# Patient Record
Sex: Female | Born: 1950 | Race: Black or African American | Hispanic: No | Marital: Single | Smoking: Former smoker
Health system: Southern US, Community
[De-identification: ages and names within clinical notes are randomized; demographics above are authoritative.]

## PROBLEM LIST (undated history)

## (undated) DIAGNOSIS — F419 Anxiety disorder, unspecified: Secondary | ICD-10-CM

## (undated) DIAGNOSIS — E669 Obesity, unspecified: Secondary | ICD-10-CM

## (undated) DIAGNOSIS — H269 Unspecified cataract: Secondary | ICD-10-CM

## (undated) DIAGNOSIS — IMO0002 Reserved for concepts with insufficient information to code with codable children: Secondary | ICD-10-CM

## (undated) DIAGNOSIS — Z8659 Personal history of other mental and behavioral disorders: Secondary | ICD-10-CM

## (undated) DIAGNOSIS — N179 Acute kidney failure, unspecified: Secondary | ICD-10-CM

## (undated) DIAGNOSIS — K219 Gastro-esophageal reflux disease without esophagitis: Secondary | ICD-10-CM

## (undated) DIAGNOSIS — E7800 Pure hypercholesterolemia, unspecified: Secondary | ICD-10-CM

## (undated) DIAGNOSIS — R112 Nausea with vomiting, unspecified: Secondary | ICD-10-CM

## (undated) DIAGNOSIS — E1110 Type 2 diabetes mellitus with ketoacidosis without coma: Secondary | ICD-10-CM

## (undated) DIAGNOSIS — M8580 Other specified disorders of bone density and structure, unspecified site: Secondary | ICD-10-CM

## (undated) DIAGNOSIS — R519 Headache, unspecified: Secondary | ICD-10-CM

## (undated) DIAGNOSIS — M1990 Unspecified osteoarthritis, unspecified site: Secondary | ICD-10-CM

## (undated) DIAGNOSIS — I10 Essential (primary) hypertension: Secondary | ICD-10-CM

## (undated) DIAGNOSIS — N6092 Unspecified benign mammary dysplasia of left breast: Secondary | ICD-10-CM

## (undated) DIAGNOSIS — R51 Headache: Secondary | ICD-10-CM

## (undated) DIAGNOSIS — Z9889 Other specified postprocedural states: Secondary | ICD-10-CM

## (undated) HISTORY — PX: CATARACT EXTRACTION, BILATERAL: SHX1313

## (undated) HISTORY — PX: OTHER SURGICAL HISTORY: SHX169

## (undated) HISTORY — PX: ABDOMINAL HYSTERECTOMY: SHX81

## (undated) HISTORY — PX: KNEE JOINT MANIPULATION: SHX386

## (undated) HISTORY — PX: JOINT REPLACEMENT: SHX530

---

## 2013-05-07 DIAGNOSIS — G629 Polyneuropathy, unspecified: Secondary | ICD-10-CM

## 2013-05-07 HISTORY — DX: Polyneuropathy, unspecified: G62.9

## 2013-05-21 ENCOUNTER — Encounter (HOSPITAL_COMMUNITY): Payer: Self-pay | Admitting: Emergency Medicine

## 2013-05-21 ENCOUNTER — Inpatient Hospital Stay (HOSPITAL_COMMUNITY)
Admission: EM | Admit: 2013-05-21 | Discharge: 2013-05-24 | DRG: 638 | Disposition: A | Discharge status: Home Health | Payer: Medicare Other | Attending: Internal Medicine | Admitting: Internal Medicine

## 2013-05-21 DIAGNOSIS — E1310 Other specified diabetes mellitus with ketoacidosis without coma: Secondary | ICD-10-CM

## 2013-05-21 DIAGNOSIS — M129 Arthropathy, unspecified: Secondary | ICD-10-CM | POA: Diagnosis present

## 2013-05-21 DIAGNOSIS — E669 Obesity, unspecified: Secondary | ICD-10-CM | POA: Diagnosis present

## 2013-05-21 DIAGNOSIS — N179 Acute kidney failure, unspecified: Secondary | ICD-10-CM

## 2013-05-21 DIAGNOSIS — E785 Hyperlipidemia, unspecified: Secondary | ICD-10-CM | POA: Diagnosis present

## 2013-05-21 DIAGNOSIS — E876 Hypokalemia: Secondary | ICD-10-CM

## 2013-05-21 DIAGNOSIS — Z87891 Personal history of nicotine dependence: Secondary | ICD-10-CM

## 2013-05-21 DIAGNOSIS — E1110 Type 2 diabetes mellitus with ketoacidosis without coma: Secondary | ICD-10-CM

## 2013-05-21 DIAGNOSIS — M62838 Other muscle spasm: Secondary | ICD-10-CM | POA: Diagnosis present

## 2013-05-21 DIAGNOSIS — K219 Gastro-esophageal reflux disease without esophagitis: Secondary | ICD-10-CM | POA: Diagnosis present

## 2013-05-21 DIAGNOSIS — Z6831 Body mass index (BMI) 31.0-31.9, adult: Secondary | ICD-10-CM

## 2013-05-21 DIAGNOSIS — I10 Essential (primary) hypertension: Secondary | ICD-10-CM

## 2013-05-21 DIAGNOSIS — E871 Hypo-osmolality and hyponatremia: Secondary | ICD-10-CM

## 2013-05-21 DIAGNOSIS — A599 Trichomoniasis, unspecified: Secondary | ICD-10-CM

## 2013-05-21 HISTORY — DX: Gastro-esophageal reflux disease without esophagitis: K21.9

## 2013-05-21 HISTORY — DX: Type 2 diabetes mellitus with ketoacidosis without coma: E11.10

## 2013-05-21 HISTORY — DX: Reserved for concepts with insufficient information to code with codable children: IMO0002

## 2013-05-21 HISTORY — DX: Acute kidney failure, unspecified: N17.9

## 2013-05-21 HISTORY — DX: Essential (primary) hypertension: I10

## 2013-05-21 HISTORY — DX: Unspecified osteoarthritis, unspecified site: M19.90

## 2013-05-21 HISTORY — DX: Pure hypercholesterolemia, unspecified: E78.00

## 2013-05-21 LAB — COMPREHENSIVE METABOLIC PANEL
ALT: 154 U/L — ABNORMAL HIGH (ref 0–35)
AST: 43 U/L — ABNORMAL HIGH (ref 0–37)
Albumin: 4.7 g/dL (ref 3.5–5.2)
Alkaline Phosphatase: 151 U/L — ABNORMAL HIGH (ref 39–117)
BUN: 32 mg/dL — AB (ref 6–23)
CHLORIDE: 75 meq/L — AB (ref 96–112)
CO2: 20 mEq/L (ref 19–32)
Calcium: 9.4 mg/dL (ref 8.4–10.5)
Creatinine, Ser: 1.19 mg/dL — ABNORMAL HIGH (ref 0.50–1.10)
GFR calc Af Amer: 56 mL/min — ABNORMAL LOW (ref >90)
GFR calc non Af Amer: 48 mL/min — ABNORMAL LOW (ref >90)
Glucose, Bld: 863 mg/dL (ref 70–99)
Potassium: 3.8 mEq/L (ref 3.7–5.3)
Sodium: 123 mEq/L — ABNORMAL LOW (ref 137–147)
Total Bilirubin: 0.6 mg/dL (ref 0.3–1.2)
Total Protein: 7.8 g/dL (ref 6.0–8.3)

## 2013-05-21 LAB — BASIC METABOLIC PANEL
BUN: 22 mg/dL (ref 6–23)
BUN: 21 mg/dL (ref 6–23)
BUN: 19 mg/dL (ref 6–23)
CALCIUM: 9 mg/dL (ref 8.4–10.5)
CALCIUM: 9.1 mg/dL (ref 8.4–10.5)
CHLORIDE: 89 meq/L — AB (ref 96–112)
CO2: 23 meq/L (ref 19–32)
CO2: 24 meq/L (ref 19–32)
CO2: 25 mEq/L (ref 19–32)
CREATININE: 0.77 mg/dL (ref 0.50–1.10)
Calcium: 9.1 mg/dL (ref 8.4–10.5)
Chloride: 90 mEq/L — ABNORMAL LOW (ref 96–112)
Chloride: 93 mEq/L — ABNORMAL LOW (ref 96–112)
Creatinine, Ser: 0.86 mg/dL (ref 0.50–1.10)
Creatinine, Ser: 0.74 mg/dL (ref 0.50–1.10)
GFR calc Af Amer: 83 mL/min — ABNORMAL LOW (ref >90)
GFR calc Af Amer: >90 mL/min (ref >90)
GFR calc Af Amer: >90 mL/min (ref >90)
GFR calc non Af Amer: 71 mL/min — ABNORMAL LOW (ref >90)
GFR, EST NON AFRICAN AMERICAN: 89 mL/min — AB (ref >90)
GFR, EST NON AFRICAN AMERICAN: 90 mL/min — AB (ref >90)
GLUCOSE: 142 mg/dL — AB (ref 70–99)
Glucose, Bld: 335 mg/dL — ABNORMAL HIGH (ref 70–99)
Glucose, Bld: 264 mg/dL — ABNORMAL HIGH (ref 70–99)
Potassium: 3.3 mEq/L — ABNORMAL LOW (ref 3.7–5.3)
Potassium: 3.2 mEq/L — ABNORMAL LOW (ref 3.7–5.3)
Potassium: 3.1 mEq/L — ABNORMAL LOW (ref 3.7–5.3)
Sodium: 131 mEq/L — ABNORMAL LOW (ref 137–147)
Sodium: 132 mEq/L — ABNORMAL LOW (ref 137–147)
Sodium: 133 mEq/L — ABNORMAL LOW (ref 137–147)

## 2013-05-21 LAB — URINALYSIS, ROUTINE W REFLEX MICROSCOPIC
Bilirubin Urine: NEGATIVE
Glucose, UA: >1000 mg/dL — AB
Ketones, ur: 40 mg/dL — AB
LEUKOCYTES UA: NEGATIVE
Nitrite: NEGATIVE
PROTEIN: NEGATIVE mg/dL
Specific Gravity, Urine: 1.029 (ref 1.005–1.030)
Urobilinogen, UA: 0.2 mg/dL (ref 0.0–1.0)
pH: 5.5 (ref 5.0–8.0)

## 2013-05-21 LAB — CBC
HEMATOCRIT: 40.2 % (ref 36.0–46.0)
HEMOGLOBIN: 13.8 g/dL (ref 12.0–15.0)
MCH: 29.4 pg (ref 26.0–34.0)
MCHC: 34.3 g/dL (ref 30.0–36.0)
MCV: 85.5 fL (ref 78.0–100.0)
Platelets: 148 10*3/uL — ABNORMAL LOW (ref 150–400)
RBC: 4.7 MIL/uL (ref 3.87–5.11)
RDW: 12.6 % (ref 11.5–15.5)
WBC: 7.1 10*3/uL (ref 4.0–10.5)

## 2013-05-21 LAB — URINE MICROSCOPIC-ADD ON

## 2013-05-21 LAB — GLUCOSE, CAPILLARY
GLUCOSE-CAPILLARY: 518 mg/dL — AB (ref 70–99)
GLUCOSE-CAPILLARY: 287 mg/dL — AB (ref 70–99)
GLUCOSE-CAPILLARY: 250 mg/dL — AB (ref 70–99)
Glucose-Capillary: 141 mg/dL — ABNORMAL HIGH (ref 70–99)
Glucose-Capillary: 143 mg/dL — ABNORMAL HIGH (ref 70–99)
Glucose-Capillary: 157 mg/dL — ABNORMAL HIGH (ref 70–99)
Glucose-Capillary: 179 mg/dL — ABNORMAL HIGH (ref 70–99)
Glucose-Capillary: 325 mg/dL — ABNORMAL HIGH (ref 70–99)
Glucose-Capillary: 435 mg/dL — ABNORMAL HIGH (ref 70–99)
Glucose-Capillary: >600 mg/dL (ref 70–99)

## 2013-05-21 LAB — POCT I-STAT, CHEM 8
BUN: 30 mg/dL — ABNORMAL HIGH (ref 6–23)
CALCIUM ION: 1.11 mmol/L — AB (ref 1.13–1.30)
Chloride: 84 mEq/L — ABNORMAL LOW (ref 96–112)
Creatinine, Ser: 1.4 mg/dL — ABNORMAL HIGH (ref 0.50–1.10)
HCT: 50 % — ABNORMAL HIGH (ref 36.0–46.0)
Hemoglobin: 17 g/dL — ABNORMAL HIGH (ref 12.0–15.0)
Potassium: 3.7 mEq/L (ref 3.7–5.3)
Sodium: 124 mEq/L — ABNORMAL LOW (ref 137–147)
TCO2: 24 mmol/L (ref 0–100)

## 2013-05-21 LAB — BLOOD GAS, ARTERIAL
Acid-base deficit: 1.8 mmol/L (ref 0.0–2.0)
Bicarbonate: 20.9 mEq/L (ref 20.0–24.0)
Drawn by: 331471
O2 SAT: 98.3 %
PH ART: 7.445 (ref 7.350–7.450)
Patient temperature: 98.6
TCO2: 18.1 mmol/L (ref 0–100)
pCO2 arterial: 30.9 mmHg — ABNORMAL LOW (ref 35.0–45.0)
pO2, Arterial: 111 mmHg — ABNORMAL HIGH (ref 80.0–100.0)

## 2013-05-21 LAB — CG4 I-STAT (LACTIC ACID): Lactic Acid, Venous: 3.04 mmol/L — ABNORMAL HIGH (ref 0.5–2.2)

## 2013-05-21 MED ORDER — SODIUM CHLORIDE 0.9 % IV SOLN
INTRAVENOUS | Status: DC
  Administered 2013-05-21: 15:00:00 via INTRAVENOUS

## 2013-05-21 MED ORDER — SODIUM CHLORIDE 0.9 % IV BOLUS (SEPSIS)
1000.0000 mL | Freq: Once | INTRAVENOUS | Status: AC
  Administered 2013-05-21: 1000 mL via INTRAVENOUS

## 2013-05-21 MED ORDER — DEXTROSE 50 % IV SOLN: 25.0000 mL | INTRAVENOUS | Status: DC | PRN

## 2013-05-21 MED ORDER — INSULIN REGULAR HUMAN 100 UNIT/ML IJ SOLN
INTRAMUSCULAR | Status: DC
  Administered 2013-05-21: 14:00:00 via INTRAVENOUS
  Filled 2013-05-21: qty 1

## 2013-05-21 MED ORDER — DEXTROSE-NACL 5-0.45 % IV SOLN
INTRAVENOUS | Status: DC
  Administered 2013-05-21 – 2013-05-22 (×2): via INTRAVENOUS

## 2013-05-21 MED ORDER — ONDANSETRON HCL 4 MG/2ML IJ SOLN
4.0000 mg | Freq: Four times a day (QID) | INTRAMUSCULAR | Status: DC | PRN
Start: 2013-05-21 — End: 2013-05-24

## 2013-05-21 MED ORDER — PANTOPRAZOLE SODIUM 40 MG PO TBEC
40.0000 mg | DELAYED_RELEASE_TABLET | Freq: Every day | ORAL | Status: DC
Start: 2013-05-22 — End: 2013-05-24
  Administered 2013-05-22 – 2013-05-24 (×3): 40 mg via ORAL
  Filled 2013-05-21 (×3): qty 1

## 2013-05-21 MED ORDER — INSULIN GLARGINE 100 UNIT/ML ~~LOC~~ SOLN
30.0000 [IU] | Freq: Every day | SUBCUTANEOUS | Status: DC
  Administered 2013-05-21 – 2013-05-22 (×2): 30 [IU] via SUBCUTANEOUS
  Filled 2013-05-21 (×3): qty 0.3

## 2013-05-21 MED ORDER — ALUM & MAG HYDROXIDE-SIMETH 200-200-20 MG/5ML PO SUSP
15.0000 mL | ORAL | Status: DC | PRN
  Administered 2013-05-23: 15 mL via ORAL
  Filled 2013-05-21: qty 30

## 2013-05-21 MED ORDER — ONDANSETRON HCL 4 MG PO TABS: 4.0000 mg | ORAL_TABLET | Freq: Four times a day (QID) | ORAL | Status: DC | PRN

## 2013-05-21 MED ORDER — POTASSIUM CHLORIDE CRYS ER 20 MEQ PO TBCR
40.0000 meq | EXTENDED_RELEASE_TABLET | Freq: Once | ORAL | Status: AC
  Administered 2013-05-21: 40 meq via ORAL
  Filled 2013-05-21: qty 2

## 2013-05-21 MED ORDER — INSULIN REGULAR BOLUS VIA INFUSION
0.0000 [IU] | Freq: Three times a day (TID) | INTRAVENOUS | Status: DC
  Filled 2013-05-21: qty 10

## 2013-05-21 MED ORDER — METRONIDAZOLE 500 MG PO TABS
2000.0000 mg | ORAL_TABLET | Freq: Once | ORAL | Status: AC
  Administered 2013-05-22: 2000 mg via ORAL
  Filled 2013-05-21: qty 4

## 2013-05-21 MED ORDER — ENOXAPARIN SODIUM 40 MG/0.4ML ~~LOC~~ SOLN
40.0000 mg | SUBCUTANEOUS | Status: DC
  Administered 2013-05-21 – 2013-05-23 (×3): 40 mg via SUBCUTANEOUS
  Filled 2013-05-21 (×4): qty 0.4

## 2013-05-21 MED ORDER — ACETAMINOPHEN 325 MG PO TABS: 650.0000 mg | ORAL_TABLET | Freq: Four times a day (QID) | ORAL | Status: DC | PRN

## 2013-05-21 MED ORDER — ACETAMINOPHEN 650 MG RE SUPP: 650.0000 mg | Freq: Four times a day (QID) | RECTAL | Status: DC | PRN

## 2013-05-21 MED ORDER — SIMVASTATIN 20 MG PO TABS
20.0000 mg | ORAL_TABLET | Freq: Every day | ORAL | Status: DC
  Administered 2013-05-22 – 2013-05-24 (×3): 20 mg via ORAL
  Filled 2013-05-21 (×3): qty 1

## 2013-05-21 NOTE — ED Notes (Signed)
MD at bedside.  Rama MD

## 2013-05-21 NOTE — Progress Notes (Signed)
Pt's last cbg is 141. On call MD notified for insulin and diet orders. Will cont to monitor.

## 2013-05-21 NOTE — ED Provider Notes (Signed)
CSN: [id number]     [id number] date & time 05/21/13  1145 History   First MD Initiated Contact with Patient 05/21/13 1210     Chief Complaint  Patient presents with  . Hand Cramps    (Consider location/radiation/quality/duration/timing/severity/associated sxs/prior Treatment) HPI Comments: Patient is a 63 year old female with past medical history of hypertension, arthritis and hyperlipidemia who presents to the emergency department complaining of severe left hand cramping that woke her up from sleep around 2:00 this morning. He should states her hand has been cramping at random on and off throughout the day, when the cramping is present she has severe nonradiating pain. Nothing in specific causes her hand to cramp. Denies any associated symptoms, denies fever, chills, chest pain, shortness of breath, numbness or tingling. States she has been very thirsty lately, especially since she had a tooth pulled from the left lower side this past Friday. She has been on clindamycin which she stopped because she states it was making her thirsty. Denies ever having symptoms like this in the past. No hx of diabetes. Denies any new medications.  The history is provided by the patient.    Past Medical History  Diagnosis Date  . Hypertension   . Arthritis   . High cholesterol    Past Surgical History  Procedure Laterality Date  . Abdominal hysterectomy     History reviewed. No pertinent family history. History  Substance Use Topics  . Smoking status: Former Smoker    Quit date: 05/07/2013  . Smokeless tobacco: Never Used  . Alcohol Use: Yes     Comment: occ   OB History   Grav Para Term Preterm Abortions TAB SAB Ect Mult Living                 Review of Systems  Musculoskeletal:       Positive for left hand cramping and pain.  All other systems reviewed and are negative.    Allergies  Review of patient's allergies indicates not on file.  Home Medications  No current outpatient  prescriptions on file. BP 123/74  Pulse 89  Temp(Src) 97.6 F (36.4 C) (Oral)  Resp 16  SpO2 96% Physical Exam  Nursing note and vitals reviewed. Constitutional: She is oriented to person, place, and time. She appears well-developed and well-nourished. No distress.  HENT:  Head: Normocephalic and atraumatic.  Mouth/Throat: Mucous membranes are dry.    Eyes: Conjunctivae are normal.  Neck: Normal range of motion. Neck supple.  Cardiovascular: Normal rate, regular rhythm and normal heart sounds.   Pulmonary/Chest: Effort normal and breath sounds normal.  Musculoskeletal: Normal range of motion. She exhibits no edema.  Left hand with uncontrollable contracture of fingers, intermittent throughout exam. Full ROM, non-tender, no swelling or deformity.  Neurological: She is alert and oriented to person, place, and time. She has normal strength. No sensory deficit.  Skin: Skin is warm and dry. She is not diaphoretic.  Psychiatric: She has a normal mood and affect. Her behavior is normal.    ED Course  Procedures (including critical care time) CRITICAL CARE Performed by: Johnnette GourdAlbert, Arita Severtson   Total critical care time: 30 minutes  Critical care time was exclusive of separately billable procedures and treating other patients.  Critical care was necessary to treat or prevent imminent or life-threatening deterioration.  Critical care was time spent personally by me on the following activities: development of treatment plan with patient and/or surrogate as well as nursing, discussions with consultants, evaluation of patient's response  to treatment, examination of patient, obtaining history from patient or surrogate, ordering and performing treatments and interventions, ordering and review of laboratory studies, ordering and review of radiographic studies, pulse oximetry and re-evaluation of patient's condition.  Labs Review Labs Reviewed  CBC - Abnormal; Notable for the following:    Platelets  148 (*)    All other components within normal limits  GLUCOSE, CAPILLARY - Abnormal; Notable for the following:    Glucose-Capillary >600 (*)    All other components within normal limits  COMPREHENSIVE METABOLIC PANEL - Abnormal; Notable for the following:    Sodium 123 (*)    Chloride 75 (*)    Glucose, Bld 863 (*)    BUN 32 (*)    Creatinine, Ser 1.19 (*)    AST 43 (*)    ALT 154 (*)    Alkaline Phosphatase 151 (*)    GFR calc non Af Amer 48 (*)    GFR calc Af Amer 56 (*)    All other components within normal limits  URINALYSIS, ROUTINE W REFLEX MICROSCOPIC - Abnormal; Notable for the following:    APPearance CLOUDY (*)    Glucose, UA >1000 (*)    Hgb urine dipstick TRACE (*)    Ketones, ur 40 (*)    All other components within normal limits  POCT I-STAT, CHEM 8 - Abnormal; Notable for the following:    Sodium 124 (*)    Chloride 84 (*)    BUN 30 (*)    Creatinine, Ser 1.40 (*)    Glucose, Bld >700 (*)    Calcium, Ion 1.11 (*)    Hemoglobin 17.0 (*)    HCT 50.0 (*)    All other components within normal limits  CG4 I-STAT (LACTIC ACID) - Abnormal; Notable for the following:    Lactic Acid, Venous 3.04 (*)    All other components within normal limits  URINE MICROSCOPIC-ADD ON  BLOOD GAS, ARTERIAL   Imaging Review No results found.  EKG Interpretation   None       MDM   1. DKA (diabetic ketoacidoses)     Pt presenting with left hand contracture, intermittent. No associated symptoms. States she has has increased thirst. No hx of diabetes. Possible electrolyte abnormality vs DM. Labs pending- cbc, bmp, cbg. 12:35 PM CBG >600. Will check CMP, give IV fluids. 1:31 PM Glucose on chem 8 >700. Lactate 3.04. Pt will be placed on glucostabilizer. Plan to admit pt. Case discussed with attending Dr. Micheline Maze who agrees with plan of care. 2:38 PM Pt in DKA. Anion gap 28. Pt will be admitted, admission accepted by Dr. Darnelle Catalan, [facility].  Trevor Mace, PA-C 05/21/13 1439

## 2013-05-21 NOTE — ED Notes (Signed)
Paged Dr. Darnelle Catalanama about reason for FLagyl. Pt also doesn't want any more blood drawn.

## 2013-05-21 NOTE — ED Notes (Signed)
Pt is resting comfortably with family at bedside.  

## 2013-05-21 NOTE — ED Notes (Signed)
Pt c/o intermittent L hand cramping starting at 2am this morning.  Denies pain.

## 2013-05-21 NOTE — ED Notes (Signed)
Patient refused lab draw at this time. RN to page hospitalist.

## 2013-05-21 NOTE — H&P (Addendum)
[facility] History and Physical  Connie Branch OIT:[id number] DOB: 1951-01-01 DOA: 05/21/2013  Referring physician: Toy Cookey, MD PCP: Neldon Labella, MD   Chief Complaint: Hand cramps   History of Present Illness: Connie Branch is an 63 y.o. female with a PMH of HTN, HLD, but no known history of DM who presented to the ER with a chief complaint of polydipsia and hand cramps.  The patient tells me that she has had a 1 month history of worsening polydipsia and polyuria.  The patient thought that the polyuria was related to taking a "water pill" (although she has been on this for "a couple of years").  She also was recently treated for a dental infection with Clindamycin but had her tooth pulled on Friday.Stopped taking the Clindamycin because of nausea and diarrhea.  Had a cortisone shot in both knees on Monday.  Review of Systems: Constitutional: No fever, no chills;  Appetite diminished; No weight loss, no weight gain, + fatigue.  HEENT: No blurry vision, no diplopia, no pharyngitis, no dysphagia CV: No chest pain, no palpitations, no PND.  Resp: No SOB, no cough, no pleuritic pain. GI: + nausea, no vomiting, no current diarrhea, no melena, no hematochezia, no constipation.  GU: No dysuria, no hematuria, + frequency, no urgency. MSK: no myalgias, no arthralgias.  Neuro:  No headache, no focal neurological deficits, no history of seizures.  Psych: No depression, no anxiety.  Endo: No heat intolerance, no cold intolerance, + polyuria, + polydipsia  Skin: No rashes, no skin lesions.  Heme: No easy bruising.  Travel history: None in past 6 months.  Past Medical History Past Medical History  Diagnosis Date  . Hypertension   . Arthritis   . High cholesterol   . GERD (gastroesophageal reflux disease)   . DDD (degenerative disc disease)      Past Surgical History Past Surgical History  Procedure Laterality Date  . Abdominal hysterectomy       Social History: History    Social History  . Marital Status: Single    Spouse Name: N/A    Number of Children: 0  . Years of Education: N/A   Occupational History  . Disabled.    Social History Main Topics  . Smoking status: Former Smoker    Quit date: 05/07/2013  . Smokeless tobacco: Never Used  . Alcohol Use: Yes     Comment: Occassional.  . Drug Use: No  . Sexual Activity: Not on file   Other Topics Concern  . Not on file   Social History Narrative   Single.  Lives with family.    Family History:  Family History  Problem Relation Age of Onset  . Cancer Brother     Allergies: Zithromax; Aspirin; and Penicillins  Meds: Prior to Admission medications   Medication Sig Start Date End Date Taking? Authorizing Provider  Calcium Carbonate-Simethicone (MAALOX MAX PO) Take 5 mLs by mouth daily as needed (acid).   Yes Historical Provider, MD  clindamycin (CLEOCIN) 300 MG capsule Take 300 mg by mouth every 6 (six) hours. 04/24/13  Yes Historical Provider, MD  esomeprazole (NEXIUM) 20 MG capsule Take 20 mg by mouth daily at 12 noon.   Yes Historical Provider, MD  losartan-hydrochlorothiazide (HYZAAR) 100-25 MG per tablet Take 1 tablet by mouth daily.   Yes Historical Provider, MD  simvastatin (ZOCOR) 20 MG tablet Take 20 mg by mouth daily.   Yes Historical Provider, MD    Physical Exam: Filed Vitals:   05/21/13  1202  BP: 123/74  Pulse: 89  Temp: 97.6 F (36.4 C)  TempSrc: Oral  Resp: 16  SpO2: 96%     Physical Exam: Blood pressure 123/74, pulse 89, temperature 97.6 F (36.4 C), temperature source Oral, resp. rate 16, SpO2 96.00%. Gen: No acute distress. Head: Normocephalic, atraumatic. Eyes: PERRL, EOMI, sclerae nonicteric. Mouth: Oropharynx clear with poor dentition. Neck: Supple, no thyromegaly, no lymphadenopathy, no jugular venous distention. Chest: Lungs CTAB. CV: Heart sounds are regular.  No M/R/G. Abdomen: Soft, nontender, nondistended with normal active bowel  sounds. Extremities: Extremities without C/E/C. Skin: Warm and dry. Neuro: Alert and oriented times 3; cranial nerves II through XII grossly intact. Psych: Mood and affect anxious.  Labs on Admission:  Basic Metabolic Panel:  Recent Labs Lab 05/21/13 1251 05/21/13 1307  NA 123* 124*  K 3.8 3.7  CL 75* 84*  CO2 20  --   GLUCOSE 863* >700*  BUN 32* 30*  CREATININE 1.19* 1.40*  CALCIUM 9.4  --    Liver Function Tests:  Recent Labs Lab 05/21/13 1251  AST 43*  ALT 154*  ALKPHOS 151*  BILITOT 0.6  PROT 7.8  ALBUMIN 4.7   CBC:  Recent Labs Lab 05/21/13 1251 05/21/13 1307  WBC 7.1  --   HGB 13.8 17.0*  HCT 40.2 50.0*  MCV 85.5  --   PLT 148*  --   CBG:  Recent Labs Lab 05/21/13 1227 05/21/13 1451  GLUCAP >600* 518*    Radiological Exams on Admission: No results found.    Assessment/Plan Principal Problem:   DKA (diabetic ketoacidoses) DKA or HHS -Start/continue aggressive fluid replacement to correct both hypovolemia and hyperosmolality with NS at 150 cc/hr. Add dextrose to the saline solution when the serum glucose reaches < 250. -Start an insulin drip per glucommander protocol to correct hyperglycemia. -Monitor CBGs hourly until stable. -Monitor BMET Q 4 hours. -Transition to basal/bolus insulin when the ketoacidosis has resolved and the patient is able to eat. Continue IV insulin infusion for one to two hours after initiating the SQ insulin, to avoid recurrent hyperglycemia. -Replacement potassium given complaints of muscle spasm, as potassium likely to fall as DKA resolves. Active Problems:   Muscle spasm Likely from electrolyte shifts.  Supplement potassium.   Hyponatremia Likely reflective of hyperglycemia.  Should correct with correction of glucose.   ARF (acute renal failure) secondary to dehydration from osmotic diuresis Hydrate and monitor.  Hold ARB / HCTZ.   HTN (hypertension) Hold home medications given ARF.   Hyperlipidemia Continue  Zocor.  Check FLP in a.m.   GERD (gastroesophageal reflux disease) Continue PPI.   Trichomonas Check GC and chlamydia probes.  Check HIV.  Give Flagyl 2 grams PO x 1.   Code Status: Full. Family Communication: Braxton FeathersMarie Pierce (friend). Disposition Plan: Home when stable.  Time spent: 70 minutes.  RAMA,CHRISTINA [facility] Pager [phone]  If 7PM-7AM, please contact night-coverage [email or web address] Password [email or web address] 05/21/2013, 3:18 PM

## 2013-05-21 NOTE — ED Notes (Signed)
Pt reports she has been thirsty and reports urinary frequency lately. No hx of DM.  Pt also reports L hand intermittent cramping.

## 2013-05-21 NOTE — ED Provider Notes (Signed)
Medical screening examination/treatment/procedure(s) were performed by non-physician practitioner and as supervising physician I was immediately available for consultation/collaboration.  EKG Interpretation   None         Megan E Docherty, MD 05/21/13 1616 

## 2013-05-22 DIAGNOSIS — A599 Trichomoniasis, unspecified: Secondary | ICD-10-CM

## 2013-05-22 DIAGNOSIS — E669 Obesity, unspecified: Secondary | ICD-10-CM | POA: Diagnosis present

## 2013-05-22 DIAGNOSIS — E876 Hypokalemia: Secondary | ICD-10-CM | POA: Diagnosis present

## 2013-05-22 LAB — BASIC METABOLIC PANEL
BUN: 18 mg/dL (ref 6–23)
BUN: 16 mg/dL (ref 6–23)
CO2: 26 mEq/L (ref 19–32)
CO2: 24 mEq/L (ref 19–32)
CREATININE: 0.77 mg/dL (ref 0.50–1.10)
Calcium: 8.7 mg/dL (ref 8.4–10.5)
Calcium: 8.4 mg/dL (ref 8.4–10.5)
Chloride: 93 mEq/L — ABNORMAL LOW (ref 96–112)
Chloride: 94 mEq/L — ABNORMAL LOW (ref 96–112)
Creatinine, Ser: 0.75 mg/dL (ref 0.50–1.10)
GFR calc Af Amer: >90 mL/min (ref >90)
GFR calc non Af Amer: 89 mL/min — ABNORMAL LOW (ref >90)
GFR calc non Af Amer: 90 mL/min — ABNORMAL LOW (ref >90)
Glucose, Bld: 126 mg/dL — ABNORMAL HIGH (ref 70–99)
Glucose, Bld: 200 mg/dL — ABNORMAL HIGH (ref 70–99)
POTASSIUM: 3.3 meq/L — AB (ref 3.7–5.3)
Potassium: 3.8 mEq/L (ref 3.7–5.3)
Sodium: 134 mEq/L — ABNORMAL LOW (ref 137–147)
Sodium: 133 mEq/L — ABNORMAL LOW (ref 137–147)

## 2013-05-22 LAB — LIPID PANEL
CHOLESTEROL: 132 mg/dL (ref 0–200)
HDL: 52 mg/dL (ref >39)
LDL Cholesterol: 53 mg/dL (ref 0–99)
TRIGLYCERIDES: 137 mg/dL (ref <150)
Total CHOL/HDL Ratio: 2.5 RATIO
VLDL: 27 mg/dL (ref 0–40)

## 2013-05-22 LAB — GLUCOSE, CAPILLARY
GLUCOSE-CAPILLARY: 125 mg/dL — AB (ref 70–99)
GLUCOSE-CAPILLARY: 158 mg/dL — AB (ref 70–99)
GLUCOSE-CAPILLARY: 314 mg/dL — AB (ref 70–99)
GLUCOSE-CAPILLARY: 353 mg/dL — AB (ref 70–99)
Glucose-Capillary: 129 mg/dL — ABNORMAL HIGH (ref 70–99)
Glucose-Capillary: 133 mg/dL — ABNORMAL HIGH (ref 70–99)
Glucose-Capillary: 174 mg/dL — ABNORMAL HIGH (ref 70–99)
Glucose-Capillary: 192 mg/dL — ABNORMAL HIGH (ref 70–99)
Glucose-Capillary: 166 mg/dL — ABNORMAL HIGH (ref 70–99)
Glucose-Capillary: 315 mg/dL — ABNORMAL HIGH (ref 70–99)

## 2013-05-22 LAB — CBC
HCT: 36.7 % (ref 36.0–46.0)
HEMOGLOBIN: 13.3 g/dL (ref 12.0–15.0)
MCH: 30.5 pg (ref 26.0–34.0)
MCHC: 36.2 g/dL — AB (ref 30.0–36.0)
MCV: 84.2 fL (ref 78.0–100.0)
Platelets: 149 10*3/uL — ABNORMAL LOW (ref 150–400)
RBC: 4.36 MIL/uL (ref 3.87–5.11)
RDW: 12.5 % (ref 11.5–15.5)
WBC: 7.5 10*3/uL (ref 4.0–10.5)

## 2013-05-22 LAB — HIV ANTIBODY (ROUTINE TESTING W REFLEX): HIV: NONREACTIVE

## 2013-05-22 LAB — HEMOGLOBIN A1C
HEMOGLOBIN A1C: 14.5 % — AB (ref <5.7)
MEAN PLASMA GLUCOSE: 369 mg/dL — AB (ref <117)

## 2013-05-22 MED ORDER — INSULIN ASPART 100 UNIT/ML ~~LOC~~ SOLN
0.0000 [IU] | Freq: Three times a day (TID) | SUBCUTANEOUS | Status: DC
  Administered 2013-05-22 – 2013-05-23 (×3): 15 [IU] via SUBCUTANEOUS
  Administered 2013-05-23: 20 [IU] via SUBCUTANEOUS
  Administered 2013-05-24: 7 [IU] via SUBCUTANEOUS
  Administered 2013-05-24: 15 [IU] via SUBCUTANEOUS
  Administered 2013-05-24: 7 [IU] via SUBCUTANEOUS

## 2013-05-22 MED ORDER — POTASSIUM CHLORIDE 10 MEQ/100ML IV SOLN
10.0000 meq | INTRAVENOUS | Status: AC
  Administered 2013-05-22 (×2): 10 meq via INTRAVENOUS
  Filled 2013-05-22 (×3): qty 100

## 2013-05-22 MED ORDER — POTASSIUM CHLORIDE CRYS ER 20 MEQ PO TBCR
20.0000 meq | EXTENDED_RELEASE_TABLET | Freq: Once | ORAL | Status: AC
  Administered 2013-05-22: 20 meq via ORAL
  Filled 2013-05-22: qty 1

## 2013-05-22 MED ORDER — INSULIN ASPART 100 UNIT/ML ~~LOC~~ SOLN
0.0000 [IU] | Freq: Three times a day (TID) | SUBCUTANEOUS | Status: DC
  Administered 2013-05-22: 3 [IU] via SUBCUTANEOUS
  Administered 2013-05-22: 15 [IU] via SUBCUTANEOUS

## 2013-05-22 MED ORDER — POTASSIUM CHLORIDE 10 MEQ/100ML IV SOLN
10.0000 meq | INTRAVENOUS | Status: AC
  Administered 2013-05-22: 10 meq via INTRAVENOUS
  Filled 2013-05-22 (×2): qty 100

## 2013-05-22 MED ORDER — INSULIN ASPART 100 UNIT/ML ~~LOC~~ SOLN
0.0000 [IU] | Freq: Every day | SUBCUTANEOUS | Status: DC
  Administered 2013-05-22: 4 [IU] via SUBCUTANEOUS

## 2013-05-22 MED ORDER — LIVING WELL WITH DIABETES BOOK
Freq: Once | Status: AC
  Administered 2013-05-22: 09:00:00
  Filled 2013-05-22: qty 1

## 2013-05-22 MED ORDER — INSULIN ASPART 100 UNIT/ML ~~LOC~~ SOLN
6.0000 [IU] | Freq: Three times a day (TID) | SUBCUTANEOUS | Status: DC
  Administered 2013-05-22 – 2013-05-23 (×3): 6 [IU] via SUBCUTANEOUS

## 2013-05-22 MED ORDER — SODIUM CHLORIDE 0.9 % IV SOLN
INTRAVENOUS | Status: DC
  Administered 2013-05-22: 06:00:00 via INTRAVENOUS

## 2013-05-22 NOTE — Progress Notes (Signed)
Pt complained of pain at IV site after potassium infusion begun. Rate decreased to tolerate infusion. Will cont to monitor.

## 2013-05-22 NOTE — Progress Notes (Signed)
[facility] PROGRESS NOTE   Connie Branch FBP:[id number] DOB: [birth year] DOA: 05/21/2013 PCP: Tawanna Solo, MD  Brief narrative: Connie Branch is an 63 y.o. female with a PMH of HTN, HLD, but no known history of DM who presented to the ER with a chief complaint of polydipsia and hand cramps. Upon initial evaluation in the ED, the patient was found to be in DKA.   Assessment/Plan: Principal Problem:  DKA (diabetic ketoacidoses)/obesity Patient was admitted and aggressively hydrated overnight with Q4 hour electrolyte checks. She was placed on IV insulin. Once her blood glucoses were less than 250, dextrose was added to her IV solution. She was transitioned to basal/bolus insulin which she will met parameters for discontinuation of the insulin drip. Her potassium was aggressively replaced. Sugar is now 133-353, on Lantus 30 units and moderate scale assess size. We'll change to resistant scale and meal coverage. Diabetes coordinator and dietitian consulted. Followup hemoglobin A1c. Nursing staff to teach how to monitor sugars and calculate and administer correct insulin dosage. Active Problems:  Muscle spasm / hypokalemia  Likely from electrolyte shifts and hypokalemia. Improved with potassium supplementation.  Hyponatremia  Likely reflective of hyperglycemia. Should correct with correction of glucose.  ARF (acute renal failure) secondary to dehydration from osmotic diuresis  Resolved with IV fluids. Hold ARB / HCTZ.  HTN (hypertension)  Hold home medications given ARF. Blood pressure not currently elevated. Hyperlipidemia  Continue Zocor. Cholesterol 132, triglycerides 137, HDL 52, and LDL 53, good control.  GERD (gastroesophageal reflux disease)  Continue PPI.  Infection due to Trichomonas  Check GC and chlamydia probes. HIV negative. Give Flagyl 2 grams PO x 1.   Code Status: Full.  Family Communication: No family at bedside.  Disposition Plan: Home when  stable.   IV access:  Peripheral IV  Medical Consultants:  None.  Other Consultants:  Diabetes coordinator  Dietitian  Anti-infectives:  Flagyl x11/16/15  HPI/Subjective: Connie Branch denies nausea, vomiting or abdominal pain. Polyuria and polydipsia have improved. Hand cramps have improved.  Objective: Filed Vitals:   05/21/13 1612 05/21/13 1730 05/21/13 2247 05/22/13 0446  BP: 148/76 127/77 116/66 92/64  Pulse: 92 79 79 83  Temp:   97.8 F (36.6 C) 98.3 F (36.8 C)  TempSrc:   Oral Oral  Resp: _0 Height:  5' 4" (1.626 m)    Weight:  82.918 kg (182 lb 12.8 oz)    SpO2: 100% 100% 100% 99%    Intake/Output Summary (Last 24 hours) at 05/22/13 1316 Last data filed at 05/22/13 1302  Gross per 24 hour  Intake 2074.06 ml  Output      0 ml  Net 2074.06 ml    Exam: Gen:  NAD Cardiovascular:  RRR, No M/R/G Respiratory:  Lungs CTAB Gastrointestinal:  Abdomen soft, NT/ND, + BS Extremities:  No C/E/C  Data Reviewed: Basic Metabolic Panel:  Recent Labs Lab 05/21/13 1746 05/21/13 1912 05/21/13 2257 05/22/13 0219 05/22/13 0543  NA 131* 132* 133* 134* 133*  K 3.3* 3.2* 3.1* 3.3* 3.8  CL 89* 90* 93* 93* 94*  CO2 _1 GLUCOSE 335* 264* 142* 126* 200*  BUN _2 CREATININE 0.86 0.77 0.74 0.77 0.75  CALCIUM 9.1 9.0 9.1 8.7 8.4   GFR Estimated Creatinine Clearance: 76.9 ml/min (by C-G formula based on Cr of 0.75). Liver Function Tests:  Recent Labs Lab 05/21/13 1251  AST 43*  ALT 154*  ALKPHOS 151*  BILITOT  0.6  PROT 7.8  ALBUMIN 4.7    CBC:  Recent Labs Lab 05/21/13 1251 05/21/13 1307 05/22/13 0219  WBC 7.1  --  7.5  HGB 13.8 17.0* 13.3  HCT 40.2 50.0* 36.7  MCV 85.5  --  84.2  PLT 148*  --  149*   CBG:  Recent Labs Lab 05/22/13 0351 05/22/13 0453 05/22/13 0654 05/22/13 0747 05/22/13 1146  GLUCAP 133* 174* 192* 166* 353*   Lipid Profile  Recent Labs  05/22/13 0219  CHOL 132  HDL 52   LDLCALC 53  TRIG 137  CHOLHDL 2.5     Procedures and Diagnostic Studies: No results found.  Scheduled Meds: . enoxaparin (LOVENOX) injection  40 mg Subcutaneous Q24H  . insulin aspart  0-15 Units Subcutaneous TID WC  . insulin glargine  30 Units Subcutaneous QHS  . metroNIDAZOLE  2,000 mg Oral Once  . pantoprazole  40 mg Oral Daily  . simvastatin  20 mg Oral Daily   Continuous Infusions:   Time spent: 35 minutes with > 50% of time discussing current diagnostic test results, clinical impression and plan of care.    LOS: 1 day   Nadav Swindell  [facility] Pager [phone].   *Please note that the hospitalists switch teams on Wednesdays. Please call the flow manager at [phone] if you are having difficulty reaching the hospitalist taking care of this patient as she can update you and provide the most up-to-date pager number of provider caring for the patient. If 8PM-8AM, please contact night-coverage at [email or web address], password [facility]  05/22/2013, 1:16 PM

## 2013-05-22 NOTE — Progress Notes (Signed)
Inpatient Diabetes Program Recommendations  AACE/ADA: New Consensus Statement on Inpatient Glycemic Control (2013)  Target Ranges:  Prepandial:   less than 140 mg/dL      Peak postprandial:   less than 180 mg/dL (1-2 hours)      Critically ill patients:  140 - 180 mg/dL   Results for Connie Branch, Connie Branch (MRN [id number]) as of 05/22/2013 15:30  Ref. Range 05/22/2013 03:51 05/22/2013 04:53 05/22/2013 06:54 05/22/2013 07:47 05/22/2013 11:46  Glucose-Capillary Latest Range: 70-99 mg/dL 133 (H) 174 (H) 192 (H) 166 (H) 353 (H)   Inpatient Diabetes Program Recommendations Insulin - Basal: Please consider increasing Lantus to 35 units QHS.  Note:  Spoke with patient about new diabetes diagnosis. Reviewed Living Well with Diabetes booklet in detail with patient.  Explained what an A1C is, basic pathophysiology of DM Type 2, basic home care, importance of checking CBGs and maintaining good CBG control to prevent long-term and short-term complications. Reviewed signs and symptoms of hyperglycemia and hypoglycemia along with treatment for both.  Asked patient to check blood sugars at least 4 times a day (before meals and at bedtime).  Educated patient on insulin pen use at home. Reviewed all steps of insulin pen including attachment of needle, 2-unit air shot, dialing up dose, giving injection, removing needle, disposal of sharps, storage of unused insulin, disposal of insulin etc. Patient able to provide successful return demonstration.  Patient verbalized understanding of information and is very receptive to gaining knowledge to understand how to take care of herself and get diabetes controlled.  Patient reports that she has no further questions related to diabetes at this time.  MD to give patient RXs for glucometer, insulin pens, and insulin pen needles. RNs to provide ongoing basic DM education at bedside with this patient and engage patient to actively check blood glucose and administer insulin injections. Have  ordered educational booklet, insulin starter kit, and DM videos.  Thanks, Barnie Alderman, RN, MSN, CCRN Diabetes Coordinator Inpatient Diabetes Program [phone] (Team Pager) [phone] ([place] office) [phone] [facility] office)

## 2013-05-22 NOTE — Progress Notes (Signed)
Pt successfully checked her own CBG on hospital's glucometer and self injected her dinnertime dose of insulin with this writer's supervision. Pt encouraged to continue to practice these techniques w/ nursing supervision.  Pt has watched videos 501-504 on Patient Education channel today. Requests to wait until tomorrow to watch remainder as she is feeling saturated w/ new information regarding her diabetes today.

## 2013-05-22 NOTE — Progress Notes (Signed)
Pt had 5 runs of KCL ordered overnight. Per night RN, pt could not tolerate KCL runs any higher than 4935ml/hr d/t burning. Pt's K+=3.8 on 0530 BMET while 2nd run still finishing and received a 3rd run after blood drawn. Dr Rama aware and has given orders to give pt another 20meq orally and not to give remaining 2 KCL runs.

## 2013-05-22 NOTE — Progress Notes (Signed)
Came to visit patient at bedside to discuss and offer [facility] Care Management services. Patient newly diagnosed with DM. States she could use the additional support and education once she gets home. Explained that she will receive post hospital discharge call and will be evaluated for monthly visits. Consents were signed. Confirmed contact information. Left [facility] Care [facility] packet at bedside. Appreciative of visit. Made inpatient RNCM aware that [facility] will follow.  Raiford NobleAtika Kenidi Elenbaas, MSN, RN, BSN- [facility] Care Management Hospital [phone]- [phone]

## 2013-05-22 NOTE — Progress Notes (Signed)
Inpatient Diabetes Program Recommendations  AACE/ADA: New Consensus Statement on Inpatient Glycemic Control (2013)  Target Ranges:  Prepandial:   less than 140 mg/dL      Peak postprandial:   less than 180 mg/dL (1-2 hours)      Critically ill patients:  140 - 180 mg/dL    Inpatient Diabetes Program Recommendations HgbA1C: Please order an add on A1C to blood in the lab to evaluate glycemic control over the past 2-3 months.  Thanks, Orlando PennerMarie Granger Chui, RN, MSN, CCRN Diabetes Coordinator Inpatient Diabetes Program [phone] (Team Pager) [phone] ([place] office) [phone] [facility] office)

## 2013-05-22 NOTE — Progress Notes (Signed)
  RD consulted for nutrition education regarding diabetes.   No results found for this basename: HGBA1C    RD provided "Carbohydrate Counting for People with Diabetes" handout from the Academy of Nutrition and Dietetics. Discussed different food groups and their effects on blood sugar, emphasizing carbohydrate-containing foods. Provided list of carbohydrates and recommended serving sizes of common foods.  Discussed importance of controlled and consistent carbohydrate intake throughout the day. Provided examples of ways to balance meals/snacks and encouraged intake of high-fiber, whole grain complex carbohydrates. Teach back method used.  Expect poor to fair compliance. Pt was watching television for large amount of education. Diet recall indicated large amounts of low fiber foods (white rice and breads) and sweetened beverages. Pt did ask questions throughout education, so some information may have been retained. Recommend pt to avoid soda/sweetened drinks, encouraged CHO portion control, eat breakfast and incorporate more whole grain options  Body mass index is 31.36 kg/(m^2). Pt meets criteria for Obesity I based on current BMI.  Current diet order is Carb Modified, patient is consuming approximately 75% of meals at this time. Labs and medications reviewed. No further nutrition interventions warranted at this time. RD contact information provided. If additional nutrition issues arise, please re-consult RD.  Lloyd HugerSarah F Abbigale Mcelhaney MS RD LDN Clinical Dietitian [phone]

## 2013-05-23 DIAGNOSIS — E1310 Other specified diabetes mellitus with ketoacidosis without coma: Secondary | ICD-10-CM | POA: Diagnosis present

## 2013-05-23 LAB — BASIC METABOLIC PANEL
BUN: 18 mg/dL (ref 6–23)
CALCIUM: 9 mg/dL (ref 8.4–10.5)
CO2: 23 meq/L (ref 19–32)
CREATININE: 0.84 mg/dL (ref 0.50–1.10)
Chloride: 93 mEq/L — ABNORMAL LOW (ref 96–112)
GFR, EST AFRICAN AMERICAN: 85 mL/min — AB (ref >90)
GFR, EST NON AFRICAN AMERICAN: 74 mL/min — AB (ref >90)
Glucose, Bld: 361 mg/dL — ABNORMAL HIGH (ref 70–99)
Potassium: 3.9 mEq/L (ref 3.7–5.3)
SODIUM: 131 meq/L — AB (ref 137–147)

## 2013-05-23 LAB — GLUCOSE, CAPILLARY
GLUCOSE-CAPILLARY: 345 mg/dL — AB (ref 70–99)
Glucose-Capillary: 307 mg/dL — ABNORMAL HIGH (ref 70–99)
Glucose-Capillary: 378 mg/dL — ABNORMAL HIGH (ref 70–99)

## 2013-05-23 MED ORDER — INSULIN GLARGINE 100 UNIT/ML ~~LOC~~ SOLN
40.0000 [IU] | Freq: Every day | SUBCUTANEOUS | Status: DC
  Administered 2013-05-23: 40 [IU] via SUBCUTANEOUS
  Filled 2013-05-23 (×3): qty 0.4

## 2013-05-23 MED ORDER — INSULIN ASPART 100 UNIT/ML ~~LOC~~ SOLN
7.0000 [IU] | Freq: Three times a day (TID) | SUBCUTANEOUS | Status: DC
  Administered 2013-05-23 – 2013-05-24 (×4): 7 [IU] via SUBCUTANEOUS

## 2013-05-23 NOTE — Progress Notes (Signed)
[facility] PROGRESS NOTE   Connie Branch KGS:[id number] DOB: [birth year] DOA: 05/21/2013 PCP: Tawanna Solo, MD  Brief narrative: Connie Branch is an 63 y.o. female with a PMH of HTN, HLD, but no known history of DM who presented to the ER with a chief complaint of polydipsia and hand cramps. Upon initial evaluation in the ED, the patient was found to be in DKA.   Assessment/Plan: Principal Problem:  DKA (diabetic ketoacidoses)/obesity Patient was admitted and aggressively hydrated overnight with Q4 hour electrolyte checks. She was placed on IV insulin. Once her blood glucoses were less than 250, dextrose was added to her IV solution. She was transitioned to basal/bolus insulin which she will met parameters for discontinuation of the insulin drip. Her potassium was aggressively replaced. Sugar is now 307-378, on Lantus 30 units and resistant scale SSI with 6 units of meal coverage. Increase Lantus to 40 units and increase meal coverage to 7 units. Diabetes coordinator and dietitian providing education. Hemoglobin A1c 14.5%.  Active Problems:  Muscle spasm / hypokalemia  Likely from electrolyte shifts and hypokalemia. Improved with potassium supplementation.  Hyponatremia  Likely reflective of hyperglycemia. Should correct with correction of glucose.  ARF (acute renal failure) secondary to dehydration from osmotic diuresis  Resolved with IV fluids. Hold ARB / HCTZ.  HTN (hypertension)  Hold home medications given ARF. Blood pressure not currently elevated. Hyperlipidemia  Continue Zocor. Cholesterol 132, triglycerides 137, HDL 52, and LDL 53, good control.  GERD (gastroesophageal reflux disease)  Continue PPI.  Infection due to Trichomonas  GC and chlamydia probes pending. HIV negative. Given Flagyl 2 grams PO x 1.   Code Status: Full.  Family Communication: No family at bedside.  Disposition Plan: Home when stable.   IV access:  Peripheral IV  Medical  Consultants:  None.  Other Consultants:  Diabetes coordinator  Dietitian  Anti-infectives:  Flagyl x11/16/15  HPI/Subjective: Connie Branch denies nausea, vomiting or abdominal pain. Polyuria and polydipsia have improved. Nervous about giving herself insulin injections after she bled from the site.  Hand cramps gone.  Objective: Filed Vitals:   05/22/13 1457 05/22/13 2145 05/23/13 0535 05/23/13 1320  BP: 101/82 117/73 121/68 133/76  Pulse: 96 94 82 93  Temp: 97.4 F (36.3 C) 97.5 F (36.4 C) 97.7 F (36.5 C) 98 F (36.7 C)  TempSrc: Oral Oral Oral Oral  Resp: _0 Height:      Weight:      SpO2: 100% 100% 100% 99%    Intake/Output Summary (Last 24 hours) at 05/23/13 1327 Last data filed at 05/23/13 0900  Gross per 24 hour  Intake    480 ml  Output      0 ml  Net    480 ml    Exam: Gen:  NAD Cardiovascular:  RRR, No M/R/G Respiratory:  Lungs CTAB Gastrointestinal:  Abdomen soft, NT/ND, + BS Extremities:  No C/E/C  Data Reviewed: Basic Metabolic Panel:  Recent Labs Lab 05/21/13 1912 05/21/13 2257 05/22/13 0219 05/22/13 0543 05/23/13 0514  NA 132* 133* 134* 133* 131*  K 3.2* 3.1* 3.3* 3.8 3.9  CL 90* 93* 93* 94* 93*  CO2 _1 GLUCOSE 264* 142* 126* 200* 361*  BUN _2 CREATININE 0.77 0.74 0.77 0.75 0.84  CALCIUM 9.0 9.1 8.7 8.4 9.0   GFR Estimated Creatinine Clearance: 73.3 ml/min (by C-G formula based on Cr of 0.84). Liver Function Tests:  Recent Labs Lab 05/21/13  1251  AST 43*  ALT 154*  ALKPHOS 151*  BILITOT 0.6  PROT 7.8  ALBUMIN 4.7    CBC:  Recent Labs Lab 05/21/13 1251 05/21/13 1307 05/22/13 0219  WBC 7.1  --  7.5  HGB 13.8 17.0* 13.3  HCT 40.2 50.0* 36.7  MCV 85.5  --  84.2  PLT 148*  --  149*   CBG:  Recent Labs Lab 05/22/13 1146 05/22/13 1737 05/22/13 2146 05/23/13 0740 05/23/13 1150  GLUCAP 353* 314* 315* 307* 378*   Lipid Profile  Recent Labs  05/22/13 0219  CHOL  132  HDL 52  LDLCALC 53  TRIG 137  CHOLHDL 2.5     Procedures and Diagnostic Studies: No results found.  Scheduled Meds: . enoxaparin (LOVENOX) injection  40 mg Subcutaneous Q24H  . insulin aspart  0-20 Units Subcutaneous TID WC  . insulin aspart  0-5 Units Subcutaneous QHS  . insulin aspart  7 Units Subcutaneous TID WC  . insulin glargine  40 Units Subcutaneous QHS  . pantoprazole  40 mg Oral Daily  . simvastatin  20 mg Oral Daily   Continuous Infusions:   Time spent: 35 minutes with > 50% of time discussing current diagnostic test results, clinical impression and plan of care.    LOS: 2 days   RAMA,CHRISTINA  [facility] Pager [phone].   *Please note that the hospitalists switch teams on Wednesdays. Please call the flow manager at [phone] if you are having difficulty reaching the hospitalist taking care of this patient as she can update you and provide the most up-to-date pager number of provider caring for the patient. If 8PM-8AM, please contact night-coverage at [email or web address], password [facility]  05/23/2013, 1:27 PM

## 2013-05-23 NOTE — Care Management Utilization Note (Signed)
UR complete    Avika Carbine,MSN,RN [phone] 

## 2013-05-23 NOTE — Progress Notes (Addendum)
Patient expressed a concern that her ankles had bilateral +1 edema and wondered if her home medication Losartan-Hydrochlorothiazide 25 mg tablet should be restarted. The PCP was notified and the PCP stated that the patient was in ARF so the medication was on hold for now.  RN to notify the patient that the medication is on hold for now.  Patient stated that she will talk with her MD tomorrow about her medications.

## 2013-05-23 NOTE — Progress Notes (Signed)
05/23/2013 1630 NCM spoke to pt and had questions about her medication copay and HH. Pt states her nephew lives at home but will not be able to provide assistance with her care. She is concerned about administering injections and diet. NCM explained outpt diabetes management center offers classes and nutritionist to assist with diabetes management. HH RN to assist with diabetes teaching. Pt states she has a $45.00 copay for 70/30 insulin. Unable to verify benefits on weekend. Pt dc on weekend her pharamcy can provided copay cost for Rx. HH list provided to pt. Pt states she has Medicare A&B, cousin, Connie Branch # [phone] to bring her insurance card Connie DonningAlesia Teressa Mcglocklin RN CCM Case Mgmt phone [phone]

## 2013-05-24 LAB — BASIC METABOLIC PANEL
BUN: 18 mg/dL (ref 6–23)
CHLORIDE: 96 meq/L (ref 96–112)
CO2: 25 mEq/L (ref 19–32)
CREATININE: 0.77 mg/dL (ref 0.50–1.10)
Calcium: 8.8 mg/dL (ref 8.4–10.5)
GFR calc Af Amer: >90 mL/min (ref >90)
GFR calc non Af Amer: 89 mL/min — ABNORMAL LOW (ref >90)
GLUCOSE: 231 mg/dL — AB (ref 70–99)
Potassium: 3.5 mEq/L — ABNORMAL LOW (ref 3.7–5.3)
Sodium: 136 mEq/L — ABNORMAL LOW (ref 137–147)

## 2013-05-24 LAB — GLUCOSE, CAPILLARY
Glucose-Capillary: 150 mg/dL — ABNORMAL HIGH (ref 70–99)
Glucose-Capillary: 213 mg/dL — ABNORMAL HIGH (ref 70–99)
Glucose-Capillary: 330 mg/dL — ABNORMAL HIGH (ref 70–99)
Glucose-Capillary: 209 mg/dL — ABNORMAL HIGH (ref 70–99)

## 2013-05-24 MED ORDER — INSULIN ISOPHANE & REGULAR (HUMAN 70-30)100 UNIT/ML KWIKPEN: 30.0000 [IU] | PEN_INJECTOR | Freq: Two times a day (BID) | SUBCUTANEOUS | Status: DC

## 2013-05-24 MED ORDER — LOSARTAN POTASSIUM-HCTZ 100-25 MG PO TABS: 1.0000 | ORAL_TABLET | Freq: Every day | ORAL | Status: DC

## 2013-05-24 MED ORDER — BLOOD GLUCOSE METER KIT: PACK | Status: AC

## 2013-05-24 MED ORDER — LOSARTAN POTASSIUM 50 MG PO TABS
100.0000 mg | ORAL_TABLET | Freq: Every day | ORAL | Status: DC
  Administered 2013-05-24: 100 mg via ORAL
  Filled 2013-05-24: qty 2

## 2013-05-24 MED ORDER — HYDROCHLOROTHIAZIDE 25 MG PO TABS
25.0000 mg | ORAL_TABLET | Freq: Every day | ORAL | Status: DC
  Administered 2013-05-24: 25 mg via ORAL
  Filled 2013-05-24: qty 1

## 2013-05-24 NOTE — Progress Notes (Signed)
Pt discharged home with paper prescriptions for glucose meter kit including strips. Pt verbalizes that pharmacy has confirmed that her discharge medications are ready for pick up. Pt wheeled to vehicle by RN. Friend present and pt stable at time of discharge

## 2013-05-24 NOTE — Progress Notes (Signed)
   CARE MANAGEMENT NOTE 05/24/2013  Patient:  Connie Branch,Connie Branch   Account Number:  [id number]  Date Initiated:  05/22/2013  Documentation initiated by:  McGIBBONEY,COOKIE  Subjective/Objective Assessment:   PT ADMITTED WITH DKA BLD GLUCOSE >600, 823     Action/Plan:   FROM HOME   Anticipated DC Date:  05/23/2013   Anticipated DC Plan:  HOME/SELF CARE      DC Planning Services  CM consult  Medication Assistance      [facility]   Choice offered to / List presented to:  C-1 Patient        HH arranged  HH-1 RN      [facility]  [facility]   Status of service:  Completed, signed off Medicare Important Message given?  NA - LOS <3 / Initial given by admissions (If response is "NO", the following Medicare IM given date fields will be blank) Date Medicare IM given:   Date Additional Medicare IM given:    Discharge Disposition:  HOME W HOME HEALTH SERVICES  Per UR Regulation:  Reviewed for med. necessity/level of care/duration of stay  If discussed at Long Length of Stay Meetings, dates discussed:    Comments:  05/24/2013 1445 Pt requested [facility] for [facility]. Notified [facility] of HH for scheduled dc today. Provided pt with contact info for [facility] to follow up for classes. Isidoro DonningAlesia Tallie Dodds RN CCM Case Mgmt phone [phone]  05/23/2013 1630 NCM spoke to pt and had questions about her medication copay and HH. Pt states her nephew lives at home but will not be able to provide assistance with her care. She is concerned about administering injections and diet. NCM explained outpt diabetes management center offers classes and nutritionist to assist with diabetes management. HH RN to assist with diabetes teaching. Pt states she has a $45.00 copay for 70/30 insulin. Unable to verify benefits on weekend. Pt dc on weekend her pharamcy can provided copay cost for Rx. HH list provided to pt. Pt states she has Medicare A&B, cousin, Braxton FeathersMarie Pierce # [phone] to bring  her insurance card Isidoro DonningAlesia Amery Minasyan RN CCM Case Mgmt phone [phone]  05/22/13 MMCGIBBONEY, RN, BSN CHART REVIEWED.

## 2013-05-24 NOTE — Progress Notes (Signed)
Called [facility] pharmacy regarding patient's  Insulin and they stated they need to talk Dr. Darnelle Catalanama first. Paged Dr. Darnelle Catalanama and gave her the [facility] number to call and talk to the pharmacist. Patient informed and reported off to night shift nurse to F/U with discharge.

## 2013-05-24 NOTE — Progress Notes (Signed)
Discharge instructions given and explained to patient at 2pm, after case manager saw her, patient called and stated none of the drug stores have the 70/30 insulin pain stocked that it has to be ordered, notified Dr. Darnelle Catalanama and she ordered the vial for 70/30 until the patient gets the insulin pen, on notifying the patient that the vial has been called in, she stated she just talk to her insurance, that her insurance does not cover it; informed Dr. Darnelle Catalanama again and she stated she will call the pharmacy. [facility] called patient and got  some information from her and stated they will call her back, patient waiting to get a call back from [facility] before she leaves.

## 2013-05-24 NOTE — Discharge Summary (Signed)
Physician Discharge Summary  Connie Branch PCH:[id number] DOB: 07-08-1950 DOA: 05/21/2013  PCP: Tawanna Solo, MD  Admit date: 05/21/2013 Discharge date: 05/24/2013  Recommendations for Outpatient Follow-up:  1. Close outpatient F/U to ensure improvement in glycemic control, titration of insulin, etc. 2. F/U GC/chlamydia probe (pending at time of discharge).  Treated with Flagyl 2 g PO x 1 for Trichomonas.  Discharge Diagnoses:  Principal Problem:    DKA (diabetic ketoacidoses) in a patient with newly diagnosed type II DM Active Problems:    Muscle spasm    Hyponatremia    ARF (acute renal failure) secondary to dehydration from osmotic diuresis    HTN (hypertension)    Hyperlipidemia    GERD (gastroesophageal reflux disease)    Infection due to trichomonas    Hypokalemia    Obesity (BMI 30-39.9)    DM (diabetes mellitus), secondary, uncontrolled, with ketoacidosis   Discharge Condition: Improved.  Diet recommendation: Carbohydrate modified.  History of present illness:  Connie Branch is an 63 y.o. female with a PMH of HTN, HLD, but no known history of DM who presented to the ER with a chief complaint of polydipsia and hand cramps. Upon initial evaluation in the ED, the patient was found to be in DKA.  Hospital Course by problem:  Principal Problem:  DKA (diabetic ketoacidoses) in a patient with newly diagnosed type II DM /obesity  Patient was admitted and aggressively hydrated overnight with Q4 hour electrolyte checks. She was placed on IV insulin. Once her blood glucoses were less than 250, dextrose was added to her IV solution. She was transitioned to basal/bolus insulin which she will met parameters for discontinuation of the insulin drip. Her potassium was aggressively replaced. Her sugars improved on Lantus 40 units, resistant scale SSI with 7 units of meal coverage. Will D/C home on 30 units of 70/30 BID . Diabetes coordinator and dietitian provided  education. Hemoglobin A1c 14.5%. Needs close outpatient F/U. Active Problems:  Muscle spasm / hypokalemia  Likely from electrolyte shifts and hypokalemia. Resolved with potassium supplementation.  Hyponatremia  Likely reflective of hyperglycemia. Corrected with correction of glucose.  ARF (acute renal failure) secondary to dehydration from osmotic diuresis  Resolved with IV fluids. Resume ARB / HCTZ.  HTN (hypertension)  BP medication resumed.  Hyperlipidemia  Continue Zocor. Cholesterol 132, triglycerides 137, HDL 52, and LDL 53, good control.  GERD (gastroesophageal reflux disease)  Continue PPI.  Infection due to Trichomonas  GC and chlamydia probes pending. HIV negative. Given Flagyl 2 grams PO x 1.   Procedures:  None  Consultations:  DM coordinator  Dietician  Discharge Exam: Filed Vitals:   05/24/13 0959  BP: 127/66  Pulse: 98  Temp:   Resp:    Filed Vitals:   05/23/13 1320 05/23/13 2203 05/24/13 0528 05/24/13 0959  BP: 133/76 120/78 122/77 127/66  Pulse: 93 97 91 98  Temp: 98 F (36.7 C) 98.3 F (36.8 C) 97.8 F (36.6 C)   TempSrc: Oral Oral Oral   Resp: _0 Height:      Weight:      SpO2: 99% 95% 100%     Gen:  NAD Cardiovascular:  RRR, No M/R/G Respiratory: Lungs CTAB Gastrointestinal: Abdomen soft, NT/ND with normal active bowel sounds. Extremities: No C/E/C   Discharge Instructions  Discharge Orders   Future Orders Complete By Expires   Ambulatory referral to Nutrition and Diabetic Education  As directed    Comments:     New onset DM  Call MD for:  extreme fatigue  As directed    Call MD for:  persistant nausea and vomiting  As directed    Call MD for:  As directed    Scheduling Instructions:     More than 3 blood sugar readings greater than 350, excessive thirst or urination.   Diet Carb Modified  As directed    Discharge instructions  As directed    Comments:     You are going to be treated with a combination insulin that  contains both long and short acting insulins.  You will give yourself 30 units of this 70/30 insulin before breakfast and supper.  Check your blood glucoses before you give yourself an insulin injection and make a note of the date/time and blood sugar reading and bring this information with you to your doctor's appointment.   Home Health  As directed    Scheduling Instructions:     Newly diagnosed DM, please make sure that the patient understands how to check her CBGs and correctly administers insulin.   Questions:     To provide the following care/treatments:  RN   Increase activity slowly  As directed        Medication List    STOP taking these medications       clindamycin 300 MG capsule  Commonly known as:  CLEOCIN      TAKE these medications       Blood Glucose Meter kit  Use as instructed     esomeprazole 20 MG capsule  Commonly known as:  NEXIUM  Take 20 mg by mouth daily at 12 noon.     Insulin Isophane & Regular Human (70-30) 100 UNIT/ML PEN  Commonly known as:  HUMULIN 70/30 KWIKPEN  Inject 30 Units into the skin 2 (two) times daily.     losartan-hydrochlorothiazide 100-25 MG per tablet  Commonly known as:  HYZAAR  Take 1 tablet by mouth daily.     MAALOX MAX PO  Take 5 mLs by mouth daily as needed (acid).     simvastatin 20 MG tablet  Commonly known as:  ZOCOR  Take 20 mg by mouth daily.           Follow-up Information   Follow up with Tawanna Solo, MD. Schedule an appointment as soon as possible for a visit in 4 days. [facility] follow up.)    Specialty:  Family Medicine   Contact information:   [place] [address] [phone]        The results of significant diagnostics from this hospitalization (including imaging, microbiology, ancillary and laboratory) are listed below for reference.    Significant Diagnostic Studies: No results found.  Labs:  Basic Metabolic Panel:  Recent Labs Lab 05/21/13 2257  05/22/13 0219 05/22/13 0543 05/23/13 0514 05/24/13 0558  NA 133* 134* 133* 131* 136*  K 3.1* 3.3* 3.8 3.9 3.5*  CL 93* 93* 94* 93* 96  CO2 _0 GLUCOSE 142* 126* 200* 361* 231*  BUN _1 CREATININE 0.74 0.77 0.75 0.84 0.77  CALCIUM 9.1 8.7 8.4 9.0 8.8   GFR Estimated Creatinine Clearance: 76.9 ml/min (by C-G formula based on Cr of 0.77). Liver Function Tests:  Recent Labs Lab 05/21/13 1251  AST 43*  ALT 154*  ALKPHOS 151*  BILITOT 0.6  PROT 7.8  ALBUMIN 4.7   CBC:  Recent Labs Lab 05/21/13 1251 05/21/13 1307 05/22/13 0219  WBC 7.1  --  7.5  HGB 13.8 17.0* 13.3  HCT 40.2 50.0* 36.7  MCV 85.5  --  84.2  PLT 148*  --  149*   CBG:  Recent Labs Lab 05/23/13 0740 05/23/13 1150 05/23/13 1548 05/23/13 2207 05/24/13 0741  GLUCAP 307* 378* 345* 150* 213*   Hgb A1c  Recent Labs  05/22/13 0219  HGBA1C 14.5*   Lipid Profile  Recent Labs  05/22/13 0219  CHOL 132  HDL 52  LDLCALC 53  TRIG 137  CHOLHDL 2.5    Time coordinating discharge: 40 minutes.  Signed:  RAMA,CHRISTINA  Pager [phone] [facility] 05/24/2013, 11:19 AM

## 2013-05-24 NOTE — Discharge Instructions (Signed)
Blood Glucose Monitoring, Adult Monitoring your blood glucose (also know as blood sugar) helps you to manage your diabetes. It also helps you and your health care provider monitor your diabetes and determine how well your treatment plan is working. WHY SHOULD YOU MONITOR YOUR BLOOD GLUCOSE?  It can help you understand how food, exercise, and medicine affect your blood glucose.  It allows you to know what your blood glucose is at any given moment. You can quickly tell if you are having low blood glucose (hypoglycemia) or high blood glucose (hyperglycemia).  It can help you and your health care provider know how to adjust your medicines.  It can help you understand how to manage an illness or adjust medicine for exercise. WHEN SHOULD YOU TEST? Your health care provider will help you decide how often you should check your blood glucose. This may depend on the type of diabetes you have, your diabetes control, or the types of medicines you are taking. Be sure to write down all of your blood glucose readings so that this information can be reviewed with your health care provider. See below for examples of testing times that your health care provider may suggest. Type 1 Diabetes  Test 4 times a day if you are in good control, using an insulin pump, or perform multiple daily injections.  If your diabetes is not well-controlled or if you are sick, you may need to monitor more often.  It is a good idea to also monitor:  Before and after exercise.  Between meals and 2 hours after a meal.  Occasionally between 2:00 to 3:00 am. Type 2 Diabetes  It can vary with each person, but generally, if you are on insulin, test 4 times a day.  If you take medicines by mouth (orally), test 2 times a day.  If you are on a controlled diet, test once a day.  If your diabetes is not well controlled or if you are sick, you may need to monitor more often. HOW TO MONITOR YOUR BLOOD GLUCOSE Supplies  Needed  Blood glucose meter.  Test strips for your meter. Each meter has its own strips. You must use the strips that go with your own meter.  A pricking needle (lancet).  A device that holds the lancet (lancing device).  A journal or log book to write down your results. Procedure  Wash your hands with soap and water. Alcohol is not preferred.  Prick the side of your finger (not the tip) with the lancet.  Gently milk the finger until a small drop of blood appears.  Follow the instructions that come with your meter for inserting the test strip, applying blood to the strip, and using your blood glucose meter. Other Areas to Get Blood for Testing Some meters allow you to use other areas of your body (other than your finger) to test your blood. These areas are called alternative sites. The most common alternative sites are:  The forearm.  The thigh.  The back area of the lower leg.  The palm of the hand. The blood flow in these areas is slower. Therefore, the blood glucose values you get may be delayed, and the numbers are different from what you would get from your fingers. Do not use alternative sites if you think you are having hypoglycemia. Your reading will not be accurate. Always use a finger if you are having hypoglycemia. Also, if you cannot feel your lows (hypoglycemia unawareness), always use your fingers for your blood  glucose checks. ADDITIONAL TIPS FOR GLUCOSE MONITORING  Do not reuse lancets.  Always carry your supplies with you.  All blood glucose meters have a 24-hour "hotline" number to call if you have questions or need help.  Adjust (calibrate) your blood glucose meter with a control solution after finishing a few boxes of strips. BLOOD GLUCOSE RECORD KEEPING It is a good idea to keep a daily record or log of your blood glucose readings. Most glucose meters, if not all, keep your glucose records stored in the meter. Some meters come with the ability to download  your records to your home computer. Keeping a record of your blood glucose readings is especially helpful if you are wanting to look for patterns. Make notes to go along with the blood glucose readings because you might forget what happened at that exact time. Keeping good records helps you and your health care provider to work together to achieve good diabetes management.  Document Released: 04/26/2003 Document Revised: 12/24/2012 Document Reviewed: 09/15/2012 [facility] Patient Information 2014 [place], [place].  Diabetic Ketoacidosis Diabetic ketoacidosis (DKA) is a life-threatening complication of type 1 diabetes. It must be quickly recognized and treated. Treatment requires hospitalization. CAUSES  When there is no insulin in the body, glucose (sugar) cannot be used and the body breaks down fat for energy. When fat breaks down, acids (ketones) build up in the blood. Very high levels of glucose and high levels of acids lead to severe loss of body fluids (dehydration) and other dangerous chemical changes. This stresses your vital organs and can cause coma or death. SYMPTOMS   Tiredness (fatigue).  Weight loss.  Excessive thirst.  Ketones in the urine.  Lightheadedness.  Fruity or sweet smell on your breath.  Excessive urination.  Visual changes.  Confusion or irritability.  Feeling sick to your stomach (nauseous) or vomiting.  Rapid breathing.  Stomachache or belly (abdominal) pain. DIAGNOSIS  Your caregiver will diagnose DKA based on your history, physical exam, and blood tests. Your caregiver will check if there is another illness present which caused you to go into DKA. Most of this will be done quickly in an emergency room. TREATMENT   Fluid replacement to correct dehydration.  Insulin.  Correction of electrolytes, such as potassium and sodium.  Medicines (antibiotics) that kill germs for infections. PREVENTION  Always take your insulin. Do not skip your insulin  injections.  If you are ill, treat yourself quickly. Your body often needs more insulin to fight the illness.  Check your blood glucose regularly.  Check urine ketones if your blood glucose is greater than 240 milligrams per deciliter (mg/dl).  Do not used expired or outdated insulin.  If your blood glucose is high, drink plenty of fluids. This helps flush out ketones. HOME CARE INSTRUCTIONS   If you are ill, follow the advice of your caregiver.  To prevent loss of body fluids (dehydration), drink enough water and fluids to keep your urine clear or pale yellow.  If you cannot eat, alternate between drinking fluids with sugar (soda, juices, flavored gelatin) and salty fluids (broth, bouillon).  If you can eat, follow your usual diet and drink sugar-free liquids (water, diet drinks).  Always take your usual dose of insulin. If you cannot eat, or your glucose is getting too low, call your caregiver for further instructions.  Continue to monitor your blood or urine ketones every 3 to 4 hours around the clock. Set your alarm clock or have someone wake you up. If you are  too sick, have someone test it for you.  Rest and avoid exercise. SEEK MEDICAL CARE IF:   You have ketones in your urine or your blood glucose is higher than a level your caregiver suggests. You may need extra insulin. Call your caregiver if you need advice on adjusting your insulin.  You cannot drink at least a tablespoon of fluid every 15 to 20 minutes.  You have been throwing up for more than 2 hours.  You have symptoms of DKA:  Fruity smelling breath.  Breathing faster or slower.  Becoming very sleepy. SEEK IMMEDIATE MEDICAL CARE IF:   You have signs of dehydration:  Decreased urination.  Increased thirst.  Dry skin and mouth.  Lightheadedness.  Your blood glucose is very high (as advised by your caregiver) twice in a row.  You or your child has an oral temperature above 102 F (38.9 C), not  controlled by medicine.  You pass out.  You have chest pain and/or trouble breathing.  You have a sudden, severe headache.  You have sudden weakness in one arm and/or one leg.  You have sudden difficulty speaking and/or swallowing.  You develop vomiting and/or diarrhea that is getting worse after 3 to 4 hours.  You have abdominal pain. MAKE SURE YOU:   Understand these instructions.  Will watch your condition.  Will get help right away if you are not doing well or get worse. Document Released: 04/20/2000 Document Revised: 07/16/2011 Document Reviewed: 10/27/2008 [facility] Patient Information 2014 [place], [place].

## 2013-05-24 NOTE — Plan of Care (Signed)
Problem: Discharge Progression Outcomes Goal: CBGs controlled on DM discharge meds Outcome: Adequate for Discharge Started on home Insuline. Patient educated and demonstrated well.

## 2013-05-26 LAB — GC/CHLAMYDIA PROBE AMP
CT Probe RNA: NEGATIVE
GC Probe RNA: NEGATIVE

## 2013-05-28 ENCOUNTER — Encounter: Payer: Medicare Other | Attending: Family Medicine | Admitting: *Deleted

## 2013-05-28 ENCOUNTER — Encounter: Payer: Self-pay | Admitting: *Deleted

## 2013-05-28 VITALS — Ht 64.0 in | Wt 184.8 lb

## 2013-05-28 DIAGNOSIS — E1010 Type 1 diabetes mellitus with ketoacidosis without coma: Secondary | ICD-10-CM | POA: Insufficient documentation

## 2013-05-28 DIAGNOSIS — E1110 Type 2 diabetes mellitus with ketoacidosis without coma: Secondary | ICD-10-CM

## 2013-05-28 DIAGNOSIS — Z713 Dietary counseling and surveillance: Secondary | ICD-10-CM | POA: Insufficient documentation

## 2013-05-28 DIAGNOSIS — E1310 Other specified diabetes mellitus with ketoacidosis without coma: Secondary | ICD-10-CM

## 2013-05-28 NOTE — Patient Instructions (Signed)
Plan:  Aim for 2 Carb Choices per meal (30 grams) +/- 1 either way  Aim for 0-1 Carbs per snack if hungry  Consider reading food labels for Total Carbohydrate of foods Continue with your activity level daily as tolerated Consider checking your BG before bed and if it is around 100 or below, be sure to have a snack with 1 carb and a protein Continue checking BG at alternate times per day as directed by MD

## 2013-05-28 NOTE — Progress Notes (Signed)
Appt start time: 1630 end time:  1800.  Assessment:  Patient was seen on  05/28/13 for individual diabetes education. Patient newly diagnosed with diabetes last week and is here after her hospitalization. She is new on Lantus and Novolog, she is taking it consistently so far. She does not work so she is at home during the day, she shops and cooks her own meals. SMBG before each meal and reports range of 109 - 250 mg/dl  [facility] for an hour and participates in an arthritis water class 5 days a week.  Current HbA1c: 14.5 % (newly diagnosed)  Preferred Learning Style:   No preference indicated   Learning Readiness:   Contemplating  Change in progress  MEDICATIONS: see list, diabetes medication includes Lantus and Novolog  DIETARY INTAKE:  24-hr recall:  B ( AM): scrambled egg, [place]key sausage, OR English muffin with [place]urkey sausage water to drink,  Snk ( AM): no  L ( PM): salmon salad on an [place] Muffin, diet soda or flavored water Snk ( PM): no D ( PM): lean meat, vegetables, small serving of starch occasionally, diet soda Snk ( PM): yogurt if anything Beverages: water, diet soda  Usual physical activity: water exercises for over an hour 5 days a week  Estimated energy needs: 1200 calories 135 g carbohydrates 90 g protein 33 g fat    Intervention:  Nutrition counseling provided.  Discussed diabetes disease process and treatment options.  Discussed physiology of diabetes and role of obesity on insulin resistance.  Encouraged moderate weight reduction to improve glucose levels.  Discussed role of medications and diet in glucose control  Provided education on macronutrients on glucose levels.  Provided education on carb counting, importance of regularly scheduled meals/snacks, and meal planning  Discussed effects of physical activity on glucose levels and long-term glucose control.  Recommended 150 minutes of physical activity/week.  Reviewed  patient medications.  Discussed role of medication on blood glucose and possible side effects  Discussed blood glucose monitoring and interpretation.  Discussed recommended target ranges and individual ranges.    Described short-term complications: hyper- and hypo-glycemia.  Discussed causes,symptoms, and treatment options.  Discussed prevention, detection, and treatment of long-term complications.  Discussed the role of prolonged elevated glucose levels on body systems.  Discussed role of stress on blood glucose levels and discussed strategies to manage psychosocial issues.  Discussed recommendations for long-term diabetes self-care.  Established checklist for medical, dental, and emotional self-care.  Plan:  Aim for 2 Carb Choices per meal (30 grams) +/- 1 either way  Aim for 0-1 Carbs per snack if hungry  Consider reading food labels for Total Carbohydrate of foods Continue with your activity level daily as tolerated Consider checking your BG before bed and if it is around 100 or below, be sure to have a snack with 1 carb and a protein Continue checking BG at alternate times per day as directed by MD   Teaching Method Utilized: Visual, Auditory and Hands on  Handouts given during visit include: Living Well with Diabetes Carb Counting and Food Label handouts Meal Plan Card  Barriers to learning/adherence to lifestyle change: new diagnosis of chronic disease  Diabetes self-care support plan:   [facility]  Demonstrated degree of understanding via:  Teach Back   Monitoring/Evaluation:  Dietary intake, exercise, reading food labels, SMBG, and body weight in 1 month(s).

## 2013-06-22 ENCOUNTER — Encounter: Payer: Medicare Other | Attending: Family Medicine | Admitting: *Deleted

## 2013-06-22 ENCOUNTER — Encounter: Payer: Self-pay | Admitting: *Deleted

## 2013-06-22 VITALS — Ht 64.0 in | Wt 185.9 lb

## 2013-06-22 DIAGNOSIS — Z713 Dietary counseling and surveillance: Secondary | ICD-10-CM | POA: Insufficient documentation

## 2013-06-22 DIAGNOSIS — E1310 Other specified diabetes mellitus with ketoacidosis without coma: Secondary | ICD-10-CM

## 2013-06-22 DIAGNOSIS — E1010 Type 1 diabetes mellitus with ketoacidosis without coma: Secondary | ICD-10-CM | POA: Insufficient documentation

## 2013-06-22 NOTE — Patient Instructions (Signed)
Plan:  Continue with 2 Carb Choices per meal (30 grams) +/- 1 either way  Continue with 0-1 Carbs per snack if hungry  Continue with reading food labels for Total Carbohydrate of foods Continue with your activity level daily as tolerated Consider checking your BG before bed and if it is around 100 or below, be sure to have a snack with 1 carb and a protein Continue checking BG at alternate times per day as directed by MD  When you have symptoms for low BG, check you sugar, treat and record in Log Book, let MD know so she can adjust insulin dose

## 2013-06-22 NOTE — Progress Notes (Signed)
Appt start time: 1 end time:  1800.  Assessment:  Patient was seen on  05/28/13 for individual diabetes education follow up visit. She is here with her cousin who appears supportive and was with her at our last visit also. She brought her Log Book which she keeps in a calendar and all BGs are within target ranges except one that was 215 mg/dl. Due to the extreme difference from her normal numbers, I suggested it may have been an error.  [facility] Weight is stable but she states her clothes are fitting more loosely now. She states her Novolog insulin has been DC'd and she is currently only taking 36 units of Lantus per day  Current HbA1c: 14.5 % (newly diagnosed)  Preferred Learning Style:   No preference indicated   Learning Readiness:   Contemplating  Change in progress  MEDICATIONS: see list, diabetes medication includes Lantus and this dose has decreased from 40 to 36 units per day  DIETARY INTAKE:  24-hr recall:  B ( AM): scrambled egg, [place]key sausage, OR English muffin with [place]key sausage water to drink,  Snk ( AM): no  L ( PM): salmon salad on an [place] Muffin, diet soda or flavored water Snk ( PM): no D ( PM): lean meat, vegetables, small serving of starch occasionally, diet soda Snk ( PM): yogurt if anything Beverages: water, diet soda  Usual physical activity: water exercises for over an hour 5 days a week  Estimated energy needs: 1200 calories 135 g carbohydrates 90 g protein 33 g fat    Intervention:  Nutrition counseling provided.  Commended her on her many behavior changes and improved management of her diabetes  Encouraged her to continue with her Carb Counting and her exercise habits  Described short-term complications: hyper- and hypo-glycemia.  Discussed causes,symptoms, and treatment options and to call MD if her BG are in the 80's for 3 days so her insulin dose can be decreased again.  Plan:  Aim for 2 Carb Choices  per meal (30 grams) +/- 1 either way  Aim for 0-1 Carbs per snack if hungry  Consider reading food labels for Total Carbohydrate of foods Continue with your activity level daily as tolerated Consider checking your BG before bed and if it is around 100 or below, be sure to have a snack with 1 carb and a protein Continue checking BG at alternate times per day as directed by MD   Teaching Method Utilized: Visual, Auditory and Hands on  Handouts given during visit include: No new handouts at this visit  Barriers to learning/adherence to lifestyle change: new diagnosis of chronic disease  Diabetes self-care support plan:   [facility]  Demonstrated degree of understanding via:  Teach Back   Monitoring/Evaluation:  Dietary intake, exercise, reading food labels, SMBG, and body weight in 6 weeks.

## 2013-08-04 ENCOUNTER — Encounter: Payer: Medicare Other | Attending: Family Medicine | Admitting: *Deleted

## 2013-08-04 DIAGNOSIS — E1310 Other specified diabetes mellitus with ketoacidosis without coma: Secondary | ICD-10-CM

## 2013-08-04 DIAGNOSIS — E1010 Type 1 diabetes mellitus with ketoacidosis without coma: Secondary | ICD-10-CM | POA: Insufficient documentation

## 2013-08-04 DIAGNOSIS — Z713 Dietary counseling and surveillance: Secondary | ICD-10-CM | POA: Insufficient documentation

## 2013-08-04 NOTE — Progress Notes (Signed)
Appt start time: 0930 end time:  1000.  Assessment:  Patient was seen on  08/04/13 for individual diabetes education follow up visit. She is here with her cousin who appears supportive and has been with her at each visit. She brought her Log Book which she keeps in a calendar and all BGs are within target ranges. She brought her lab report with a Fructosamine test result of 238 which relates to an A1c of 5.5%! Fabulous! She states her Lantus dose is now down to 10 units per day and she is very happy with another 4 pound weight loss from 6 weeks ago.  Current HbA1c: 14.5 % (newly diagnosed) Had Fructosamine Test with result of 238 which relates to an A1c of 5.5%%  Preferred Learning Style:   No preference indicated   Learning Readiness:   Contemplating  Change in progress  MEDICATIONS: see list, diabetes medication includes Lantus and this dose has decreased from 40 to 36 to 10 units per day. She is now taking Metformin also  DIETARY INTAKE:  24-hr recall:  B ( AM): scrambled egg, [place]key sausage, OR English muffin with [place] sausage water to drink,  Snk ( AM): no  L ( PM): salmon salad on an [place] Muffin, diet soda or flavored water Snk ( PM): no D ( PM): lean meat, vegetables, small serving of starch occasionally, diet soda Snk ( PM): yogurt if anything Beverages: water, diet soda  Usual physical activity: water exercises for over an hour 5 days a week  Estimated energy needs: 1200 calories 135 g carbohydrates 90 g protein 33 g fat    Intervention:  Nutrition counseling provided.  Commended her on her many behavior changes and improved management of her diabetes  Encouraged her to continue with her Carb Counting and her exercise habits  Answered her questions regarding Sodium intake per meal recommendation and also how Metformin works to control BG  Plan:  Aim for 2 Carb Choices per meal (30 grams) +/- 1 either way  Aim for 0-1 Carbs per snack if hungry  Consider  reading food labels for Total Carbohydrate of foods Continue with your activity level daily as tolerated Continue checking your BG before bed and if it is around 100 or below, be sure to have a snack with 1 carb and a protein while on insulin Continue checking BG at alternate times per day as directed by MD   Teaching Method Utilized: Visual, Auditory and Hands on  Handouts given during visit include: Food Label with Sodium recommendation of 500 mg/ meal for total of 2000 mg/day  Barriers to learning/adherence to lifestyle change:none anymore Diabetes self-care support plan:   [facility] support group  Demonstrated degree of understanding via:  Teach Back   Monitoring/Evaluation:  Dietary intake, exercise, reading food labels, SMBG, and body weight in 6 weeks.

## 2013-09-22 ENCOUNTER — Ambulatory Visit: Payer: Medicare Other | Admitting: *Deleted

## 2015-09-13 DIAGNOSIS — M170 Bilateral primary osteoarthritis of knee: Secondary | ICD-10-CM | POA: Diagnosis not present

## 2015-09-16 DIAGNOSIS — H524 Presbyopia: Secondary | ICD-10-CM | POA: Diagnosis not present

## 2015-09-16 DIAGNOSIS — E119 Type 2 diabetes mellitus without complications: Secondary | ICD-10-CM | POA: Diagnosis not present

## 2015-09-16 DIAGNOSIS — H5203 Hypermetropia, bilateral: Secondary | ICD-10-CM | POA: Diagnosis not present

## 2015-09-16 DIAGNOSIS — H52221 Regular astigmatism, right eye: Secondary | ICD-10-CM | POA: Diagnosis not present

## 2015-09-16 DIAGNOSIS — H2513 Age-related nuclear cataract, bilateral: Secondary | ICD-10-CM | POA: Diagnosis not present

## 2015-09-16 DIAGNOSIS — H25013 Cortical age-related cataract, bilateral: Secondary | ICD-10-CM | POA: Diagnosis not present

## 2015-10-17 DIAGNOSIS — R69 Illness, unspecified: Secondary | ICD-10-CM | POA: Diagnosis not present

## 2015-11-07 DIAGNOSIS — I10 Essential (primary) hypertension: Secondary | ICD-10-CM | POA: Diagnosis not present

## 2015-11-07 DIAGNOSIS — Z7984 Long term (current) use of oral hypoglycemic drugs: Secondary | ICD-10-CM | POA: Diagnosis not present

## 2015-11-07 DIAGNOSIS — E119 Type 2 diabetes mellitus without complications: Secondary | ICD-10-CM | POA: Diagnosis not present

## 2015-11-07 DIAGNOSIS — E7800 Pure hypercholesterolemia, unspecified: Secondary | ICD-10-CM | POA: Diagnosis not present

## 2015-11-24 DIAGNOSIS — R69 Illness, unspecified: Secondary | ICD-10-CM | POA: Diagnosis not present

## 2015-12-20 DIAGNOSIS — M1712 Unilateral primary osteoarthritis, left knee: Secondary | ICD-10-CM | POA: Diagnosis not present

## 2015-12-20 DIAGNOSIS — M1711 Unilateral primary osteoarthritis, right knee: Secondary | ICD-10-CM | POA: Diagnosis not present

## 2016-01-05 DIAGNOSIS — R69 Illness, unspecified: Secondary | ICD-10-CM | POA: Diagnosis not present

## 2016-01-25 DIAGNOSIS — M47819 Spondylosis without myelopathy or radiculopathy, site unspecified: Secondary | ICD-10-CM | POA: Diagnosis not present

## 2016-01-25 DIAGNOSIS — E785 Hyperlipidemia, unspecified: Secondary | ICD-10-CM | POA: Diagnosis not present

## 2016-01-25 DIAGNOSIS — I10 Essential (primary) hypertension: Secondary | ICD-10-CM | POA: Diagnosis not present

## 2016-01-25 DIAGNOSIS — K219 Gastro-esophageal reflux disease without esophagitis: Secondary | ICD-10-CM | POA: Diagnosis not present

## 2016-01-25 DIAGNOSIS — Z7984 Long term (current) use of oral hypoglycemic drugs: Secondary | ICD-10-CM | POA: Diagnosis not present

## 2016-01-25 DIAGNOSIS — M170 Bilateral primary osteoarthritis of knee: Secondary | ICD-10-CM | POA: Diagnosis not present

## 2016-01-25 DIAGNOSIS — Z87891 Personal history of nicotine dependence: Secondary | ICD-10-CM | POA: Diagnosis not present

## 2016-01-25 DIAGNOSIS — Z6826 Body mass index (BMI) 26.0-26.9, adult: Secondary | ICD-10-CM | POA: Diagnosis not present

## 2016-01-25 DIAGNOSIS — E119 Type 2 diabetes mellitus without complications: Secondary | ICD-10-CM | POA: Diagnosis not present

## 2016-01-25 DIAGNOSIS — Z8249 Family history of ischemic heart disease and other diseases of the circulatory system: Secondary | ICD-10-CM | POA: Diagnosis not present

## 2016-02-02 DIAGNOSIS — J329 Chronic sinusitis, unspecified: Secondary | ICD-10-CM | POA: Diagnosis not present

## 2016-02-12 DIAGNOSIS — R69 Illness, unspecified: Secondary | ICD-10-CM | POA: Diagnosis not present

## 2016-03-14 DIAGNOSIS — R69 Illness, unspecified: Secondary | ICD-10-CM | POA: Diagnosis not present

## 2016-03-23 DIAGNOSIS — H2513 Age-related nuclear cataract, bilateral: Secondary | ICD-10-CM | POA: Diagnosis not present

## 2016-03-23 DIAGNOSIS — H25013 Cortical age-related cataract, bilateral: Secondary | ICD-10-CM | POA: Diagnosis not present

## 2016-03-27 DIAGNOSIS — M1712 Unilateral primary osteoarthritis, left knee: Secondary | ICD-10-CM | POA: Diagnosis not present

## 2016-03-27 DIAGNOSIS — M1711 Unilateral primary osteoarthritis, right knee: Secondary | ICD-10-CM | POA: Diagnosis not present

## 2016-04-02 DIAGNOSIS — R69 Illness, unspecified: Secondary | ICD-10-CM | POA: Diagnosis not present

## 2016-05-09 DIAGNOSIS — Z1159 Encounter for screening for other viral diseases: Secondary | ICD-10-CM | POA: Diagnosis not present

## 2016-05-09 DIAGNOSIS — E119 Type 2 diabetes mellitus without complications: Secondary | ICD-10-CM | POA: Diagnosis not present

## 2016-05-09 DIAGNOSIS — K219 Gastro-esophageal reflux disease without esophagitis: Secondary | ICD-10-CM | POA: Diagnosis not present

## 2016-05-09 DIAGNOSIS — J3089 Other allergic rhinitis: Secondary | ICD-10-CM | POA: Diagnosis not present

## 2016-05-09 DIAGNOSIS — M179 Osteoarthritis of knee, unspecified: Secondary | ICD-10-CM | POA: Diagnosis not present

## 2016-05-09 DIAGNOSIS — E663 Overweight: Secondary | ICD-10-CM | POA: Diagnosis not present

## 2016-05-09 DIAGNOSIS — Z1211 Encounter for screening for malignant neoplasm of colon: Secondary | ICD-10-CM | POA: Diagnosis not present

## 2016-05-09 DIAGNOSIS — I10 Essential (primary) hypertension: Secondary | ICD-10-CM | POA: Diagnosis not present

## 2016-05-09 DIAGNOSIS — Z0000 Encounter for general adult medical examination without abnormal findings: Secondary | ICD-10-CM | POA: Diagnosis not present

## 2016-05-09 DIAGNOSIS — Z6828 Body mass index (BMI) 28.0-28.9, adult: Secondary | ICD-10-CM | POA: Diagnosis not present

## 2016-05-17 DIAGNOSIS — R69 Illness, unspecified: Secondary | ICD-10-CM | POA: Diagnosis not present

## 2016-06-19 DIAGNOSIS — M4316 Spondylolisthesis, lumbar region: Secondary | ICD-10-CM | POA: Diagnosis not present

## 2016-06-19 DIAGNOSIS — M1712 Unilateral primary osteoarthritis, left knee: Secondary | ICD-10-CM | POA: Diagnosis not present

## 2016-06-19 DIAGNOSIS — M5441 Lumbago with sciatica, right side: Secondary | ICD-10-CM | POA: Diagnosis not present

## 2016-06-19 DIAGNOSIS — M5442 Lumbago with sciatica, left side: Secondary | ICD-10-CM | POA: Diagnosis not present

## 2016-06-19 DIAGNOSIS — M545 Low back pain: Secondary | ICD-10-CM | POA: Diagnosis not present

## 2016-06-19 DIAGNOSIS — M1711 Unilateral primary osteoarthritis, right knee: Secondary | ICD-10-CM | POA: Diagnosis not present

## 2016-07-02 DIAGNOSIS — H6980 Other specified disorders of Eustachian tube, unspecified ear: Secondary | ICD-10-CM | POA: Diagnosis not present

## 2016-07-02 DIAGNOSIS — R0981 Nasal congestion: Secondary | ICD-10-CM | POA: Diagnosis not present

## 2016-07-02 DIAGNOSIS — R0982 Postnasal drip: Secondary | ICD-10-CM | POA: Diagnosis not present

## 2016-07-06 DIAGNOSIS — R69 Illness, unspecified: Secondary | ICD-10-CM | POA: Diagnosis not present

## 2016-08-24 DIAGNOSIS — R69 Illness, unspecified: Secondary | ICD-10-CM | POA: Diagnosis not present

## 2016-08-30 DIAGNOSIS — R69 Illness, unspecified: Secondary | ICD-10-CM | POA: Diagnosis not present

## 2016-09-11 DIAGNOSIS — M1712 Unilateral primary osteoarthritis, left knee: Secondary | ICD-10-CM | POA: Diagnosis not present

## 2016-09-11 DIAGNOSIS — M1711 Unilateral primary osteoarthritis, right knee: Secondary | ICD-10-CM | POA: Diagnosis not present

## 2016-10-02 DIAGNOSIS — R69 Illness, unspecified: Secondary | ICD-10-CM | POA: Diagnosis not present

## 2016-10-04 DIAGNOSIS — Z780 Asymptomatic menopausal state: Secondary | ICD-10-CM | POA: Diagnosis not present

## 2016-10-04 DIAGNOSIS — M8588 Other specified disorders of bone density and structure, other site: Secondary | ICD-10-CM | POA: Diagnosis not present

## 2016-10-06 DIAGNOSIS — K219 Gastro-esophageal reflux disease without esophagitis: Secondary | ICD-10-CM | POA: Diagnosis not present

## 2016-10-06 DIAGNOSIS — R0789 Other chest pain: Secondary | ICD-10-CM | POA: Diagnosis not present

## 2016-10-06 DIAGNOSIS — R05 Cough: Secondary | ICD-10-CM | POA: Diagnosis not present

## 2016-11-01 DIAGNOSIS — R69 Illness, unspecified: Secondary | ICD-10-CM | POA: Diagnosis not present

## 2016-11-09 DIAGNOSIS — R69 Illness, unspecified: Secondary | ICD-10-CM | POA: Diagnosis not present

## 2016-11-15 DIAGNOSIS — I10 Essential (primary) hypertension: Secondary | ICD-10-CM | POA: Diagnosis not present

## 2016-11-15 DIAGNOSIS — Z79899 Other long term (current) drug therapy: Secondary | ICD-10-CM | POA: Diagnosis not present

## 2016-11-15 DIAGNOSIS — K219 Gastro-esophageal reflux disease without esophagitis: Secondary | ICD-10-CM | POA: Diagnosis not present

## 2016-11-15 DIAGNOSIS — M85852 Other specified disorders of bone density and structure, left thigh: Secondary | ICD-10-CM | POA: Diagnosis not present

## 2016-11-15 DIAGNOSIS — E663 Overweight: Secondary | ICD-10-CM | POA: Diagnosis not present

## 2016-11-15 DIAGNOSIS — J3089 Other allergic rhinitis: Secondary | ICD-10-CM | POA: Diagnosis not present

## 2016-11-15 DIAGNOSIS — E119 Type 2 diabetes mellitus without complications: Secondary | ICD-10-CM | POA: Diagnosis not present

## 2016-11-15 DIAGNOSIS — Z7984 Long term (current) use of oral hypoglycemic drugs: Secondary | ICD-10-CM | POA: Diagnosis not present

## 2016-11-15 DIAGNOSIS — E7800 Pure hypercholesterolemia, unspecified: Secondary | ICD-10-CM | POA: Diagnosis not present

## 2016-11-15 DIAGNOSIS — Z6827 Body mass index (BMI) 27.0-27.9, adult: Secondary | ICD-10-CM | POA: Diagnosis not present

## 2016-11-28 DIAGNOSIS — R69 Illness, unspecified: Secondary | ICD-10-CM | POA: Diagnosis not present

## 2016-12-13 DIAGNOSIS — M1712 Unilateral primary osteoarthritis, left knee: Secondary | ICD-10-CM | POA: Diagnosis not present

## 2016-12-13 DIAGNOSIS — M1711 Unilateral primary osteoarthritis, right knee: Secondary | ICD-10-CM | POA: Diagnosis not present

## 2017-01-04 DIAGNOSIS — R69 Illness, unspecified: Secondary | ICD-10-CM | POA: Diagnosis not present

## 2017-01-05 DIAGNOSIS — R69 Illness, unspecified: Secondary | ICD-10-CM | POA: Diagnosis not present

## 2017-01-30 DIAGNOSIS — R69 Illness, unspecified: Secondary | ICD-10-CM | POA: Diagnosis not present

## 2017-02-25 DIAGNOSIS — E7800 Pure hypercholesterolemia, unspecified: Secondary | ICD-10-CM | POA: Diagnosis not present

## 2017-02-25 DIAGNOSIS — E559 Vitamin D deficiency, unspecified: Secondary | ICD-10-CM | POA: Diagnosis not present

## 2017-02-25 DIAGNOSIS — I10 Essential (primary) hypertension: Secondary | ICD-10-CM | POA: Diagnosis not present

## 2017-02-25 DIAGNOSIS — E119 Type 2 diabetes mellitus without complications: Secondary | ICD-10-CM | POA: Diagnosis not present

## 2017-02-25 DIAGNOSIS — R05 Cough: Secondary | ICD-10-CM | POA: Diagnosis not present

## 2017-02-25 DIAGNOSIS — K219 Gastro-esophageal reflux disease without esophagitis: Secondary | ICD-10-CM | POA: Diagnosis not present

## 2017-02-25 DIAGNOSIS — Z7984 Long term (current) use of oral hypoglycemic drugs: Secondary | ICD-10-CM | POA: Diagnosis not present

## 2017-03-15 DIAGNOSIS — H5203 Hypermetropia, bilateral: Secondary | ICD-10-CM | POA: Diagnosis not present

## 2017-03-15 DIAGNOSIS — H25813 Combined forms of age-related cataract, bilateral: Secondary | ICD-10-CM | POA: Diagnosis not present

## 2017-03-15 DIAGNOSIS — H524 Presbyopia: Secondary | ICD-10-CM | POA: Diagnosis not present

## 2017-03-15 DIAGNOSIS — E119 Type 2 diabetes mellitus without complications: Secondary | ICD-10-CM | POA: Diagnosis not present

## 2017-03-19 DIAGNOSIS — M1711 Unilateral primary osteoarthritis, right knee: Secondary | ICD-10-CM | POA: Diagnosis not present

## 2017-03-19 DIAGNOSIS — M25552 Pain in left hip: Secondary | ICD-10-CM | POA: Diagnosis not present

## 2017-03-19 DIAGNOSIS — M1712 Unilateral primary osteoarthritis, left knee: Secondary | ICD-10-CM | POA: Diagnosis not present

## 2017-03-29 DIAGNOSIS — R69 Illness, unspecified: Secondary | ICD-10-CM | POA: Diagnosis not present

## 2017-04-03 DIAGNOSIS — R030 Elevated blood-pressure reading, without diagnosis of hypertension: Secondary | ICD-10-CM | POA: Diagnosis not present

## 2017-04-03 DIAGNOSIS — I10 Essential (primary) hypertension: Secondary | ICD-10-CM | POA: Diagnosis not present

## 2017-05-02 DIAGNOSIS — R69 Illness, unspecified: Secondary | ICD-10-CM | POA: Diagnosis not present

## 2017-05-10 DIAGNOSIS — Z0000 Encounter for general adult medical examination without abnormal findings: Secondary | ICD-10-CM | POA: Diagnosis not present

## 2017-05-10 DIAGNOSIS — I10 Essential (primary) hypertension: Secondary | ICD-10-CM | POA: Diagnosis not present

## 2017-05-10 DIAGNOSIS — E7800 Pure hypercholesterolemia, unspecified: Secondary | ICD-10-CM | POA: Diagnosis not present

## 2017-05-10 DIAGNOSIS — M8580 Other specified disorders of bone density and structure, unspecified site: Secondary | ICD-10-CM | POA: Diagnosis not present

## 2017-05-10 DIAGNOSIS — M179 Osteoarthritis of knee, unspecified: Secondary | ICD-10-CM | POA: Diagnosis not present

## 2017-05-10 DIAGNOSIS — E559 Vitamin D deficiency, unspecified: Secondary | ICD-10-CM | POA: Diagnosis not present

## 2017-05-10 DIAGNOSIS — E663 Overweight: Secondary | ICD-10-CM | POA: Diagnosis not present

## 2017-05-10 DIAGNOSIS — E119 Type 2 diabetes mellitus without complications: Secondary | ICD-10-CM | POA: Diagnosis not present

## 2017-05-31 DIAGNOSIS — R69 Illness, unspecified: Secondary | ICD-10-CM | POA: Diagnosis not present

## 2017-06-06 DIAGNOSIS — M1712 Unilateral primary osteoarthritis, left knee: Secondary | ICD-10-CM | POA: Diagnosis not present

## 2017-06-06 DIAGNOSIS — M1711 Unilateral primary osteoarthritis, right knee: Secondary | ICD-10-CM | POA: Diagnosis not present

## 2017-06-13 ENCOUNTER — Other Ambulatory Visit: Payer: Self-pay | Admitting: Orthopedic Surgery

## 2017-06-14 ENCOUNTER — Encounter (HOSPITAL_COMMUNITY): Payer: Self-pay

## 2017-06-14 ENCOUNTER — Other Ambulatory Visit (HOSPITAL_COMMUNITY): Payer: Self-pay | Admitting: *Deleted

## 2017-06-14 NOTE — Patient Instructions (Addendum)
Connie Branch  06/14/2017   Your procedure is scheduled on: Friday 06-21-17  Report to [facility] Main  Entrance Follow signs to Short Stay on first floor at 530 AM  Call this number if you have problems the morning of surgery [phone]   Remember: Do not eat food or drink liquids :After Midnight.     Take these medicines the morning of surgery with A SIP OF WATER: SIMVASTATIN (ZOCOR), OMEPRAZOLE (PRILOSEC) DO NOT TAKE ANY DIABETIC MEDICATIONS DAY OF YOUR SURGERY                How to Manage Your Diabetes Before and After Surgery  Why is it important to control my blood sugar before and after surgery? . Improving blood sugar levels before and after surgery helps healing and can limit problems. . A way of improving blood sugar control is eating a healthy diet by: o  Eating less sugar and carbohydrates o  Increasing activity/exercise o  Talking with your doctor about reaching your blood sugar goals . High blood sugars (greater than 180 mg/dL) can raise your risk of infections and slow your recovery, so you will need to focus on controlling your diabetes during the weeks before surgery. . Make sure that the doctor who takes care of your diabetes knows about your planned surgery including the date and location.  How do I manage my blood sugar before surgery? . Check your blood sugar at least 4 times a day, starting 2 days before surgery, to make sure that the level is not too high or low. o Check your blood sugar the morning of your surgery when you wake up and every 2 hours until you get to the Short Stay unit. . If your blood sugar is less than 70 mg/dL, you will need to treat for low blood sugar: o Do not take insulin. o Treat a low blood sugar (less than 70 mg/dL) with  cup of clear juice (cranberry or apple), 4 glucose tablets, OR glucose gel. o Recheck blood sugar in 15 minutes after treatment (to make sure it is greater than 70 mg/dL). If your blood  sugar is not greater than 70 mg/dL on recheck, call [phone] for further instructions. . Report your blood sugar to the short stay nurse when you get to Short Stay.  . If you are admitted to the hospital after surgery: o Your blood sugar will be checked by the staff and you will probably be given insulin after surgery (instead of oral diabetes medicines) to make sure you have good blood sugar levels. o The goal for blood sugar control after surgery is 80-180 mg/dL.   WHAT DO I DO ABOUT MY DIABETES MEDICATION?  Marland Kitchen Do not take oral diabetes medicines (pills) the morning of surgery.  . THE DAY  BEFORE SURGERY 06-20-17 TAKE YOUR METFORMIN AS USUAL     THE MORNING OF SURGERY 06-21-17. DO NOT TAKE YOUR METFORMIN  .   [place] - Preparing for Surgery Before surgery, you can play an important role.  Because skin is not sterile, your skin needs to be as free of germs as possible.  You can reduce the number of germs on your skin by washing with CHG (chlorahexidine gluconate) soap before surgery.  CHG is an antiseptic cleaner which kills germs and bonds with the skin to continue killing germs even after washing. Please DO NOT use if you  have an allergy to CHG or antibacterial soaps.  If your skin becomes reddened/irritated stop using the CHG and inform your nurse when you arrive at Short Stay. Do not shave (including legs and underarms) for at least 48 hours prior to the first CHG shower.  You may shave your face/neck. Please follow these instructions carefully:  1.  Shower with CHG Soap the night before surgery and the  morning of Surgery.  2.  If you choose to wash your hair, wash your hair first as usual with your  normal  shampoo.  3.  After you shampoo, rinse your hair and body thoroughly to remove the  shampoo.                           4.  Use CHG as you would any other liquid soap.  You can apply chg directly  to the skin and wash                       Gently with a scrungie or clean  washcloth.  5.  Apply the CHG Soap to your body ONLY FROM THE NECK DOWN.   Do not use on face/ open                           Wound or open sores. Avoid contact with eyes, ears mouth and genitals (private parts).                       Wash face,  Genitals (private parts) with your normal soap.             6.  Wash thoroughly, paying special attention to the area where your surgery  will be performed.  7.  Thoroughly rinse your body with warm water from the neck down.  8.  DO NOT shower/wash with your normal soap after using and rinsing off  the CHG Soap.                9.  Pat yourself dry with a clean towel.            10.  Wear clean pajamas.            11.  Place clean sheets on your bed the night of your first shower and do not  sleep with pets. Day of Surgery : Do not apply any lotions/deodorants the morning of surgery.  Please wear clean clothes to the hospital/surgery center.  FAILURE TO FOLLOW THESE INSTRUCTIONS MAY RESULT IN THE CANCELLATION OF YOUR SURGERY PATIENT SIGNATURE_________________________________  NURSE SIGNATURE__________________________________  ________________________________________________________________________    Connie Branch  An incentive spirometer is a tool that can help keep your lungs clear and active. This tool measures how well you are filling your lungs with each breath. Taking long deep breaths may help reverse or decrease the chance of developing breathing (pulmonary) problems (especially infection) following:  A long period of time when you are unable to move or be active. BEFORE THE PROCEDURE   If the spirometer includes an indicator to show your best effort, your nurse or respiratory therapist will set it to a desired goal.  If possible, sit up straight or lean slightly forward. Try not to slouch.  Hold the incentive spirometer in an upright position. INSTRUCTIONS FOR USE  1. Sit on the edge of your bed if possible, or sit up  as far  as you can in bed or on a chair. 2. Hold the incentive spirometer in an upright position. 3. Breathe out normally. 4. Place the mouthpiece in your mouth and seal your lips tightly around it. 5. Breathe in slowly and as deeply as possible, raising the piston or the ball toward the top of the column. 6. Hold your breath for 3-5 seconds or for as long as possible. Allow the piston or ball to fall to the bottom of the column. 7. Remove the mouthpiece from your mouth and breathe out normally. 8. Rest for a few seconds and repeat Steps 1 through 7 at least 10 times every 1-2 hours when you are awake. Take your time and take a few normal breaths between deep breaths. 9. The spirometer may include an indicator to show your best effort. Use the indicator as a goal to work toward during each repetition. 10. After each set of 10 deep breaths, practice coughing to be sure your lungs are clear. If you have an incision (the cut made at the time of surgery), support your incision when coughing by placing a pillow or rolled up towels firmly against it. Once you are able to get out of bed, walk around indoors and cough well. You may stop using the incentive spirometer when instructed by your caregiver.  RISKS AND COMPLICATIONS  Take your time so you do not get dizzy or light-headed.  If you are in pain, you may need to take or ask for pain medication before doing incentive spirometry. It is harder to take a deep breath if you are having pain. AFTER USE  Rest and breathe slowly and easily.  It can be helpful to keep track of a log of your progress. Your caregiver can provide you with a simple table to help with this. If you are using the spirometer at home, follow these instructions: SEEK MEDICAL CARE IF:   You are having difficultly using the spirometer.  You have trouble using the spirometer as often as instructed.  Your pain medication is not giving enough relief while using the spirometer.  You  develop fever of 100.5 F (38.1 C) or higher. SEEK IMMEDIATE MEDICAL CARE IF:   You cough up bloody sputum that had not been present before.  You develop fever of 102 F (38.9 C) or greater.  You develop worsening pain at or near the incision site. MAKE SURE YOU:   Understand these instructions.  Will watch your condition.  Will get help right away if you are not doing well or get worse. Document Released: 09/03/2006 Document Revised: 07/16/2011 Document Reviewed: 11/04/2006 ExitCare Patient Information 2014 ExitCare, [place].   ________________________________________________________________________  WHAT IS A BLOOD TRANSFUSION? Blood Transfusion Information  A transfusion is the replacement of blood or some of its parts. Blood is made up of multiple cells which provide different functions.  Red blood cells carry oxygen and are used for blood loss replacement.  White blood cells fight against infection.  Platelets control bleeding.  Plasma helps clot blood.  Other blood products are available for specialized needs, such as hemophilia or other clotting disorders. BEFORE THE TRANSFUSION  Who gives blood for transfusions?   Healthy volunteers who are fully evaluated to make sure their blood is safe. This is blood bank blood. Transfusion therapy is the safest it has ever been in the practice of medicine. Before blood is taken from a donor, a complete history is taken to make sure that person has no  history of diseases nor engages in risky social behavior (examples are intravenous drug use or sexual activity with multiple partners). The donor's travel history is screened to minimize risk of transmitting infections, such as malaria. The donated blood is tested for signs of infectious diseases, such as HIV and hepatitis. The blood is then tested to be sure it is compatible with you in order to minimize the chance of a transfusion reaction. If you or a relative donates blood, this is  often done in anticipation of surgery and is not appropriate for emergency situations. It takes many days to process the donated blood. RISKS AND COMPLICATIONS Although transfusion therapy is very safe and saves many lives, the main dangers of transfusion include:   Getting an infectious disease.  Developing a transfusion reaction. This is an allergic reaction to something in the blood you were given. Every precaution is taken to prevent this. The decision to have a blood transfusion has been considered carefully by your caregiver before blood is given. Blood is not given unless the benefits outweigh the risks. AFTER THE TRANSFUSION  Right after receiving a blood transfusion, you will usually feel much better and more energetic. This is especially true if your red blood cells have gotten low (anemic). The transfusion raises the level of the red blood cells which carry oxygen, and this usually causes an energy increase.  The nurse administering the transfusion will monitor you carefully for complications. HOME CARE INSTRUCTIONS  No special instructions are needed after a transfusion. You may find your energy is better. Speak with your caregiver about any limitations on activity for underlying diseases you may have. SEEK MEDICAL CARE IF:   Your condition is not improving after your transfusion.  You develop redness or irritation at the intravenous (IV) site. SEEK IMMEDIATE MEDICAL CARE IF:  Any of the following symptoms occur over the next 12 hours:  Shaking chills.  You have a temperature by mouth above 102 F (38.9 C), not controlled by medicine.  Chest, back, or muscle pain.  People around you feel you are not acting correctly or are confused.  Shortness of breath or difficulty breathing.  Dizziness and fainting.  You get a rash or develop hives.  You have a decrease in urine output.  Your urine turns a dark color or changes to pink, red, or brown. Any of the following  symptoms occur over the next 10 days:  You have a temperature by mouth above 102 F (38.9 C), not controlled by medicine.  Shortness of breath.  Weakness after normal activity.  The white part of the eye turns yellow (jaundice).  You have a decrease in the amount of urine or are urinating less often.  Your urine turns a dark color or changes to pink, red, or brown. Document Released: 04/20/2000 Document Revised: 07/16/2011 Document Reviewed: 12/08/2007 [facility] Patient Information 2014 [place], [place].  _______________________________________________________________________

## 2017-06-17 ENCOUNTER — Encounter (HOSPITAL_COMMUNITY): Payer: Self-pay

## 2017-06-17 ENCOUNTER — Encounter (HOSPITAL_COMMUNITY)
Admission: RE | Admit: 2017-06-17 | Discharge: 2017-06-17 | Disposition: A | Discharge status: Home / Self Care | Payer: Medicare HMO | Source: Ambulatory Visit | Attending: Orthopedic Surgery | Admitting: Orthopedic Surgery

## 2017-06-17 ENCOUNTER — Other Ambulatory Visit: Payer: Self-pay

## 2017-06-17 DIAGNOSIS — Z01810 Encounter for preprocedural cardiovascular examination: Secondary | ICD-10-CM | POA: Insufficient documentation

## 2017-06-17 DIAGNOSIS — Z79899 Other long term (current) drug therapy: Secondary | ICD-10-CM | POA: Insufficient documentation

## 2017-06-17 DIAGNOSIS — Z01812 Encounter for preprocedural laboratory examination: Secondary | ICD-10-CM | POA: Insufficient documentation

## 2017-06-17 DIAGNOSIS — R51 Headache: Secondary | ICD-10-CM | POA: Diagnosis not present

## 2017-06-17 DIAGNOSIS — I10 Essential (primary) hypertension: Secondary | ICD-10-CM | POA: Diagnosis not present

## 2017-06-17 DIAGNOSIS — M1990 Unspecified osteoarthritis, unspecified site: Secondary | ICD-10-CM | POA: Insufficient documentation

## 2017-06-17 DIAGNOSIS — Z7984 Long term (current) use of oral hypoglycemic drugs: Secondary | ICD-10-CM | POA: Diagnosis not present

## 2017-06-17 DIAGNOSIS — K219 Gastro-esophageal reflux disease without esophagitis: Secondary | ICD-10-CM | POA: Insufficient documentation

## 2017-06-17 DIAGNOSIS — R001 Bradycardia, unspecified: Secondary | ICD-10-CM | POA: Insufficient documentation

## 2017-06-17 DIAGNOSIS — E119 Type 2 diabetes mellitus without complications: Secondary | ICD-10-CM | POA: Diagnosis present

## 2017-06-17 HISTORY — DX: Headache, unspecified: R51.9

## 2017-06-17 HISTORY — DX: Unspecified cataract: H26.9

## 2017-06-17 HISTORY — DX: Headache: R51

## 2017-06-17 LAB — BASIC METABOLIC PANEL
ANION GAP: 9 (ref 5–15)
BUN: 21 mg/dL — ABNORMAL HIGH (ref 6–20)
CHLORIDE: 103 mmol/L (ref 101–111)
CO2: 26 mmol/L (ref 22–32)
CREATININE: 0.79 mg/dL (ref 0.44–1.00)
Calcium: 9.9 mg/dL (ref 8.9–10.3)
GFR calc non Af Amer: >60 mL/min (ref >60)
Glucose, Bld: 96 mg/dL (ref 65–99)
POTASSIUM: 4.2 mmol/L (ref 3.5–5.1)
SODIUM: 138 mmol/L (ref 135–145)

## 2017-06-17 LAB — SURGICAL PCR SCREEN
MRSA, PCR: NEGATIVE
Staphylococcus aureus: POSITIVE — AB

## 2017-06-17 LAB — HEMOGLOBIN A1C
Hgb A1c MFr Bld: 5.4 % (ref 4.8–5.6)
MEAN PLASMA GLUCOSE: 108.28 mg/dL

## 2017-06-17 LAB — CBC
HEMATOCRIT: 36.8 % (ref 36.0–46.0)
Hemoglobin: 12 g/dL (ref 12.0–15.0)
MCH: 29.6 pg (ref 26.0–34.0)
MCHC: 32.6 g/dL (ref 30.0–36.0)
MCV: 90.6 fL (ref 78.0–100.0)
Platelets: 144 10*3/uL — ABNORMAL LOW (ref 150–400)
RBC: 4.06 MIL/uL (ref 3.87–5.11)
RDW: 14.4 % (ref 11.5–15.5)
WBC: 6.1 10*3/uL (ref 4.0–10.5)

## 2017-06-17 LAB — GLUCOSE, CAPILLARY: GLUCOSE-CAPILLARY: 108 mg/dL — AB (ref 65–99)

## 2017-06-20 MED ORDER — TRANEXAMIC ACID 1000 MG/10ML IV SOLN
2000.0000 mg | INTRAVENOUS | Status: DC
  Filled 2017-06-20: qty 20

## 2017-06-20 NOTE — Anesthesia Preprocedure Evaluation (Signed)
Anesthesia Evaluation  Patient identified by MRN, date of birth, ID band Patient awake    Reviewed: Allergy & Precautions, NPO status , Patient's Chart, lab work & pertinent test results  Airway Mallampati: II  TM Distance: >3 FB Neck ROM: Full    Dental no notable dental hx.    Pulmonary neg pulmonary ROS, former smoker,    Pulmonary exam normal breath sounds clear to auscultation       Cardiovascular hypertension, Pt. on medications Normal cardiovascular exam Rhythm:Regular Rate:Normal     Neuro/Psych  Headaches, negative psych ROS   GI/Hepatic Neg liver ROS, GERD  Medicated,  Endo/Other  diabetes, Oral Hypoglycemic AgentsMorbid obesity  Renal/GU negative Renal ROS  negative genitourinary   Musculoskeletal  (+) Arthritis , Osteoarthritis,    Abdominal   Peds negative pediatric ROS (+)  Hematology negative hematology ROS (+)   Anesthesia Other Findings   Reproductive/Obstetrics negative OB ROS                             Anesthesia Physical Anesthesia Plan  ASA: III  Anesthesia Plan: Spinal and Regional   Post-op Pain Management:  Regional for Post-op pain   Induction:   PONV Risk Score and Plan: 2 and Treatment may vary due to age or medical condition  Airway Management Planned: Nasal Cannula, Natural Airway and Mask  Additional Equipment:   Intra-op Plan:   Post-operative Plan:   Informed Consent: I have reviewed the patients History and Physical, chart, labs and discussed the procedure including the risks, benefits and alternatives for the proposed anesthesia with the patient or authorized representative who has indicated his/her understanding and acceptance.     Plan Discussed with: Anesthesiologist and CRNA  Anesthesia Plan Comments: (  )       Anesthesia Quick Evaluation

## 2017-06-20 NOTE — H&P (Addendum)
TOTAL KNEE ADMISSION H&P  Patient is being admitted for right total knee arthroplasty.  Subjective:  Chief Complaint:right knee pain.  HPI: Connie Branch, 67 y.o. female, has a history of pain and functional disability in the right knee due to arthritis and has failed non-surgical conservative treatments for greater than 12 weeks to includeNSAID's and/or analgesics, corticosteriod injections, viscosupplementation injections, use of assistive devices, weight reduction as appropriate and activity modification.  Onset of symptoms was gradual, starting 4 years ago with gradually worsening course since that time. The patient noted no past surgery on the right knee(s).  Patient currently rates pain in the right knee(s) at 7 out of 10 with activity. Patient has night pain, worsening of pain with activity and weight bearing, pain that interferes with activities of daily living, pain with passive range of motion, crepitus and joint swelling.  Patient has evidence of subchondral sclerosis, periarticular osteophytes and joint space narrowing by imaging studies. This patient has had failure of all reasonable conservative care. There is no active infection.  Patient Active Problem List   Diagnosis Date Noted  . DM (diabetes mellitus), secondary, uncontrolled, with ketoacidosis ([place]) 05/23/2013  . Hypokalemia 05/22/2013  . Obesity (BMI 30-39.9) 05/22/2013  . DKA (diabetic ketoacidoses) ([place]) 05/21/2013  . Muscle spasm 05/21/2013  . Hyponatremia 05/21/2013  . ARF (acute renal failure) secondary to dehydration from osmotic diuresis 05/21/2013  . HTN (hypertension) 05/21/2013  . Hyperlipidemia 05/21/2013  . GERD (gastroesophageal reflux disease) 05/21/2013  . Infection due to trichomonas 05/21/2013   Past Medical History:  Diagnosis Date  . ARF (acute renal failure) secondary to dehydration from osmotic diuresis 05/21/2013   KIDNEY FUNCTION NORMAL NOW  . Arthritis   . Cataracts, bilateral   . DDD  (degenerative disc disease)   . DKA (diabetic ketoacidoses) ([place]) 05/21/2013   TYPE 2  . GERD (gastroesophageal reflux disease)   . Headache    SINUS  . High cholesterol   . Hypertension     Past Surgical History:  Procedure Laterality Date  . ABDOMINAL HYSTERECTOMY     COMPLETE  . OVARIAN CYST AND OVARY REMOVED  YRS AGO    Current Facility-Administered Medications  Medication Dose Route Frequency Provider Last Rate Last Dose  . [START ON 06/21/2017] tranexamic acid (CYKLOKAPRON) 2,000 mg in sodium chloride 0.9 % 50 mL Topical Application  7,425 mg Topical To OR Wofford, Cindie Laroche, RPH       Current Outpatient Medications  Medication Sig Dispense Refill Last Dose  . acetaminophen (TYLENOL) 500 MG tablet Take 1,000 mg by mouth every 6 (six) hours as needed (for pain.).     Marland Kitchen Ascorbic Acid (VITAMIN C) 1000 MG tablet Take 1,000 mg by mouth daily.     . Calcium Carbonate-Simethicone (MAALOX MAX PO) Take 5-10 mLs by mouth 3 (three) times daily as needed (for acid reflux/heartburn.).    Taking  . fluticasone (FLONASE) 50 MCG/ACT nasal spray Place 1-2 sprays into both nostrils daily as needed for allergies.     Marland Kitchen losartan-hydrochlorothiazide (HYZAAR) 100-25 MG per tablet Take 1 tablet by mouth daily.   Taking  . metFORMIN (GLUCOPHAGE) 500 MG tablet Take 500-1,000 mg by mouth daily at 6 PM. If CBG is greater than 200=2 tablets, less than 200=1 tablet (take between lunch and dinner)  0   . omeprazole (PRILOSEC) 40 MG capsule Take 40 mg by mouth daily.  1   . simvastatin (ZOCOR) 40 MG tablet Take 40 mg by mouth daily.  3   .  Vitamin D, Ergocalciferol, (DRISDOL) 50000 units CAPS capsule Take 50,000 Units by mouth every Friday.  3   . Blood Glucose Monitoring Suppl (BLOOD GLUCOSE METER) kit Use as instructed 1 each 0 Taking   Allergies  Allergen Reactions  . Erythromycin Anaphylaxis  . Zithromax [Azithromycin] Anaphylaxis  . Aspirin Nausea And Vomiting  . Penicillins Other (See Comments)     Childhood allergy Has patient had a PCN reaction causing immediate rash, facial/tongue/throat swelling, SOB or lightheadedness with hypotension: Unknown Has patient had a PCN reaction causing severe rash involving mucus membranes or skin necrosis: Unknown Has patient had a PCN reaction that required hospitalization:Unknown Has patient had a PCN reaction occurring within the last 10 years:No If all of the above answers are "NO", then may proceed with Cephalosporin use.     Social History   Tobacco Use  . Smoking status: Former Smoker    Packs/day: 0.25    Years: 50.00    Pack years: 12.50    Types: Cigarettes    Last attempt to quit: 05/07/2013    Years since quitting: 4.1  . Smokeless tobacco: Never Used  Substance Use Topics  . Alcohol use: No    Frequency: Never    Family History  Problem Relation Age of Onset  . Cancer Brother      ROS .ros ROS: I have reviewed the patient's review of systems thoroughly and there are no positive responses as relates to the HPI.Objective:  Physical Exam  Vital signs in last 24 hours:    Vitals:   06/21/17 0602  BP: (!) 166/84  Pulse: 81  Resp: 16  Temp: 98.4 F (36.9 C)  SpO2: 98%    Well-developed well-nourished patient in no acute distress. Alert and oriented x3 HEENT:within normal limits Cardiac: Regular rate and rhythm Pulmonary: Lungs clear to auscultation Abdomen: Soft and nontender.  Normal active bowel sounds  Musculoskeletal: r knee limited rom painful rom trace effusion rom 0-120 Labs: Recent Results (from the past 2160 hour(s))  Surgical pcr screen     Status: Abnormal   Collection Time: 06/17/17  9:35 AM  Result Value Ref Range   MRSA, PCR NEGATIVE NEGATIVE   Staphylococcus aureus POSITIVE (A) NEGATIVE    Comment: (NOTE) The Xpert SA Assay (FDA approved for NASAL specimens in patients 18 years of age and older), is one component of a comprehensive surveillance program. It is not intended to diagnose  infection nor to guide or monitor treatment. Performed at [facility], [place] [address] [place], [place] [address]   Glucose, capillary     Status: Abnormal   Collection Time: 06/17/17  9:47 AM  Result Value Ref Range   Glucose-Capillary 108 (H) 65 - 99 mg/dL  Hemoglobin A1c     Status: None   Collection Time: 06/17/17 10:00 AM  Result Value Ref Range   Hgb A1c MFr Bld 5.4 4.8 - 5.6 %    Comment: (NOTE) Pre diabetes:          5.7%-6.4% Diabetes:              >6.4% Glycemic control for   <7.0% adults with diabetes    Mean Plasma Glucose 108.28 mg/dL    Comment: Performed at [facility] [address], [place], [place] [address]  CBC     Status: Abnormal   Collection Time: 06/17/17 10:00 AM  Result Value Ref Range   WBC 6.1 4.0 - 10.5 K/uL   RBC 4.06 3.87 -  5.11 MIL/uL   Hemoglobin 12.0 12.0 - 15.0 g/dL   HCT 36.8 36.0 - 46.0 %   MCV 90.6 78.0 - 100.0 fL   MCH 29.6 26.0 - 34.0 pg   MCHC 32.6 30.0 - 36.0 g/dL   RDW 14.4 11.5 - 15.5 %   Platelets 144 (L) 150 - 400 K/uL    Comment: Performed at [facility], [place] [address] [place],  [address]  Basic metabolic panel     Status: Abnormal   Collection Time: 06/17/17 10:00 AM  Result Value Ref Range   Sodium 138 135 - 145 mmol/L   Potassium 4.2 3.5 - 5.1 mmol/L   Chloride 103 101 - 111 mmol/L   CO2 26 22 - 32 mmol/L   Glucose, Bld 96 65 - 99 mg/dL   BUN 21 (H) 6 - 20 mg/dL   Creatinine, Ser 0.79 0.44 - 1.00 mg/dL   Calcium 9.9 8.9 - 10.3 mg/dL   GFR calc non Af Amer >60 >60 mL/min   GFR calc Af Amer >60 >60 mL/min    Comment: (NOTE) The eGFR has been calculated using the CKD EPI equation. This calculation has not been validated in all clinical situations. eGFR's persistently <60 mL/min signify possible Chronic Kidney Disease.    Anion gap 9 5 - 15    Comment: Performed at [facility], [place] [address] [place],  [address]    Estimated body  mass index is 28.01 kg/m as calculated from the following:   Height as of 06/17/17: '5\' 4"'$  (1.626 m).   Weight as of 06/17/17: 74 kg (163 lb 3.2 oz).   Imaging Review Plain radiographs demonstrate severe degenerative joint disease of the right knee(s). The overall alignment ismild varus. The bone quality appears to be fair for age and reported activity level.  Assessment/Plan:  End stage arthritis, right knee   The patient history, physical examination, clinical judgment of the provider and imaging studies are consistent with end stage degenerative joint disease of the right knee(s) and total knee arthroplasty is deemed medically necessary. The treatment options including medical management, injection therapy arthroscopy and arthroplasty were discussed at length. The risks and benefits of total knee arthroplasty were presented and reviewed. The risks due to aseptic loosening, infection, stiffness, patella tracking problems, thromboembolic complications and other imponderables were discussed. The patient acknowledged the explanation, agreed to proceed with the plan and consent was signed. Patient is being admitted for inpatient treatment for surgery, pain control, PT, OT, prophylactic antibiotics, VTE prophylaxis, progressive ambulation and ADL's and discharge planning. The patient is planning to be discharged home with home health services

## 2017-06-21 ENCOUNTER — Encounter (HOSPITAL_COMMUNITY)
Admission: RE | Disposition: A | Discharge status: Home / Self Care | Payer: Self-pay | Source: Ambulatory Visit | Attending: Orthopedic Surgery

## 2017-06-21 ENCOUNTER — Inpatient Hospital Stay (HOSPITAL_COMMUNITY)
Admission: RE | Admit: 2017-06-21 | Discharge: 2017-06-25 | DRG: 470 | Disposition: A | Discharge status: Home / Self Care | Payer: Medicare HMO | Source: Ambulatory Visit | Attending: Orthopedic Surgery | Admitting: Orthopedic Surgery

## 2017-06-21 ENCOUNTER — Other Ambulatory Visit: Payer: Self-pay

## 2017-06-21 ENCOUNTER — Encounter (HOSPITAL_COMMUNITY): Payer: Self-pay

## 2017-06-21 ENCOUNTER — Inpatient Hospital Stay (HOSPITAL_COMMUNITY): Payer: Medicare HMO | Admitting: Anesthesiology

## 2017-06-21 DIAGNOSIS — I10 Essential (primary) hypertension: Secondary | ICD-10-CM | POA: Diagnosis present

## 2017-06-21 DIAGNOSIS — Z881 Allergy status to other antibiotic agents status: Secondary | ICD-10-CM | POA: Diagnosis not present

## 2017-06-21 DIAGNOSIS — E1110 Type 2 diabetes mellitus with ketoacidosis without coma: Secondary | ICD-10-CM | POA: Diagnosis not present

## 2017-06-21 DIAGNOSIS — M1712 Unilateral primary osteoarthritis, left knee: Secondary | ICD-10-CM | POA: Diagnosis present

## 2017-06-21 DIAGNOSIS — M1711 Unilateral primary osteoarthritis, right knee: Secondary | ICD-10-CM | POA: Diagnosis not present

## 2017-06-21 DIAGNOSIS — Z7984 Long term (current) use of oral hypoglycemic drugs: Secondary | ICD-10-CM | POA: Diagnosis not present

## 2017-06-21 DIAGNOSIS — E119 Type 2 diabetes mellitus without complications: Secondary | ICD-10-CM | POA: Diagnosis not present

## 2017-06-21 DIAGNOSIS — G8918 Other acute postprocedural pain: Secondary | ICD-10-CM | POA: Diagnosis not present

## 2017-06-21 DIAGNOSIS — D62 Acute posthemorrhagic anemia: Secondary | ICD-10-CM | POA: Diagnosis not present

## 2017-06-21 DIAGNOSIS — Z886 Allergy status to analgesic agent status: Secondary | ICD-10-CM

## 2017-06-21 DIAGNOSIS — Z79899 Other long term (current) drug therapy: Secondary | ICD-10-CM

## 2017-06-21 DIAGNOSIS — E7800 Pure hypercholesterolemia, unspecified: Secondary | ICD-10-CM | POA: Diagnosis present

## 2017-06-21 DIAGNOSIS — Z880 Allergy status to penicillin: Secondary | ICD-10-CM | POA: Diagnosis not present

## 2017-06-21 DIAGNOSIS — Z90710 Acquired absence of both cervix and uterus: Secondary | ICD-10-CM

## 2017-06-21 DIAGNOSIS — Z87891 Personal history of nicotine dependence: Secondary | ICD-10-CM

## 2017-06-21 DIAGNOSIS — R269 Unspecified abnormalities of gait and mobility: Secondary | ICD-10-CM | POA: Diagnosis not present

## 2017-06-21 HISTORY — PX: TOTAL KNEE ARTHROPLASTY: SHX125

## 2017-06-21 LAB — URINALYSIS, ROUTINE W REFLEX MICROSCOPIC
BILIRUBIN URINE: NEGATIVE
Glucose, UA: NEGATIVE mg/dL
KETONES UR: 5 mg/dL — AB
LEUKOCYTES UA: NEGATIVE
NITRITE: NEGATIVE
Protein, ur: NEGATIVE mg/dL
SPECIFIC GRAVITY, URINE: 1.026 (ref 1.005–1.030)
pH: 6 (ref 5.0–8.0)

## 2017-06-21 LAB — PROTIME-INR
INR: 0.87
Prothrombin Time: 11.7 seconds (ref 11.4–15.2)

## 2017-06-21 LAB — CBC WITH DIFFERENTIAL/PLATELET
Basophils Absolute: 0 10*3/uL (ref 0.0–0.1)
Basophils Relative: 0 %
Eosinophils Absolute: 0.1 10*3/uL (ref 0.0–0.7)
Eosinophils Relative: 1 %
HEMATOCRIT: 35.6 % — AB (ref 36.0–46.0)
HEMOGLOBIN: 11.9 g/dL — AB (ref 12.0–15.0)
LYMPHS ABS: 2.3 10*3/uL (ref 0.7–4.0)
LYMPHS PCT: 35 %
MCH: 30.3 pg (ref 26.0–34.0)
MCHC: 33.4 g/dL (ref 30.0–36.0)
MCV: 90.6 fL (ref 78.0–100.0)
MONOS PCT: 4 %
Monocytes Absolute: 0.3 10*3/uL (ref 0.1–1.0)
NEUTROS ABS: 3.9 10*3/uL (ref 1.7–7.7)
NEUTROS PCT: 60 %
Platelets: 141 10*3/uL — ABNORMAL LOW (ref 150–400)
RBC: 3.93 MIL/uL (ref 3.87–5.11)
RDW: 14.4 % (ref 11.5–15.5)
WBC: 6.6 10*3/uL (ref 4.0–10.5)

## 2017-06-21 LAB — GLUCOSE, CAPILLARY
GLUCOSE-CAPILLARY: 142 mg/dL — AB (ref 65–99)
GLUCOSE-CAPILLARY: 101 mg/dL — AB (ref 65–99)
Glucose-Capillary: 143 mg/dL — ABNORMAL HIGH (ref 65–99)
Glucose-Capillary: 174 mg/dL — ABNORMAL HIGH (ref 65–99)

## 2017-06-21 LAB — APTT: aPTT: 29 seconds (ref 24–36)

## 2017-06-21 LAB — TYPE AND SCREEN
ABO/RH(D): O POS
Antibody Screen: NEGATIVE

## 2017-06-21 LAB — ABO/RH: ABO/RH(D): O POS

## 2017-06-21 SURGERY — ARTHROPLASTY, KNEE, TOTAL
Anesthesia: Regional | Site: Knee | Laterality: Right

## 2017-06-21 MED ORDER — ONDANSETRON HCL 4 MG/2ML IJ SOLN: 4.0000 mg | Freq: Once | INTRAMUSCULAR | Status: DC | PRN

## 2017-06-21 MED ORDER — PANTOPRAZOLE SODIUM 40 MG PO TBEC
80.0000 mg | DELAYED_RELEASE_TABLET | Freq: Every day | ORAL | Status: DC
  Administered 2017-06-22 – 2017-06-25 (×4): 80 mg via ORAL
  Filled 2017-06-21 (×4): qty 2

## 2017-06-21 MED ORDER — PROPOFOL 500 MG/50ML IV EMUL
INTRAVENOUS | Status: DC | PRN
  Administered 2017-06-21: 75 ug/kg/min via INTRAVENOUS

## 2017-06-21 MED ORDER — APIXABAN 2.5 MG PO TABS
2.5000 mg | ORAL_TABLET | Freq: Two times a day (BID) | ORAL | Status: DC
  Administered 2017-06-22 – 2017-06-25 (×7): 2.5 mg via ORAL
  Filled 2017-06-21 (×7): qty 1

## 2017-06-21 MED ORDER — OXYCODONE-ACETAMINOPHEN 5-325 MG PO TABS: 1.0000 | ORAL_TABLET | Freq: Four times a day (QID) | ORAL | 0 refills | Status: DC | PRN

## 2017-06-21 MED ORDER — TRANEXAMIC ACID 1000 MG/10ML IV SOLN
1000.0000 mg | Freq: Once | INTRAVENOUS | Status: AC
  Administered 2017-06-21: 1000 mg via INTRAVENOUS
  Filled 2017-06-21: qty 1100

## 2017-06-21 MED ORDER — MIDAZOLAM HCL 5 MG/5ML IJ SOLN
INTRAMUSCULAR | Status: DC | PRN
  Administered 2017-06-21: 1 mg via INTRAVENOUS
  Administered 2017-06-21: 2 mg via INTRAVENOUS

## 2017-06-21 MED ORDER — GABAPENTIN 300 MG PO CAPS
300.0000 mg | ORAL_CAPSULE | Freq: Two times a day (BID) | ORAL | Status: DC
  Administered 2017-06-21 – 2017-06-25 (×9): 300 mg via ORAL
  Filled 2017-06-21 (×9): qty 1

## 2017-06-21 MED ORDER — FENTANYL CITRATE (PF) 100 MCG/2ML IJ SOLN: 25.0000 ug | INTRAMUSCULAR | Status: DC | PRN

## 2017-06-21 MED ORDER — METHOCARBAMOL 1000 MG/10ML IJ SOLN
500.0000 mg | Freq: Four times a day (QID) | INTRAVENOUS | Status: DC | PRN
  Administered 2017-06-21: 500 mg via INTRAVENOUS
  Filled 2017-06-21: qty 550

## 2017-06-21 MED ORDER — DEXAMETHASONE SODIUM PHOSPHATE 10 MG/ML IJ SOLN
10.0000 mg | Freq: Two times a day (BID) | INTRAMUSCULAR | Status: AC
  Administered 2017-06-21 – 2017-06-22 (×2): 10 mg via INTRAVENOUS
  Filled 2017-06-21 (×2): qty 1

## 2017-06-21 MED ORDER — BUPIVACAINE IN DEXTROSE 0.75-8.25 % IT SOLN
INTRATHECAL | Status: DC | PRN
  Administered 2017-06-21: 11 mg via INTRATHECAL

## 2017-06-21 MED ORDER — DEXAMETHASONE SODIUM PHOSPHATE 10 MG/ML IJ SOLN
INTRAMUSCULAR | Status: AC
  Filled 2017-06-21: qty 1

## 2017-06-21 MED ORDER — SODIUM CHLORIDE 0.9 % IJ SOLN
INTRAMUSCULAR | Status: AC
  Filled 2017-06-21: qty 50

## 2017-06-21 MED ORDER — SODIUM CHLORIDE 0.9 % IV SOLN
INTRAVENOUS | Status: DC
  Administered 2017-06-21 (×2): via INTRAVENOUS

## 2017-06-21 MED ORDER — OXYCODONE HCL 5 MG PO TABS: 5.0000 mg | ORAL_TABLET | Freq: Once | ORAL | Status: DC | PRN

## 2017-06-21 MED ORDER — DIPHENHYDRAMINE HCL 12.5 MG/5ML PO ELIX: 12.5000 mg | ORAL_SOLUTION | ORAL | Status: DC | PRN

## 2017-06-21 MED ORDER — ACETAMINOPHEN 325 MG PO TABS: 325.0000 mg | ORAL_TABLET | ORAL | Status: DC | PRN

## 2017-06-21 MED ORDER — PROPOFOL 10 MG/ML IV BOLUS
INTRAVENOUS | Status: DC | PRN
  Administered 2017-06-21: 20 mg via INTRAVENOUS
  Administered 2017-06-21: 30 mg via INTRAVENOUS
  Administered 2017-06-21: 40 mg via INTRAVENOUS

## 2017-06-21 MED ORDER — FENTANYL CITRATE (PF) 100 MCG/2ML IJ SOLN
INTRAMUSCULAR | Status: AC
  Filled 2017-06-21: qty 2

## 2017-06-21 MED ORDER — SODIUM CHLORIDE 0.9 % IR SOLN
Status: DC | PRN
  Administered 2017-06-21: 1000 mL

## 2017-06-21 MED ORDER — MAGNESIUM CITRATE PO SOLN: 1.0000 | Freq: Once | ORAL | Status: DC | PRN

## 2017-06-21 MED ORDER — ONDANSETRON HCL 4 MG/2ML IJ SOLN
4.0000 mg | Freq: Four times a day (QID) | INTRAMUSCULAR | Status: DC | PRN
Start: 2017-06-21 — End: 2017-06-25
  Administered 2017-06-21 – 2017-06-24 (×2): 4 mg via INTRAVENOUS
  Filled 2017-06-21 (×2): qty 2

## 2017-06-21 MED ORDER — BUPIVACAINE-EPINEPHRINE 0.5% -1:200000 IJ SOLN
INTRAMUSCULAR | Status: DC | PRN
  Administered 2017-06-21: 30 mL

## 2017-06-21 MED ORDER — MIDAZOLAM HCL 2 MG/2ML IJ SOLN
INTRAMUSCULAR | Status: AC
  Filled 2017-06-21: qty 2

## 2017-06-21 MED ORDER — ONDANSETRON HCL 4 MG/2ML IJ SOLN
INTRAMUSCULAR | Status: DC | PRN
  Administered 2017-06-21: 4 mg via INTRAVENOUS

## 2017-06-21 MED ORDER — OXYCODONE HCL 5 MG/5ML PO SOLN
5.0000 mg | Freq: Once | ORAL | Status: DC | PRN
  Filled 2017-06-21: qty 5

## 2017-06-21 MED ORDER — STERILE WATER FOR IRRIGATION IR SOLN
Status: DC | PRN
  Administered 2017-06-21: 2000 mL

## 2017-06-21 MED ORDER — ACETAMINOPHEN 325 MG PO TABS
650.0000 mg | ORAL_TABLET | ORAL | Status: DC | PRN
  Administered 2017-06-22 – 2017-06-23 (×4): 650 mg via ORAL
  Filled 2017-06-21 (×4): qty 2

## 2017-06-21 MED ORDER — DEXAMETHASONE SODIUM PHOSPHATE 10 MG/ML IJ SOLN
INTRAMUSCULAR | Status: DC | PRN
  Administered 2017-06-21: 10 mg via INTRAVENOUS

## 2017-06-21 MED ORDER — FENTANYL CITRATE (PF) 100 MCG/2ML IJ SOLN: 50.0000 ug | INTRAMUSCULAR | Status: DC | PRN

## 2017-06-21 MED ORDER — FENTANYL CITRATE (PF) 100 MCG/2ML IJ SOLN
INTRAMUSCULAR | Status: DC | PRN
  Administered 2017-06-21: 100 ug via INTRAVENOUS

## 2017-06-21 MED ORDER — MIDAZOLAM HCL 2 MG/2ML IJ SOLN: 1.0000 mg | INTRAMUSCULAR | Status: DC | PRN

## 2017-06-21 MED ORDER — OXYCODONE HCL 5 MG PO TABS
5.0000 mg | ORAL_TABLET | ORAL | Status: DC | PRN
  Administered 2017-06-21 – 2017-06-22 (×4): 10 mg via ORAL
  Administered 2017-06-22: 5 mg via ORAL
  Administered 2017-06-22 – 2017-06-24 (×6): 10 mg via ORAL
  Administered 2017-06-24: 5 mg via ORAL
  Administered 2017-06-24 – 2017-06-25 (×6): 10 mg via ORAL
  Filled 2017-06-21 (×2): qty 2
  Filled 2017-06-21: qty 1
  Filled 2017-06-21 (×9): qty 2
  Filled 2017-06-21: qty 1
  Filled 2017-06-21 (×5): qty 2

## 2017-06-21 MED ORDER — MEPERIDINE HCL 50 MG/ML IJ SOLN: 6.2500 mg | INTRAMUSCULAR | Status: DC | PRN

## 2017-06-21 MED ORDER — SODIUM CHLORIDE 0.9 % IJ SOLN
INTRAMUSCULAR | Status: DC | PRN
  Administered 2017-06-21: 20 mL

## 2017-06-21 MED ORDER — TIZANIDINE HCL 2 MG PO TABS: 2.0000 mg | ORAL_TABLET | Freq: Three times a day (TID) | ORAL | 0 refills | Status: DC | PRN

## 2017-06-21 MED ORDER — LOSARTAN POTASSIUM 50 MG PO TABS
100.0000 mg | ORAL_TABLET | Freq: Every day | ORAL | Status: DC
  Administered 2017-06-21 – 2017-06-25 (×5): 100 mg via ORAL
  Filled 2017-06-21 (×5): qty 2

## 2017-06-21 MED ORDER — APIXABAN 2.5 MG PO TABS: 2.5000 mg | ORAL_TABLET | Freq: Two times a day (BID) | ORAL | 0 refills | Status: DC

## 2017-06-21 MED ORDER — DOCUSATE SODIUM 100 MG PO CAPS: 100.0000 mg | ORAL_CAPSULE | Freq: Two times a day (BID) | ORAL | 0 refills | Status: DC

## 2017-06-21 MED ORDER — BUPIVACAINE LIPOSOME 1.3 % IJ SUSP
20.0000 mL | Freq: Once | INTRAMUSCULAR | Status: AC
  Administered 2017-06-21: 20 mL
  Filled 2017-06-21: qty 20

## 2017-06-21 MED ORDER — ONDANSETRON HCL 4 MG PO TABS
4.0000 mg | ORAL_TABLET | Freq: Four times a day (QID) | ORAL | Status: DC | PRN
  Administered 2017-06-23: 4 mg via ORAL
  Filled 2017-06-21: qty 1

## 2017-06-21 MED ORDER — KETOROLAC TROMETHAMINE 30 MG/ML IJ SOLN: 30.0000 mg | Freq: Once | INTRAMUSCULAR | Status: DC | PRN

## 2017-06-21 MED ORDER — CLINDAMYCIN PHOSPHATE 900 MG/50ML IV SOLN
900.0000 mg | INTRAVENOUS | Status: AC
  Administered 2017-06-21: 900 mg via INTRAVENOUS
  Filled 2017-06-21: qty 50

## 2017-06-21 MED ORDER — TRANEXAMIC ACID 1000 MG/10ML IV SOLN
1000.0000 mg | INTRAVENOUS | Status: AC
  Administered 2017-06-21: 1000 mg via INTRAVENOUS
  Filled 2017-06-21: qty 1100

## 2017-06-21 MED ORDER — DOCUSATE SODIUM 100 MG PO CAPS
100.0000 mg | ORAL_CAPSULE | Freq: Two times a day (BID) | ORAL | Status: DC
  Administered 2017-06-21 – 2017-06-25 (×8): 100 mg via ORAL
  Filled 2017-06-21 (×8): qty 1

## 2017-06-21 MED ORDER — BUPIVACAINE HCL (PF) 0.75 % IJ SOLN
INTRAMUSCULAR | Status: DC | PRN
  Administered 2017-06-21: 25 mL via PERINEURAL

## 2017-06-21 MED ORDER — LACTATED RINGERS IV SOLN
INTRAVENOUS | Status: DC
  Administered 2017-06-21 (×3): via INTRAVENOUS

## 2017-06-21 MED ORDER — BUPIVACAINE-EPINEPHRINE (PF) 0.5% -1:200000 IJ SOLN
INTRAMUSCULAR | Status: AC
  Filled 2017-06-21: qty 30

## 2017-06-21 MED ORDER — CHLORHEXIDINE GLUCONATE 4 % EX LIQD: 60.0000 mL | Freq: Once | CUTANEOUS | Status: DC

## 2017-06-21 MED ORDER — METFORMIN HCL 500 MG PO TABS
500.0000 mg | ORAL_TABLET | Freq: Two times a day (BID) | ORAL | Status: DC
  Administered 2017-06-21 – 2017-06-25 (×8): 500 mg via ORAL
  Filled 2017-06-21 (×8): qty 1

## 2017-06-21 MED ORDER — ONDANSETRON HCL 4 MG/2ML IJ SOLN
INTRAMUSCULAR | Status: AC
  Filled 2017-06-21: qty 2

## 2017-06-21 MED ORDER — HYDROMORPHONE HCL 1 MG/ML IJ SOLN
0.5000 mg | INTRAMUSCULAR | Status: DC | PRN
  Administered 2017-06-21: 0.5 mg via INTRAVENOUS
  Filled 2017-06-21: qty 0.5

## 2017-06-21 MED ORDER — BISACODYL 5 MG PO TBEC: 5.0000 mg | DELAYED_RELEASE_TABLET | Freq: Every day | ORAL | Status: DC | PRN

## 2017-06-21 MED ORDER — CLINDAMYCIN PHOSPHATE 600 MG/50ML IV SOLN
600.0000 mg | Freq: Four times a day (QID) | INTRAVENOUS | Status: AC
  Administered 2017-06-21 (×2): 600 mg via INTRAVENOUS
  Filled 2017-06-21 (×2): qty 50

## 2017-06-21 MED ORDER — ALUM & MAG HYDROXIDE-SIMETH 200-200-20 MG/5ML PO SUSP: 30.0000 mL | ORAL | Status: DC | PRN

## 2017-06-21 MED ORDER — TRANEXAMIC ACID 1000 MG/10ML IV SOLN
INTRAVENOUS | Status: AC | PRN
  Administered 2017-06-21: 2000 mg via TOPICAL

## 2017-06-21 MED ORDER — METHOCARBAMOL 500 MG PO TABS
500.0000 mg | ORAL_TABLET | Freq: Four times a day (QID) | ORAL | Status: DC | PRN
  Administered 2017-06-21 – 2017-06-25 (×8): 500 mg via ORAL
  Filled 2017-06-21 (×8): qty 1

## 2017-06-21 MED ORDER — ACETAMINOPHEN 160 MG/5ML PO SOLN: 325.0000 mg | ORAL | Status: DC | PRN

## 2017-06-21 MED ORDER — POLYETHYLENE GLYCOL 3350 17 G PO PACK
17.0000 g | PACK | Freq: Every day | ORAL | Status: DC | PRN
  Administered 2017-06-23 – 2017-06-25 (×2): 17 g via ORAL
  Filled 2017-06-21 (×2): qty 1

## 2017-06-21 MED ORDER — LACTATED RINGERS IV SOLN
INTRAVENOUS | Status: DC
  Administered 2017-06-21: 07:00:00 via INTRAVENOUS

## 2017-06-21 MED ORDER — HYDROCHLOROTHIAZIDE 25 MG PO TABS
25.0000 mg | ORAL_TABLET | Freq: Every day | ORAL | Status: DC
  Administered 2017-06-21 – 2017-06-25 (×5): 25 mg via ORAL
  Filled 2017-06-21 (×5): qty 1

## 2017-06-21 MED ORDER — SIMVASTATIN 20 MG PO TABS
40.0000 mg | ORAL_TABLET | Freq: Every day | ORAL | Status: DC
  Administered 2017-06-22 – 2017-06-24 (×3): 40 mg via ORAL
  Filled 2017-06-21 (×3): qty 2

## 2017-06-21 MED ORDER — ACETAMINOPHEN 650 MG RE SUPP: 650.0000 mg | RECTAL | Status: DC | PRN

## 2017-06-21 MED ORDER — PROPOFOL 10 MG/ML IV BOLUS
INTRAVENOUS | Status: AC
  Filled 2017-06-21: qty 60

## 2017-06-21 MED ORDER — LOSARTAN POTASSIUM-HCTZ 100-25 MG PO TABS: 1.0000 | ORAL_TABLET | Freq: Every day | ORAL | Status: DC

## 2017-06-21 MED ORDER — INSULIN ASPART 100 UNIT/ML ~~LOC~~ SOLN
0.0000 [IU] | Freq: Three times a day (TID) | SUBCUTANEOUS | Status: DC
  Administered 2017-06-22 (×2): 3 [IU] via SUBCUTANEOUS
  Administered 2017-06-23 – 2017-06-24 (×2): 2 [IU] via SUBCUTANEOUS
  Administered 2017-06-24: 3 [IU] via SUBCUTANEOUS

## 2017-06-21 SURGICAL SUPPLY — 55 items
BAG ZIPLOCK 12X15 (MISCELLANEOUS) ×2 IMPLANT
BANDAGE ACE 4X5 VEL STRL LF (GAUZE/BANDAGES/DRESSINGS) ×2 IMPLANT
BANDAGE ACE 6X5 VEL STRL LF (GAUZE/BANDAGES/DRESSINGS) ×2 IMPLANT
BENZOIN TINCTURE PRP APPL 2/3 (GAUZE/BANDAGES/DRESSINGS) ×2 IMPLANT
BLADE SAG 18X100X1.27 (BLADE) ×2 IMPLANT
BLADE SAW SGTL 13.0X1.19X90.0M (BLADE) ×2 IMPLANT
BOOTIES KNEE HIGH SLOAN (MISCELLANEOUS) ×2 IMPLANT
BOWL SMART MIX CTS (DISPOSABLE) ×2 IMPLANT
CAPT KNEE TOTAL 3 ATTUNE ×2 IMPLANT
CEMENT HV SMART SET (Cement) ×2 IMPLANT
CUFF TOURN SGL QUICK 34 (TOURNIQUET CUFF) ×1
CUFF TRNQT CYL 34X4X40X1 (TOURNIQUET CUFF) ×1 IMPLANT
DRAPE U-SHAPE 47X51 STRL (DRAPES) ×2 IMPLANT
DRSG ADAPTIC 3X8 NADH LF (GAUZE/BANDAGES/DRESSINGS) ×2 IMPLANT
DRSG AQUACEL AG ADV 3.5X10 (GAUZE/BANDAGES/DRESSINGS) ×2 IMPLANT
DRSG MEPILEX BORDER 4X8 (GAUZE/BANDAGES/DRESSINGS) ×2 IMPLANT
DRSG PAD ABDOMINAL 8X10 ST (GAUZE/BANDAGES/DRESSINGS) ×2 IMPLANT
DURAPREP 26ML APPLICATOR (WOUND CARE) ×4 IMPLANT
ELECT REM PT RETURN 15FT ADLT (MISCELLANEOUS) ×2 IMPLANT
GAUZE SPONGE 4X4 12PLY STRL (GAUZE/BANDAGES/DRESSINGS) ×2 IMPLANT
GLOVE BIOGEL PI IND STRL 7.0 (GLOVE) ×4 IMPLANT
GLOVE BIOGEL PI IND STRL 8 (GLOVE) ×2 IMPLANT
GLOVE BIOGEL PI INDICATOR 7.0 (GLOVE) ×4
GLOVE BIOGEL PI INDICATOR 8 (GLOVE) ×2
GLOVE ECLIPSE 7.5 STRL STRAW (GLOVE) ×4 IMPLANT
GOWN STRL REUS W/ TWL LRG LVL3 (GOWN DISPOSABLE) ×2 IMPLANT
GOWN STRL REUS W/TWL LRG LVL3 (GOWN DISPOSABLE) ×2
GOWN STRL REUS W/TWL XL LVL3 (GOWN DISPOSABLE) ×4 IMPLANT
HANDPIECE INTERPULSE COAX TIP (DISPOSABLE) ×1
HOOD PEEL AWAY FLYTE STAYCOOL (MISCELLANEOUS) ×6 IMPLANT
IMMOBILIZER KNEE 20 (SOFTGOODS) ×2
IMMOBILIZER KNEE 20 THIGH 36 (SOFTGOODS) ×1 IMPLANT
MANIFOLD NEPTUNE II (INSTRUMENTS) ×2 IMPLANT
NEEDLE HYPO 21X1.5 SAFETY (NEEDLE) ×4 IMPLANT
NS IRRIG 1000ML POUR BTL (IV SOLUTION) ×2 IMPLANT
PACK ICE MAXI GEL EZY WRAP (MISCELLANEOUS) ×2 IMPLANT
PACK TOTAL KNEE CUSTOM (KITS) ×2 IMPLANT
PADDING CAST COTTON 6X4 STRL (CAST SUPPLIES) ×2 IMPLANT
POSITIONER SURGICAL ARM (MISCELLANEOUS) ×2 IMPLANT
SET HNDPC FAN SPRY TIP SCT (DISPOSABLE) ×1 IMPLANT
STAPLER VISISTAT 35W (STAPLE) IMPLANT
STRIP CLOSURE SKIN 1/2X4 (GAUZE/BANDAGES/DRESSINGS) ×2 IMPLANT
SUT MNCRL AB 3-0 PS2 18 (SUTURE) ×2 IMPLANT
SUT VIC AB 0 CT1 36 (SUTURE) ×2 IMPLANT
SUT VIC AB 1 CT1 36 (SUTURE) ×4 IMPLANT
SUT VIC AB 2-0 CT1 27 (SUTURE) ×1
SUT VIC AB 2-0 CT1 TAPERPNT 27 (SUTURE) ×1 IMPLANT
SWABSTK COMLB BENZOIN TINCTURE (MISCELLANEOUS) ×2 IMPLANT
SYRINGE 10CC LL (SYRINGE) ×4 IMPLANT
TOWEL OR NON WOVEN STRL DISP B (DISPOSABLE) ×2 IMPLANT
TRAY FOLEY BAG SILVER LF 16FR (CATHETERS) ×2 IMPLANT
TRAY FOLEY CATH 14FR (SET/KITS/TRAYS/PACK) ×2 IMPLANT
WATER STERILE IRR 1000ML POUR (IV SOLUTION) ×2 IMPLANT
WRAP KNEE MAXI GEL POST OP (GAUZE/BANDAGES/DRESSINGS) ×2 IMPLANT
YANKAUER SUCT BULB TIP 10FT TU (MISCELLANEOUS) ×2 IMPLANT

## 2017-06-21 NOTE — [place] FL2 (Signed)
[place] MEDICAID FL2 LEVEL OF CARE SCREENING TOOL     IDENTIFICATION  Patient Name: Connie Branch Birthdate: 01/03/1951 Sex: female Admission Date (Current Location): 06/21/2017  [facility] and [place] Number:  Producer, television/film/video and Address:  [facility],  [address] [place] [address]      Provider Number: [id number]  Attending Physician Name and Address:  Jodi Geralds, MD  Relative Name and Phone Number:       Current Level of Care: Hospital Recommended Level of Care: Skilled Nursing Facility Prior Approval Number:    Date Approved/Denied:   PASRR Number: [id number]  Discharge Plan: SNF    Current Diagnoses: Patient Active Problem List   Diagnosis Date Noted  . Primary osteoarthritis of right knee 06/21/2017  . Primary osteoarthritis of left knee 06/21/2017  . DM (diabetes mellitus), secondary, uncontrolled, with ketoacidosis (HCC) 05/23/2013  . Hypokalemia 05/22/2013  . Obesity (BMI 30-39.9) 05/22/2013  . DKA (diabetic ketoacidoses) (HCC) 05/21/2013  . Muscle spasm 05/21/2013  . Hyponatremia 05/21/2013  . ARF (acute renal failure) secondary to dehydration from osmotic diuresis 05/21/2013  . HTN (hypertension) 05/21/2013  . Hyperlipidemia 05/21/2013  . GERD (gastroesophageal reflux disease) 05/21/2013  . Infection due to trichomonas 05/21/2013    Orientation RESPIRATION BLADDER Height & Weight     Self, Time, Situation, Place  Normal Continent Weight: 163 lb 3.2 oz (74 kg) Height:  5\' 4"  (162.6 cm)  BEHAVIORAL SYMPTOMS/MOOD NEUROLOGICAL BOWEL NUTRITION STATUS      Continent Diet(D/C Summary)  AMBULATORY STATUS COMMUNICATION OF NEEDS Skin   Extensive Assist Verbally (Surgical Incision/Left Knee)                       Personal Care Assistance Level of Assistance  Bathing, Feeding, Dressing Bathing Assistance: Limited assistance Feeding assistance: Independent Dressing Assistance: Limited assistance     Functional  Limitations Info  Sight, Hearing, Speech Sight Info: Adequate Hearing Info: Adequate Speech Info: Adequate    SPECIAL CARE FACTORS FREQUENCY  PT (By licensed PT), OT (By licensed OT)     PT Frequency: 5x/week OT Frequency: 5x/week            Contractures Contractures Info: Not present    Additional Factors Info  Code Status, Allergies, Insulin Sliding Scale Code Status Info: Fullcode Allergies Info: Allergies: Erythromycin, Zithromax Azithromycin, Aspirin, Penicillins   Insulin Sliding Scale Info: 3x's a day w/ meals        Current Medications (06/21/2017):  This is the current hospital active medication list Current Facility-Administered Medications  Medication Dose Route Frequency Provider Last Rate Last Dose  . 0.9 %  sodium chloride infusion   Intravenous Continuous Marshia Ly, PA-C 100 mL/hr at 06/21/17 1212    . acetaminophen (TYLENOL) tablet 650 mg  650 mg Oral Q4H PRN Marshia Ly, PA-C       Or  . acetaminophen (TYLENOL) suppository 650 mg  650 mg Rectal Q4H PRN Marshia Ly, PA-C      . alum & mag hydroxide-simeth (MAALOX/MYLANTA) 200-200-20 MG/5ML suspension 30 mL  30 mL Oral Q4H PRN Marshia Ly, PA-C      . [START ON 06/22/2017] apixaban (ELIQUIS) tablet 2.5 mg  2.5 mg Oral Q12H Marshia Ly, PA-C      . bisacodyl (DULCOLAX) EC tablet 5 mg  5 mg Oral Daily PRN Marshia Ly, PA-C      . clindamycin (CLEOCIN) IVPB 600 mg  600 mg Intravenous Q6H Marshia Ly, PA-C 100 mL/hr  at 06/21/17 1258 600 mg at 06/21/17 1258  . dexamethasone (DECADRON) injection 10 mg  10 mg Intravenous Q12H Marshia LyBethune, James, PA-C      . diphenhydrAMINE (BENADRYL) 12.5 MG/5ML elixir 12.5-25 mg  12.5-25 mg Oral Q4H PRN Marshia LyBethune, James, PA-C      . docusate sodium (COLACE) capsule 100 mg  100 mg Oral BID Marshia LyBethune, James, PA-C      . gabapentin (NEURONTIN) capsule 300 mg  300 mg Oral BID Bethune, James, PA-C      . losartan (COZAAR) tablet 100 mg  100 mg Oral Daily Jodi GeraldsGraves, John, MD    100 mg at 06/21/17 1209   And  . hydrochlorothiazide (HYDRODIURIL) tablet 25 mg  25 mg Oral Daily Jodi GeraldsGraves, John, MD   25 mg at 06/21/17 1209  . HYDROmorphone (DILAUDID) injection 0.5 mg  0.5 mg Intravenous Q2H PRN Marshia LyBethune, James, PA-C      . insulin aspart (novoLOG) injection 0-15 Units  0-15 Units Subcutaneous TID WC Marshia LyBethune, James, PA-C      . magnesium citrate solution 1 Bottle  1 Bottle Oral Once PRN Marshia LyBethune, James, PA-C      . metFORMIN (GLUCOPHAGE) tablet 500 mg  500 mg Oral BID WC Marshia LyBethune, James, PA-C      . methocarbamol (ROBAXIN) tablet 500 mg  500 mg Oral Q6H PRN Marshia LyBethune, James, PA-C       Or  . methocarbamol (ROBAXIN) 500 mg in dextrose 5 % 50 mL IVPB  500 mg Intravenous Q6H PRN Marshia LyBethune, James, PA-C   Stopped at 06/21/17 1020  . ondansetron (ZOFRAN) tablet 4 mg  4 mg Oral Q6H PRN Marshia LyBethune, James, PA-C       Or  . ondansetron [facility](ZOFRAN) injection 4 mg  4 mg Intravenous Q6H PRN Marshia LyBethune, James, PA-C      . oxyCODONE (Oxy IR/ROXICODONE) immediate release tablet 5-10 mg  5-10 mg Oral Q4H PRN Marshia LyBethune, James, PA-C      . [START ON 06/22/2017] pantoprazole (PROTONIX) EC tablet 80 mg  80 mg Oral Daily Marshia LyBethune, James, PA-C      . polyethylene glycol (MIRALAX / GLYCOLAX) packet 17 g  17 g Oral Daily PRN Marshia LyBethune, James, PA-C      . [START ON 06/22/2017] simvastatin (ZOCOR) tablet 40 mg  40 mg Oral q1800 Marshia LyBethune, James, PA-C         Discharge Medications: Please see discharge summary for a list of discharge medications.  Relevant Imaging Results:  Relevant Lab Results:   Additional Information ssn:[phone]  Clearance CootsNicole A Xzavier Swinger, LCSW

## 2017-06-21 NOTE — Discharge Instructions (Signed)
INSTRUCTIONS AFTER JOINT REPLACEMENT  ° °o Remove items at home which could result in a fall. This includes throw rugs or furniture in walking pathways °o ICE to the affected joint every three hours while awake for 30 minutes at a time, for at least the first 3-5 days, and then as needed for pain and swelling.  Continue to use ice for pain and swelling. You may notice swelling that will progress down to the foot and ankle.  This is normal after surgery.  Elevate your leg when you are not up walking on it.   °o Continue to use the breathing machine you got in the hospital (incentive spirometer) which will help keep your temperature down.  It is common for your temperature to cycle up and down following surgery, especially at night when you are not up moving around and exerting yourself.  The breathing machine keeps your lungs expanded and your temperature down. ° ° °DIET:  As you were doing prior to hospitalization, we recommend a well-balanced diet. ° °DRESSING / WOUND CARE / SHOWERING ° °Keep the surgical dressing until follow up.  The dressing is water proof, so you can shower without any extra covering.  IF THE DRESSING FALLS OFF or the wound gets wet inside, change the dressing with sterile gauze.  Please use good hand washing techniques before changing the dressing.  Do not use any lotions or creams on the incision until instructed by your surgeon.   ° °ACTIVITY ° °o Increase activity slowly as tolerated, but follow the weight bearing instructions below.   °o No driving for 6 weeks or until further direction given by your physician.  You cannot drive while taking narcotics.  °o No lifting or carrying greater than 10 lbs. until further directed by your surgeon. °o Avoid periods of inactivity such as sitting longer than an hour when not asleep. This helps prevent blood clots.  °o You may return to work once you are authorized by your doctor.  ° ° ° °WEIGHT BEARING  ° °Weight bearing as tolerated with assist  device (walker, cane, etc) as directed, use it as long as suggested by your surgeon or therapist, typically at least 4-6 weeks. ° ° °EXERCISES ° °Results after joint replacement surgery are often greatly improved when you follow the exercise, range of motion and muscle strengthening exercises prescribed by your doctor. Safety measures are also important to protect the joint from further injury. Any time any of these exercises cause you to have increased pain or swelling, decrease what you are doing until you are comfortable again and then slowly increase them. If you have problems or questions, call your caregiver or physical therapist for advice.  ° °Rehabilitation is important following a joint replacement. After just a few days of immobilization, the muscles of the leg can become weakened and shrink (atrophy).  These exercises are designed to build up the tone and strength of the thigh and leg muscles and to improve motion. Often times heat used for twenty to thirty minutes before working out will loosen up your tissues and help with improving the range of motion but do not use heat for the first two weeks following surgery (sometimes heat can increase post-operative swelling).  ° °These exercises can be done on a training (exercise) mat, on the floor, on a table or on a bed. Use whatever works the best and is most comfortable for you.    Use music or television while you are exercising so that   the exercises are a pleasant break in your day. This will make your life better with the exercises acting as a break in your routine that you can look forward to.   Perform all exercises about fifteen times, three times per day or as directed.  You should exercise both the operative leg and the other leg as well. ° °Exercises include: °  °• Quad Sets - Tighten up the muscle on the front of the thigh (Quad) and hold for 5-10 seconds.   °• Straight Leg Raises - With your knee straight (if you were given a brace, keep it on),  lift the leg to 60 degrees, hold for 3 seconds, and slowly lower the leg.  Perform this exercise against resistance later as your leg gets stronger.  °• Leg Slides: Lying on your back, slowly slide your foot toward your buttocks, bending your knee up off the floor (only go as far as is comfortable). Then slowly slide your foot back down until your leg is flat on the floor again.  °• Angel Wings: Lying on your back spread your legs to the side as far apart as you can without causing discomfort.  °• Hamstring Strength:  Lying on your back, push your heel against the floor with your leg straight by tightening up the muscles of your buttocks.  Repeat, but this time bend your knee to a comfortable angle, and push your heel against the floor.  You may put a pillow under the heel to make it more comfortable if necessary.  ° °A rehabilitation program following joint replacement surgery can speed recovery and prevent re-injury in the future due to weakened muscles. Contact your doctor or a physical therapist for more information on knee rehabilitation.  ° ° °CONSTIPATION ° °Constipation is defined medically as fewer than three stools per week and severe constipation as less than one stool per week.  Even if you have a regular bowel pattern at home, your normal regimen is likely to be disrupted due to multiple reasons following surgery.  Combination of anesthesia, postoperative narcotics, change in appetite and fluid intake all can affect your bowels.  ° °YOU MUST use at least one of the following options; they are listed in order of increasing strength to get the job done.  They are all available over the counter, and you may need to use some, POSSIBLY even all of these options:   ° °Drink plenty of fluids (prune juice may be helpful) and high fiber foods °Colace 100 mg by mouth twice a day  °Senokot for constipation as directed and as needed Dulcolax (bisacodyl), take with full glass of water  °Miralax (polyethylene glycol)  once or twice a day as needed. ° °If you have tried all these things and are unable to have a bowel movement in the first 3-4 days after surgery call either your surgeon or your primary doctor.   ° °If you experience loose stools or diarrhea, hold the medications until you stool forms back up.  If your symptoms do not get better within 1 week or if they get worse, check with your doctor.  If you experience "the worst abdominal pain ever" or develop nausea or vomiting, please contact the office immediately for further recommendations for treatment. ° ° °ITCHING:  If you experience itching with your medications, try taking only a single pain pill, or even half a pain pill at a time.  You can also use Benadryl over the counter for itching or also to   help with sleep.   TED HOSE STOCKINGS:  Use stockings on both legs until for at least 2 weeks or as directed by physician office. They may be removed at night for sleeping.  MEDICATIONS:  See your medication summary on the After Visit Summary that nursing will review with you.  You may have some home medications which will be placed on hold until you complete the course of blood thinner medication.  It is important for you to complete the blood thinner medication as prescribed.  Information on my medicine - ELIQUIS (apixaban)  Why was Eliquis prescribed for you? Eliquis was prescribed for you to reduce the risk of blood clots forming after orthopedic surgery.    What do You need to know about Eliquis? Take your Eliquis TWICE DAILY - one tablet in the morning and one tablet in the evening with or without food.  It would be best to take the dose about the same time each day.  If you have difficulty swallowing the tablet whole please discuss with your pharmacist how to take the medication safely.  Take Eliquis exactly as prescribed by your doctor and DO NOT stop taking Eliquis without talking to the doctor who prescribed the medication.  Stopping  without other medication to take the place of Eliquis may increase your risk of developing a clot.  After discharge, you should have regular check-up appointments with your healthcare provider that is prescribing your Eliquis.  What do you do if you miss a dose? If a dose of ELIQUIS is not taken at the scheduled time, take it as soon as possible on the same day and twice-daily administration should be resumed.  The dose should not be doubled to make up for a missed dose.  Do not take more than one tablet of ELIQUIS at the same time.  Important Safety Information A possible side effect of Eliquis is bleeding. You should call your healthcare provider right away if you experience any of the following: ? Bleeding from an injury or your nose that does not stop. ? Unusual colored urine (red or dark brown) or unusual colored stools (red or black). ? Unusual bruising for unknown reasons. ? A serious fall or if you hit your head (even if there is no bleeding).  Some medicines may interact with Eliquis and might increase your risk of bleeding or clotting while on Eliquis. To help avoid this, consult your healthcare provider or pharmacist prior to using any new prescription or non-prescription medications, including herbals, vitamins, non-steroidal anti-inflammatory drugs (NSAIDs) and supplements.  This website has more information on Eliquis (apixaban): http://www.eliquis.com/eliquis/home   PRECAUTIONS:  If you experience chest pain or shortness of breath - call 911 immediately for transfer to the hospital emergency department.   If you develop a fever greater that 101 F, purulent drainage from wound, increased redness or drainage from wound, foul odor from the wound/dressing, or calf pain - CONTACT YOUR SURGEON.                                                   FOLLOW-UP APPOINTMENTS:  If you do not already have a post-op appointment, please call the office for an appointment to be seen by your  surgeon.  Guidelines for how soon to be seen are listed in your After Visit Summary, but are typically between 1-4  weeks after surgery.  OTHER INSTRUCTIONS:   Knee Replacement:  Do not place pillow under knee, focus on keeping the knee straight while resting. CPM instructions: 0-90 degrees, 2 hours in the morning, 2 hours in the afternoon, and 2 hours in the evening. Place foam block, curve side up under heel at all times except when in CPM or when walking.  DO NOT modify, tear, cut, or change the foam block in any way.  MAKE SURE YOU:   Understand these instructions.   Get help right away if you are not doing well or get worse.    Thank you for letting us be a part of your medical care team.  It is a privilege we respect greatly.  We hope these instructions will help you stay on track for a fast and full recovery!

## 2017-06-21 NOTE — Transfer of Care (Signed)
Immediate Anesthesia Transfer of Care Note  Patient: Connie Branch  Procedure(s) Performed: RIGHT TOTAL KNEE ARTHROPLASTY (Right Knee)  Patient Location: PACU  Anesthesia Type:Spinal  Level of Consciousness: awake, alert , oriented and patient cooperative  Airway & Oxygen Therapy: Patient Spontanous Breathing and Patient connected to face mask oxygen  Post-op Assessment: Report given to RN and Post -op Vital signs reviewed and stable  Post vital signs: stable  Last Vitals:  Vitals:   06/21/17 0602 06/21/17 0910  BP: (!) 166/84   Pulse: 81 98  Resp: 16 13  Temp: 36.9 C   SpO2: 98% 99%    Last Pain:  Vitals:   06/21/17 0602  TempSrc: Oral         Complications: No apparent anesthesia complications

## 2017-06-21 NOTE — Anesthesia Procedure Notes (Signed)
Procedure Name: MAC Date/Time: 06/21/2017 7:22 AM Performed by: Lissa Morales, CRNA Pre-anesthesia Checklist: Patient identified, Emergency Drugs available, Suction available, Patient being monitored and Timeout performed Patient Re-evaluated:Patient Re-evaluated prior to induction Oxygen Delivery Method: Simple face mask Placement Confirmation: positive ETCO2

## 2017-06-21 NOTE — Evaluation (Signed)
Physical Therapy Evaluation Patient Details Name: Connie Branch MRN: [id number] DOB: May 05, 1951 Today's Date: 06/21/2017   History of Present Illness  Pt s/p R TKR  and with hx of DDD  Clinical Impression  Pt s/p R TKR and presents with decreased R LE strength/ROM and post op pain limiting functional mobility.  Pt would benefit from follow up rehab at SNF level to maximize IND and safety prior to return home ALONE.    Follow Up Recommendations SNF    Equipment Recommendations  None recommended by PT    Recommendations for Other Services       Precautions / Restrictions Precautions Precautions: Knee;Fall Required Braces or Orthoses: Knee Immobilizer - Right Knee Immobilizer - Right: Discontinue once straight leg raise with < 10 degree lag Restrictions Weight Bearing Restrictions: No Other Position/Activity Restrictions: WBAT      Mobility  Bed Mobility Overal bed mobility: Needs Assistance Bed Mobility: Supine to Sit     Supine to sit: Min assist     General bed mobility comments: increased time with cues for sequence and use of L LE ot self assist  Transfers Overall transfer level: Needs assistance Equipment used: Rolling walker (2 wheeled) Transfers: Sit to/from Stand Sit to Stand: Min assist;Mod assist         General transfer comment: cues for LE managment and use of UEs to self assist  Ambulation/Gait Ambulation/Gait assistance: Min assist Ambulation Distance (Feet): 28 Feet Assistive device: Rolling walker (2 wheeled) Gait Pattern/deviations: Step-to pattern;Decreased step length - right;Decreased step length - left;Shuffle;Trunk flexed Gait velocity: decr Gait velocity interpretation: Below normal speed for age/gender General Gait Details: cues for sequence, posture and position from [employer]  Stairs            Wheelchair Mobility    Modified Rankin (Stroke Patients Only)       Balance                                             Pertinent Vitals/Pain Pain Assessment: 0-10 Pain Score: 6  Pain Location: R knee Pain Descriptors / Indicators: Aching;Sore;Numbness Pain Intervention(s): Limited activity within patient's tolerance;Monitored during session;Premedicated before session;Ice applied    Home Living Family/patient expects to be discharged to:: Skilled nursing facility Living Arrangements: Alone                    Prior Function Level of Independence: Independent               Hand Dominance   Dominant Hand: Right    Extremity/Trunk Assessment   Upper Extremity Assessment Upper Extremity Assessment: Overall WFL for tasks assessed    Lower Extremity Assessment Lower Extremity Assessment: RLE deficits/detail    Cervical / Trunk Assessment Cervical / Trunk Assessment: Normal  Communication   Communication: No difficulties  Cognition Arousal/Alertness: Awake/alert Behavior During Therapy: WFL for tasks assessed/performed Overall Cognitive Status: Within Functional Limits for tasks assessed                                        General Comments      Exercises Total Joint Exercises Ankle Circles/Pumps: AROM;Both;15 reps;Supine   Assessment/Plan    PT Assessment Patient needs continued PT services  PT Problem List Decreased strength;Decreased range of motion;Decreased  activity tolerance;Decreased mobility;Decreased knowledge of use of DME;Pain       PT Treatment Interventions DME instruction;Gait training;Therapeutic activities;Functional mobility training;Therapeutic exercise;Patient/family education    PT Goals (Current goals can be found in the Care Plan section)  Acute Rehab PT Goals Patient Stated Goal: Rehab and home PT Goal Formulation: With patient Time For Goal Achievement: 06/28/17 Potential to Achieve Goals: Good    Frequency 7X/week   Barriers to discharge        Co-evaluation               AM-PAC PT "6 Clicks" Daily  Activity  Outcome Measure Difficulty turning over in bed (including adjusting bedclothes, sheets and blankets)?: Unable Difficulty moving from lying on back to sitting on the side of the bed? : Unable Difficulty sitting down on and standing up from a chair with arms (e.g., wheelchair, bedside commode, etc,.)?: Unable Help needed moving to and from a bed to chair (including a wheelchair)?: A Little Help needed walking in hospital room?: A Little Help needed climbing 3-5 steps with a railing? : A Lot 6 Click Score: 11    End of Session Equipment Utilized During Treatment: Gait belt;Right knee immobilizer Activity Tolerance: Patient tolerated treatment well Patient left: in chair;with call bell/phone within reach;with family/visitor present Nurse Communication: Mobility status PT Visit Diagnosis: Difficulty in walking, not elsewhere classified (R26.2)    Time: 4098-1191 PT Time Calculation (min) (ACUTE ONLY): 23 min   Charges:   PT Evaluation $PT Eval Low Complexity: 1 Low PT Treatments $Gait Training: 8-22 mins   PT G Codes:        Pg [phone]   Yaniv Lage 06/21/2017, 2:56 PM

## 2017-06-21 NOTE — Op Note (Signed)
NAMEGIANINA, OLINDE NO.:  [id number]  MEDICAL RECORD NO.:  [id number]  LOCATION:                                 FACILITY:  PHYSICIAN:  Harvie Junior, M.D.        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  PROCEDURE:  Right total knee replacement with an Attune system size 4 narrow femur, size 3 tibia, 7 mm bridging bearing, and a 32 mm all polyethylene patella.  SURGEON:  Harvie Junior, M.D.  Threasa HeadsOrma Flaming.  ANESTHESIA:  General.  BRIEF HISTORY:  Ms. Melamed is a 67 year old female with a long history and significant complaints of right knee pain.  She had been treated conservatively for prolonged period of time and after failure of all conservative care, she was taken to the operating room for right total knee replacement.  The patient was having night pain and light activity pain.  The patient had x-ray showing bone-on-bone change.  DESCRIPTION OF PROCEDURE:  The patient was taken to the operating room. After adequate anesthesia was obtained with spinal anesthetic, the patient was placed supine on the operating table.  The right leg was prepped and draped in usual sterile fashion.  Following this, the leg was exsanguinated.  Blood pressure tourniquet was inflated to 300 mmHg. Following this, a midline incision was made and subcutaneous tissue dissected down to the level of the extensor mechanism and a medial parapatellar arthrotomy was undertaken.  Once this was completed, attention was turned towards the medial and lateral meniscus, which were removed.  Retropatellar fat pad was removed and synovium on the anterior aspect of the femur was removed and the medial and  lateral meniscus and the anterior and posterior cruciates.  Once this was done, attention was turned to the femur where an intramedullary pilot hole was drilled, and a 4-degree valgus inclination cut was made with 9 mm of distal bone resected.  Once that was resected, attention was turned towards sizing the femur, it sized to a 4.  Anterior and posterior cuts were made, chamfers and box.  Attention was then turned to the tibia.  It is cut perpendicular to its long axis.  It sized to a 3 and it is drilled and keeled and trials were put in place.  Excellent range of motion and stability were achieved.  A 6 seems a little loose.  We are going to trial a 7 with the cement.  At this point, we went to the patella, cut it down freehand because it was so narrow to a level of 13 mm and once this was done, a 32 paddle was chosen and lugs were drilled here. Attention was then turned towards putting the knee through a range of motion.  Excellent range of motion and stability were achieved.  Perfect gap balance.  At this point, Exparel was placed in the back of the knee. The trial components were removed.  The knee was copiously and  thoroughly lavaged, pulsatile lavage, irrigation and suctioned dry. Then, the final components were cemented into place size 3 tibia, size 4 narrow femur, a 7 mm bridging bearing trial was placed, and a 32 all polyethylene patella was placed and held with a clamp.  All excess bone and cement were removed.  Cement was allowed to completely harden.  Once hardened, the knee was again examined, tourniquet was let down.  Range of motion and stability were checked and satisfied with all of this at this point, the 7 was opened and placed and knee put through a range of motion.  Excellent stability and range of motion were achieved.  At this point, the knee was closed with 1 Vicryl running, the skin was closed with 0 and 2-0 Vicryl, and 3-0 Monocryl subcuticular.  Benzoin and  Steri- Strips were applied.  Sterile compressive dressing was applied.  The patient was taken to recovery room and noted be in satisfactory condition.  Estimated blood loss for procedure is minimal.     Harvie JuniorJohn L. Theordore Cisnero, M.D.   ______________________________ Harvie JuniorJohn L. Marketia Stallsmith, M.D.    Ranae PlumberJLG/MEDQ  D:  06/21/2017  T:  06/21/2017  Job:  [id number]  cc:   Harvie JuniorJohn L. Datra Clary, M.D. Fax: [phone]

## 2017-06-21 NOTE — Anesthesia Procedure Notes (Signed)
Spinal  Patient location during procedure: OR Start time: 06/21/2017 7:27 AM End time: 06/21/2017 7:32 AM Staffing Anesthesiologist: Bethena Midgetddono, Fate Galanti, MD Preanesthetic Checklist Completed: patient identified, site marked, surgical consent, pre-op evaluation, timeout performed, IV checked, risks and benefits discussed and monitors and equipment checked Spinal Block Patient position: sitting Prep: DuraPrep Patient monitoring: heart rate, cardiac monitor, continuous pulse ox and blood pressure Approach: midline Location: L3-4 Injection technique: single-shot Needle Needle type: Sprotte  Needle gauge: 24 G Needle length: 9 cm Assessment Sensory level: T4

## 2017-06-21 NOTE — Brief Op Note (Signed)
06/21/2017  8:50 AM  PATIENT:  Connie Branch  67 y.o. female  PRE-OPERATIVE DIAGNOSIS:  RIGHT KNEE DEGENERATIVE JOINT DISEASE  POST-OPERATIVE DIAGNOSIS:  RIGHT KNEE DEGENERATIVE JOINT DISEASE  PROCEDURE:  Procedure(s): RIGHT TOTAL KNEE ARTHROPLASTY (Right)  SURGEON:  Surgeon(s) and Role:    Jodi Geralds* Yehudit Fulginiti, MD - Primary  PHYSICIAN ASSISTANT:   ASSISTANTS: bethune   ANESTHESIA:   spinal  EBL:  50 mL   BLOOD ADMINISTERED:none  DRAINS: none   LOCAL MEDICATIONS USED:  MARCAINE    and OTHER experel  SPECIMEN:  No Specimen  DISPOSITION OF SPECIMEN:  N/A  COUNTS:  YES  TOURNIQUET:   Total Tourniquet Time Documented: Thigh (Right) - 44 minutes Total: Thigh (Right) - 44 minutes   DICTATION: .Other Dictation: Dictation Number [id number]  PLAN OF CARE: Admit to inpatient   PATIENT DISPOSITION:  PACU - hemodynamically stable.   Delay start of Pharmacological VTE agent (>24hrs) due to surgical blood loss or risk of bleeding: no

## 2017-06-21 NOTE — Anesthesia Procedure Notes (Signed)
Anesthesia Regional Block: Narrative:       

## 2017-06-21 NOTE — Plan of Care (Signed)
  Progressing Education: Knowledge of General Education information will improve 06/21/2017 1158 - Progressing by Cordie Griceodriguez, Rukia Mcgillivray A, RN Health Behavior/Discharge Planning: Ability to manage health-related needs will improve 06/21/2017 1158 - Progressing by Cordie Griceodriguez, Nawaal Alling A, RN Clinical Measurements: Ability to maintain clinical measurements within normal limits will improve 06/21/2017 1158 - Progressing by Cordie Griceodriguez, Azari Janssens A, RN Will remain free from infection 06/21/2017 1158 - Progressing by Cordie Griceodriguez, Zayed Griffie A, RN Diagnostic test results will improve 06/21/2017 1158 - Progressing by Cordie Griceodriguez, Lora Chavers A, RN Respiratory complications will improve 06/21/2017 1158 - Progressing by Cordie Griceodriguez, Melisssa Donner A, RN Cardiovascular complication will be avoided 06/21/2017 1158 - Progressing by Cordie Griceodriguez, Corwyn Vora A, RN Activity: Risk for activity intolerance will decrease 06/21/2017 1158 - Progressing by Cordie Griceodriguez, Nicki Furlan A, RN Nutrition: Adequate nutrition will be maintained 06/21/2017 1158 - Progressing by Cordie Griceodriguez, Nyazia Canevari A, RN Coping: Level of anxiety will decrease 06/21/2017 1158 - Progressing by Cordie Griceodriguez, Ersa Delaney A, RN Elimination: Will not experience complications related to bowel motility 06/21/2017 1158 - Progressing by Cordie Griceodriguez, Earlisha Sharples A, RN Will not experience complications related to urinary retention 06/21/2017 1158 - Progressing by Cordie Griceodriguez, Laquesha Holcomb A, RN Pain Managment: General experience of comfort will improve 06/21/2017 1158 - Progressing by Cordie Griceodriguez, Mekaila Tarnow A, RN Safety: Ability to remain free from injury will improve 06/21/2017 1158 - Progressing by Cordie Griceodriguez, Courney Garrod A, RN Skin Integrity: Risk for impaired skin integrity will decrease 06/21/2017 1158 - Progressing by Cordie Griceodriguez, Rutger Salton A, RN Education: Knowledge of the prescribed therapeutic regimen will improve 06/21/2017 1158 - Progressing by Cordie Griceodriguez, Lequan Dobratz A, RN Activity: Ability to avoid complications of mobility impairment will  improve 06/21/2017 1158 - Progressing by Cordie Griceodriguez, Gianelle Mccaul A, RN Range of joint motion will improve 06/21/2017 1158 - Progressing by Cordie Griceodriguez, Camilla Skeen A, RN Clinical Measurements: Postoperative complications will be avoided or minimized 06/21/2017 1158 - Progressing by Cordie Griceodriguez, Ashna Dorough A, RN Pain Management: Pain level will decrease with appropriate interventions 06/21/2017 1158 - Progressing by Cordie Griceodriguez, Eily Louvier A, RN Skin Integrity: Signs of wound healing will improve 06/21/2017 1158 - Progressing by Cordie Griceodriguez, Shivaan Tierno A, RN

## 2017-06-21 NOTE — Anesthesia Postprocedure Evaluation (Signed)
Anesthesia Post Note  Patient: Connie Branch  Procedure(s) Performed: RIGHT TOTAL KNEE ARTHROPLASTY (Right Knee)     Patient location during evaluation: PACU Anesthesia Type: Regional Level of consciousness: oriented and awake and alert Pain management: pain level controlled Vital Signs Assessment: post-procedure vital signs reviewed and stable Respiratory status: spontaneous breathing, respiratory function stable and patient connected to nasal cannula oxygen Cardiovascular status: blood pressure returned to baseline and stable Postop Assessment: no headache, no backache and no apparent nausea or vomiting Anesthetic complications: no    Last Vitals:  Vitals:   06/21/17 1000 06/21/17 1023  BP: (!) 142/82 (!) 142/75  Pulse: 79 82  Resp: 14 16  Temp: 36.6 C 36.7 C  SpO2: 100% 100%    Last Pain:  Vitals:   06/21/17 1023  TempSrc:   PainSc: 3                  Riyad Keena

## 2017-06-21 NOTE — Anesthesia Procedure Notes (Signed)
Anesthesia Regional Block: Adductor canal block   Pre-Anesthetic Checklist: ,, timeout performed, Correct Patient, Correct Site, Correct Laterality, Correct Procedure, Correct Position, site marked, Risks and benefits discussed,  Surgical consent,  Pre-op evaluation,  At surgeon's request and post-op pain management  Laterality: Right  Prep: chloraprep       Needles:  Injection technique: Single-shot  Needle Type: Echogenic Stimulator Needle     Needle Length: 5cm  Needle Gauge: 22     Additional Needles:   Procedures:, nerve stimulator,,, ultrasound used (permanent image in chart),,,,  Narrative:  Start time: 06/21/2017 7:15 AM End time: 06/21/2017 7:20 AM Injection made incrementally with aspirations every 5 mL.  Performed by: Personally  Anesthesiologist: Bethena Midgetddono, Randell Detter, MD  Additional Notes: Functioning IV was confirmed and monitors were applied.  A 50mm 22ga Arrow echogenic stimulator needle was used. Sterile prep and drape,hand hygiene and sterile gloves were used. Ultrasound guidance: relevant anatomy identified, needle position confirmed, local anesthetic spread visualized around nerve(s)., vascular puncture avoided.  Image printed for medical record. Negative aspiration and negative test dose prior to incremental administration of local anesthetic. The patient tolerated the procedure well.

## 2017-06-21 NOTE — Clinical Social Work Note (Signed)
Clinical Social Work Assessment  Patient Details  Name: Connie Branch MRN: [id number] Date of Birth: Apr 05, 1951  Date of referral:  06/21/17               Reason for consult:  Facility Placement                Permission sought to share information with:  Facility Art therapist granted to share information::  Yes, Verbal Permission Granted  Name::        Agency::  SNF  Relationship::     Contact Information:     Housing/Transportation Living arrangements for the past 2 months:  [place] of Information:  Patient Patient Interpreter Needed:  None Criminal Activity/Legal Involvement Pertinent to Current Situation/Hospitalization:  No - Comment as needed Significant Relationships:  Adult Children Lives with:  Self Do you feel safe going back to the place where you live?  Yes Need for family participation in patient care:  No (Coment)  Care giving concerns:   Patient admitted for scheduled right total knee arthroplasty.  Patient reports having severe pain in left knee and using a cane for assistance to ambulate.  -able to complete her own ADL's. "I am very independent." Patient will follow up with SNF at discharge.   Social Worker assessment / plan:  CSW met with patient at bedside, explained role and reason for visit- to assist with discharge planning. Patient states she is interested in [facility] SNF. She reports she has out of Network benefits that will cover her rehab stay at an out network facility, in this care [place]. She reports talking with Connie Branch at [facility] prior to admission to arrange her stay. She is unsure if Connie Branch will be able to extend an offer.  CSW followed up with Connie Branch, left message waiting on a return call. CSW explain the SNF process and following up with other bed offers if [facility] is unavailable.  CSW completed FL2 sent clinical information via HUB.    Employment status:  Retired Designer, industrial/product PT Recommendations:  [place] / Referral to community resources:  [place]  Patient/Family's Response to care:  No family at bedside. Patient has friend and nephew that plan to visit.  Agreeable to care is hopeful to continue rehab at [facility] of choice.   Patient/Family's Understanding of and Emotional Response to Diagnosis, Current Treatment, and Prognosis:  "I want to go home and be able to walk up my stairs to my bedroom." Patient is knowledgeable of her surgery and post follow up care. Patient was proactive in finding a SNF prior to surgery.   Emotional Assessment Appearance:  Appears stated age Attitude/Demeanor/Rapport:    Affect (typically observed):  Accepting, Pleasant Orientation:  Oriented to Self, Oriented to Place, Oriented to  Time, Oriented to Situation Alcohol / Substance use:  Not Applicable Psych involvement (Current and /or in the community):  No (Comment)  Discharge Needs  Concerns to be addressed:  Discharge Planning Concerns Readmission within the last 30 days:  No Current discharge risk:  Dependent with Mobility, Lack of support system Barriers to Discharge:  Continued Medical Work up, [place], LCSW 06/21/2017, 1:04 PM

## 2017-06-22 LAB — GLUCOSE, CAPILLARY
GLUCOSE-CAPILLARY: 152 mg/dL — AB (ref 65–99)
Glucose-Capillary: 182 mg/dL — ABNORMAL HIGH (ref 65–99)
Glucose-Capillary: 118 mg/dL — ABNORMAL HIGH (ref 65–99)
Glucose-Capillary: 134 mg/dL — ABNORMAL HIGH (ref 65–99)

## 2017-06-22 LAB — CBC
HEMATOCRIT: 28.8 % — AB (ref 36.0–46.0)
Hemoglobin: 9.5 g/dL — ABNORMAL LOW (ref 12.0–15.0)
MCH: 29.6 pg (ref 26.0–34.0)
MCHC: 33 g/dL (ref 30.0–36.0)
MCV: 89.7 fL (ref 78.0–100.0)
Platelets: 122 10*3/uL — ABNORMAL LOW (ref 150–400)
RBC: 3.21 MIL/uL — ABNORMAL LOW (ref 3.87–5.11)
RDW: 14.3 % (ref 11.5–15.5)
WBC: 14.6 10*3/uL — ABNORMAL HIGH (ref 4.0–10.5)

## 2017-06-22 LAB — BASIC METABOLIC PANEL
Anion gap: 11 (ref 5–15)
BUN: 20 mg/dL (ref 6–20)
CALCIUM: 8.6 mg/dL — AB (ref 8.9–10.3)
CO2: 25 mmol/L (ref 22–32)
Chloride: 103 mmol/L (ref 101–111)
Creatinine, Ser: 0.77 mg/dL (ref 0.44–1.00)
GFR calc Af Amer: >60 mL/min (ref >60)
GFR calc non Af Amer: >60 mL/min (ref >60)
GLUCOSE: 170 mg/dL — AB (ref 65–99)
Potassium: 3.5 mmol/L (ref 3.5–5.1)
Sodium: 139 mmol/L (ref 135–145)

## 2017-06-22 NOTE — Progress Notes (Addendum)
Physical Therapy Treatment Patient Details Name: Connie DialMichelle Swoboda MRN: [id number] DOB: Nov 23, 1950 Today's Date: 06/22/2017    History of Present Illness Pt s/p R TKR  and with hx of DDD    PT Comments    Pt performed gait training with min assist for safety and safe use of device, increased WOB observed during gait training.  Pt remains to require min assistance for functional mobility.  Limited due to pain and weakness.  SNF for short term rehab remains appropriate.  Therapeutic exercises deferred as patient's company arrived and she wished to speak with her visitors. Will f/u in pm.     Follow Up Recommendations  SNF     Equipment Recommendations  None recommended by PT    Recommendations for Other Services       Precautions / Restrictions Precautions Precautions: Knee;Fall Required Braces or Orthoses: Knee Immobilizer - Right Knee Immobilizer - Right: Discontinue once straight leg raise with < 10 degree lag Restrictions Weight Bearing Restrictions: Yes Other Position/Activity Restrictions: WBAT RLE    Mobility  Bed Mobility               General bed mobility comments: Pt received in recliner on arrival.    Transfers Overall transfer level: Needs assistance Equipment used: Rolling walker (2 wheeled) Transfers: Sit to/from Stand Sit to Stand: Min assist         General transfer comment: Cues for LE management and to push from seated surface.  Pt attempts to pull on RW for support.    Ambulation/Gait Ambulation/Gait assistance: Min assist Ambulation Distance (Feet): 80 Feet Assistive device: Rolling walker (2 wheeled) Gait Pattern/deviations: Step-to pattern;Decreased step length - right;Decreased step length - left;Shuffle;Trunk flexed;Step-through pattern Gait velocity: decr Gait velocity interpretation: Below normal speed for age/gender General Gait Details: Cues for progression to step through pattern with emphasis on gait symmetry.  Cues for forward  gaze and upper trunk control.     Stairs            Wheelchair Mobility    Modified Rankin (Stroke Patients Only)       Balance Overall balance assessment: Needs assistance   Sitting balance-Leahy Scale: Good       Standing balance-Leahy Scale: Poor                              Cognition Arousal/Alertness: Awake/alert Behavior During Therapy: WFL for tasks assessed/performed Overall Cognitive Status: Within Functional Limits for tasks assessed                                        Exercises Total Joint Exercises Goniometric ROM: 67 degrees flexion measured in R knee.      General Comments        Pertinent Vitals/Pain Pain Assessment: 0-10 Pain Score: 8  Pain Location: R knee Pain Descriptors / Indicators: Aching;Sore Pain Intervention(s): Monitored during session;Repositioned;Ice applied    Home Living                      Prior Function            PT Goals (current goals can now be found in the care plan section) Acute Rehab PT Goals Patient Stated Goal: Rehab and home Potential to Achieve Goals: Good Progress towards PT goals: Progressing toward goals    Frequency  7X/week      PT Plan Current plan remains appropriate    Co-evaluation              AM-PAC PT "6 Clicks" Daily Activity  Outcome Measure  Difficulty turning over in bed (including adjusting bedclothes, sheets and blankets)?: Unable Difficulty moving from lying on back to sitting on the side of the bed? : Unable Difficulty sitting down on and standing up from a chair with arms (e.g., wheelchair, bedside commode, etc,.)?: Unable Help needed moving to and from a bed to chair (including a wheelchair)?: A Little Help needed walking in hospital room?: A Little Help needed climbing 3-5 steps with a railing? : A Lot 6 Click Score: 11    End of Session Equipment Utilized During Treatment: Gait belt Activity Tolerance: Patient  tolerated treatment well Patient left: in chair;with call bell/phone within reach;with family/visitor present Nurse Communication: Mobility status PT Visit Diagnosis: Difficulty in walking, not elsewhere classified (R26.2)     Time: 4098-1191 PT Time Calculation (min) (ACUTE ONLY): 19 min  Charges:  $Gait Training: 8-22 mins                    G Codes:       Joycelyn Rua, PTA pager [phone]    Florestine Avers 06/22/2017, 1:42 PM

## 2017-06-22 NOTE — Progress Notes (Signed)
  PATIENT ID: Connie Branch  MRN: [id number]  DOB/AGE:  [birth year] / 67 y.o.  1 Day Post-Op Procedure(s) (LRB): RIGHT TOTAL KNEE ARTHROPLASTY (Right)  Subjective: Pain is mild.  No c/o chest pain or SOB.   Tol PO solid with nausea today, not yet up with PT  Foley pulled this am.  Objective: Vital signs in last 24 hours: Temp:  [97.8 F (36.6 C)-98.7 F (37.1 C)] 98.7 F (37.1 C) (02/16 0601) Pulse Rate:  [64-94] 85 (02/16 0601) Resp:  [14-16] 16 (02/16 0601) BP: (125-168)/(71-85) 132/78 (02/16 0601) SpO2:  [98 %-100 %] 100 % (02/16 0601)  Intake/Output from previous day: 02/15 0701 - 02/16 0700 In: 5123.3 [P.O.:1140; I.V.:3933.3; IV Piggyback:50] Out: 2875 [Urine:2825; Blood:50] Intake/Output this shift: Total I/O In: 240 [P.O.:240] Out: 500 [Urine:500]  Recent Labs    06/21/17 0650 06/22/17 0510  HGB 11.9* 9.5*   Recent Labs    06/21/17 0650 06/22/17 0510  WBC 6.6 14.6*  RBC 3.93 3.21*  HCT 35.6* 28.8*  PLT 141* 122*   Recent Labs    06/22/17 0510  NA 139  K 3.5  CL 103  CO2 25  BUN 20  CREATININE 0.77  GLUCOSE 170*  CALCIUM 8.6*   Recent Labs    06/21/17 0650  INR 0.87    Physical Exam: ABD soft Sensation intact distally Intact pulses distally Dorsiflexion/Plantar flexion intact Incision: dressing C/D/I Compartment soft  Assessment/Plan: 1 Day Post-Op Procedure(s) (LRB): RIGHT TOTAL KNEE ARTHROPLASTY (Right)   Up with therapy Discharge to SNF Weight Bearing as Tolerated (WBAT)  VTE prophylaxis: pharmacologic prophylaxis (with any of the following: Eliquis)   Cliffton Astersavid A. Janee Mornhompson, MD      [facility]opaedic & Hand Surgery [facility] [address] [place], [place]  [id number] Office: [phone] Mobile: [phone]  06/22/2017, 10:09 AM

## 2017-06-22 NOTE — Plan of Care (Signed)
Patient progressing well

## 2017-06-22 NOTE — Progress Notes (Signed)
Physical Therapy Treatment Patient Details Name: Connie Branch MRN: [id number] DOB: Jun 20, 1950 Today's Date: 06/22/2017    History of Present Illness Pt s/p R TKR  and with hx of DDD    PT Comments    Pt performed increased gait, remains to require min assist for functional mobility.  Pt's ROM is improving and post gait training she was able to tolerated 70 degrees flexion on CPM.  Plan for short term SNF remains appropriate as she will take a slower pace to recover.  Plan for treatment tomorrow with emphasis on therapeutic exercises to improve strength and ROM.    Follow Up Recommendations  SNF     Equipment Recommendations  None recommended by PT    Recommendations for Other Services       Precautions / Restrictions Precautions Precautions: Knee;Fall Required Braces or Orthoses: Knee Immobilizer - Right Knee Immobilizer - Right: Discontinue once straight leg raise with < 10 degree lag Restrictions Weight Bearing Restrictions: Yes Other Position/Activity Restrictions: WBAT RLE    Mobility  Bed Mobility Overal bed mobility: Needs Assistance Bed Mobility: Sit to Supine       Sit to supine: Min guard   General bed mobility comments: Cues for technique and problem solving to return to supine.  Once in supine CPM placed per patient request.    Transfers Overall transfer level: Needs assistance Equipment used: Rolling walker (2 wheeled) Transfers: Sit to/from Stand Sit to Stand: Min assist         General transfer comment: Cues for LE management and to push from seated surface.  Better carryover with hand placement.    Ambulation/Gait Ambulation/Gait assistance: Min assist Ambulation Distance (Feet): 100 Feet Assistive device: Rolling walker (2 wheeled) Gait Pattern/deviations: Decreased step length - right;Decreased step length - left;Shuffle;Trunk flexed;Step-through pattern Gait velocity: decr Gait velocity interpretation: Below normal speed for  age/gender General Gait Details: Cues for progression to step through pattern with emphasis on gait symmetry.  Cues for forward gaze and upper trunk control.     Stairs            Wheelchair Mobility    Modified Rankin (Stroke Patients Only)       Balance Overall balance assessment: Needs assistance   Sitting balance-Leahy Scale: Good       Standing balance-Leahy Scale: Poor Standing balance comment: heavily reliant on UE support.                              Cognition Arousal/Alertness: Awake/alert Behavior During Therapy: WFL for tasks assessed/performed Overall Cognitive Status: Within Functional Limits for tasks assessed                                        Exercises Total Joint Exercises Goniometric ROM: 67 degrees flexion measured in R knee.      General Comments        Pertinent Vitals/Pain Pain Assessment: 0-10 Pain Score: 8  Pain Location: R knee Pain Descriptors / Indicators: Aching;Sore Pain Intervention(s): Monitored during session;Repositioned;Ice applied    Home Living                      Prior Function            PT Goals (current goals can now be found in the care plan section) Acute Rehab PT  Goals Patient Stated Goal: Rehab and home Potential to Achieve Goals: Good Progress towards PT goals: Progressing toward goals    Frequency    7X/week      PT Plan Current plan remains appropriate    Co-evaluation              AM-PAC PT "6 Clicks" Daily Activity  Outcome Measure  Difficulty turning over in bed (including adjusting bedclothes, sheets and blankets)?: Unable Difficulty moving from lying on back to sitting on the side of the bed? : Unable Difficulty sitting down on and standing up from a chair with arms (e.g., wheelchair, bedside commode, etc,.)?: Unable Help needed moving to and from a bed to chair (including a wheelchair)?: A Little Help needed walking in hospital room?: A  Little Help needed climbing 3-5 steps with a railing? : A Lot 6 Click Score: 11    End of Session Equipment Utilized During Treatment: Gait belt Activity Tolerance: Patient tolerated treatment well Patient left: in bed;with call bell/phone within reach;in CPM Nurse Communication: Mobility status PT Visit Diagnosis: Difficulty in walking, not elsewhere classified (R26.2)     Time: [id number] PT Time Calculation (min) (ACUTE ONLY): 32 min  Charges:  $Gait Training: 8-22 mins $Therapeutic Activity: 8-22 mins                    G CodesJoycelyn Rua:       Amandine Covino, PTA pager [phone]    Florestine AversAimee J Herron Fero 06/22/2017, 1:49 PM

## 2017-06-23 LAB — CBC
HEMATOCRIT: 24.6 % — AB (ref 36.0–46.0)
Hemoglobin: 8.6 g/dL — ABNORMAL LOW (ref 12.0–15.0)
MCH: 30.8 pg (ref 26.0–34.0)
MCHC: 35 g/dL (ref 30.0–36.0)
MCV: 88.2 fL (ref 78.0–100.0)
Platelets: 114 10*3/uL — ABNORMAL LOW (ref 150–400)
RBC: 2.79 MIL/uL — ABNORMAL LOW (ref 3.87–5.11)
RDW: 14.5 % (ref 11.5–15.5)
WBC: 13.9 10*3/uL — AB (ref 4.0–10.5)

## 2017-06-23 LAB — GLUCOSE, CAPILLARY
GLUCOSE-CAPILLARY: 121 mg/dL — AB (ref 65–99)
GLUCOSE-CAPILLARY: 103 mg/dL — AB (ref 65–99)
GLUCOSE-CAPILLARY: 138 mg/dL — AB (ref 65–99)
Glucose-Capillary: 132 mg/dL — ABNORMAL HIGH (ref 65–99)

## 2017-06-23 NOTE — Progress Notes (Addendum)
  PATIENT ID: Connie DialMichelle Branch  MRN: [id number]  DOB/AGE:  1951/03/30 / 67 y.o.  2 Days Post-Op Procedure(s) (LRB): RIGHT TOTAL KNEE ARTHROPLASTY (Right)  Subjective: Pain worse today, worse with activity.  Reports pain in thigh especially. No c/o chest pain or SOB.   Tol PO solid, has/had nausea mostly associated with being up and moving, no vomiting Voiding OK, no BM  Objective: Vital signs in last 24 hours: Temp:  [98.2 F (36.8 C)-98.8 F (37.1 C)] 98.2 F (36.8 C) (02/17 0506) Pulse Rate:  [76-82] 76 (02/17 0506) Resp:  [15-17] 16 (02/17 0506) BP: (135-144)/(67-75) 136/67 (02/17 0506) SpO2:  [96 %-100 %] 96 % (02/17 0506)  Intake/Output from previous day: 02/16 0701 - 02/17 0700 In: 950 [P.O.:720; I.V.:230] Out: 1200 [Urine:1200] Intake/Output this shift: No intake/output data recorded.  Recent Labs    06/21/17 0650 06/22/17 0510 06/23/17 0522  HGB 11.9* 9.5* 8.6*   Recent Labs    06/22/17 0510 06/23/17 0522  WBC 14.6* 13.9*  RBC 3.21* 2.79*  HCT 28.8* 24.6*  PLT 122* 114*   Recent Labs    06/22/17 0510  NA 139  K 3.5  CL 103  CO2 25  BUN 20  CREATININE 0.77  GLUCOSE 170*  CALCIUM 8.6*   Recent Labs    06/21/17 0650  INR 0.87    Physical Exam: ABD soft Sensation intact distally Intact pulses distally Dorsiflexion/Plantar flexion intact Incision: dressing C/D/I Compartment soft  Thigh larger than left, but soft  Assessment/Plan: 2 Days Post-Op Procedure(s) (LRB): RIGHT TOTAL KNEE ARTHROPLASTY (Right)   Up with therapy Discharge to SNF planned for tomorrow Weight Bearing as Tolerated (WBAT)  VTE prophylaxis: pharmacologic prophylaxis (with any of the following: Eliquis)  Miralax today to assist in BM   Hartley Urton A. Janee Mornhompson, MD      Orthopaedic & Hand Surgery [facility] [address] [place], [place]  [id number] Office: [phone] Mobile: [phone]  06/23/2017, 9:43 AM

## 2017-06-23 NOTE — Progress Notes (Signed)
Physical Therapy Treatment Patient Details Name: Connie Branch MRN: [id number] DOB: [birth year] Today's Date: 06/23/2017    History of Present Illness Pt s/p R TKR  and with hx of DDD    PT Comments    Pt cooperative and progressing steadily with mobility despite ongoing pain and nausea.   Follow Up Recommendations  SNF     Equipment Recommendations  None recommended by PT    Recommendations for Other Services OT consult     Precautions / Restrictions Precautions Precautions: Knee;Fall Required Braces or Orthoses: Knee Immobilizer - Right Knee Immobilizer - Right: Discontinue once straight leg raise with < 10 degree lag Restrictions Weight Bearing Restrictions: No Other Position/Activity Restrictions: WBAT RLE    Mobility  Bed Mobility Overal bed mobility: Needs Assistance Bed Mobility: Sit to Supine       Sit to supine: Min assist   General bed mobility comments: cues for sequence with min assist for R LE  Transfers Overall transfer level: Needs assistance Equipment used: Rolling walker (2 wheeled) Transfers: Sit to/from Stand Sit to Stand: Min assist;Min guard         General transfer comment: cues for LE management and use of UEs to self assist  Ambulation/Gait Ambulation/Gait assistance: Min assist;Min guard Ambulation Distance (Feet): 120 Feet(and 15' back from bathroom) Assistive device: Rolling walker (2 wheeled) Gait Pattern/deviations: Decreased step length - right;Decreased step length - left;Shuffle;Trunk flexed;Step-through pattern Gait velocity: decr Gait velocity interpretation: Below normal speed for age/gender General Gait Details: cues for posture, position from RW and initial sequence   Stairs            Wheelchair Mobility    Modified Rankin (Stroke Patients Only)       Balance                                            Cognition Arousal/Alertness: Awake/alert Behavior During Therapy: WFL for  tasks assessed/performed Overall Cognitive Status: Within Functional Limits for tasks assessed                                        Exercises Total Joint Exercises Ankle Circles/Pumps: AROM;Both;15 reps;Supine Quad Sets: AROM;Both;15 reps;Supine Heel Slides: AAROM;Right;20 reps;Supine Straight Leg Raises: AAROM;Right;20 reps;Supine Goniometric ROM: AAROM R knee -10 - 40 Pain ltd    General Comments        Pertinent Vitals/Pain Pain Assessment: 0-10 Pain Score: 7  Pain Location: R knee Pain Descriptors / Indicators: Aching;Sore Pain Intervention(s): Limited activity within patient's tolerance;Monitored during session;Ice applied    Home Living                      Prior Function            PT Goals (current goals can now be found in the care plan section) Acute Rehab PT Goals Patient Stated Goal: Rehab and home PT Goal Formulation: With patient Time For Goal Achievement: 06/28/17 Potential to Achieve Goals: Good Progress towards PT goals: Progressing toward goals    Frequency    7X/week      PT Plan Current plan remains appropriate    Co-evaluation              AM-PAC PT "6 Clicks" Daily Activity  Outcome Measure  Difficulty turning over in bed (including adjusting bedclothes, sheets and blankets)?: Unable Difficulty moving from lying on back to sitting on the side of the bed? : Unable Difficulty sitting down on and standing up from a chair with arms (e.g., wheelchair, bedside commode, etc,.)?: Unable Help needed moving to and from a bed to chair (including a wheelchair)?: A Little Help needed walking in hospital room?: A Little Help needed climbing 3-5 steps with a railing? : A Lot 6 Click Score: 11    End of Session Equipment Utilized During Treatment: Gait belt Activity Tolerance: Patient tolerated treatment well Patient left: in bed;with call bell/phone within reach;with family/visitor present Nurse Communication:  Mobility status PT Visit Diagnosis: Difficulty in walking, not elsewhere classified (R26.2)     Time: 1325-1405 PT Time Calculation (min) (ACUTE ONLY): 40 min  Charges:  $Gait Training: 8-22 mins $Therapeutic Exercise: 8-22 mins $Therapeutic Activity: 8-22 mins                    G Codes:       Pg [phone]    Connie Branch 06/23/2017, 2:30 PM

## 2017-06-24 ENCOUNTER — Encounter (HOSPITAL_COMMUNITY): Payer: Self-pay | Admitting: Orthopedic Surgery

## 2017-06-24 DIAGNOSIS — D62 Acute posthemorrhagic anemia: Secondary | ICD-10-CM

## 2017-06-24 LAB — CBC
HCT: 26.4 % — ABNORMAL LOW (ref 36.0–46.0)
HEMOGLOBIN: 8.8 g/dL — AB (ref 12.0–15.0)
MCH: 29.4 pg (ref 26.0–34.0)
MCHC: 33.3 g/dL (ref 30.0–36.0)
MCV: 88.3 fL (ref 78.0–100.0)
PLATELETS: 113 10*3/uL — AB (ref 150–400)
RBC: 2.99 MIL/uL — AB (ref 3.87–5.11)
RDW: 14.3 % (ref 11.5–15.5)
WBC: 11.3 10*3/uL — AB (ref 4.0–10.5)

## 2017-06-24 LAB — GLUCOSE, CAPILLARY
GLUCOSE-CAPILLARY: 124 mg/dL — AB (ref 65–99)
GLUCOSE-CAPILLARY: 155 mg/dL — AB (ref 65–99)
Glucose-Capillary: 97 mg/dL (ref 65–99)
Glucose-Capillary: 130 mg/dL — ABNORMAL HIGH (ref 65–99)

## 2017-06-24 NOTE — Discharge Summary (Signed)
Patient ID: Connie Branch MRN: [id number] DOB/AGE: 1950-05-20 67 y.o.  Admit date: 06/21/2017 Discharge date: 06/24/2017  Admission Diagnoses:  Principal Problem:   Primary osteoarthritis of right knee Active Problems:   Primary osteoarthritis of left knee   Postoperative anemia due to acute blood loss   Discharge Diagnoses:  Same  Past Medical History:  Diagnosis Date  . ARF (acute renal failure) secondary to dehydration from osmotic diuresis 05/21/2013   KIDNEY FUNCTION NORMAL NOW  . Arthritis   . Cataracts, bilateral   . DDD (degenerative disc disease)   . DKA (diabetic ketoacidoses) ([place]) 05/21/2013   TYPE 2  . GERD (gastroesophageal reflux disease)   . Headache    SINUS  . High cholesterol   . Hypertension     Surgeries: Procedure(s): RIGHT TOTAL KNEE ARTHROPLASTY on 06/21/2017   Consultants:   Discharged Condition: Improved  Hospital Course: Connie Branch is an 67 y.o. female who was admitted 06/21/2017 for operative treatment ofPrimary osteoarthritis of right knee. Patient has severe unremitting pain that affects sleep, daily activities, and work/hobbies. After pre-op clearance the patient was taken to the operating room on 06/21/2017 and underwent  Procedure(s): RIGHT TOTAL KNEE ARTHROPLASTY.    Patient was given perioperative antibiotics:  Anti-infectives (From admission, onward)   Start     Dose/Rate Route Frequency Ordered Stop   06/21/17 1330  clindamycin (CLEOCIN) IVPB 600 mg     600 mg 100 mL/hr over 30 Minutes Intravenous Every 6 hours 06/21/17 1049 06/21/17 2059   06/21/17 0540  clindamycin (CLEOCIN) IVPB 900 mg     900 mg 100 mL/hr over 30 Minutes Intravenous On call to O.R. 06/21/17 0540 06/21/17 0755       Patient was given sequential compression devices, early ambulation, and chemoprophylaxis to prevent DVT.  Patient benefited maximally from hospital stay and there were no complications.    Recent vital signs:  Patient Vitals for the past  24 hrs:  BP Temp Temp src Pulse Resp SpO2  06/24/17 0617 124/64 98.8 F (37.1 C) Oral 80 16 96 %  06/23/17 2221 136/62 98 F (36.7 C) Oral 79 16 95 %  06/23/17 1451 (!) 154/77 98.8 F (37.1 C) Oral 79 17 100 %     Recent laboratory studies:  Recent Labs    06/22/17 0510 06/23/17 0522 06/24/17 0547  WBC 14.6* 13.9* 11.3*  HGB 9.5* 8.6* 8.8*  HCT 28.8* 24.6* 26.4*  PLT 122* 114* 113*  NA 139  --   --   K 3.5  --   --   CL 103  --   --   CO2 25  --   --   BUN 20  --   --   CREATININE 0.77  --   --   GLUCOSE 170*  --   --   CALCIUM 8.6*  --   --      Discharge Medications:   Allergies as of 06/24/2017      Reactions   Erythromycin Anaphylaxis   Zithromax [azithromycin] Anaphylaxis   Aspirin Nausea And Vomiting   Penicillins Other (See Comments)   Childhood allergy Has patient had a PCN reaction causing immediate rash, facial/tongue/throat swelling, SOB or lightheadedness with hypotension: Unknown Has patient had a PCN reaction causing severe rash involving mucus membranes or skin necrosis: Unknown Has patient had a PCN reaction that required hospitalization:Unknown Has patient had a PCN reaction occurring within the last 10 years:No If all of the above answers are "NO", then may  proceed with Cephalosporin use.      Medication List    TAKE these medications   acetaminophen 500 MG tablet Commonly known as:  TYLENOL Take 1,000 mg by mouth every 6 (six) hours as needed (for pain.).   apixaban 2.5 MG Tabs tablet Commonly known as:  ELIQUIS Take 1 tablet (2.5 mg total) by mouth 2 (two) times daily. Take times 3 weeks postop to decrease risk of blood clots.   blood glucose meter kit and supplies Use as instructed   docusate sodium 100 MG capsule Commonly known as:  COLACE Take 1 capsule (100 mg total) by mouth 2 (two) times daily.   fluticasone 50 MCG/ACT nasal spray Commonly known as:  FLONASE Place 1-2 sprays into both nostrils daily as needed for allergies.    losartan-hydrochlorothiazide 100-25 MG tablet Commonly known as:  HYZAAR Take 1 tablet by mouth daily.   MAALOX MAX PO Take 5-10 mLs by mouth 3 (three) times daily as needed (for acid reflux/heartburn.).   metFORMIN 500 MG tablet Commonly known as:  GLUCOPHAGE Take 500-1,000 mg by mouth daily at 6 PM. If CBG is greater than 200=2 tablets, less than 200=1 tablet (take between lunch and dinner)   omeprazole 40 MG capsule Commonly known as:  PRILOSEC Take 40 mg by mouth daily.   oxyCODONE-acetaminophen 5-325 MG tablet Commonly known as:  PERCOCET/ROXICET Take 1-2 tablets by mouth every 6 (six) hours as needed for severe pain.   simvastatin 40 MG tablet Commonly known as:  ZOCOR Take 40 mg by mouth daily.   tiZANidine 2 MG tablet Commonly known as:  ZANAFLEX Take 1 tablet (2 mg total) by mouth every 8 (eight) hours as needed for muscle spasms.   vitamin C 1000 MG tablet Take 1,000 mg by mouth daily.   Vitamin D (Ergocalciferol) 50000 units Caps capsule Commonly known as:  DRISDOL Take 50,000 Units by mouth every Friday.            Discharge Care Instructions  (From admission, onward)        Start     Ordered   06/24/17 0000  Weight bearing as tolerated    Question Answer Comment  Laterality right   Extremity Lower      06/24/17 0820      Diagnostic Studies: No results found.  Disposition: 06-Home-Health Care Svc  Discharge Instructions    CPM   Complete by:  As directed    Continuous passive motion machine (CPM):      Use the CPM from 0 to 60 for 8 hours per day.      You may increase by 5-10 per day.  You may break it up into 2 or 3 sessions per day.      Use CPM for 1-2 weeks or until you are told to stop.   Call MD / Call 911   Complete by:  As directed    If you experience chest pain or shortness of breath, CALL 911 and be transported to the hospital emergency room.  If you develope a fever above 101 F, pus (white drainage) or increased drainage  or redness at the wound, or calf pain, call your surgeon's office.   Constipation Prevention   Complete by:  As directed    Drink plenty of fluids.  Prune juice may be helpful.  You may use a stool softener, such as Colace (over the counter) 100 mg twice a day.  Use MiraLax (over the counter) for constipation as needed.  Diet Carb Modified   Complete by:  As directed    Do not put a pillow under the knee. Place it under the heel.   Complete by:  As directed    Increase activity slowly as tolerated   Complete by:  As directed    Weight bearing as tolerated   Complete by:  As directed    Laterality:  right   Extremity:  Lower      Follow-up Information    Dorna Leitz, MD. Schedule an appointment as soon as possible for a visit in 2 weeks.   Specialty:  Orthopedic Surgery Contact information: [place] [address] [phone]            Signed: Erlene Senters 06/24/2017, 8:23 AM  Patient ID: Connie Branch MRN: [id number] DOB/AGE: 1951-02-04 67 y.o.  Admit date: 06/21/2017 Discharge date: 06/24/2017  Admission Diagnoses:  Principal Problem:   Primary osteoarthritis of right knee Active Problems:   Primary osteoarthritis of left knee   Postoperative anemia due to acute blood loss   Discharge Diagnoses:  Same  Past Medical History:  Diagnosis Date  . ARF (acute renal failure) secondary to dehydration from osmotic diuresis 05/21/2013   KIDNEY FUNCTION NORMAL NOW  . Arthritis   . Cataracts, bilateral   . DDD (degenerative disc disease)   . DKA (diabetic ketoacidoses) ([place]) 05/21/2013   TYPE 2  . GERD (gastroesophageal reflux disease)   . Headache    SINUS  . High cholesterol   . Hypertension     Surgeries: Procedure(s): RIGHT TOTAL KNEE ARTHROPLASTY on 06/21/2017   Consultants:   Discharged Condition: Improved  Hospital Course: Connie Branch is an 67 y.o. female who was admitted 06/21/2017 for operative treatment ofPrimary  osteoarthritis of right knee. Patient has severe unremitting pain that affects sleep, daily activities, and work/hobbies. After pre-op clearance the patient was taken to the operating room on 06/21/2017 and underwent  Procedure(s): RIGHT TOTAL KNEE ARTHROPLASTY.    Patient was given perioperative antibiotics:  Anti-infectives (From admission, onward)   Start     Dose/Rate Route Frequency Ordered Stop   06/21/17 1330  clindamycin (CLEOCIN) IVPB 600 mg     600 mg 100 mL/hr over 30 Minutes Intravenous Every 6 hours 06/21/17 1049 06/21/17 2059   06/21/17 0540  clindamycin (CLEOCIN) IVPB 900 mg     900 mg 100 mL/hr over 30 Minutes Intravenous On call to O.R. 06/21/17 0540 06/21/17 0755       Patient was given sequential compression devices, early ambulation, and chemoprophylaxis to prevent DVT.  Patient benefited maximally from hospital stay and there were no complications.    Recent vital signs:  Patient Vitals for the past 24 hrs:  BP Temp Temp src Pulse Resp SpO2  06/24/17 0617 124/64 98.8 F (37.1 C) Oral 80 16 96 %  06/23/17 2221 136/62 98 F (36.7 C) Oral 79 16 95 %  06/23/17 1451 (!) 154/77 98.8 F (37.1 C) Oral 79 17 100 %     Recent laboratory studies:  Recent Labs    06/22/17 0510 06/23/17 0522 06/24/17 0547  WBC 14.6* 13.9* 11.3*  HGB 9.5* 8.6* 8.8*  HCT 28.8* 24.6* 26.4*  PLT 122* 114* 113*  NA 139  --   --   K 3.5  --   --   CL 103  --   --   CO2 25  --   --   BUN 20  --   --   CREATININE  0.77  --   --   GLUCOSE 170*  --   --   CALCIUM 8.6*  --   --      Discharge Medications:   Allergies as of 06/24/2017      Reactions   Erythromycin Anaphylaxis   Zithromax [azithromycin] Anaphylaxis   Aspirin Nausea And Vomiting   Penicillins Other (See Comments)   Childhood allergy Has patient had a PCN reaction causing immediate rash, facial/tongue/throat swelling, SOB or lightheadedness with hypotension: Unknown Has patient had a PCN reaction causing severe rash  involving mucus membranes or skin necrosis: Unknown Has patient had a PCN reaction that required hospitalization:Unknown Has patient had a PCN reaction occurring within the last 10 years:No If all of the above answers are "NO", then may proceed with Cephalosporin use.      Medication List    TAKE these medications   acetaminophen 500 MG tablet Commonly known as:  TYLENOL Take 1,000 mg by mouth every 6 (six) hours as needed (for pain.).   apixaban 2.5 MG Tabs tablet Commonly known as:  ELIQUIS Take 1 tablet (2.5 mg total) by mouth 2 (two) times daily. Take times 3 weeks postop to decrease risk of blood clots.   blood glucose meter kit and supplies Use as instructed   docusate sodium 100 MG capsule Commonly known as:  COLACE Take 1 capsule (100 mg total) by mouth 2 (two) times daily.   fluticasone 50 MCG/ACT nasal spray Commonly known as:  FLONASE Place 1-2 sprays into both nostrils daily as needed for allergies.   losartan-hydrochlorothiazide 100-25 MG tablet Commonly known as:  HYZAAR Take 1 tablet by mouth daily.   MAALOX MAX PO Take 5-10 mLs by mouth 3 (three) times daily as needed (for acid reflux/heartburn.).   metFORMIN 500 MG tablet Commonly known as:  GLUCOPHAGE Take 500-1,000 mg by mouth daily at 6 PM. If CBG is greater than 200=2 tablets, less than 200=1 tablet (take between lunch and dinner)   omeprazole 40 MG capsule Commonly known as:  PRILOSEC Take 40 mg by mouth daily.   oxyCODONE-acetaminophen 5-325 MG tablet Commonly known as:  PERCOCET/ROXICET Take 1-2 tablets by mouth every 6 (six) hours as needed for severe pain.   simvastatin 40 MG tablet Commonly known as:  ZOCOR Take 40 mg by mouth daily.   tiZANidine 2 MG tablet Commonly known as:  ZANAFLEX Take 1 tablet (2 mg total) by mouth every 8 (eight) hours as needed for muscle spasms.   vitamin C 1000 MG tablet Take 1,000 mg by mouth daily.   Vitamin D (Ergocalciferol) 50000 units Caps  capsule Commonly known as:  DRISDOL Take 50,000 Units by mouth every Friday.            Discharge Care Instructions  (From admission, onward)        Start     Ordered   06/24/17 0000  Weight bearing as tolerated    Question Answer Comment  Laterality right   Extremity Lower      06/24/17 0820      Diagnostic Studies: No results found.  Disposition: 06-Home-Health Care Svc  Discharge Instructions    CPM   Complete by:  As directed    Continuous passive motion machine (CPM):      Use the CPM from 0 to 60 for 8 hours per day.      You may increase by 5-10 per day.  You may break it up into 2 or 3 sessions per day.  Use CPM for 1-2 weeks or until you are told to stop.   Call MD / Call 911   Complete by:  As directed    If you experience chest pain or shortness of breath, CALL 911 and be transported to the hospital emergency room.  If you develope a fever above 101 F, pus (white drainage) or increased drainage or redness at the wound, or calf pain, call your surgeon's office.   Constipation Prevention   Complete by:  As directed    Drink plenty of fluids.  Prune juice may be helpful.  You may use a stool softener, such as Colace (over the counter) 100 mg twice a day.  Use MiraLax (over the counter) for constipation as needed.   Diet Carb Modified   Complete by:  As directed    Do not put a pillow under the knee. Place it under the heel.   Complete by:  As directed    Increase activity slowly as tolerated   Complete by:  As directed    Weight bearing as tolerated   Complete by:  As directed    Laterality:  right   Extremity:  Lower    The patient will need a CBC in 3 days.  Please call us at the office at [phone] if hemoglobin is below 8.0.  Follow-up Information    Dorna Leitz, MD. Schedule an appointment as soon as possible for a visit in 2 weeks.   Specialty:  Orthopedic Surgery Contact information: [place]  [address] [phone]            Signed: Erlene Senters 06/24/2017, 8:23 AM

## 2017-06-24 NOTE — Care Management Important Message (Signed)
Important Message  Patient Details  Name: Connie Branch MRN: [id number] Date of Birth: 1950/08/30   Medicare Important Message Given:  Yes    Caren MacadamFuller, Rosmery Duggin 06/24/2017, 10:24 AMImportant Message  Patient Details  Name: Connie DialMichelle Branch MRN: [id number] Date of Birth: 1950/08/30   Medicare Important Message Given:  Yes    Caren MacadamFuller, Bretta Fees 06/24/2017, 10:23 AM

## 2017-06-24 NOTE — Clinical Social Work Placement (Addendum)
Insurance Authorization Pending.   CLINICAL SOCIAL WORK PLACEMENT  NOTE  Date:  06/24/2017  Patient Details  Name: Connie Branch MRN: [id number] Date of Birth: 18-Nov-1950  Clinical Social Work is seeking post-discharge placement for this patient at the Skilled  Nursing Facility level of care (*CSW will initial, date and re-position this form in  chart as items are completed):  Yes   Patient/family provided with [facility] Clinical Social Work Department's list of facilities offering this level of care within the geographic area requested by the patient (or if unable, by the patient's family).  Yes   Patient/family informed of their freedom to choose among providers that offer the needed level of care, that participate in Medicare, Medicaid or managed care program needed by the patient, have an available bed and are willing to accept the patient.  Yes   Patient/family informed of [facility]'s ownership interest in [facility] and [facility], as well as of the fact that they are under no obligation to receive care at these facilities.  PASRR submitted to EDS on       PASRR number received on       Existing PASRR number confirmed on 06/24/17     FL2 transmitted to all facilities in geographic area requested by pt/family on       FL2 transmitted to all facilities within larger geographic area on       Patient informed that his/her managed care company has contracts with or will negotiate with certain facilities, including the following:  [facility]         Patient/family informed of bed offers received.  [facility]     Physician recommends and patient chooses bed at      [facility]  Patient to be transferred to facility by [facility]     Patient family notified on 06/24/17 of transfer.  Name of family member notified:        PHYSICIAN Please prepare priority discharge summary, including medications      Additional Comment:    _______________________________________________ Clearance CootsNicole A Saje Gallop, LCSW 06/24/2017, 1:52 PM

## 2017-06-24 NOTE — Progress Notes (Addendum)
Subjective: 3 Days Post-Op Procedure(s) (LRB): RIGHT TOTAL KNEE ARTHROPLASTY (Right) Patient reports pain as moderate.  Some nausea when she gets up.  She does not feel it is related to her pain medicine.  She reports that she cannot take aspirin because it makes her nauseated.  She also reports she cannot take iron because it makes her constipated.  Denies dizziness  Objective: Vital signs in last 24 hours: Temp:  [98 F (36.7 C)-98.8 F (37.1 C)] 98.8 F (37.1 C) (02/18 0617) Pulse Rate:  [79-80] 80 (02/18 0617) Resp:  [16-17] 16 (02/18 0617) BP: (124-154)/(62-77) 124/64 (02/18 0617) SpO2:  [95 %-100 %] 96 % (02/18 0617)  Intake/Output from previous day: 02/17 0701 - 02/18 0700 In: 360 [P.O.:360] Out: -  Intake/Output this shift: No intake/output data recorded.  Recent Labs    06/22/17 0510 06/23/17 0522 06/24/17 0547  HGB 9.5* 8.6* 8.8*   Recent Labs    06/23/17 0522 06/24/17 0547  WBC 13.9* 11.3*  RBC 2.79* 2.99*  HCT 24.6* 26.4*  PLT 114* 113*   Recent Labs    06/22/17 0510  NA 139  K 3.5  CL 103  CO2 25  BUN 20  CREATININE 0.77  GLUCOSE 170*  CALCIUM 8.6*   No results for input(s): LABPT, INR in the last 72 hours.    Right knee exam: Right knee dressing clean and dry.  Calf soft.  N/V intact distally. Minimal swelling noted  Assessment/Plan: 3 Days Post-Op Procedure(s) (LRB): RIGHT TOTAL KNEE ARTHROPLASTY (Right)  Acute blood loss anemia, asymptomatic. Plan: Up with therapy Discharge to SNF  Follow-up with Dr. Luiz BlareGraves in 2 weeks. Eliquis 2.5 mg twice daily until she is 3 weeks postop for DVT prophylaxis.  Laquenta Whitsell G 06/24/2017, 8:15 AM

## 2017-06-24 NOTE — Progress Notes (Signed)
Physical Therapy Treatment Patient Details Name: Connie Branch MRN: [id number] DOB: 1950-11-10 Today's Date: 06/24/2017    History of Present Illness Pt s/p R TKR  and with hx of DDD    PT Comments    POD # 3 am session Pt c/o a lot pain and "tightness".  Assisted out of recliner to amb to bathroom with increased time and unsteadiness  Amb a decreased distance due to pain.  Pt self limiting full WBing thru R LE causing increased balance instability.   HIGH FALL RISK.  Returned to room to perform some TKR TE's followed by ICE. Pt will need ST Rehab at SNF prior to safely retuning home.  .    Follow Up Recommendations  SNF     Equipment Recommendations  None recommended by PT    Recommendations for Other Services       Precautions / Restrictions Precautions Precautions: Knee;Fall Restrictions Weight Bearing Restrictions: No Other Position/Activity Restrictions: WBAT RLE    Mobility  Bed Mobility               General bed mobility comments: OOB in recliner  Transfers Overall transfer level: Needs assistance Equipment used: Rolling walker (2 wheeled) Transfers: Sit to/from Stand Sit to Stand: Min assist;Min guard         General transfer comment: cues for LE management and use of UEs to self assist   also assisted with toilet transfer  Ambulation/Gait Ambulation/Gait assistance: Min assist;Min guard Ambulation Distance (Feet): 75 Feet Assistive device: Rolling walker (2 wheeled) Gait Pattern/deviations: Decreased step length - right;Decreased step length - left;Shuffle;Trunk flexed;Step-through pattern Gait velocity: decreased   General Gait Details: decreased amb distance due to increased pain and "tightness".  Very unsteady gait with little WBing tolerance causing increased instability and decreased balance.     Stairs            Wheelchair Mobility    Modified Rankin (Stroke Patients Only)       Balance                                             Cognition Arousal/Alertness: Awake/alert Behavior During Therapy: WFL for tasks assessed/performed Overall Cognitive Status: Within Functional Limits for tasks assessed                                        Exercises   Total Knee Replacement TE's 10 reps B LE ankle pumps 10 reps knee presses 10 reps heel slides  10 reps SLR's 10 reps ABD Followed by ICE    General Comments        Pertinent Vitals/Pain Pain Score: 10-Worst pain ever Pain Location: R knee Pain Descriptors / Indicators: Aching;Sore;Operative site guarding Pain Intervention(s): Monitored during session;Repositioned;Premedicated before session;Ice applied    Home Living                      Prior Function            PT Goals (current goals can now be found in the care plan section) Progress towards PT goals: Progressing toward goals    Frequency    7X/week      PT Plan Current plan remains appropriate    Co-evaluation  AM-PAC PT "6 Clicks" Daily Activity  Outcome Measure  Difficulty turning over in bed (including adjusting bedclothes, sheets and blankets)?: A Lot Difficulty moving from lying on back to sitting on the side of the bed? : A Lot Difficulty sitting down on and standing up from a chair with arms (e.g., wheelchair, bedside commode, etc,.)?: A Lot   Help needed walking in hospital room?: A Lot Help needed climbing 3-5 steps with a railing? : A Lot 6 Click Score: 10    End of Session Equipment Utilized During Treatment: Gait belt Activity Tolerance: Patient limited by pain Patient left: in chair;with chair alarm set Nurse Communication: Mobility status PT Visit Diagnosis: Difficulty in walking, not elsewhere classified (R26.2)     Time: [id number] PT Time Calculation (min) (ACUTE ONLY): 30 min  Charges:  $Gait Training: 8-22 mins $Therapeutic Exercise: 8-22 mins                    G Codes:        Felecia ShellingLori Kayela Humphres  PTA [facility]  Acute  Rehab Pager      [phone]

## 2017-06-25 LAB — GLUCOSE, CAPILLARY
GLUCOSE-CAPILLARY: 107 mg/dL — AB (ref 65–99)
GLUCOSE-CAPILLARY: 94 mg/dL (ref 65–99)

## 2017-06-25 NOTE — Progress Notes (Signed)
Discharge planning, spoke with patient at beside. Chose [facility] for [facility] services, PT to eval and treat. Contacted [facility] for referral. Needs a RW, contacted [facility] to deliver to room, has a 3-n-1. [phone]

## 2017-06-25 NOTE — Progress Notes (Signed)
Physical Therapy Treatment Patient Details Name: Connie Branch MRN: [id number] DOB: 1951-02-01 Today's Date: 06/25/2017    History of Present Illness Pt s/p R TKR  and with hx of DDD    PT Comments    POD # 4 am session Assisted with amb a limited distance then to and from bathroom.  Performed some TKR TE's followed by ICE.  Pt progressing slowly ans will need ST Rehab at SNF  Follow Up Recommendations  SNF     Equipment Recommendations  None recommended by PT    Recommendations for Other Services       Precautions / Restrictions Precautions Precautions: Knee;Fall Restrictions Weight Bearing Restrictions: No Other Position/Activity Restrictions: WBAT RLE    Mobility  Bed Mobility               General bed mobility comments: OOB in recliner  Transfers Overall transfer level: Needs assistance Equipment used: Rolling walker (2 wheeled) Transfers: Sit to/from Stand Sit to Stand: Min assist;Min guard         General transfer comment: cues for LE management and use of UEs to self assist   also assisted with toilet transfer  Ambulation/Gait Ambulation/Gait assistance: Min assist;Min guard Ambulation Distance (Feet): 55 Feet Assistive device: Rolling walker (2 wheeled) Gait Pattern/deviations: Decreased step length - right;Decreased step length - left;Shuffle;Trunk flexed;Step-through pattern Gait velocity: decreased   General Gait Details: decreased amb distance due to increased pain and "tightness".  Very unsteady gait with little WBing tolerance causing increased instability and decreased balance.     Stairs            Wheelchair Mobility    Modified Rankin (Stroke Patients Only)       Balance                                            Cognition Arousal/Alertness: Awake/alert Behavior During Therapy: WFL for tasks assessed/performed Overall Cognitive Status: Within Functional Limits for tasks assessed                                         Exercises      General Comments        Pertinent Vitals/Pain Pain Assessment: Faces Faces Pain Scale: Hurts even more Pain Location: R knee Pain Descriptors / Indicators: Aching;Sore;Operative site guarding Pain Intervention(s): Monitored during session;Repositioned;Ice applied    Home Living                      Prior Function            PT Goals (current goals can now be found in the care plan section) Progress towards PT goals: Progressing toward goals    Frequency    7X/week      PT Plan Current plan remains appropriate    Co-evaluation              AM-PAC PT "6 Clicks" Daily Activity  Outcome Measure  Difficulty turning over in bed (including adjusting bedclothes, sheets and blankets)?: A Lot Difficulty moving from lying on back to sitting on the side of the bed? : A Lot Difficulty sitting down on and standing up from a chair with arms (e.g., wheelchair, bedside commode, etc,.)?: A Lot Help needed moving to and  from a bed to chair (including a wheelchair)?: A Little Help needed walking in hospital room?: A Lot Help needed climbing 3-5 steps with a railing? : A Lot 6 Click Score: 13    End of Session Equipment Utilized During Treatment: Gait belt Activity Tolerance: Patient limited by pain Patient left: in chair;with chair alarm set Nurse Communication: Mobility status PT Visit Diagnosis: Difficulty in walking, not elsewhere classified (R26.2)     Time: 1000-1026 PT Time Calculation (min) (ACUTE ONLY): 26 min  Charges:  $Gait Training: 8-22 mins $Therapeutic Activity: 8-22 mins                    G Codes:       Felecia ShellingLori Godwin Tedesco  PTA [facility]  Acute  Rehab Pager      [phone]

## 2017-06-25 NOTE — Progress Notes (Addendum)
CSW has been working with patient, nursing home and insurance company to expedite the insurance authorization for rehab placement.  [employer] Authorization process is still pending.   CSW is discussing additional discharge options at this time. -Patient prefers to go home and wait on SNF authorization FOR [facility]  instead of discharging to a private pay facility until [place]. Case Manager aware.  CSW will continue to follow.   Patient has decided to return home with support until authorization is received. CS[facility] she will contact [facility] once authorization is received. Insurance representative states it may take up to another 48-72 hours to determine if they will approve SNF  Stay for rehab.  Patient reports understanding.   Vivi BarrackNicole Tache Branch, Theresia MajorsLCSWA, MSW Clinical Social Worker  [phone] 06/25/2017  11:16 AM

## 2017-06-26 DIAGNOSIS — K5900 Constipation, unspecified: Secondary | ICD-10-CM | POA: Diagnosis not present

## 2017-06-26 DIAGNOSIS — I10 Essential (primary) hypertension: Secondary | ICD-10-CM | POA: Diagnosis not present

## 2017-06-26 DIAGNOSIS — M6281 Muscle weakness (generalized): Secondary | ICD-10-CM | POA: Diagnosis not present

## 2017-06-26 DIAGNOSIS — M1990 Unspecified osteoarthritis, unspecified site: Secondary | ICD-10-CM | POA: Diagnosis not present

## 2017-06-26 DIAGNOSIS — E119 Type 2 diabetes mellitus without complications: Secondary | ICD-10-CM | POA: Diagnosis not present

## 2017-06-26 DIAGNOSIS — Z96651 Presence of right artificial knee joint: Secondary | ICD-10-CM | POA: Diagnosis not present

## 2017-06-26 DIAGNOSIS — S7011XA Contusion of right thigh, initial encounter: Secondary | ICD-10-CM | POA: Diagnosis not present

## 2017-06-26 DIAGNOSIS — Z471 Aftercare following joint replacement surgery: Secondary | ICD-10-CM | POA: Diagnosis not present

## 2017-06-26 DIAGNOSIS — K219 Gastro-esophageal reflux disease without esophagitis: Secondary | ICD-10-CM | POA: Diagnosis not present

## 2017-06-26 DIAGNOSIS — D500 Iron deficiency anemia secondary to blood loss (chronic): Secondary | ICD-10-CM | POA: Diagnosis not present

## 2017-06-26 DIAGNOSIS — E785 Hyperlipidemia, unspecified: Secondary | ICD-10-CM | POA: Diagnosis not present

## 2017-06-26 DIAGNOSIS — H269 Unspecified cataract: Secondary | ICD-10-CM | POA: Diagnosis not present

## 2017-06-26 DIAGNOSIS — R2689 Other abnormalities of gait and mobility: Secondary | ICD-10-CM | POA: Diagnosis not present

## 2017-06-26 DIAGNOSIS — R69 Illness, unspecified: Secondary | ICD-10-CM | POA: Diagnosis not present

## 2017-06-26 DIAGNOSIS — M1711 Unilateral primary osteoarthritis, right knee: Secondary | ICD-10-CM | POA: Diagnosis not present

## 2017-06-26 DIAGNOSIS — R52 Pain, unspecified: Secondary | ICD-10-CM | POA: Diagnosis not present

## 2017-06-26 DIAGNOSIS — M62838 Other muscle spasm: Secondary | ICD-10-CM | POA: Diagnosis not present

## 2017-06-26 DIAGNOSIS — T8182XD Emphysema (subcutaneous) resulting from a procedure, subsequent encounter: Secondary | ICD-10-CM | POA: Diagnosis not present

## 2017-06-27 DIAGNOSIS — R52 Pain, unspecified: Secondary | ICD-10-CM | POA: Diagnosis not present

## 2017-06-27 DIAGNOSIS — E785 Hyperlipidemia, unspecified: Secondary | ICD-10-CM | POA: Diagnosis not present

## 2017-06-27 DIAGNOSIS — M1990 Unspecified osteoarthritis, unspecified site: Secondary | ICD-10-CM | POA: Diagnosis not present

## 2017-06-27 DIAGNOSIS — I10 Essential (primary) hypertension: Secondary | ICD-10-CM | POA: Diagnosis not present

## 2017-06-27 DIAGNOSIS — D500 Iron deficiency anemia secondary to blood loss (chronic): Secondary | ICD-10-CM | POA: Diagnosis not present

## 2017-06-27 DIAGNOSIS — M62838 Other muscle spasm: Secondary | ICD-10-CM | POA: Diagnosis not present

## 2017-06-27 DIAGNOSIS — M6281 Muscle weakness (generalized): Secondary | ICD-10-CM | POA: Diagnosis not present

## 2017-06-27 DIAGNOSIS — Z96651 Presence of right artificial knee joint: Secondary | ICD-10-CM | POA: Diagnosis not present

## 2017-06-27 DIAGNOSIS — E119 Type 2 diabetes mellitus without complications: Secondary | ICD-10-CM | POA: Diagnosis not present

## 2017-06-27 DIAGNOSIS — K5900 Constipation, unspecified: Secondary | ICD-10-CM | POA: Diagnosis not present

## 2017-06-28 DIAGNOSIS — E785 Hyperlipidemia, unspecified: Secondary | ICD-10-CM | POA: Diagnosis not present

## 2017-06-28 DIAGNOSIS — K5900 Constipation, unspecified: Secondary | ICD-10-CM | POA: Diagnosis not present

## 2017-06-28 DIAGNOSIS — M1990 Unspecified osteoarthritis, unspecified site: Secondary | ICD-10-CM | POA: Diagnosis not present

## 2017-06-28 DIAGNOSIS — E119 Type 2 diabetes mellitus without complications: Secondary | ICD-10-CM | POA: Diagnosis not present

## 2017-06-28 DIAGNOSIS — I10 Essential (primary) hypertension: Secondary | ICD-10-CM | POA: Diagnosis not present

## 2017-06-28 DIAGNOSIS — M6281 Muscle weakness (generalized): Secondary | ICD-10-CM | POA: Diagnosis not present

## 2017-06-28 DIAGNOSIS — Z96651 Presence of right artificial knee joint: Secondary | ICD-10-CM | POA: Diagnosis not present

## 2017-06-28 DIAGNOSIS — M62838 Other muscle spasm: Secondary | ICD-10-CM | POA: Diagnosis not present

## 2017-06-28 DIAGNOSIS — R52 Pain, unspecified: Secondary | ICD-10-CM | POA: Diagnosis not present

## 2017-06-28 DIAGNOSIS — D500 Iron deficiency anemia secondary to blood loss (chronic): Secondary | ICD-10-CM | POA: Diagnosis not present

## 2017-07-01 DIAGNOSIS — E119 Type 2 diabetes mellitus without complications: Secondary | ICD-10-CM | POA: Diagnosis not present

## 2017-07-01 DIAGNOSIS — E785 Hyperlipidemia, unspecified: Secondary | ICD-10-CM | POA: Diagnosis not present

## 2017-07-01 DIAGNOSIS — R52 Pain, unspecified: Secondary | ICD-10-CM | POA: Diagnosis not present

## 2017-07-01 DIAGNOSIS — M62838 Other muscle spasm: Secondary | ICD-10-CM | POA: Diagnosis not present

## 2017-07-01 DIAGNOSIS — M1990 Unspecified osteoarthritis, unspecified site: Secondary | ICD-10-CM | POA: Diagnosis not present

## 2017-07-01 DIAGNOSIS — I10 Essential (primary) hypertension: Secondary | ICD-10-CM | POA: Diagnosis not present

## 2017-07-01 DIAGNOSIS — M6281 Muscle weakness (generalized): Secondary | ICD-10-CM | POA: Diagnosis not present

## 2017-07-01 DIAGNOSIS — D500 Iron deficiency anemia secondary to blood loss (chronic): Secondary | ICD-10-CM | POA: Diagnosis not present

## 2017-07-01 DIAGNOSIS — S7011XA Contusion of right thigh, initial encounter: Secondary | ICD-10-CM | POA: Diagnosis not present

## 2017-07-01 DIAGNOSIS — Z96651 Presence of right artificial knee joint: Secondary | ICD-10-CM | POA: Diagnosis not present

## 2017-07-03 DIAGNOSIS — M6281 Muscle weakness (generalized): Secondary | ICD-10-CM | POA: Diagnosis not present

## 2017-07-03 DIAGNOSIS — E785 Hyperlipidemia, unspecified: Secondary | ICD-10-CM | POA: Diagnosis not present

## 2017-07-03 DIAGNOSIS — M1990 Unspecified osteoarthritis, unspecified site: Secondary | ICD-10-CM | POA: Diagnosis not present

## 2017-07-03 DIAGNOSIS — D500 Iron deficiency anemia secondary to blood loss (chronic): Secondary | ICD-10-CM | POA: Diagnosis not present

## 2017-07-03 DIAGNOSIS — M62838 Other muscle spasm: Secondary | ICD-10-CM | POA: Diagnosis not present

## 2017-07-03 DIAGNOSIS — R52 Pain, unspecified: Secondary | ICD-10-CM | POA: Diagnosis not present

## 2017-07-03 DIAGNOSIS — S7011XA Contusion of right thigh, initial encounter: Secondary | ICD-10-CM | POA: Diagnosis not present

## 2017-07-03 DIAGNOSIS — Z96651 Presence of right artificial knee joint: Secondary | ICD-10-CM | POA: Diagnosis not present

## 2017-07-03 DIAGNOSIS — E119 Type 2 diabetes mellitus without complications: Secondary | ICD-10-CM | POA: Diagnosis not present

## 2017-07-03 DIAGNOSIS — I10 Essential (primary) hypertension: Secondary | ICD-10-CM | POA: Diagnosis not present

## 2017-07-05 DIAGNOSIS — M6281 Muscle weakness (generalized): Secondary | ICD-10-CM | POA: Diagnosis not present

## 2017-07-05 DIAGNOSIS — E785 Hyperlipidemia, unspecified: Secondary | ICD-10-CM | POA: Diagnosis not present

## 2017-07-05 DIAGNOSIS — S7011XA Contusion of right thigh, initial encounter: Secondary | ICD-10-CM | POA: Diagnosis not present

## 2017-07-05 DIAGNOSIS — M62838 Other muscle spasm: Secondary | ICD-10-CM | POA: Diagnosis not present

## 2017-07-05 DIAGNOSIS — R52 Pain, unspecified: Secondary | ICD-10-CM | POA: Diagnosis not present

## 2017-07-05 DIAGNOSIS — D500 Iron deficiency anemia secondary to blood loss (chronic): Secondary | ICD-10-CM | POA: Diagnosis not present

## 2017-07-05 DIAGNOSIS — I10 Essential (primary) hypertension: Secondary | ICD-10-CM | POA: Diagnosis not present

## 2017-07-05 DIAGNOSIS — E119 Type 2 diabetes mellitus without complications: Secondary | ICD-10-CM | POA: Diagnosis not present

## 2017-07-05 DIAGNOSIS — Z96651 Presence of right artificial knee joint: Secondary | ICD-10-CM | POA: Diagnosis not present

## 2017-07-05 DIAGNOSIS — M1990 Unspecified osteoarthritis, unspecified site: Secondary | ICD-10-CM | POA: Diagnosis not present

## 2017-07-08 DIAGNOSIS — M1711 Unilateral primary osteoarthritis, right knee: Secondary | ICD-10-CM | POA: Diagnosis not present

## 2017-07-08 DIAGNOSIS — Z96651 Presence of right artificial knee joint: Secondary | ICD-10-CM | POA: Diagnosis not present

## 2017-07-08 DIAGNOSIS — Z471 Aftercare following joint replacement surgery: Secondary | ICD-10-CM | POA: Diagnosis not present

## 2017-07-10 DIAGNOSIS — Z96651 Presence of right artificial knee joint: Secondary | ICD-10-CM | POA: Diagnosis not present

## 2017-07-10 DIAGNOSIS — D649 Anemia, unspecified: Secondary | ICD-10-CM | POA: Diagnosis not present

## 2017-07-10 DIAGNOSIS — M5136 Other intervertebral disc degeneration, lumbar region: Secondary | ICD-10-CM | POA: Diagnosis not present

## 2017-07-10 DIAGNOSIS — E119 Type 2 diabetes mellitus without complications: Secondary | ICD-10-CM | POA: Diagnosis not present

## 2017-07-10 DIAGNOSIS — Z471 Aftercare following joint replacement surgery: Secondary | ICD-10-CM | POA: Diagnosis not present

## 2017-07-10 DIAGNOSIS — I10 Essential (primary) hypertension: Secondary | ICD-10-CM | POA: Diagnosis not present

## 2017-07-13 DIAGNOSIS — I10 Essential (primary) hypertension: Secondary | ICD-10-CM | POA: Diagnosis not present

## 2017-07-13 DIAGNOSIS — D649 Anemia, unspecified: Secondary | ICD-10-CM | POA: Diagnosis not present

## 2017-07-13 DIAGNOSIS — E119 Type 2 diabetes mellitus without complications: Secondary | ICD-10-CM | POA: Diagnosis not present

## 2017-07-13 DIAGNOSIS — Z471 Aftercare following joint replacement surgery: Secondary | ICD-10-CM | POA: Diagnosis not present

## 2017-07-13 DIAGNOSIS — M5136 Other intervertebral disc degeneration, lumbar region: Secondary | ICD-10-CM | POA: Diagnosis not present

## 2017-07-15 DIAGNOSIS — M5136 Other intervertebral disc degeneration, lumbar region: Secondary | ICD-10-CM | POA: Diagnosis not present

## 2017-07-15 DIAGNOSIS — I10 Essential (primary) hypertension: Secondary | ICD-10-CM | POA: Diagnosis not present

## 2017-07-15 DIAGNOSIS — E119 Type 2 diabetes mellitus without complications: Secondary | ICD-10-CM | POA: Diagnosis not present

## 2017-07-15 DIAGNOSIS — Z471 Aftercare following joint replacement surgery: Secondary | ICD-10-CM | POA: Diagnosis not present

## 2017-07-15 DIAGNOSIS — D649 Anemia, unspecified: Secondary | ICD-10-CM | POA: Diagnosis not present

## 2017-07-17 DIAGNOSIS — D649 Anemia, unspecified: Secondary | ICD-10-CM | POA: Diagnosis not present

## 2017-07-17 DIAGNOSIS — M5136 Other intervertebral disc degeneration, lumbar region: Secondary | ICD-10-CM | POA: Diagnosis not present

## 2017-07-17 DIAGNOSIS — Z471 Aftercare following joint replacement surgery: Secondary | ICD-10-CM | POA: Diagnosis not present

## 2017-07-17 DIAGNOSIS — I10 Essential (primary) hypertension: Secondary | ICD-10-CM | POA: Diagnosis not present

## 2017-07-17 DIAGNOSIS — E119 Type 2 diabetes mellitus without complications: Secondary | ICD-10-CM | POA: Diagnosis not present

## 2017-07-18 DIAGNOSIS — Z96651 Presence of right artificial knee joint: Secondary | ICD-10-CM | POA: Diagnosis not present

## 2017-07-18 DIAGNOSIS — M1711 Unilateral primary osteoarthritis, right knee: Secondary | ICD-10-CM | POA: Diagnosis not present

## 2017-07-18 DIAGNOSIS — Z471 Aftercare following joint replacement surgery: Secondary | ICD-10-CM | POA: Diagnosis not present

## 2017-07-19 DIAGNOSIS — I10 Essential (primary) hypertension: Secondary | ICD-10-CM | POA: Diagnosis not present

## 2017-07-19 DIAGNOSIS — E119 Type 2 diabetes mellitus without complications: Secondary | ICD-10-CM | POA: Diagnosis not present

## 2017-07-19 DIAGNOSIS — Z471 Aftercare following joint replacement surgery: Secondary | ICD-10-CM | POA: Diagnosis not present

## 2017-07-19 DIAGNOSIS — D649 Anemia, unspecified: Secondary | ICD-10-CM | POA: Diagnosis not present

## 2017-07-19 DIAGNOSIS — M5136 Other intervertebral disc degeneration, lumbar region: Secondary | ICD-10-CM | POA: Diagnosis not present

## 2017-07-20 DIAGNOSIS — E119 Type 2 diabetes mellitus without complications: Secondary | ICD-10-CM | POA: Diagnosis not present

## 2017-07-20 DIAGNOSIS — M5136 Other intervertebral disc degeneration, lumbar region: Secondary | ICD-10-CM | POA: Diagnosis not present

## 2017-07-20 DIAGNOSIS — I10 Essential (primary) hypertension: Secondary | ICD-10-CM | POA: Diagnosis not present

## 2017-07-20 DIAGNOSIS — D649 Anemia, unspecified: Secondary | ICD-10-CM | POA: Diagnosis not present

## 2017-07-20 DIAGNOSIS — Z471 Aftercare following joint replacement surgery: Secondary | ICD-10-CM | POA: Diagnosis not present

## 2017-07-22 DIAGNOSIS — E119 Type 2 diabetes mellitus without complications: Secondary | ICD-10-CM | POA: Diagnosis not present

## 2017-07-22 DIAGNOSIS — I10 Essential (primary) hypertension: Secondary | ICD-10-CM | POA: Diagnosis not present

## 2017-07-22 DIAGNOSIS — Z471 Aftercare following joint replacement surgery: Secondary | ICD-10-CM | POA: Diagnosis not present

## 2017-07-22 DIAGNOSIS — M5136 Other intervertebral disc degeneration, lumbar region: Secondary | ICD-10-CM | POA: Diagnosis not present

## 2017-07-22 DIAGNOSIS — D649 Anemia, unspecified: Secondary | ICD-10-CM | POA: Diagnosis not present

## 2017-07-24 DIAGNOSIS — D649 Anemia, unspecified: Secondary | ICD-10-CM | POA: Diagnosis not present

## 2017-07-24 DIAGNOSIS — M5136 Other intervertebral disc degeneration, lumbar region: Secondary | ICD-10-CM | POA: Diagnosis not present

## 2017-07-24 DIAGNOSIS — Z471 Aftercare following joint replacement surgery: Secondary | ICD-10-CM | POA: Diagnosis not present

## 2017-07-24 DIAGNOSIS — E119 Type 2 diabetes mellitus without complications: Secondary | ICD-10-CM | POA: Diagnosis not present

## 2017-07-24 DIAGNOSIS — I10 Essential (primary) hypertension: Secondary | ICD-10-CM | POA: Diagnosis not present

## 2017-07-25 DIAGNOSIS — D649 Anemia, unspecified: Secondary | ICD-10-CM | POA: Diagnosis not present

## 2017-07-25 DIAGNOSIS — M25661 Stiffness of right knee, not elsewhere classified: Secondary | ICD-10-CM | POA: Diagnosis not present

## 2017-07-25 DIAGNOSIS — Z471 Aftercare following joint replacement surgery: Secondary | ICD-10-CM | POA: Diagnosis not present

## 2017-07-25 DIAGNOSIS — Z96651 Presence of right artificial knee joint: Secondary | ICD-10-CM | POA: Diagnosis not present

## 2017-07-25 DIAGNOSIS — M5136 Other intervertebral disc degeneration, lumbar region: Secondary | ICD-10-CM | POA: Diagnosis not present

## 2017-07-25 DIAGNOSIS — E119 Type 2 diabetes mellitus without complications: Secondary | ICD-10-CM | POA: Diagnosis not present

## 2017-07-25 DIAGNOSIS — I10 Essential (primary) hypertension: Secondary | ICD-10-CM | POA: Diagnosis not present

## 2017-07-25 DIAGNOSIS — M25561 Pain in right knee: Secondary | ICD-10-CM | POA: Diagnosis not present

## 2017-08-01 DIAGNOSIS — Z96651 Presence of right artificial knee joint: Secondary | ICD-10-CM | POA: Diagnosis not present

## 2017-08-01 DIAGNOSIS — M25661 Stiffness of right knee, not elsewhere classified: Secondary | ICD-10-CM | POA: Diagnosis not present

## 2017-08-01 DIAGNOSIS — M25561 Pain in right knee: Secondary | ICD-10-CM | POA: Diagnosis not present

## 2017-08-02 DIAGNOSIS — M25661 Stiffness of right knee, not elsewhere classified: Secondary | ICD-10-CM | POA: Diagnosis not present

## 2017-08-02 DIAGNOSIS — Z96651 Presence of right artificial knee joint: Secondary | ICD-10-CM | POA: Diagnosis not present

## 2017-08-02 DIAGNOSIS — M25561 Pain in right knee: Secondary | ICD-10-CM | POA: Diagnosis not present

## 2017-08-06 DIAGNOSIS — Z96651 Presence of right artificial knee joint: Secondary | ICD-10-CM | POA: Diagnosis not present

## 2017-08-06 DIAGNOSIS — M25561 Pain in right knee: Secondary | ICD-10-CM | POA: Diagnosis not present

## 2017-08-06 DIAGNOSIS — M25661 Stiffness of right knee, not elsewhere classified: Secondary | ICD-10-CM | POA: Diagnosis not present

## 2017-08-06 DIAGNOSIS — Z471 Aftercare following joint replacement surgery: Secondary | ICD-10-CM | POA: Diagnosis not present

## 2017-08-06 DIAGNOSIS — M1712 Unilateral primary osteoarthritis, left knee: Secondary | ICD-10-CM | POA: Diagnosis not present

## 2017-08-12 DIAGNOSIS — M24661 Ankylosis, right knee: Secondary | ICD-10-CM | POA: Diagnosis not present

## 2017-08-12 DIAGNOSIS — Z96651 Presence of right artificial knee joint: Secondary | ICD-10-CM | POA: Diagnosis not present

## 2017-08-12 DIAGNOSIS — M25661 Stiffness of right knee, not elsewhere classified: Secondary | ICD-10-CM | POA: Diagnosis not present

## 2017-08-12 DIAGNOSIS — G8918 Other acute postprocedural pain: Secondary | ICD-10-CM | POA: Diagnosis not present

## 2017-08-13 DIAGNOSIS — Z96651 Presence of right artificial knee joint: Secondary | ICD-10-CM | POA: Diagnosis not present

## 2017-08-13 DIAGNOSIS — M25661 Stiffness of right knee, not elsewhere classified: Secondary | ICD-10-CM | POA: Diagnosis not present

## 2017-08-13 DIAGNOSIS — M25561 Pain in right knee: Secondary | ICD-10-CM | POA: Diagnosis not present

## 2017-08-15 DIAGNOSIS — M25661 Stiffness of right knee, not elsewhere classified: Secondary | ICD-10-CM | POA: Diagnosis not present

## 2017-08-15 DIAGNOSIS — Z96651 Presence of right artificial knee joint: Secondary | ICD-10-CM | POA: Diagnosis not present

## 2017-08-15 DIAGNOSIS — M25561 Pain in right knee: Secondary | ICD-10-CM | POA: Diagnosis not present

## 2017-08-16 DIAGNOSIS — M25561 Pain in right knee: Secondary | ICD-10-CM | POA: Diagnosis not present

## 2017-08-16 DIAGNOSIS — M25661 Stiffness of right knee, not elsewhere classified: Secondary | ICD-10-CM | POA: Diagnosis not present

## 2017-08-16 DIAGNOSIS — Z96651 Presence of right artificial knee joint: Secondary | ICD-10-CM | POA: Diagnosis not present

## 2017-08-20 DIAGNOSIS — M25661 Stiffness of right knee, not elsewhere classified: Secondary | ICD-10-CM | POA: Diagnosis not present

## 2017-08-20 DIAGNOSIS — M5441 Lumbago with sciatica, right side: Secondary | ICD-10-CM | POA: Diagnosis not present

## 2017-08-20 DIAGNOSIS — M25561 Pain in right knee: Secondary | ICD-10-CM | POA: Diagnosis not present

## 2017-08-20 DIAGNOSIS — M545 Low back pain: Secondary | ICD-10-CM | POA: Diagnosis not present

## 2017-08-20 DIAGNOSIS — Z96651 Presence of right artificial knee joint: Secondary | ICD-10-CM | POA: Diagnosis not present

## 2017-08-20 DIAGNOSIS — R69 Illness, unspecified: Secondary | ICD-10-CM | POA: Diagnosis not present

## 2017-08-22 DIAGNOSIS — M25661 Stiffness of right knee, not elsewhere classified: Secondary | ICD-10-CM | POA: Diagnosis not present

## 2017-08-22 DIAGNOSIS — M7661 Achilles tendinitis, right leg: Secondary | ICD-10-CM | POA: Diagnosis not present

## 2017-08-22 DIAGNOSIS — Z96651 Presence of right artificial knee joint: Secondary | ICD-10-CM | POA: Diagnosis not present

## 2017-08-22 DIAGNOSIS — M25571 Pain in right ankle and joints of right foot: Secondary | ICD-10-CM | POA: Diagnosis not present

## 2017-08-22 DIAGNOSIS — M25561 Pain in right knee: Secondary | ICD-10-CM | POA: Diagnosis not present

## 2017-08-26 DIAGNOSIS — M25571 Pain in right ankle and joints of right foot: Secondary | ICD-10-CM | POA: Diagnosis not present

## 2017-08-26 DIAGNOSIS — M7661 Achilles tendinitis, right leg: Secondary | ICD-10-CM | POA: Diagnosis not present

## 2017-08-27 DIAGNOSIS — M25561 Pain in right knee: Secondary | ICD-10-CM | POA: Diagnosis not present

## 2017-08-27 DIAGNOSIS — M25661 Stiffness of right knee, not elsewhere classified: Secondary | ICD-10-CM | POA: Diagnosis not present

## 2017-08-27 DIAGNOSIS — Z96651 Presence of right artificial knee joint: Secondary | ICD-10-CM | POA: Diagnosis not present

## 2017-08-29 DIAGNOSIS — M25561 Pain in right knee: Secondary | ICD-10-CM | POA: Diagnosis not present

## 2017-08-29 DIAGNOSIS — M25661 Stiffness of right knee, not elsewhere classified: Secondary | ICD-10-CM | POA: Diagnosis not present

## 2017-08-29 DIAGNOSIS — Z96651 Presence of right artificial knee joint: Secondary | ICD-10-CM | POA: Diagnosis not present

## 2017-08-30 DIAGNOSIS — Z96651 Presence of right artificial knee joint: Secondary | ICD-10-CM | POA: Diagnosis not present

## 2017-08-30 DIAGNOSIS — M25661 Stiffness of right knee, not elsewhere classified: Secondary | ICD-10-CM | POA: Diagnosis not present

## 2017-08-30 DIAGNOSIS — M25561 Pain in right knee: Secondary | ICD-10-CM | POA: Diagnosis not present

## 2017-09-03 DIAGNOSIS — M25561 Pain in right knee: Secondary | ICD-10-CM | POA: Diagnosis not present

## 2017-09-03 DIAGNOSIS — Z96651 Presence of right artificial knee joint: Secondary | ICD-10-CM | POA: Diagnosis not present

## 2017-09-03 DIAGNOSIS — M25661 Stiffness of right knee, not elsewhere classified: Secondary | ICD-10-CM | POA: Diagnosis not present

## 2017-09-05 DIAGNOSIS — M25661 Stiffness of right knee, not elsewhere classified: Secondary | ICD-10-CM | POA: Diagnosis not present

## 2017-09-05 DIAGNOSIS — M25561 Pain in right knee: Secondary | ICD-10-CM | POA: Diagnosis not present

## 2017-09-05 DIAGNOSIS — Z96651 Presence of right artificial knee joint: Secondary | ICD-10-CM | POA: Diagnosis not present

## 2017-09-06 DIAGNOSIS — Z471 Aftercare following joint replacement surgery: Secondary | ICD-10-CM | POA: Diagnosis not present

## 2017-09-06 DIAGNOSIS — M25561 Pain in right knee: Secondary | ICD-10-CM | POA: Diagnosis not present

## 2017-09-06 DIAGNOSIS — M25661 Stiffness of right knee, not elsewhere classified: Secondary | ICD-10-CM | POA: Diagnosis not present

## 2017-09-06 DIAGNOSIS — Z96651 Presence of right artificial knee joint: Secondary | ICD-10-CM | POA: Diagnosis not present

## 2017-09-07 DIAGNOSIS — E1165 Type 2 diabetes mellitus with hyperglycemia: Secondary | ICD-10-CM | POA: Diagnosis not present

## 2017-09-07 DIAGNOSIS — E1136 Type 2 diabetes mellitus with diabetic cataract: Secondary | ICD-10-CM | POA: Diagnosis not present

## 2017-09-07 DIAGNOSIS — M25562 Pain in left knee: Secondary | ICD-10-CM | POA: Diagnosis not present

## 2017-09-07 DIAGNOSIS — E1142 Type 2 diabetes mellitus with diabetic polyneuropathy: Secondary | ICD-10-CM | POA: Diagnosis not present

## 2017-09-07 DIAGNOSIS — E559 Vitamin D deficiency, unspecified: Secondary | ICD-10-CM | POA: Diagnosis not present

## 2017-09-07 DIAGNOSIS — K219 Gastro-esophageal reflux disease without esophagitis: Secondary | ICD-10-CM | POA: Diagnosis not present

## 2017-09-07 DIAGNOSIS — E6609 Other obesity due to excess calories: Secondary | ICD-10-CM | POA: Diagnosis not present

## 2017-09-07 DIAGNOSIS — R69 Illness, unspecified: Secondary | ICD-10-CM | POA: Diagnosis not present

## 2017-09-07 DIAGNOSIS — I10 Essential (primary) hypertension: Secondary | ICD-10-CM | POA: Diagnosis not present

## 2017-09-10 DIAGNOSIS — Z96651 Presence of right artificial knee joint: Secondary | ICD-10-CM | POA: Diagnosis not present

## 2017-09-10 DIAGNOSIS — M25661 Stiffness of right knee, not elsewhere classified: Secondary | ICD-10-CM | POA: Diagnosis not present

## 2017-09-10 DIAGNOSIS — M25561 Pain in right knee: Secondary | ICD-10-CM | POA: Diagnosis not present

## 2017-09-12 DIAGNOSIS — Z96651 Presence of right artificial knee joint: Secondary | ICD-10-CM | POA: Diagnosis not present

## 2017-09-12 DIAGNOSIS — M25661 Stiffness of right knee, not elsewhere classified: Secondary | ICD-10-CM | POA: Diagnosis not present

## 2017-09-12 DIAGNOSIS — M25561 Pain in right knee: Secondary | ICD-10-CM | POA: Diagnosis not present

## 2017-09-12 DIAGNOSIS — Z9889 Other specified postprocedural states: Secondary | ICD-10-CM | POA: Diagnosis not present

## 2017-09-13 DIAGNOSIS — M25561 Pain in right knee: Secondary | ICD-10-CM | POA: Diagnosis not present

## 2017-09-13 DIAGNOSIS — Z96651 Presence of right artificial knee joint: Secondary | ICD-10-CM | POA: Diagnosis not present

## 2017-09-13 DIAGNOSIS — M25661 Stiffness of right knee, not elsewhere classified: Secondary | ICD-10-CM | POA: Diagnosis not present

## 2017-09-17 DIAGNOSIS — M25561 Pain in right knee: Secondary | ICD-10-CM | POA: Diagnosis not present

## 2017-09-17 DIAGNOSIS — Z96651 Presence of right artificial knee joint: Secondary | ICD-10-CM | POA: Diagnosis not present

## 2017-09-17 DIAGNOSIS — M25661 Stiffness of right knee, not elsewhere classified: Secondary | ICD-10-CM | POA: Diagnosis not present

## 2017-09-19 DIAGNOSIS — M25561 Pain in right knee: Secondary | ICD-10-CM | POA: Diagnosis not present

## 2017-09-19 DIAGNOSIS — M25661 Stiffness of right knee, not elsewhere classified: Secondary | ICD-10-CM | POA: Diagnosis not present

## 2017-09-19 DIAGNOSIS — Z96651 Presence of right artificial knee joint: Secondary | ICD-10-CM | POA: Diagnosis not present

## 2017-09-20 DIAGNOSIS — M25561 Pain in right knee: Secondary | ICD-10-CM | POA: Diagnosis not present

## 2017-09-20 DIAGNOSIS — M25661 Stiffness of right knee, not elsewhere classified: Secondary | ICD-10-CM | POA: Diagnosis not present

## 2017-09-20 DIAGNOSIS — Z96651 Presence of right artificial knee joint: Secondary | ICD-10-CM | POA: Diagnosis not present

## 2017-09-24 DIAGNOSIS — Z96651 Presence of right artificial knee joint: Secondary | ICD-10-CM | POA: Diagnosis not present

## 2017-09-24 DIAGNOSIS — M25661 Stiffness of right knee, not elsewhere classified: Secondary | ICD-10-CM | POA: Diagnosis not present

## 2017-09-24 DIAGNOSIS — M25561 Pain in right knee: Secondary | ICD-10-CM | POA: Diagnosis not present

## 2017-09-26 DIAGNOSIS — M25561 Pain in right knee: Secondary | ICD-10-CM | POA: Diagnosis not present

## 2017-09-26 DIAGNOSIS — Z96651 Presence of right artificial knee joint: Secondary | ICD-10-CM | POA: Diagnosis not present

## 2017-09-26 DIAGNOSIS — M25661 Stiffness of right knee, not elsewhere classified: Secondary | ICD-10-CM | POA: Diagnosis not present

## 2017-09-27 DIAGNOSIS — M25661 Stiffness of right knee, not elsewhere classified: Secondary | ICD-10-CM | POA: Diagnosis not present

## 2017-09-27 DIAGNOSIS — Z96651 Presence of right artificial knee joint: Secondary | ICD-10-CM | POA: Diagnosis not present

## 2017-09-27 DIAGNOSIS — M25561 Pain in right knee: Secondary | ICD-10-CM | POA: Diagnosis not present

## 2017-10-01 DIAGNOSIS — M25661 Stiffness of right knee, not elsewhere classified: Secondary | ICD-10-CM | POA: Diagnosis not present

## 2017-10-01 DIAGNOSIS — Z96651 Presence of right artificial knee joint: Secondary | ICD-10-CM | POA: Diagnosis not present

## 2017-10-01 DIAGNOSIS — M25561 Pain in right knee: Secondary | ICD-10-CM | POA: Diagnosis not present

## 2017-10-03 DIAGNOSIS — M25561 Pain in right knee: Secondary | ICD-10-CM | POA: Diagnosis not present

## 2017-10-03 DIAGNOSIS — M25462 Effusion, left knee: Secondary | ICD-10-CM | POA: Diagnosis not present

## 2017-10-03 DIAGNOSIS — Z96651 Presence of right artificial knee joint: Secondary | ICD-10-CM | POA: Diagnosis not present

## 2017-10-03 DIAGNOSIS — M25661 Stiffness of right knee, not elsewhere classified: Secondary | ICD-10-CM | POA: Diagnosis not present

## 2017-10-04 DIAGNOSIS — M25561 Pain in right knee: Secondary | ICD-10-CM | POA: Diagnosis not present

## 2017-10-04 DIAGNOSIS — M25661 Stiffness of right knee, not elsewhere classified: Secondary | ICD-10-CM | POA: Diagnosis not present

## 2017-10-04 DIAGNOSIS — Z96651 Presence of right artificial knee joint: Secondary | ICD-10-CM | POA: Diagnosis not present

## 2017-10-08 DIAGNOSIS — M25561 Pain in right knee: Secondary | ICD-10-CM | POA: Diagnosis not present

## 2017-10-08 DIAGNOSIS — Z96651 Presence of right artificial knee joint: Secondary | ICD-10-CM | POA: Diagnosis not present

## 2017-10-08 DIAGNOSIS — M25661 Stiffness of right knee, not elsewhere classified: Secondary | ICD-10-CM | POA: Diagnosis not present

## 2017-10-15 DIAGNOSIS — M25561 Pain in right knee: Secondary | ICD-10-CM | POA: Diagnosis not present

## 2017-10-15 DIAGNOSIS — M25661 Stiffness of right knee, not elsewhere classified: Secondary | ICD-10-CM | POA: Diagnosis not present

## 2017-10-15 DIAGNOSIS — Z96651 Presence of right artificial knee joint: Secondary | ICD-10-CM | POA: Diagnosis not present

## 2017-10-17 DIAGNOSIS — M25661 Stiffness of right knee, not elsewhere classified: Secondary | ICD-10-CM | POA: Diagnosis not present

## 2017-10-17 DIAGNOSIS — M25561 Pain in right knee: Secondary | ICD-10-CM | POA: Diagnosis not present

## 2017-10-17 DIAGNOSIS — Z96651 Presence of right artificial knee joint: Secondary | ICD-10-CM | POA: Diagnosis not present

## 2017-10-18 DIAGNOSIS — M25561 Pain in right knee: Secondary | ICD-10-CM | POA: Diagnosis not present

## 2017-10-18 DIAGNOSIS — M25661 Stiffness of right knee, not elsewhere classified: Secondary | ICD-10-CM | POA: Diagnosis not present

## 2017-10-18 DIAGNOSIS — Z96651 Presence of right artificial knee joint: Secondary | ICD-10-CM | POA: Diagnosis not present

## 2017-10-22 DIAGNOSIS — M25661 Stiffness of right knee, not elsewhere classified: Secondary | ICD-10-CM | POA: Diagnosis not present

## 2017-10-22 DIAGNOSIS — Z96651 Presence of right artificial knee joint: Secondary | ICD-10-CM | POA: Diagnosis not present

## 2017-10-22 DIAGNOSIS — M25561 Pain in right knee: Secondary | ICD-10-CM | POA: Diagnosis not present

## 2017-10-24 DIAGNOSIS — M25561 Pain in right knee: Secondary | ICD-10-CM | POA: Diagnosis not present

## 2017-10-24 DIAGNOSIS — Z96651 Presence of right artificial knee joint: Secondary | ICD-10-CM | POA: Diagnosis not present

## 2017-10-24 DIAGNOSIS — M25661 Stiffness of right knee, not elsewhere classified: Secondary | ICD-10-CM | POA: Diagnosis not present

## 2017-10-25 DIAGNOSIS — M25561 Pain in right knee: Secondary | ICD-10-CM | POA: Diagnosis not present

## 2017-10-25 DIAGNOSIS — M25661 Stiffness of right knee, not elsewhere classified: Secondary | ICD-10-CM | POA: Diagnosis not present

## 2017-10-25 DIAGNOSIS — Z96651 Presence of right artificial knee joint: Secondary | ICD-10-CM | POA: Diagnosis not present

## 2017-10-27 DIAGNOSIS — M25661 Stiffness of right knee, not elsewhere classified: Secondary | ICD-10-CM | POA: Diagnosis not present

## 2017-10-27 DIAGNOSIS — Z96651 Presence of right artificial knee joint: Secondary | ICD-10-CM | POA: Diagnosis not present

## 2017-10-29 DIAGNOSIS — Z96651 Presence of right artificial knee joint: Secondary | ICD-10-CM | POA: Diagnosis not present

## 2017-10-29 DIAGNOSIS — M25561 Pain in right knee: Secondary | ICD-10-CM | POA: Diagnosis not present

## 2017-10-29 DIAGNOSIS — M25661 Stiffness of right knee, not elsewhere classified: Secondary | ICD-10-CM | POA: Diagnosis not present

## 2017-10-31 DIAGNOSIS — Z96651 Presence of right artificial knee joint: Secondary | ICD-10-CM | POA: Diagnosis not present

## 2017-10-31 DIAGNOSIS — M25661 Stiffness of right knee, not elsewhere classified: Secondary | ICD-10-CM | POA: Diagnosis not present

## 2017-10-31 DIAGNOSIS — M25561 Pain in right knee: Secondary | ICD-10-CM | POA: Diagnosis not present

## 2017-10-31 DIAGNOSIS — M25462 Effusion, left knee: Secondary | ICD-10-CM | POA: Diagnosis not present

## 2017-11-05 DIAGNOSIS — Z96651 Presence of right artificial knee joint: Secondary | ICD-10-CM | POA: Diagnosis not present

## 2017-11-05 DIAGNOSIS — M25661 Stiffness of right knee, not elsewhere classified: Secondary | ICD-10-CM | POA: Diagnosis not present

## 2017-11-05 DIAGNOSIS — M25561 Pain in right knee: Secondary | ICD-10-CM | POA: Diagnosis not present

## 2017-11-08 DIAGNOSIS — M25661 Stiffness of right knee, not elsewhere classified: Secondary | ICD-10-CM | POA: Diagnosis not present

## 2017-11-08 DIAGNOSIS — M25561 Pain in right knee: Secondary | ICD-10-CM | POA: Diagnosis not present

## 2017-11-08 DIAGNOSIS — Z96651 Presence of right artificial knee joint: Secondary | ICD-10-CM | POA: Diagnosis not present

## 2017-11-12 DIAGNOSIS — M25561 Pain in right knee: Secondary | ICD-10-CM | POA: Diagnosis not present

## 2017-11-12 DIAGNOSIS — M25661 Stiffness of right knee, not elsewhere classified: Secondary | ICD-10-CM | POA: Diagnosis not present

## 2017-11-12 DIAGNOSIS — Z96651 Presence of right artificial knee joint: Secondary | ICD-10-CM | POA: Diagnosis not present

## 2017-11-13 DIAGNOSIS — E7800 Pure hypercholesterolemia, unspecified: Secondary | ICD-10-CM | POA: Diagnosis not present

## 2017-11-13 DIAGNOSIS — Z6828 Body mass index (BMI) 28.0-28.9, adult: Secondary | ICD-10-CM | POA: Diagnosis not present

## 2017-11-13 DIAGNOSIS — E1169 Type 2 diabetes mellitus with other specified complication: Secondary | ICD-10-CM | POA: Diagnosis not present

## 2017-11-13 DIAGNOSIS — M25562 Pain in left knee: Secondary | ICD-10-CM | POA: Diagnosis not present

## 2017-11-13 DIAGNOSIS — M4726 Other spondylosis with radiculopathy, lumbar region: Secondary | ICD-10-CM | POA: Diagnosis not present

## 2017-11-13 DIAGNOSIS — I10 Essential (primary) hypertension: Secondary | ICD-10-CM | POA: Diagnosis not present

## 2017-11-13 DIAGNOSIS — M25661 Stiffness of right knee, not elsewhere classified: Secondary | ICD-10-CM | POA: Diagnosis not present

## 2017-11-19 DIAGNOSIS — Z96651 Presence of right artificial knee joint: Secondary | ICD-10-CM | POA: Diagnosis not present

## 2017-11-19 DIAGNOSIS — M25561 Pain in right knee: Secondary | ICD-10-CM | POA: Diagnosis not present

## 2017-11-19 DIAGNOSIS — M25661 Stiffness of right knee, not elsewhere classified: Secondary | ICD-10-CM | POA: Diagnosis not present

## 2017-11-21 DIAGNOSIS — M25661 Stiffness of right knee, not elsewhere classified: Secondary | ICD-10-CM | POA: Diagnosis not present

## 2017-11-21 DIAGNOSIS — Z96651 Presence of right artificial knee joint: Secondary | ICD-10-CM | POA: Diagnosis not present

## 2017-11-21 DIAGNOSIS — M25561 Pain in right knee: Secondary | ICD-10-CM | POA: Diagnosis not present

## 2017-11-22 DIAGNOSIS — M25661 Stiffness of right knee, not elsewhere classified: Secondary | ICD-10-CM | POA: Diagnosis not present

## 2017-11-22 DIAGNOSIS — M25561 Pain in right knee: Secondary | ICD-10-CM | POA: Diagnosis not present

## 2017-11-22 DIAGNOSIS — Z96651 Presence of right artificial knee joint: Secondary | ICD-10-CM | POA: Diagnosis not present

## 2017-11-29 DIAGNOSIS — R69 Illness, unspecified: Secondary | ICD-10-CM | POA: Diagnosis not present

## 2017-12-03 DIAGNOSIS — M25561 Pain in right knee: Secondary | ICD-10-CM | POA: Diagnosis not present

## 2017-12-03 DIAGNOSIS — M25562 Pain in left knee: Secondary | ICD-10-CM | POA: Diagnosis not present

## 2017-12-03 DIAGNOSIS — Z96651 Presence of right artificial knee joint: Secondary | ICD-10-CM | POA: Diagnosis not present

## 2017-12-03 DIAGNOSIS — M1712 Unilateral primary osteoarthritis, left knee: Secondary | ICD-10-CM | POA: Diagnosis not present

## 2017-12-05 DIAGNOSIS — R69 Illness, unspecified: Secondary | ICD-10-CM | POA: Diagnosis not present

## 2017-12-28 DIAGNOSIS — R69 Illness, unspecified: Secondary | ICD-10-CM | POA: Diagnosis not present

## 2018-01-02 DIAGNOSIS — M1712 Unilateral primary osteoarthritis, left knee: Secondary | ICD-10-CM | POA: Diagnosis not present

## 2018-01-02 DIAGNOSIS — M6149 Other calcification of muscle, multiple sites: Secondary | ICD-10-CM | POA: Diagnosis not present

## 2018-01-02 DIAGNOSIS — M25561 Pain in right knee: Secondary | ICD-10-CM | POA: Diagnosis not present

## 2018-01-02 DIAGNOSIS — M9689 Other intraoperative and postprocedural complications and disorders of the musculoskeletal system: Secondary | ICD-10-CM | POA: Diagnosis not present

## 2018-02-13 DIAGNOSIS — M25561 Pain in right knee: Secondary | ICD-10-CM | POA: Diagnosis not present

## 2018-02-28 DIAGNOSIS — R69 Illness, unspecified: Secondary | ICD-10-CM | POA: Diagnosis not present

## 2018-03-18 DIAGNOSIS — M1712 Unilateral primary osteoarthritis, left knee: Secondary | ICD-10-CM | POA: Diagnosis not present

## 2018-03-18 DIAGNOSIS — Z96651 Presence of right artificial knee joint: Secondary | ICD-10-CM | POA: Diagnosis not present

## 2018-03-18 DIAGNOSIS — M25561 Pain in right knee: Secondary | ICD-10-CM | POA: Diagnosis not present

## 2018-03-18 DIAGNOSIS — Z09 Encounter for follow-up examination after completed treatment for conditions other than malignant neoplasm: Secondary | ICD-10-CM | POA: Diagnosis not present

## 2018-04-17 DIAGNOSIS — R69 Illness, unspecified: Secondary | ICD-10-CM | POA: Diagnosis not present

## 2018-04-24 DIAGNOSIS — M25561 Pain in right knee: Secondary | ICD-10-CM | POA: Diagnosis not present

## 2018-04-24 DIAGNOSIS — G5781 Other specified mononeuropathies of right lower limb: Secondary | ICD-10-CM | POA: Diagnosis not present

## 2018-04-24 DIAGNOSIS — M1712 Unilateral primary osteoarthritis, left knee: Secondary | ICD-10-CM | POA: Diagnosis not present

## 2018-04-24 DIAGNOSIS — Z96651 Presence of right artificial knee joint: Secondary | ICD-10-CM | POA: Diagnosis not present

## 2018-05-15 DIAGNOSIS — M25561 Pain in right knee: Secondary | ICD-10-CM | POA: Diagnosis not present

## 2018-05-19 DIAGNOSIS — M25561 Pain in right knee: Secondary | ICD-10-CM | POA: Diagnosis not present

## 2018-05-21 DIAGNOSIS — M179 Osteoarthritis of knee, unspecified: Secondary | ICD-10-CM | POA: Diagnosis not present

## 2018-05-21 DIAGNOSIS — K219 Gastro-esophageal reflux disease without esophagitis: Secondary | ICD-10-CM | POA: Diagnosis not present

## 2018-05-21 DIAGNOSIS — Z1389 Encounter for screening for other disorder: Secondary | ICD-10-CM | POA: Diagnosis not present

## 2018-05-21 DIAGNOSIS — Z0000 Encounter for general adult medical examination without abnormal findings: Secondary | ICD-10-CM | POA: Diagnosis not present

## 2018-05-21 DIAGNOSIS — E7800 Pure hypercholesterolemia, unspecified: Secondary | ICD-10-CM | POA: Diagnosis not present

## 2018-05-21 DIAGNOSIS — E1169 Type 2 diabetes mellitus with other specified complication: Secondary | ICD-10-CM | POA: Diagnosis not present

## 2018-05-21 DIAGNOSIS — Z1211 Encounter for screening for malignant neoplasm of colon: Secondary | ICD-10-CM | POA: Diagnosis not present

## 2018-05-21 DIAGNOSIS — Z6829 Body mass index (BMI) 29.0-29.9, adult: Secondary | ICD-10-CM | POA: Diagnosis not present

## 2018-05-21 DIAGNOSIS — I10 Essential (primary) hypertension: Secondary | ICD-10-CM | POA: Diagnosis not present

## 2018-05-22 DIAGNOSIS — Z1211 Encounter for screening for malignant neoplasm of colon: Secondary | ICD-10-CM | POA: Diagnosis not present

## 2018-05-22 DIAGNOSIS — M25561 Pain in right knee: Secondary | ICD-10-CM | POA: Diagnosis not present

## 2018-05-23 DIAGNOSIS — M25561 Pain in right knee: Secondary | ICD-10-CM | POA: Diagnosis not present

## 2018-05-27 DIAGNOSIS — M25561 Pain in right knee: Secondary | ICD-10-CM | POA: Diagnosis not present

## 2018-05-29 DIAGNOSIS — M25561 Pain in right knee: Secondary | ICD-10-CM | POA: Diagnosis not present

## 2018-05-30 DIAGNOSIS — M25561 Pain in right knee: Secondary | ICD-10-CM | POA: Diagnosis not present

## 2018-06-02 DIAGNOSIS — R69 Illness, unspecified: Secondary | ICD-10-CM | POA: Diagnosis not present

## 2018-06-03 DIAGNOSIS — M25561 Pain in right knee: Secondary | ICD-10-CM | POA: Diagnosis not present

## 2018-06-05 DIAGNOSIS — M25561 Pain in right knee: Secondary | ICD-10-CM | POA: Diagnosis not present

## 2018-06-10 DIAGNOSIS — M25561 Pain in right knee: Secondary | ICD-10-CM | POA: Diagnosis not present

## 2018-06-12 DIAGNOSIS — M25561 Pain in right knee: Secondary | ICD-10-CM | POA: Diagnosis not present

## 2018-06-13 DIAGNOSIS — M25562 Pain in left knee: Secondary | ICD-10-CM | POA: Diagnosis not present

## 2018-06-13 DIAGNOSIS — T8484XD Pain due to internal orthopedic prosthetic devices, implants and grafts, subsequent encounter: Secondary | ICD-10-CM | POA: Diagnosis not present

## 2018-06-13 DIAGNOSIS — M1712 Unilateral primary osteoarthritis, left knee: Secondary | ICD-10-CM | POA: Diagnosis not present

## 2018-06-13 DIAGNOSIS — M7671 Peroneal tendinitis, right leg: Secondary | ICD-10-CM | POA: Diagnosis not present

## 2018-06-13 DIAGNOSIS — M25561 Pain in right knee: Secondary | ICD-10-CM | POA: Diagnosis not present

## 2018-06-17 DIAGNOSIS — J069 Acute upper respiratory infection, unspecified: Secondary | ICD-10-CM | POA: Diagnosis not present

## 2018-06-17 DIAGNOSIS — Z6830 Body mass index (BMI) 30.0-30.9, adult: Secondary | ICD-10-CM | POA: Diagnosis not present

## 2018-06-20 DIAGNOSIS — M25561 Pain in right knee: Secondary | ICD-10-CM | POA: Diagnosis not present

## 2018-06-24 DIAGNOSIS — M25561 Pain in right knee: Secondary | ICD-10-CM | POA: Diagnosis not present

## 2018-06-26 DIAGNOSIS — M25561 Pain in right knee: Secondary | ICD-10-CM | POA: Diagnosis not present

## 2018-07-01 DIAGNOSIS — M25561 Pain in right knee: Secondary | ICD-10-CM | POA: Diagnosis not present

## 2018-07-02 DIAGNOSIS — R69 Illness, unspecified: Secondary | ICD-10-CM | POA: Diagnosis not present

## 2018-07-03 DIAGNOSIS — M25561 Pain in right knee: Secondary | ICD-10-CM | POA: Diagnosis not present

## 2018-07-08 DIAGNOSIS — M25561 Pain in right knee: Secondary | ICD-10-CM | POA: Diagnosis not present

## 2018-07-10 DIAGNOSIS — M1712 Unilateral primary osteoarthritis, left knee: Secondary | ICD-10-CM | POA: Diagnosis not present

## 2018-07-10 DIAGNOSIS — M25562 Pain in left knee: Secondary | ICD-10-CM | POA: Diagnosis not present

## 2018-07-10 DIAGNOSIS — M25561 Pain in right knee: Secondary | ICD-10-CM | POA: Diagnosis not present

## 2018-07-11 DIAGNOSIS — M25561 Pain in right knee: Secondary | ICD-10-CM | POA: Diagnosis not present

## 2018-07-15 DIAGNOSIS — M25561 Pain in right knee: Secondary | ICD-10-CM | POA: Diagnosis not present

## 2018-07-21 DIAGNOSIS — M25562 Pain in left knee: Secondary | ICD-10-CM | POA: Diagnosis not present

## 2018-07-21 DIAGNOSIS — M25561 Pain in right knee: Secondary | ICD-10-CM | POA: Diagnosis not present

## 2018-07-23 DIAGNOSIS — M13812 Other specified arthritis, left shoulder: Secondary | ICD-10-CM | POA: Diagnosis not present

## 2018-07-23 DIAGNOSIS — M25512 Pain in left shoulder: Secondary | ICD-10-CM | POA: Diagnosis not present

## 2018-08-19 DIAGNOSIS — M25562 Pain in left knee: Secondary | ICD-10-CM | POA: Diagnosis not present

## 2018-08-19 DIAGNOSIS — M25561 Pain in right knee: Secondary | ICD-10-CM | POA: Diagnosis not present

## 2018-08-20 DIAGNOSIS — M25561 Pain in right knee: Secondary | ICD-10-CM | POA: Diagnosis not present

## 2018-08-20 DIAGNOSIS — M25562 Pain in left knee: Secondary | ICD-10-CM | POA: Diagnosis not present

## 2018-08-27 DIAGNOSIS — M25561 Pain in right knee: Secondary | ICD-10-CM | POA: Diagnosis not present

## 2018-08-27 DIAGNOSIS — M25562 Pain in left knee: Secondary | ICD-10-CM | POA: Diagnosis not present

## 2018-08-28 DIAGNOSIS — M25562 Pain in left knee: Secondary | ICD-10-CM | POA: Diagnosis not present

## 2018-08-28 DIAGNOSIS — M25561 Pain in right knee: Secondary | ICD-10-CM | POA: Diagnosis not present

## 2018-09-02 DIAGNOSIS — M25562 Pain in left knee: Secondary | ICD-10-CM | POA: Diagnosis not present

## 2018-09-02 DIAGNOSIS — M25561 Pain in right knee: Secondary | ICD-10-CM | POA: Diagnosis not present

## 2018-09-04 DIAGNOSIS — M25562 Pain in left knee: Secondary | ICD-10-CM | POA: Diagnosis not present

## 2018-09-04 DIAGNOSIS — M25561 Pain in right knee: Secondary | ICD-10-CM | POA: Diagnosis not present

## 2018-09-08 DIAGNOSIS — M25561 Pain in right knee: Secondary | ICD-10-CM | POA: Diagnosis not present

## 2018-09-08 DIAGNOSIS — M25562 Pain in left knee: Secondary | ICD-10-CM | POA: Diagnosis not present

## 2018-09-11 DIAGNOSIS — M25561 Pain in right knee: Secondary | ICD-10-CM | POA: Diagnosis not present

## 2018-09-11 DIAGNOSIS — M25562 Pain in left knee: Secondary | ICD-10-CM | POA: Diagnosis not present

## 2018-09-15 DIAGNOSIS — M25562 Pain in left knee: Secondary | ICD-10-CM | POA: Diagnosis not present

## 2018-09-15 DIAGNOSIS — M25561 Pain in right knee: Secondary | ICD-10-CM | POA: Diagnosis not present

## 2018-09-17 DIAGNOSIS — M25562 Pain in left knee: Secondary | ICD-10-CM | POA: Diagnosis not present

## 2018-09-17 DIAGNOSIS — M25561 Pain in right knee: Secondary | ICD-10-CM | POA: Diagnosis not present

## 2018-09-18 ENCOUNTER — Other Ambulatory Visit: Payer: Self-pay | Admitting: Orthopedic Surgery

## 2018-09-18 ENCOUNTER — Other Ambulatory Visit (HOSPITAL_COMMUNITY): Payer: Self-pay | Admitting: Orthopedic Surgery

## 2018-09-18 DIAGNOSIS — M1712 Unilateral primary osteoarthritis, left knee: Secondary | ICD-10-CM | POA: Diagnosis not present

## 2018-09-18 DIAGNOSIS — Z96651 Presence of right artificial knee joint: Secondary | ICD-10-CM | POA: Diagnosis not present

## 2018-09-18 DIAGNOSIS — M25561 Pain in right knee: Secondary | ICD-10-CM | POA: Diagnosis not present

## 2018-09-22 DIAGNOSIS — M25562 Pain in left knee: Secondary | ICD-10-CM | POA: Diagnosis not present

## 2018-09-22 DIAGNOSIS — M25561 Pain in right knee: Secondary | ICD-10-CM | POA: Diagnosis not present

## 2018-09-23 ENCOUNTER — Ambulatory Visit (HOSPITAL_COMMUNITY): Payer: Medicare HMO

## 2018-09-23 ENCOUNTER — Other Ambulatory Visit (HOSPITAL_COMMUNITY): Payer: Medicare HMO

## 2018-09-24 DIAGNOSIS — M25562 Pain in left knee: Secondary | ICD-10-CM | POA: Diagnosis not present

## 2018-09-24 DIAGNOSIS — M25561 Pain in right knee: Secondary | ICD-10-CM | POA: Diagnosis not present

## 2018-09-25 DIAGNOSIS — R69 Illness, unspecified: Secondary | ICD-10-CM | POA: Diagnosis not present

## 2018-10-01 DIAGNOSIS — M25561 Pain in right knee: Secondary | ICD-10-CM | POA: Diagnosis not present

## 2018-10-01 DIAGNOSIS — M25562 Pain in left knee: Secondary | ICD-10-CM | POA: Diagnosis not present

## 2018-10-07 ENCOUNTER — Encounter (HOSPITAL_COMMUNITY): Payer: Self-pay

## 2018-10-07 ENCOUNTER — Ambulatory Visit (HOSPITAL_COMMUNITY)
Admission: RE | Admit: 2018-10-07 | Discharge: 2018-10-07 | Disposition: A | Discharge status: Home / Self Care | Payer: Medicare HMO | Source: Ambulatory Visit | Attending: Orthopedic Surgery | Admitting: Orthopedic Surgery

## 2018-10-07 ENCOUNTER — Other Ambulatory Visit: Payer: Self-pay

## 2018-10-07 ENCOUNTER — Encounter (HOSPITAL_COMMUNITY): Payer: Medicare HMO

## 2018-10-07 ENCOUNTER — Encounter (HOSPITAL_COMMUNITY)
Admission: RE | Admit: 2018-10-07 | Discharge: 2018-10-07 | Disposition: A | Discharge status: Home / Self Care | Payer: Medicare HMO | Source: Ambulatory Visit | Attending: Orthopedic Surgery | Admitting: Orthopedic Surgery

## 2018-10-07 DIAGNOSIS — Z96651 Presence of right artificial knee joint: Secondary | ICD-10-CM | POA: Insufficient documentation

## 2018-10-07 DIAGNOSIS — M25561 Pain in right knee: Secondary | ICD-10-CM | POA: Diagnosis not present

## 2018-10-07 MED ORDER — TECHNETIUM TC 99M MEDRONATE IV KIT
21.0000 | PACK | Freq: Once | INTRAVENOUS | Status: AC | PRN
  Administered 2018-10-07: 21 via INTRAVENOUS

## 2018-10-10 DIAGNOSIS — M25561 Pain in right knee: Secondary | ICD-10-CM | POA: Diagnosis not present

## 2018-10-27 DIAGNOSIS — M25561 Pain in right knee: Secondary | ICD-10-CM | POA: Diagnosis not present

## 2018-10-27 DIAGNOSIS — M25562 Pain in left knee: Secondary | ICD-10-CM | POA: Diagnosis not present

## 2018-10-30 DIAGNOSIS — M25562 Pain in left knee: Secondary | ICD-10-CM | POA: Diagnosis not present

## 2018-10-30 DIAGNOSIS — M25561 Pain in right knee: Secondary | ICD-10-CM | POA: Diagnosis not present

## 2018-11-04 DIAGNOSIS — M25561 Pain in right knee: Secondary | ICD-10-CM | POA: Diagnosis not present

## 2018-11-04 DIAGNOSIS — M25562 Pain in left knee: Secondary | ICD-10-CM | POA: Diagnosis not present

## 2018-11-06 DIAGNOSIS — M25561 Pain in right knee: Secondary | ICD-10-CM | POA: Diagnosis not present

## 2018-11-06 DIAGNOSIS — M25562 Pain in left knee: Secondary | ICD-10-CM | POA: Diagnosis not present

## 2018-11-10 DIAGNOSIS — M25562 Pain in left knee: Secondary | ICD-10-CM | POA: Diagnosis not present

## 2018-11-10 DIAGNOSIS — M25561 Pain in right knee: Secondary | ICD-10-CM | POA: Diagnosis not present

## 2018-11-13 DIAGNOSIS — M25562 Pain in left knee: Secondary | ICD-10-CM | POA: Diagnosis not present

## 2018-11-13 DIAGNOSIS — M25561 Pain in right knee: Secondary | ICD-10-CM | POA: Diagnosis not present

## 2018-11-18 DIAGNOSIS — M25562 Pain in left knee: Secondary | ICD-10-CM | POA: Diagnosis not present

## 2018-11-18 DIAGNOSIS — M25561 Pain in right knee: Secondary | ICD-10-CM | POA: Diagnosis not present

## 2018-11-19 DIAGNOSIS — E7800 Pure hypercholesterolemia, unspecified: Secondary | ICD-10-CM | POA: Diagnosis not present

## 2018-11-19 DIAGNOSIS — E1169 Type 2 diabetes mellitus with other specified complication: Secondary | ICD-10-CM | POA: Diagnosis not present

## 2018-11-19 DIAGNOSIS — K219 Gastro-esophageal reflux disease without esophagitis: Secondary | ICD-10-CM | POA: Diagnosis not present

## 2018-11-19 DIAGNOSIS — M179 Osteoarthritis of knee, unspecified: Secondary | ICD-10-CM | POA: Diagnosis not present

## 2018-11-19 DIAGNOSIS — I10 Essential (primary) hypertension: Secondary | ICD-10-CM | POA: Diagnosis not present

## 2018-11-19 DIAGNOSIS — Z7984 Long term (current) use of oral hypoglycemic drugs: Secondary | ICD-10-CM | POA: Diagnosis not present

## 2018-11-20 DIAGNOSIS — M25562 Pain in left knee: Secondary | ICD-10-CM | POA: Diagnosis not present

## 2018-11-20 DIAGNOSIS — M25561 Pain in right knee: Secondary | ICD-10-CM | POA: Diagnosis not present

## 2018-11-24 DIAGNOSIS — M25561 Pain in right knee: Secondary | ICD-10-CM | POA: Diagnosis not present

## 2018-11-24 DIAGNOSIS — M25562 Pain in left knee: Secondary | ICD-10-CM | POA: Diagnosis not present

## 2018-11-26 DIAGNOSIS — M25562 Pain in left knee: Secondary | ICD-10-CM | POA: Diagnosis not present

## 2018-11-26 DIAGNOSIS — M25561 Pain in right knee: Secondary | ICD-10-CM | POA: Diagnosis not present

## 2018-11-28 DIAGNOSIS — M25562 Pain in left knee: Secondary | ICD-10-CM | POA: Diagnosis not present

## 2018-11-28 DIAGNOSIS — M25561 Pain in right knee: Secondary | ICD-10-CM | POA: Diagnosis not present

## 2018-12-02 DIAGNOSIS — M25561 Pain in right knee: Secondary | ICD-10-CM | POA: Diagnosis not present

## 2018-12-02 DIAGNOSIS — R69 Illness, unspecified: Secondary | ICD-10-CM | POA: Diagnosis not present

## 2018-12-02 DIAGNOSIS — M25562 Pain in left knee: Secondary | ICD-10-CM | POA: Diagnosis not present

## 2018-12-04 DIAGNOSIS — M25562 Pain in left knee: Secondary | ICD-10-CM | POA: Diagnosis not present

## 2018-12-04 DIAGNOSIS — M25561 Pain in right knee: Secondary | ICD-10-CM | POA: Diagnosis not present

## 2018-12-05 DIAGNOSIS — M25562 Pain in left knee: Secondary | ICD-10-CM | POA: Diagnosis not present

## 2018-12-05 DIAGNOSIS — M25561 Pain in right knee: Secondary | ICD-10-CM | POA: Diagnosis not present

## 2018-12-08 DIAGNOSIS — M25562 Pain in left knee: Secondary | ICD-10-CM | POA: Diagnosis not present

## 2018-12-08 DIAGNOSIS — M25561 Pain in right knee: Secondary | ICD-10-CM | POA: Diagnosis not present

## 2018-12-09 DIAGNOSIS — M25561 Pain in right knee: Secondary | ICD-10-CM | POA: Diagnosis not present

## 2018-12-09 DIAGNOSIS — M25562 Pain in left knee: Secondary | ICD-10-CM | POA: Diagnosis not present

## 2018-12-11 DIAGNOSIS — M25562 Pain in left knee: Secondary | ICD-10-CM | POA: Diagnosis not present

## 2018-12-11 DIAGNOSIS — M25561 Pain in right knee: Secondary | ICD-10-CM | POA: Diagnosis not present

## 2018-12-16 DIAGNOSIS — M25562 Pain in left knee: Secondary | ICD-10-CM | POA: Diagnosis not present

## 2018-12-16 DIAGNOSIS — M25561 Pain in right knee: Secondary | ICD-10-CM | POA: Diagnosis not present

## 2018-12-17 DIAGNOSIS — M25561 Pain in right knee: Secondary | ICD-10-CM | POA: Diagnosis not present

## 2018-12-17 DIAGNOSIS — M25562 Pain in left knee: Secondary | ICD-10-CM | POA: Diagnosis not present

## 2018-12-18 DIAGNOSIS — M25562 Pain in left knee: Secondary | ICD-10-CM | POA: Diagnosis not present

## 2018-12-18 DIAGNOSIS — M25561 Pain in right knee: Secondary | ICD-10-CM | POA: Diagnosis not present

## 2018-12-23 DIAGNOSIS — M25561 Pain in right knee: Secondary | ICD-10-CM | POA: Diagnosis not present

## 2018-12-23 DIAGNOSIS — M25531 Pain in right wrist: Secondary | ICD-10-CM | POA: Diagnosis not present

## 2018-12-23 DIAGNOSIS — M25562 Pain in left knee: Secondary | ICD-10-CM | POA: Diagnosis not present

## 2018-12-24 DIAGNOSIS — R262 Difficulty in walking, not elsewhere classified: Secondary | ICD-10-CM | POA: Diagnosis not present

## 2018-12-24 DIAGNOSIS — M25561 Pain in right knee: Secondary | ICD-10-CM | POA: Diagnosis not present

## 2018-12-24 DIAGNOSIS — M25562 Pain in left knee: Secondary | ICD-10-CM | POA: Diagnosis not present

## 2018-12-25 DIAGNOSIS — M25562 Pain in left knee: Secondary | ICD-10-CM | POA: Diagnosis not present

## 2018-12-25 DIAGNOSIS — M25561 Pain in right knee: Secondary | ICD-10-CM | POA: Diagnosis not present

## 2018-12-29 DIAGNOSIS — M25561 Pain in right knee: Secondary | ICD-10-CM | POA: Diagnosis not present

## 2018-12-29 DIAGNOSIS — M25562 Pain in left knee: Secondary | ICD-10-CM | POA: Diagnosis not present

## 2018-12-30 DIAGNOSIS — M25562 Pain in left knee: Secondary | ICD-10-CM | POA: Diagnosis not present

## 2018-12-30 DIAGNOSIS — M25561 Pain in right knee: Secondary | ICD-10-CM | POA: Diagnosis not present

## 2018-12-31 DIAGNOSIS — M25562 Pain in left knee: Secondary | ICD-10-CM | POA: Diagnosis not present

## 2018-12-31 DIAGNOSIS — M25561 Pain in right knee: Secondary | ICD-10-CM | POA: Diagnosis not present

## 2019-01-06 DIAGNOSIS — M25561 Pain in right knee: Secondary | ICD-10-CM | POA: Diagnosis not present

## 2019-01-06 DIAGNOSIS — M25562 Pain in left knee: Secondary | ICD-10-CM | POA: Diagnosis not present

## 2019-01-07 DIAGNOSIS — M25561 Pain in right knee: Secondary | ICD-10-CM | POA: Diagnosis not present

## 2019-01-07 DIAGNOSIS — M25562 Pain in left knee: Secondary | ICD-10-CM | POA: Diagnosis not present

## 2019-01-08 DIAGNOSIS — M25561 Pain in right knee: Secondary | ICD-10-CM | POA: Diagnosis not present

## 2019-01-08 DIAGNOSIS — M25562 Pain in left knee: Secondary | ICD-10-CM | POA: Diagnosis not present

## 2019-01-13 DIAGNOSIS — M25561 Pain in right knee: Secondary | ICD-10-CM | POA: Diagnosis not present

## 2019-01-13 DIAGNOSIS — M25562 Pain in left knee: Secondary | ICD-10-CM | POA: Diagnosis not present

## 2019-01-14 DIAGNOSIS — M25561 Pain in right knee: Secondary | ICD-10-CM | POA: Diagnosis not present

## 2019-01-14 DIAGNOSIS — M25562 Pain in left knee: Secondary | ICD-10-CM | POA: Diagnosis not present

## 2019-01-15 DIAGNOSIS — M25561 Pain in right knee: Secondary | ICD-10-CM | POA: Diagnosis not present

## 2019-01-15 DIAGNOSIS — M25562 Pain in left knee: Secondary | ICD-10-CM | POA: Diagnosis not present

## 2019-01-16 DIAGNOSIS — M25562 Pain in left knee: Secondary | ICD-10-CM | POA: Diagnosis not present

## 2019-01-16 DIAGNOSIS — Z96651 Presence of right artificial knee joint: Secondary | ICD-10-CM | POA: Diagnosis not present

## 2019-01-16 DIAGNOSIS — Z471 Aftercare following joint replacement surgery: Secondary | ICD-10-CM | POA: Diagnosis not present

## 2019-01-16 DIAGNOSIS — M1712 Unilateral primary osteoarthritis, left knee: Secondary | ICD-10-CM | POA: Diagnosis not present

## 2019-02-03 DIAGNOSIS — M25561 Pain in right knee: Secondary | ICD-10-CM | POA: Diagnosis not present

## 2019-03-02 DIAGNOSIS — R69 Illness, unspecified: Secondary | ICD-10-CM | POA: Diagnosis not present

## 2019-04-01 DIAGNOSIS — M546 Pain in thoracic spine: Secondary | ICD-10-CM | POA: Diagnosis not present

## 2019-04-01 DIAGNOSIS — Z1159 Encounter for screening for other viral diseases: Secondary | ICD-10-CM | POA: Diagnosis not present

## 2019-04-07 DIAGNOSIS — B029 Zoster without complications: Secondary | ICD-10-CM | POA: Diagnosis not present

## 2019-04-09 DIAGNOSIS — M25562 Pain in left knee: Secondary | ICD-10-CM | POA: Diagnosis not present

## 2019-05-11 DIAGNOSIS — H5203 Hypermetropia, bilateral: Secondary | ICD-10-CM | POA: Diagnosis not present

## 2019-05-29 DIAGNOSIS — R69 Illness, unspecified: Secondary | ICD-10-CM | POA: Diagnosis not present

## 2019-06-03 ENCOUNTER — Inpatient Hospital Stay: Admit: 2019-06-03 | Payer: Medicare HMO | Admitting: Orthopedic Surgery

## 2019-06-03 SURGERY — TOTAL KNEE REVISION
Anesthesia: Choice | Site: Knee | Laterality: Right

## 2019-06-12 ENCOUNTER — Ambulatory Visit: Payer: Medicare HMO | Attending: Internal Medicine

## 2019-06-12 DIAGNOSIS — Z23 Encounter for immunization: Secondary | ICD-10-CM | POA: Insufficient documentation

## 2019-06-12 NOTE — Progress Notes (Signed)
   Covid-19 Vaccination Clinic  Name:  Connie Branch    MRN: [id number] DOB: [birth year]  06/12/2019  [name] was observed post Covid-19 immunization for 15 minutes without incidence. She was provided with Vaccine Information Sheet and instruction to access the V-Safe system.   [name] was instructed to call 911 with any severe reactions post vaccine: Marland Kitchen Difficulty breathing  . Swelling of your face and throat  . A fast heartbeat  . A bad rash all over your body  . Dizziness and weakness    Immunizations Administered    Name Date Dose VIS Date Route   Pfizer COVID-19 Vaccine 06/12/2019  3:21 PM 0.3 mL 04/17/2019 Intramuscular   Manufacturer: [employer], [employer]   Lot: [id number]   NDC: [address]-5

## 2019-06-23 NOTE — H&P (Signed)
TOTAL KNEE ADMISSION H&P  Patient is being admitted for right knee polyethylene versus total knee arthroplasty revision  Subjective:  Chief Complaint: Failed right total knee arthroplasty  HPI: Connie Branch, 69 y.o. female presents for pre-operative visit in preparation for their right knee polyethylene versus total knee arthroplasty revision, which is scheduled on 07/22/2019 with Dr. Wynelle Link at [facility]. The patient has had symptoms in the right knee including pain and hyperextension which has impacted their quality of life and ability to do activities of daily living. 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 The patient has had previous arthroplasty on the right knee. The patient denies an active infection.  Patient Active Problem List   Diagnosis Date Noted  . Postoperative anemia due to acute blood loss 06/24/2017  . Primary osteoarthritis of right knee 06/21/2017  . Primary osteoarthritis of left knee 06/21/2017  . DM (diabetes mellitus), secondary, uncontrolled, with ketoacidosis ([place]) 05/23/2013  . Hypokalemia 05/22/2013  . Obesity (BMI 30-39.9) 05/22/2013  . DKA (diabetic ketoacidoses) ([place]) 05/21/2013  . Muscle spasm 05/21/2013  . Hyponatremia 05/21/2013  . ARF (acute renal failure) secondary to dehydration from osmotic diuresis 05/21/2013  . HTN (hypertension) 05/21/2013  . Hyperlipidemia 05/21/2013  . GERD (gastroesophageal reflux disease) 05/21/2013  . Infection due to trichomonas 05/21/2013    Past Medical History:  Diagnosis Date  . ARF (acute renal failure) secondary to dehydration from osmotic diuresis 05/21/2013   KIDNEY FUNCTION NORMAL NOW  . Arthritis   . Cataracts, bilateral   . DDD (degenerative disc disease)   . DKA (diabetic ketoacidoses) ([place]) 05/21/2013   TYPE 2  . GERD (gastroesophageal reflux disease)   .  Headache    SINUS  . High cholesterol   . Hypertension     Past Surgical History:  Procedure Laterality Date  . ABDOMINAL HYSTERECTOMY     COMPLETE  . OVARIAN CYST AND OVARY REMOVED  YRS AGO  . TOTAL KNEE ARTHROPLASTY Right 06/21/2017   Procedure: RIGHT TOTAL KNEE ARTHROPLASTY;  Surgeon: Dorna Leitz, MD;  Location: [facility] ORS;  Service: Orthopedics;  Laterality: Right;    Prior to Admission medications   Medication Sig Start Date End Date Taking? Authorizing Provider  acetaminophen (TYLENOL) 500 MG tablet Take 1,000 mg by mouth every 6 (six) hours as needed (for pain.).    [provider]  apixaban (ELIQUIS) 2.5 MG TABS tablet Take 1 tablet (2.5 mg total) by mouth 2 (two) times daily. Take times 3 weeks postop to decrease risk of blood clots. 06/21/17   Gary Fleet, PA-C  Ascorbic Acid (VITAMIN C) 1000 MG tablet Take 1,000 mg by mouth daily.    [provider]  Blood Glucose Monitoring Suppl (BLOOD GLUCOSE METER) kit Use as instructed 05/24/13   Rama, Venetia Maxon, MD  Calcium Carbonate-Simethicone (MAALOX MAX PO) Take 5-10 mLs by mouth 3 (three) times daily as needed (for acid reflux/heartburn.).     [provider]  docusate sodium (COLACE) 100 MG capsule Take 1 capsule (100 mg total) by mouth 2 (two) times daily. 06/21/17   Gary Fleet, PA-C  fluticasone (FLONASE) 50 MCG/ACT nasal spray Place 1-2 sprays into both nostrils daily as needed for allergies.    [provider]  losartan-hydrochlorothiazide (HYZAAR) 100-25 MG per tablet Take 1 tablet by mouth daily.    [provider]  metFORMIN (GLUCOPHAGE) 500 MG tablet Take 500-1,000 mg by mouth  daily at 6 PM. If CBG is greater than 200=2 tablets, less than 200=1 tablet (take between lunch and dinner) 04/26/17   [provider]  omeprazole (PRILOSEC) 40 MG capsule Take 40 mg by mouth daily. 05/29/17   [provider]  oxyCODONE-acetaminophen (PERCOCET/ROXICET) 5-325 MG tablet Take  1-2 tablets by mouth every 6 (six) hours as needed for severe pain. 06/21/17   Gary Fleet, PA-C  simvastatin (ZOCOR) 40 MG tablet Take 40 mg by mouth daily. 05/25/17   [provider]  tiZANidine (ZANAFLEX) 2 MG tablet Take 1 tablet (2 mg total) by mouth every 8 (eight) hours as needed for muscle spasms. 06/21/17   Gary Fleet, PA-C  Vitamin D, Ergocalciferol, (DRISDOL) 50000 units CAPS capsule Take 50,000 Units by mouth every Friday. 05/27/17   [provider]    Allergies  Allergen Reactions  . Erythromycin Anaphylaxis  . Zithromax [Azithromycin] Anaphylaxis  . Aspirin Nausea And Vomiting  . Penicillins Other (See Comments)    Childhood allergy Has patient had a PCN reaction causing immediate rash, facial/tongue/throat swelling, SOB or lightheadedness with hypotension: Unknown Has patient had a PCN reaction causing severe rash involving mucus membranes or skin necrosis: Unknown Has patient had a PCN reaction that required hospitalization:Unknown Has patient had a PCN reaction occurring within the last 10 years:No If all of the above answers are "NO", then may proceed with Cephalosporin use.     Social History   Socioeconomic History  . Marital status: Single    Spouse name: Not on file  . Number of children: 0  . Years of education: Not on file  . Highest education level: Not on file  Occupational History  . Occupation: Disabled.  Tobacco Use  . Smoking status: Former Smoker    Packs/day: 0.25    Years: 50.00    Pack years: 12.50    Types: Cigarettes    Quit date: 05/07/2013    Years since quitting: 6.1  . Smokeless tobacco: Never Used  Substance and Sexual Activity  . Alcohol use: No  . Drug use: No  . Sexual activity: Not on file  Other Topics Concern  . Not on file  Social History Narrative   Single.  Lives with family.   Social Determinants of Health   Financial Resource Strain:   . Difficulty of Paying Living Expenses: Not on file  Food  Insecurity:   . Worried About Charity fundraiser in the Last Year: Not on file  . Ran Out of Food in the Last Year: Not on file  Transportation Needs:   . Lack of Transportation (Medical): Not on file  . Lack of Transportation (Non-Medical): Not on file  Physical Activity:   . Days of Exercise per Week: Not on file  . Minutes of Exercise per Session: Not on file  Stress:   . Feeling of Stress : Not on file  Social Connections:   . Frequency of Communication with Friends and Family: Not on file  . Frequency of Social Gatherings with Friends and Family: Not on file  . Attends Religious Services: Not on file  . Active Member of Clubs or Organizations: Not on file  . Attends Archivist Meetings: Not on file  . Marital Status: Not on file  Intimate Partner Violence:   . Fear of Current or Ex-Partner: Not on file  . Emotionally Abused: Not on file  . Physically Abused: Not on file  . Sexually Abused: Not on file  Tobacco Use:   . Smoking Tobacco Use: Never Assessed  . Smokeless Tobacco Use: Unknown   Social History   Substance and Sexual Activity  Alcohol Use No    Family History  Problem Relation Age of Onset  . Cancer Brother     Review of Systems  Constitutional: Negative for chills and fever.  HENT: Negative for congestion, sore throat and tinnitus.   Eyes: Negative for double vision, photophobia and pain.  Respiratory: Negative for cough, shortness of breath and wheezing.   Cardiovascular: Negative for chest pain, palpitations and orthopnea.  Gastrointestinal: Negative for heartburn, nausea and vomiting.  Genitourinary: Negative for dysuria, frequency and urgency.  Musculoskeletal: Positive for joint pain.  Neurological: Negative for dizziness, weakness and headaches.    Objective:  Physical Exam: Well nourished and well developed.  General: Alert and oriented x3, cooperative and pleasant, no acute distress.  Head: normocephalic, atraumatic,  neck supple.  Eyes: EOMI.  Respiratory: breath sounds clear in all fields, no wheezing, rales, or rhonchi. Cardiovascular: Regular rate and rhythm, no murmurs, gallops or rubs.  Abdomen: non-tender to palpation and soft, normoactive bowel sounds. Musculoskeletal:  Right Knee Exam: No effusion. Significant amount of hyperextension of approximately 15 degrees when she is standing. She can flex down to 120 degrees. She has varus/valgus laxity in extension, but not in flexion. Slight AP laxity in flexion.  Calves soft and nontender. Motor function intact in LE. Strength 5/5 LE bilaterally. Neuro: Distal pulses 2+. Sensation to light touch intact in LE.  Vital signs in last 24 hours: Blood pressure: 148/88 mmHg Pulse: 60 bpm  Imaging Review Radiographs: AP and lateral XR of the right knee shows the prosthesis in good position with no periprosthetic abnormalities.  Bone scan of the right knee showed only increased uptake at the [facility] origin.  Assessment/Plan:  Failed right total knee arthroplasty  The patient history, physical examination, clinical judgment of the provider and imaging studies are consistent with failed right total knee arthroplasty and polyethylene versus total knee revision is deemed medically necessary. The treatment options including medical management, injection therapy, arthroscopy and arthroplasty were discussed at length. The risks and benefits of total knee arthroplasty were presented and reviewed. The risks due to aseptic loosening, infection, stiffness, patella tracking problems, thromboembolic complications and other imponderables were discussed. The patient acknowledged the explanation, agreed to proceed with the plan and consent was signed. Patient is being admitted for inpatient treatment for surgery, pain control, PT, OT, prophylactic antibiotics, VTE prophylaxis, progressive ambulation and ADLs and discharge planning. The patient is planning to be discharged  home.  Anticipated LOS equal to or greater than 2 midnights due to - Age 64 and older with one or more of the following:  - Obesity  - Expected need for hospital services (PT, OT, Nursing) required for safe  discharge  - Anticipated need for postoperative skilled nursing care or inpatient rehab  - Active co-morbidities: Diabetes OR   - Unanticipated findings during/Post Surgery: None  - Patient is a high risk of re-admission due to: None  Therapy Plans: Outpatient therapy at [facility] Disposition: Home with nephew Planned DVT Prophylaxis: Xarelto 10 mg daily (allergy to ASA) DME Needed: Gilford Rile PCP: Kathyrn Lass, MD (appointment on 2/22) TXA: IV Allergies: Oxycodone ("didn't tolerate"), aspirin (abdominal pain), PCN (childhood allergy), erythromycin (anaphylaxis) Anesthesia Concerns: None BMI: 29 Last HgbA1c: "Around 5.1%" - will recheck with preop labs  Other: Pt requests intra-operative cortisone injection in the left knee Pt states she does  not want oxycodone, has tolerated tramadol well in the past Would prefer to take tylenol and tramadol  - Patient was instructed on what medications to stop prior to surgery. - Follow-up visit in 2 weeks with Dr. Wynelle Link - Begin physical therapy following surgery - Pre-operative lab work as pre-surgical testing - Prescriptions will be provided in hospital at time of discharge  Theresa Duty, PA-C Orthopedic Surgery [facility] [place]

## 2019-06-30 ENCOUNTER — Ambulatory Visit: Payer: Medicare HMO

## 2019-07-07 ENCOUNTER — Ambulatory Visit: Payer: Medicare HMO | Attending: Internal Medicine

## 2019-07-07 DIAGNOSIS — Z23 Encounter for immunization: Secondary | ICD-10-CM | POA: Insufficient documentation

## 2019-07-07 NOTE — Progress Notes (Signed)
   Covid-19 Vaccination Clinic  Name:  Connie Branch    MRN: [id number] DOB: August 05, 1950  07/07/2019  [name] was observed post Covid-19 immunization for 30 minutes based on pre-vaccination screening without incident. She was provided with Vaccine Information Sheet and instruction to access the V-Safe system.   [name] was instructed to call 911 with any severe reactions post vaccine: Marland Kitchen Difficulty breathing  . Swelling of face and throat  . A fast heartbeat  . A bad rash all over body  . Dizziness and weakness   Immunizations Administered    Name Date Dose VIS Date Route   Pfizer COVID-19 Vaccine 07/07/2019  3:15 PM 0.3 mL 04/17/2019 Intramuscular   Manufacturer: [employer], [employer]   Lot: [id number]   NDC: [address]-5

## 2019-07-13 ENCOUNTER — Encounter (HOSPITAL_COMMUNITY): Payer: Self-pay

## 2019-07-13 NOTE — Patient Instructions (Addendum)
DUE TO COVID-19 ONLY ONE VISITOR IS ALLOWED TO COME WITH YOU AND STAY IN THE WAITING ROOM ONLY DURING PRE OP AND PROCEDURE. THE ONE VISITOR MAY VISIT WITH YOU IN YOUR PRIVATE ROOM DURING VISITING HOURS ONLY!!   COVID SWAB TESTING MUST BE COMPLETED ON:  Saturday, Sep 17, 2019 at  12:25 PM  [address], [place] -[facility] enter pre surgical testing line (Must self quarantine after testing. Follow instructions on handout.)             Your procedure is scheduled on: Wednesday, July 22, 2019   Report to [facility] Main  Entrance    Report to admitting at 6:30 AM   Call this number if you have problems the morning of surgery [phone]   Do not eat food :After Midnight.   May have liquids until 5:30 AM day of surgery   CLEAR LIQUID DIET  Foods Allowed                                                                     Foods Excluded  Water, Black Coffee and tea, regular and decaf                             liquids that you cannot  Plain Jell-O in any flavor  (No red)                                           see through such as: Fruit ices (not with fruit pulp)                                     milk, soups, orange juice  Iced Popsicles (No red)                                    All solid food Carbonated beverages, regular and diet                                    Apple juices Sports drinks like Gatorade (No red) Lightly seasoned clear broth or consume(fat free) Sugar, honey syrup  Sample Menu Breakfast                                Lunch                                     Supper Cranberry juice                    Beef broth                            Chicken broth  Jell-O                                     Grape juice                           Apple juice Coffee or tea                        Jell-O                                      Popsicle                                                Coffee or tea                        Coffee  or tea   Complete one G2 drink the morning of surgery at 5:30 AM the day of surgery.   Oral Hygiene is also important to reduce your risk of infection.                                    Remember - BRUSH YOUR TEETH THE MORNING OF SURGERY WITH YOUR REGULAR TOOTHPASTE   Do NOT smoke after Midnight   Take these medicines the morning of surgery with A SIP OF WATER: Omeprazole, Simvastatin   DO NOT TAKE ANY DIABETIC MEDICATIONS DAY OF YOUR SURGERY                               You may not have any metal on your body including hair pins, jewelry, and body piercings             Do not wear make-up, lotions, powders, perfumes/cologne, or deodorant             Do not wear nail polish.  Do not shave  48 hours prior to surgery.              Do not bring valuables to the hospital. [place] IS NOT             RESPONSIBLE   FOR VALUABLES.   Contacts, dentures or bridgework may not be worn into surgery.   Bring small overnight bag day of surgery.    Patients discharged the day of surgery will not be allowed to drive home.   Name and phone number of your driver:   Special Instructions: Bring a copy of your healthcare power of attorney and living will documents         the day of surgery if you haven't scanned them in before.              Please read over the following fact sheets you were given:  How to Manage Your Diabetes Before and After Surgery  Why is it important to control my blood sugar before and after surgery? . Improving blood sugar levels before and after surgery helps healing and can limit  problems. . A way of improving blood sugar control is eating a healthy diet by: o  Eating less sugar and carbohydrates o  Increasing activity/exercise o  Talking with your doctor about reaching your blood sugar goals . High blood sugars (greater than 180 mg/dL) can raise your risk of infections and slow your recovery, so you will need to focus on controlling your diabetes during the weeks  before surgery. . Make sure that the doctor who takes care of your diabetes knows about your planned surgery including the date and location.  How do I manage my blood sugar before surgery? . Check your blood sugar at least 4 times a day, starting 2 days before surgery, to make sure that the level is not too high or low. o Check your blood sugar the morning of your surgery when you wake up and every 2 hours until you get to the Short Stay unit. . If your blood sugar is less than 70 mg/dL, you will need to treat for low blood sugar: o Do not take insulin. o Treat a low blood sugar (less than 70 mg/dL) with  cup of clear juice (cranberry or apple), 4 glucose tablets, OR glucose gel. o Recheck blood sugar in 15 minutes after treatment (to make sure it is greater than 70 mg/dL). If your blood sugar is not greater than 70 mg/dL on recheck, call [phone] for further instructions. . Report your blood sugar to the short stay nurse when you get to Short Stay.  . If you are admitted to the hospital after surgery: o Your blood sugar will be checked by the staff and you will probably be given insulin after surgery (instead of oral diabetes medicines) to make sure you have good blood sugar levels. o The goal for blood sugar control after surgery is 80-180 mg/dL.   WHAT DO I DO ABOUT MY DIABETES MEDICATION?  Marland Kitchen Do not take oral diabetes medicines (pills) the morning of surgery.  Reviewed and Endorsed by [facility] Patient Education Committee, August 2015  [facility] - Preparing for Surgery Before surgery, you can play an important role.  Because skin is not sterile, your skin needs to be as free of germs as possible.  You can reduce the number of germs on your skin by washing with CHG (chlorahexidine gluconate) soap before surgery.  CHG is an antiseptic cleaner which kills germs and bonds with the skin to continue killing germs even after washing. Please DO NOT use if you have an allergy to CHG or  antibacterial soaps.  If your skin becomes reddened/irritated stop using the CHG and inform your nurse when you arrive at Short Stay. Do not shave (including legs and underarms) for at least 48 hours prior to the first CHG shower.  You may shave your face/neck.  Please follow these instructions carefully:  1.  Shower with CHG Soap the night before surgery and the  morning of surgery.  2.  If you choose to wash your hair, wash your hair first as usual with your normal  shampoo.  3.  After you shampoo, rinse your hair and body thoroughly to remove the shampoo.                             4.  Use CHG as you would any other liquid soap.  You can apply chg directly to the skin and wash.  Gently with a scrungie or clean washcloth.  5.  Apply the CHG Soap to your body ONLY FROM THE NECK DOWN.   Do   not use on face/ open                           Wound or open sores. Avoid contact with eyes, ears mouth and   genitals (private parts).                       Wash face,  Genitals (private parts) with your normal soap.             6.  Wash thoroughly, paying special attention to the area where your    surgery  will be performed.  7.  Thoroughly rinse your body with warm water from the neck down.  8.  DO NOT shower/wash with your normal soap after using and rinsing off the CHG Soap.                9.  Pat yourself dry with a clean towel.            10.  Wear clean pajamas.            11.  Place clean sheets on your bed the night of your first shower and do not  sleep with pets. Day of Surgery : Do not apply any lotions/deodorants the morning of surgery.  Please wear clean clothes to the hospital/surgery center.  FAILURE TO FOLLOW THESE INSTRUCTIONS MAY RESULT IN THE CANCELLATION OF YOUR SURGERY  PATIENT SIGNATURE_________________________________  NURSE SIGNATURE__________________________________  ________________________________________________________________________   Adam Phenix  An  incentive spirometer is a tool that can help keep your lungs clear and active. This tool measures how well you are filling your lungs with each breath. Taking long deep breaths may help reverse or decrease the chance of developing breathing (pulmonary) problems (especially infection) following:  A long period of time when you are unable to move or be active. BEFORE THE PROCEDURE   If the spirometer includes an indicator to show your best effort, your nurse or respiratory therapist will set it to a desired goal.  If possible, sit up straight or lean slightly forward. Try not to slouch.  Hold the incentive spirometer in an upright position. INSTRUCTIONS FOR USE  1. Sit on the edge of your bed if possible, or sit up as far as you can in bed or on a chair. 2. Hold the incentive spirometer in an upright position. 3. Breathe out normally. 4. Place the mouthpiece in your mouth and seal your lips tightly around it. 5. Breathe in slowly and as deeply as possible, raising the piston or the ball toward the top of the column. 6. Hold your breath for 3-5 seconds or for as long as possible. Allow the piston or ball to fall to the bottom of the column. 7. Remove the mouthpiece from your mouth and breathe out normally. 8. Rest for a few seconds and repeat Steps 1 through 7 at least 10 times every 1-2 hours when you are awake. Take your time and take a few normal breaths between deep breaths. 9. The spirometer may include an indicator to show your best effort. Use the indicator as a goal to work toward during each repetition. 10. After each set of 10 deep breaths, practice coughing to be sure your lungs are clear. If you have an incision (the cut made at the time of surgery), support  your incision when coughing by placing a pillow or rolled up towels firmly against it. Once you are able to get out of bed, walk around indoors and cough well. You may stop using the incentive spirometer when instructed by your  caregiver.  RISKS AND COMPLICATIONS  Take your time so you do not get dizzy or light-headed.  If you are in pain, you may need to take or ask for pain medication before doing incentive spirometry. It is harder to take a deep breath if you are having pain. AFTER USE  Rest and breathe slowly and easily.  It can be helpful to keep track of a log of your progress. Your caregiver can provide you with a simple table to help with this. If you are using the spirometer at home, follow these instructions: [place] IF:   You are having difficultly using the spirometer.  You have trouble using the spirometer as often as instructed.  Your pain medication is not giving enough relief while using the spirometer.  You develop fever of 100.5 F (38.1 C) or higher. SEEK IMMEDIATE MEDICAL CARE IF:   You cough up bloody sputum that had not been present before.  You develop fever of 102 F (38.9 C) or greater.  You develop worsening pain at or near the incision site. MAKE SURE YOU:   Understand these instructions.  Will watch your condition.  Will get help right away if you are not doing well or get worse. Document Released: 09/03/2006 Document Revised: 07/16/2011 Document Reviewed: 11/04/2006 Exit[employer] Patient Information 2014 ExitCare, [place].   ________________________________________________________________________  WHAT IS A BLOOD TRANSFUSION? Blood Transfusion Information  A transfusion is the replacement of blood or some of its parts. Blood is made up of multiple cells which provide different functions.  Red blood cells carry oxygen and are used for blood loss replacement.  White blood cells fight against infection.  Platelets control bleeding.  Plasma helps clot blood.  Other blood products are available for specialized needs, such as hemophilia or other clotting disorders. BEFORE THE TRANSFUSION  Who gives blood for transfusions?   Healthy volunteers who are fully  evaluated to make sure their blood is safe. This is blood bank blood. Transfusion therapy is the safest it has ever been in the practice of medicine. Before blood is taken from a donor, a complete history is taken to make sure that person has no history of diseases nor engages in risky social behavior (examples are intravenous drug use or sexual activity with multiple partners). The donor's travel history is screened to minimize risk of transmitting infections, such as malaria. The donated blood is tested for signs of infectious diseases, such as HIV and hepatitis. The blood is then tested to be sure it is compatible with you in order to minimize the chance of a transfusion reaction. If you or a relative donates blood, this is often done in anticipation of surgery and is not appropriate for emergency situations. It takes many days to process the donated blood. RISKS AND COMPLICATIONS Although transfusion therapy is very safe and saves many lives, the main dangers of transfusion include:   Getting an infectious disease.  Developing a transfusion reaction. This is an allergic reaction to something in the blood you were given. Every precaution is taken to prevent this. The decision to have a blood transfusion has been considered carefully by your caregiver before blood is given. Blood is not given unless the benefits outweigh the risks. AFTER THE TRANSFUSION  Right after receiving a blood transfusion, you will usually feel much better and more energetic. This is especially true if your red blood cells have gotten low (anemic). The transfusion raises the level of the red blood cells which carry oxygen, and this usually causes an energy increase.  The nurse administering the transfusion will monitor you carefully for complications. HOME CARE INSTRUCTIONS  No special instructions are needed after a transfusion. You may find your energy is better. Speak with your caregiver about any limitations on activity  for underlying diseases you may have. SEEK MEDICAL CARE IF:   Your condition is not improving after your transfusion.  You develop redness or irritation at the intravenous (IV) site. SEEK IMMEDIATE MEDICAL CARE IF:  Any of the following symptoms occur over the next 12 hours:  Shaking chills.  You have a temperature by mouth above 102 F (38.9 C), not controlled by medicine.  Chest, back, or muscle pain.  People around you feel you are not acting correctly or are confused.  Shortness of breath or difficulty breathing.  Dizziness and fainting.  You get a rash or develop hives.  You have a decrease in urine output.  Your urine turns a dark color or changes to pink, red, or brown. Any of the following symptoms occur over the next 10 days:  You have a temperature by mouth above 102 F (38.9 C), not controlled by medicine.  Shortness of breath.  Weakness after normal activity.  The white part of the eye turns yellow (jaundice).  You have a decrease in the amount of urine or are urinating less often.  Your urine turns a dark color or changes to pink, red, or brown. Document Released: 04/20/2000 Document Revised: 07/16/2011 Document Reviewed: 12/08/2007 [facility] Patient Information 2014 [place], [place].  _______________________________________________________________________

## 2019-07-14 ENCOUNTER — Other Ambulatory Visit: Payer: Self-pay

## 2019-07-14 ENCOUNTER — Encounter (HOSPITAL_COMMUNITY): Payer: Self-pay

## 2019-07-14 ENCOUNTER — Encounter (HOSPITAL_COMMUNITY)
Admission: RE | Admit: 2019-07-14 | Discharge: 2019-07-14 | Disposition: A | Discharge status: Home / Self Care | Payer: Medicare HMO | Source: Ambulatory Visit | Attending: Orthopedic Surgery | Admitting: Orthopedic Surgery

## 2019-07-14 HISTORY — DX: Other specified disorders of bone density and structure, unspecified site: M85.80

## 2019-07-14 HISTORY — DX: Obesity, unspecified: E66.9

## 2019-07-14 NOTE — Progress Notes (Signed)
PCP - Dr. Hyacinth Meeker last office 07/01/19 clearance in chart Cardiologist - N/A  Chest x-ray - N/A EKG - 06/29/19 in chart Stress Test - N/A ECHO - N/A Cardiac Cath - N/A  Sleep Study - N/A CPAP - N/A  Fasting Blood Sugar - does not take fasting, takes after eating, Hgb A1C 5.4 on 06/29/19 results in chart Checks Blood Sugar __1___ times a day  Blood Thinner Instructions: N/A Aspirin Instructions:N/A Last Dose:N/A  Anesthesia review: N/A  Patient denies shortness of breath, fever, cough and chest pain at PAT appointment   Patient verbalized understanding of instructions that were given to them at the PAT appointment. Patient was also instructed that they will need to review over the PAT instructions again at home before surgery.

## 2019-07-15 ENCOUNTER — Encounter (HOSPITAL_COMMUNITY)
Admission: RE | Admit: 2019-07-15 | Discharge: 2019-07-15 | Disposition: A | Discharge status: Home / Self Care | Payer: Medicare HMO | Source: Ambulatory Visit | Attending: Orthopedic Surgery | Admitting: Orthopedic Surgery

## 2019-07-15 DIAGNOSIS — Z87891 Personal history of nicotine dependence: Secondary | ICD-10-CM | POA: Insufficient documentation

## 2019-07-15 DIAGNOSIS — N179 Acute kidney failure, unspecified: Secondary | ICD-10-CM | POA: Diagnosis not present

## 2019-07-15 DIAGNOSIS — T84022A Instability of internal right knee prosthesis, initial encounter: Secondary | ICD-10-CM | POA: Diagnosis not present

## 2019-07-15 DIAGNOSIS — Z79899 Other long term (current) drug therapy: Secondary | ICD-10-CM | POA: Diagnosis not present

## 2019-07-15 DIAGNOSIS — Z7984 Long term (current) use of oral hypoglycemic drugs: Secondary | ICD-10-CM | POA: Insufficient documentation

## 2019-07-15 DIAGNOSIS — M8580 Other specified disorders of bone density and structure, unspecified site: Secondary | ICD-10-CM | POA: Diagnosis not present

## 2019-07-15 DIAGNOSIS — K219 Gastro-esophageal reflux disease without esophagitis: Secondary | ICD-10-CM | POA: Insufficient documentation

## 2019-07-15 DIAGNOSIS — E1110 Type 2 diabetes mellitus with ketoacidosis without coma: Secondary | ICD-10-CM | POA: Diagnosis not present

## 2019-07-15 DIAGNOSIS — Z01812 Encounter for preprocedural laboratory examination: Secondary | ICD-10-CM | POA: Diagnosis not present

## 2019-07-15 DIAGNOSIS — I10 Essential (primary) hypertension: Secondary | ICD-10-CM | POA: Insufficient documentation

## 2019-07-15 DIAGNOSIS — E7800 Pure hypercholesterolemia, unspecified: Secondary | ICD-10-CM | POA: Insufficient documentation

## 2019-07-15 LAB — CBC
HCT: 35.5 % — ABNORMAL LOW (ref 36.0–46.0)
Hemoglobin: 11.8 g/dL — ABNORMAL LOW (ref 12.0–15.0)
MCH: 30.5 pg (ref 26.0–34.0)
MCHC: 33.2 g/dL (ref 30.0–36.0)
MCV: 91.7 fL (ref 80.0–100.0)
Platelets: 148 10*3/uL — ABNORMAL LOW (ref 150–400)
RBC: 3.87 MIL/uL (ref 3.87–5.11)
RDW: 13.9 % (ref 11.5–15.5)
WBC: 4.9 10*3/uL (ref 4.0–10.5)
nRBC: 0 % (ref 0.0–0.2)

## 2019-07-15 LAB — PROTIME-INR
INR: 0.9 (ref 0.8–1.2)
Prothrombin Time: 12.1 seconds (ref 11.4–15.2)

## 2019-07-15 LAB — COMPREHENSIVE METABOLIC PANEL
ALT: 17 U/L (ref 0–44)
AST: 17 U/L (ref 15–41)
Albumin: 4.7 g/dL (ref 3.5–5.0)
Alkaline Phosphatase: 47 U/L (ref 38–126)
Anion gap: 10 (ref 5–15)
BUN: 18 mg/dL (ref 8–23)
CO2: 28 mmol/L (ref 22–32)
Calcium: 9.5 mg/dL (ref 8.9–10.3)
Chloride: 98 mmol/L (ref 98–111)
Creatinine, Ser: 0.87 mg/dL (ref 0.44–1.00)
GFR calc Af Amer: >60 mL/min (ref >60)
GFR calc non Af Amer: >60 mL/min (ref >60)
Glucose, Bld: 94 mg/dL (ref 70–99)
Potassium: 2.7 mmol/L — CL (ref 3.5–5.1)
Sodium: 136 mmol/L (ref 135–145)
Total Bilirubin: 0.7 mg/dL (ref 0.3–1.2)
Total Protein: 7.3 g/dL (ref 6.5–8.1)

## 2019-07-15 LAB — SURGICAL PCR SCREEN
MRSA, PCR: NEGATIVE
Staphylococcus aureus: POSITIVE — AB

## 2019-07-15 LAB — HEMOGLOBIN A1C
Hgb A1c MFr Bld: 5.1 % (ref 4.8–5.6)
Mean Plasma Glucose: 99.67 mg/dL

## 2019-07-15 LAB — APTT: aPTT: 29 seconds (ref 24–36)

## 2019-07-15 NOTE — Progress Notes (Signed)
PCR result routed to Dr. Aluisio's office for review. 

## 2019-07-15 NOTE — Progress Notes (Signed)
Critical value K+ 2.7. Jodell Cipro, PA made aware.

## 2019-07-17 NOTE — Progress Notes (Signed)
Anesthesia Chart Review   Case: [id number] Date/Time: 07/22/19 0815   Procedure: Right knee polyethylene vs total knee arthroplasty revision (Right Knee) - 14mn   Anesthesia type: Choice   Pre-op diagnosis: unstable right total knee arthroplasty   [facility] ROOM 10 / [facility]   Surgeons: AGaynelle Arabian MD      DISCUSSION:69 y.o. former smoker (12.5 pack years, quit 05/07/13) with h/o HTN, GERD, unstable right total knee arthroplasty scheduled for above procedure 07/22/19 with Dr. FGaynelle Arabian   Potassium 2.7 at PAT visit.  Surgeon and PCP made aware.  Spoke with pt, she reports potassium chloride sent to pharmacy, advised to take over the next 6 days.  Will recheck DOS.    Anticipate pt can proceed with planned procedure barring acute status change.   VS: BP (!) 159/77   Pulse 75   Temp 36.9 C (Oral)   Resp 16   Ht _0  (1.6 m)   Wt 73.5 kg   SpO2 100%   BMI 28.70 kg/m   PROVIDERS: MKathyrn Lass MD is PCP    LABS: Potassium 2.7, PCP and Surgeon made aware (all labs ordered are listed, but only abnormal results are displayed)  Labs Reviewed  SURGICAL PCR SCREEN - Abnormal; Notable for the following components:      Result Value   Staphylococcus aureus POSITIVE (*)    All other components within normal limits  CBC - Abnormal; Notable for the following components:   Hemoglobin 11.8 (*)    HCT 35.5 (*)    Platelets 148 (*)    All other components within normal limits  COMPREHENSIVE METABOLIC PANEL - Abnormal; Notable for the following components:   Potassium 2.7 (*)    All other components within normal limits  APTT  PROTIME-INR  HEMOGLOBIN A1C  TYPE AND SCREEN     IMAGES:   EKG: 06/17/2017 Rate 58 bpm  Sinus bradycardia  Moderate voltage criteria for LVH, may be normal variant   CV:  Past Medical History:  Diagnosis Date  . ARF (acute renal failure) secondary to dehydration from osmotic diuresis 05/21/2013   KIDNEY FUNCTION NORMAL NOW  . Arthritis    . Cataracts, bilateral   . DDD (degenerative disc disease)   . [place] 05/21[place]   TYPE 2  . GERD (gastroesophageal reflux disease)   . Headache    SINUS  . High cholesterol   . Hypertension   . Neuropathy 2015   feet  . Obesity   . Osteopenia     Past Surgical History:  Procedure Laterality Date  . ABDOMINAL HYSTERECTOMY     COMPLETE  . KNEE JOINT MANIPULATION Right   . OVARIAN CYST AND OVARY REMOVED  YRS AGO  . TOTAL KNEE ARTHROPLASTY Right 06/21/2017   Procedure: RIGHT TOTAL KNEE ARTHROPLASTY;  Surgeon: GDorna Leitz MD;  Location: [facility];  Service: Orthopedics;  Laterality: Right;    MEDICATIONS: . acetaminophen (TYLENOL) 500 MG tablet  . Ascorbic Acid (VITAMIN C) 1000 MG tablet  . Blood Glucose Monitoring Suppl (BLOOD GLUCOSE METER) kit  . Calcium Carbonate-Simethicone (MAALOX MAX PO)  . fluticasone (FLONASE) 50 MCG/ACT nasal spray  . hydrochlorothiazide (HYDRODIURIL) 25 MG tablet  . losartan (COZAAR) 100 MG tablet  . metFORMIN (GLUCOPHAGE) 500 MG tablet  . omeprazole (PRILOSEC) 40 MG capsule  . simvastatin (ZOCOR) 40 MG tablet   No current facility-administered medications for this encounter.    [facility] Pre[facility] Testing ([phone]/12/21  12:38 PM

## 2019-07-18 ENCOUNTER — Other Ambulatory Visit (HOSPITAL_COMMUNITY)
Admission: RE | Admit: 2019-07-18 | Discharge: 2019-07-18 | Disposition: A | Discharge status: Home / Self Care | Payer: Medicare HMO | Source: Ambulatory Visit | Attending: Orthopedic Surgery | Admitting: Orthopedic Surgery

## 2019-07-18 DIAGNOSIS — Z01812 Encounter for preprocedural laboratory examination: Secondary | ICD-10-CM | POA: Diagnosis present

## 2019-07-18 DIAGNOSIS — Z20822 Contact with and (suspected) exposure to covid-19: Secondary | ICD-10-CM | POA: Diagnosis not present

## 2019-07-18 LAB — SARS CORONAVIRUS 2 (TAT 6-24 HRS): SARS Coronavirus 2: NEGATIVE

## 2019-07-21 MED ORDER — BUPIVACAINE LIPOSOME 1.3 % IJ SUSP
20.0000 mL | Freq: Once | INTRAMUSCULAR | Status: DC
  Filled 2019-07-21: qty 20

## 2019-07-21 NOTE — Anesthesia Preprocedure Evaluation (Addendum)
Anesthesia Evaluation  Patient identified by MRN, date of birth, ID band Patient awake    Reviewed: Allergy & Precautions, NPO status , Patient's Chart, lab work & pertinent test results  Airway Mallampati: II  TM Distance: >3 FB Neck ROM: Full    Dental  (+) Dental Advisory Given, Edentulous Upper   Pulmonary neg pulmonary ROS, former smoker,    Pulmonary exam normal breath sounds clear to auscultation       Cardiovascular hypertension, Pt. on medications Normal cardiovascular exam Rhythm:Regular Rate:Normal     Neuro/Psych  Headaches, negative psych ROS   GI/Hepatic Neg liver ROS, GERD  Medicated,  Endo/Other  diabetes, Oral Hypoglycemic AgentsMorbid obesity  Renal/GU Renal disease     Musculoskeletal  (+) Arthritis , Osteoarthritis,    Abdominal   Peds  Hematology negative hematology ROS (+) anemia ,   Anesthesia Other Findings   Reproductive/Obstetrics negative OB ROS                            Anesthesia Physical  Anesthesia Plan  ASA: III  Anesthesia Plan: Spinal and Regional   Post-op Pain Management:  Regional for Post-op pain   Induction:   PONV Risk Score and Plan: 2 and Treatment may vary due to age or medical condition, Propofol infusion, TIVA and Ondansetron  Airway Management Planned: Natural Airway  Additional Equipment:   Intra-op Plan:   Post-operative Plan:   Informed Consent: I have reviewed the patients History and Physical, chart, labs and discussed the procedure including the risks, benefits and alternatives for the proposed anesthesia with the patient or authorized representative who has indicated his/her understanding and acceptance.     Dental advisory given  Plan Discussed with: CRNA  Anesthesia Plan Comments: (  )       Anesthesia Quick Evaluation

## 2019-07-22 ENCOUNTER — Inpatient Hospital Stay (HOSPITAL_COMMUNITY)
Admission: RE | Admit: 2019-07-22 | Discharge: 2019-07-23 | DRG: 489 | Disposition: A | Discharge status: Home / Self Care | Payer: Medicare HMO | Attending: Orthopedic Surgery | Admitting: Orthopedic Surgery

## 2019-07-22 ENCOUNTER — Inpatient Hospital Stay (HOSPITAL_COMMUNITY): Payer: Medicare HMO | Admitting: Physician Assistant

## 2019-07-22 ENCOUNTER — Other Ambulatory Visit: Payer: Self-pay

## 2019-07-22 ENCOUNTER — Encounter (HOSPITAL_COMMUNITY)
Admission: RE | Disposition: A | Discharge status: Home / Self Care | Payer: Self-pay | Source: Home / Self Care | Attending: Orthopedic Surgery

## 2019-07-22 ENCOUNTER — Encounter (HOSPITAL_COMMUNITY): Payer: Self-pay | Admitting: Orthopedic Surgery

## 2019-07-22 DIAGNOSIS — Z886 Allergy status to analgesic agent status: Secondary | ICD-10-CM

## 2019-07-22 DIAGNOSIS — Z87891 Personal history of nicotine dependence: Secondary | ICD-10-CM

## 2019-07-22 DIAGNOSIS — Z20822 Contact with and (suspected) exposure to covid-19: Secondary | ICD-10-CM | POA: Diagnosis present

## 2019-07-22 DIAGNOSIS — K219 Gastro-esophageal reflux disease without esophagitis: Secondary | ICD-10-CM | POA: Diagnosis present

## 2019-07-22 DIAGNOSIS — Z880 Allergy status to penicillin: Secondary | ICD-10-CM

## 2019-07-22 DIAGNOSIS — Z881 Allergy status to other antibiotic agents status: Secondary | ICD-10-CM

## 2019-07-22 DIAGNOSIS — Z7902 Long term (current) use of antithrombotics/antiplatelets: Secondary | ICD-10-CM

## 2019-07-22 DIAGNOSIS — Z79899 Other long term (current) drug therapy: Secondary | ICD-10-CM

## 2019-07-22 DIAGNOSIS — Z6828 Body mass index (BMI) 28.0-28.9, adult: Secondary | ICD-10-CM | POA: Diagnosis not present

## 2019-07-22 DIAGNOSIS — Z7984 Long term (current) use of oral hypoglycemic drugs: Secondary | ICD-10-CM

## 2019-07-22 DIAGNOSIS — E669 Obesity, unspecified: Secondary | ICD-10-CM | POA: Diagnosis present

## 2019-07-22 DIAGNOSIS — T84022A Instability of internal right knee prosthesis, initial encounter: Principal | ICD-10-CM | POA: Diagnosis present

## 2019-07-22 DIAGNOSIS — I10 Essential (primary) hypertension: Secondary | ICD-10-CM | POA: Diagnosis present

## 2019-07-22 DIAGNOSIS — M170 Bilateral primary osteoarthritis of knee: Secondary | ICD-10-CM | POA: Diagnosis present

## 2019-07-22 DIAGNOSIS — Y792 Prosthetic and other implants, materials and accessory orthopedic devices associated with adverse incidents: Secondary | ICD-10-CM | POA: Diagnosis present

## 2019-07-22 DIAGNOSIS — Z90710 Acquired absence of both cervix and uterus: Secondary | ICD-10-CM

## 2019-07-22 DIAGNOSIS — E785 Hyperlipidemia, unspecified: Secondary | ICD-10-CM | POA: Diagnosis present

## 2019-07-22 DIAGNOSIS — T84012A Broken internal right knee prosthesis, initial encounter: Secondary | ICD-10-CM

## 2019-07-22 DIAGNOSIS — E119 Type 2 diabetes mellitus without complications: Secondary | ICD-10-CM | POA: Diagnosis present

## 2019-07-22 DIAGNOSIS — T84018A Broken internal joint prosthesis, other site, initial encounter: Secondary | ICD-10-CM

## 2019-07-22 DIAGNOSIS — Z96659 Presence of unspecified artificial knee joint: Secondary | ICD-10-CM

## 2019-07-22 HISTORY — PX: TOTAL KNEE REVISION: SHX996

## 2019-07-22 LAB — GLUCOSE, CAPILLARY
Glucose-Capillary: 105 mg/dL — ABNORMAL HIGH (ref 70–99)
Glucose-Capillary: 123 mg/dL — ABNORMAL HIGH (ref 70–99)

## 2019-07-22 LAB — BASIC METABOLIC PANEL
Anion gap: 10 (ref 5–15)
BUN: 28 mg/dL — ABNORMAL HIGH (ref 8–23)
CO2: 24 mmol/L (ref 22–32)
Calcium: 9.9 mg/dL (ref 8.9–10.3)
Chloride: 105 mmol/L (ref 98–111)
Creatinine, Ser: 1.02 mg/dL — ABNORMAL HIGH (ref 0.44–1.00)
GFR calc Af Amer: >60 mL/min (ref >60)
GFR calc non Af Amer: 56 mL/min — ABNORMAL LOW (ref >60)
Glucose, Bld: 109 mg/dL — ABNORMAL HIGH (ref 70–99)
Potassium: 4 mmol/L (ref 3.5–5.1)
Sodium: 139 mmol/L (ref 135–145)

## 2019-07-22 LAB — TYPE AND SCREEN
ABO/RH(D): O POS
Antibody Screen: NEGATIVE

## 2019-07-22 SURGERY — TOTAL KNEE REVISION
Anesthesia: Regional | Site: Knee | Laterality: Right

## 2019-07-22 MED ORDER — PHENYLEPHRINE HCL-NACL 10-0.9 MG/250ML-% IV SOLN
INTRAVENOUS | Status: DC | PRN
  Administered 2019-07-22: 20 ug/min via INTRAVENOUS

## 2019-07-22 MED ORDER — SODIUM CHLORIDE 0.9 % IR SOLN
Status: DC | PRN
  Administered 2019-07-22: 1000 mL

## 2019-07-22 MED ORDER — TRAMADOL HCL 50 MG PO TABS: 50.0000 mg | ORAL_TABLET | Freq: Four times a day (QID) | ORAL | Status: DC | PRN

## 2019-07-22 MED ORDER — CHLORHEXIDINE GLUCONATE 4 % EX LIQD: 60.0000 mL | Freq: Once | CUTANEOUS | Status: DC

## 2019-07-22 MED ORDER — SODIUM CHLORIDE (PF) 0.9 % IJ SOLN
INTRAMUSCULAR | Status: AC
  Filled 2019-07-22: qty 10

## 2019-07-22 MED ORDER — FENTANYL CITRATE (PF) 100 MCG/2ML IJ SOLN
INTRAMUSCULAR | Status: AC
  Filled 2019-07-22: qty 2

## 2019-07-22 MED ORDER — ACETAMINOPHEN 10 MG/ML IV SOLN: 1000.0000 mg | Freq: Four times a day (QID) | INTRAVENOUS | Status: DC

## 2019-07-22 MED ORDER — VANCOMYCIN HCL IN DEXTROSE 1-5 GM/200ML-% IV SOLN
1000.0000 mg | Freq: Once | INTRAVENOUS | Status: AC
  Administered 2019-07-22: 1000 mg via INTRAVENOUS
  Filled 2019-07-22: qty 200

## 2019-07-22 MED ORDER — POLYETHYLENE GLYCOL 3350 17 G PO PACK: 17.0000 g | PACK | Freq: Every day | ORAL | Status: DC | PRN

## 2019-07-22 MED ORDER — SODIUM CHLORIDE (PF) 0.9 % IJ SOLN
INTRAMUSCULAR | Status: AC
  Filled 2019-07-22: qty 50

## 2019-07-22 MED ORDER — ONDANSETRON HCL 4 MG/2ML IJ SOLN
INTRAMUSCULAR | Status: AC
  Filled 2019-07-22: qty 2

## 2019-07-22 MED ORDER — MEPERIDINE HCL 50 MG/ML IJ SOLN: 6.2500 mg | INTRAMUSCULAR | Status: DC | PRN

## 2019-07-22 MED ORDER — BISACODYL 10 MG RE SUPP: 10.0000 mg | Freq: Every day | RECTAL | Status: DC | PRN

## 2019-07-22 MED ORDER — LOSARTAN POTASSIUM 50 MG PO TABS
100.0000 mg | ORAL_TABLET | Freq: Every day | ORAL | Status: DC
  Administered 2019-07-23: 100 mg via ORAL
  Filled 2019-07-22: qty 2

## 2019-07-22 MED ORDER — ONDANSETRON HCL 4 MG/2ML IJ SOLN: 4.0000 mg | Freq: Four times a day (QID) | INTRAMUSCULAR | Status: DC | PRN

## 2019-07-22 MED ORDER — MIDAZOLAM HCL 2 MG/2ML IJ SOLN
INTRAMUSCULAR | Status: DC | PRN
  Administered 2019-07-22: 2 mg via INTRAVENOUS

## 2019-07-22 MED ORDER — METOCLOPRAMIDE HCL 5 MG PO TABS: 5.0000 mg | ORAL_TABLET | Freq: Three times a day (TID) | ORAL | Status: DC | PRN

## 2019-07-22 MED ORDER — ONDANSETRON HCL 4 MG PO TABS
4.0000 mg | ORAL_TABLET | Freq: Four times a day (QID) | ORAL | Status: DC | PRN
  Administered 2019-07-22: 12:00:00 4 mg via ORAL
  Filled 2019-07-22: qty 1

## 2019-07-22 MED ORDER — SODIUM CHLORIDE (PF) 0.9 % IJ SOLN
INTRAMUSCULAR | Status: DC | PRN
  Administered 2019-07-22: 60 mL

## 2019-07-22 MED ORDER — BUPIVACAINE IN DEXTROSE 0.75-8.25 % IT SOLN
INTRATHECAL | Status: DC | PRN
  Administered 2019-07-22: 1.6 mL via INTRATHECAL

## 2019-07-22 MED ORDER — FLEET ENEMA 7-19 GM/118ML RE ENEM: 1.0000 | ENEMA | Freq: Once | RECTAL | Status: DC | PRN

## 2019-07-22 MED ORDER — BUPIVACAINE HCL (PF) 0.25 % IJ SOLN
INTRAMUSCULAR | Status: AC
  Filled 2019-07-22: qty 30

## 2019-07-22 MED ORDER — MIDAZOLAM HCL 2 MG/2ML IJ SOLN
INTRAMUSCULAR | Status: AC
  Filled 2019-07-22: qty 2

## 2019-07-22 MED ORDER — PROPOFOL 10 MG/ML IV BOLUS
INTRAVENOUS | Status: AC
  Filled 2019-07-22: qty 20

## 2019-07-22 MED ORDER — PHENOL 1.4 % MT LIQD: 1.0000 | OROMUCOSAL | Status: DC | PRN

## 2019-07-22 MED ORDER — FENTANYL CITRATE (PF) 100 MCG/2ML IJ SOLN
INTRAMUSCULAR | Status: DC | PRN
  Administered 2019-07-22 (×2): 50 ug via INTRAVENOUS

## 2019-07-22 MED ORDER — TRANEXAMIC ACID-NACL 1000-0.7 MG/100ML-% IV SOLN
1000.0000 mg | INTRAVENOUS | Status: AC
  Administered 2019-07-22: 1000 mg via INTRAVENOUS
  Filled 2019-07-22: qty 100

## 2019-07-22 MED ORDER — FLUTICASONE PROPIONATE 50 MCG/ACT NA SUSP: 1.0000 | Freq: Every day | NASAL | Status: DC | PRN

## 2019-07-22 MED ORDER — METHOCARBAMOL 500 MG IVPB - SIMPLE MED
500.0000 mg | Freq: Four times a day (QID) | INTRAVENOUS | Status: DC | PRN
  Filled 2019-07-22: qty 50

## 2019-07-22 MED ORDER — ALPRAZOLAM 0.25 MG PO TABS
0.2500 mg | ORAL_TABLET | Freq: Four times a day (QID) | ORAL | Status: DC | PRN
  Administered 2019-07-22: 0.25 mg via ORAL
  Filled 2019-07-22: qty 1

## 2019-07-22 MED ORDER — PROPOFOL 500 MG/50ML IV EMUL
INTRAVENOUS | Status: DC | PRN
  Administered 2019-07-22: 80 ug/kg/min via INTRAVENOUS

## 2019-07-22 MED ORDER — DEXAMETHASONE SODIUM PHOSPHATE 10 MG/ML IJ SOLN
8.0000 mg | Freq: Once | INTRAMUSCULAR | Status: AC
  Administered 2019-07-22: 8 mg via INTRAVENOUS

## 2019-07-22 MED ORDER — LIDOCAINE 2% (20 MG/ML) 5 ML SYRINGE
INTRAMUSCULAR | Status: AC
  Filled 2019-07-22: qty 5

## 2019-07-22 MED ORDER — VANCOMYCIN HCL IN DEXTROSE 1-5 GM/200ML-% IV SOLN
1000.0000 mg | Freq: Two times a day (BID) | INTRAVENOUS | Status: AC
  Administered 2019-07-22: 1000 mg via INTRAVENOUS
  Filled 2019-07-22: qty 200

## 2019-07-22 MED ORDER — ACETAMINOPHEN 325 MG PO TABS: 325.0000 mg | ORAL_TABLET | Freq: Four times a day (QID) | ORAL | Status: DC | PRN

## 2019-07-22 MED ORDER — MORPHINE SULFATE (PF) 2 MG/ML IV SOLN: 0.5000 mg | INTRAVENOUS | Status: DC | PRN

## 2019-07-22 MED ORDER — PROMETHAZINE HCL 25 MG/ML IJ SOLN: 6.2500 mg | INTRAMUSCULAR | Status: DC | PRN

## 2019-07-22 MED ORDER — PROPOFOL 10 MG/ML IV BOLUS
INTRAVENOUS | Status: DC | PRN
  Administered 2019-07-22: 30 mg via INTRAVENOUS

## 2019-07-22 MED ORDER — POVIDONE-IODINE 10 % EX SWAB: 2.0000 "application " | Freq: Once | CUTANEOUS | Status: DC

## 2019-07-22 MED ORDER — METHYLPREDNISOLONE ACETATE 40 MG/ML IJ SUSP
INTRAMUSCULAR | Status: AC
  Filled 2019-07-22: qty 1

## 2019-07-22 MED ORDER — HYDROMORPHONE HCL 1 MG/ML IJ SOLN
0.2500 mg | INTRAMUSCULAR | Status: DC | PRN
  Administered 2019-07-22: 0.5 mg via INTRAVENOUS

## 2019-07-22 MED ORDER — RIVAROXABAN 10 MG PO TABS
10.0000 mg | ORAL_TABLET | Freq: Every day | ORAL | Status: DC
  Administered 2019-07-23: 10 mg via ORAL
  Filled 2019-07-22: qty 1

## 2019-07-22 MED ORDER — DOCUSATE SODIUM 100 MG PO CAPS
100.0000 mg | ORAL_CAPSULE | Freq: Two times a day (BID) | ORAL | Status: DC
  Administered 2019-07-22 – 2019-07-23 (×2): 100 mg via ORAL
  Filled 2019-07-22 (×2): qty 1

## 2019-07-22 MED ORDER — HYDROCODONE-ACETAMINOPHEN 5-325 MG PO TABS
1.0000 | ORAL_TABLET | ORAL | Status: DC | PRN
  Administered 2019-07-22 – 2019-07-23 (×3): 1 via ORAL
  Filled 2019-07-22 (×5): qty 1

## 2019-07-22 MED ORDER — SIMVASTATIN 40 MG PO TABS
40.0000 mg | ORAL_TABLET | Freq: Every day | ORAL | Status: DC
  Administered 2019-07-23: 40 mg via ORAL
  Filled 2019-07-22: qty 1

## 2019-07-22 MED ORDER — HYDROCHLOROTHIAZIDE 25 MG PO TABS
25.0000 mg | ORAL_TABLET | Freq: Every day | ORAL | Status: DC
  Administered 2019-07-23: 10:00:00 25 mg via ORAL
  Filled 2019-07-22: qty 1

## 2019-07-22 MED ORDER — PANTOPRAZOLE SODIUM 40 MG PO TBEC
80.0000 mg | DELAYED_RELEASE_TABLET | Freq: Every day | ORAL | Status: DC
  Administered 2019-07-23: 80 mg via ORAL
  Filled 2019-07-22: qty 2

## 2019-07-22 MED ORDER — LACTATED RINGERS IV SOLN: INTRAVENOUS | Status: DC

## 2019-07-22 MED ORDER — SODIUM CHLORIDE 0.9 % IV SOLN: INTRAVENOUS | Status: DC

## 2019-07-22 MED ORDER — METOCLOPRAMIDE HCL 5 MG/ML IJ SOLN: 5.0000 mg | Freq: Three times a day (TID) | INTRAMUSCULAR | Status: DC | PRN

## 2019-07-22 MED ORDER — GABAPENTIN 100 MG PO CAPS
200.0000 mg | ORAL_CAPSULE | Freq: Three times a day (TID) | ORAL | Status: DC
  Administered 2019-07-22 – 2019-07-23 (×2): 200 mg via ORAL
  Filled 2019-07-22 (×2): qty 2

## 2019-07-22 MED ORDER — BUPIVACAINE-EPINEPHRINE (PF) 0.5% -1:200000 IJ SOLN
INTRAMUSCULAR | Status: DC | PRN
  Administered 2019-07-22: 25 mL

## 2019-07-22 MED ORDER — LIDOCAINE 2% (20 MG/ML) 5 ML SYRINGE
INTRAMUSCULAR | Status: DC | PRN
  Administered 2019-07-22: 40 mg via INTRAVENOUS

## 2019-07-22 MED ORDER — METHYLPREDNISOLONE ACETATE 80 MG/ML IJ SUSP
INTRAMUSCULAR | Status: DC | PRN
  Administered 2019-07-22: 80 mg via INTRA_ARTICULAR

## 2019-07-22 MED ORDER — DEXAMETHASONE SODIUM PHOSPHATE 10 MG/ML IJ SOLN
INTRAMUSCULAR | Status: AC
  Filled 2019-07-22: qty 1

## 2019-07-22 MED ORDER — CLONIDINE HCL (ANALGESIA) 100 MCG/ML EP SOLN
EPIDURAL | Status: DC | PRN
  Administered 2019-07-22: 80 ug

## 2019-07-22 MED ORDER — ACETAMINOPHEN 10 MG/ML IV SOLN
1000.0000 mg | Freq: Once | INTRAVENOUS | Status: AC
  Administered 2019-07-22: 1000 mg via INTRAVENOUS
  Filled 2019-07-22: qty 100

## 2019-07-22 MED ORDER — METHOCARBAMOL 500 MG PO TABS
500.0000 mg | ORAL_TABLET | Freq: Four times a day (QID) | ORAL | Status: DC | PRN
  Administered 2019-07-22: 500 mg via ORAL
  Filled 2019-07-22: qty 1

## 2019-07-22 MED ORDER — ONDANSETRON HCL 4 MG/2ML IJ SOLN
INTRAMUSCULAR | Status: DC | PRN
  Administered 2019-07-22: 4 mg via INTRAVENOUS

## 2019-07-22 MED ORDER — BUPIVACAINE LIPOSOME 1.3 % IJ SUSP
INTRAMUSCULAR | Status: DC | PRN
  Administered 2019-07-22: 20 mL

## 2019-07-22 MED ORDER — HYDROMORPHONE HCL 1 MG/ML IJ SOLN
INTRAMUSCULAR | Status: AC
  Filled 2019-07-22: qty 1

## 2019-07-22 MED ORDER — MENTHOL 3 MG MT LOZG: 1.0000 | LOZENGE | OROMUCOSAL | Status: DC | PRN

## 2019-07-22 MED ORDER — HYDROCODONE-ACETAMINOPHEN 7.5-325 MG PO TABS: 1.0000 | ORAL_TABLET | ORAL | Status: DC | PRN

## 2019-07-22 MED ORDER — PHENYLEPHRINE 40 MCG/ML (10ML) SYRINGE FOR IV PUSH (FOR BLOOD PRESSURE SUPPORT)
PREFILLED_SYRINGE | INTRAVENOUS | Status: DC | PRN
  Administered 2019-07-22 (×2): 40 ug via INTRAVENOUS

## 2019-07-22 MED ORDER — DIPHENHYDRAMINE HCL 12.5 MG/5ML PO ELIX: 12.5000 mg | ORAL_SOLUTION | ORAL | Status: DC | PRN

## 2019-07-22 SURGICAL SUPPLY — 50 items
ATTUNE PSRP INSR SZ4 12 KNEE (Insert) ×1 IMPLANT
BAG DECANTER FOR FLEXI CONT (MISCELLANEOUS) ×1 IMPLANT
BAG ZIPLOCK 12X15 (MISCELLANEOUS) IMPLANT
BLADE SAG 18X100X1.27 (BLADE) ×2 IMPLANT
BLADE SAW SGTL 11.0X1.19X90.0M (BLADE) ×1 IMPLANT
BLADE SURG SZ10 CARB STEEL (BLADE) ×2 IMPLANT
BNDG ELASTIC 6X5.8 VLCR STR LF (GAUZE/BANDAGES/DRESSINGS) ×2 IMPLANT
CLOTH BEACON ORANGE TIMEOUT ST (SAFETY) ×2 IMPLANT
COVER SURGICAL LIGHT HANDLE (MISCELLANEOUS) ×2 IMPLANT
COVER WAND RF STERILE (DRAPES) IMPLANT
CUFF TOURN SGL QUICK 34 (TOURNIQUET CUFF) ×1
CUFF TRNQT CYL 34X4.125X (TOURNIQUET CUFF) ×1 IMPLANT
DECANTER SPIKE VIAL GLASS SM (MISCELLANEOUS) ×2 IMPLANT
DRAPE U-SHAPE 47X51 STRL (DRAPES) ×2 IMPLANT
DRSG AQUACEL AG ADV 3.5X10 (GAUZE/BANDAGES/DRESSINGS) ×1 IMPLANT
DURAPREP 26ML APPLICATOR (WOUND CARE) ×2 IMPLANT
ELECT REM PT RETURN 15FT ADLT (MISCELLANEOUS) ×2 IMPLANT
EVACUATOR 1/8 PVC DRAIN (DRAIN) ×1 IMPLANT
GLOVE BIO SURGEON STRL SZ7 (GLOVE) ×2 IMPLANT
GLOVE BIO SURGEON STRL SZ8 (GLOVE) ×2 IMPLANT
GLOVE BIOGEL PI IND STRL 7.0 (GLOVE) ×1 IMPLANT
GLOVE BIOGEL PI IND STRL 8 (GLOVE) ×1 IMPLANT
GLOVE BIOGEL PI INDICATOR 7.0 (GLOVE) ×1
GLOVE BIOGEL PI INDICATOR 8 (GLOVE) ×1
GOWN STRL REUS W/TWL LRG LVL3 (GOWN DISPOSABLE) ×4 IMPLANT
HANDPIECE INTERPULSE COAX TIP (DISPOSABLE) ×1
HOLDER FOLEY CATH W/STRAP (MISCELLANEOUS) IMPLANT
IMMOBILIZER KNEE 20 (SOFTGOODS) ×2
IMMOBILIZER KNEE 20 THIGH 36 (SOFTGOODS) ×1 IMPLANT
KIT TURNOVER KIT A (KITS) IMPLANT
MANIFOLD NEPTUNE II (INSTRUMENTS) ×2 IMPLANT
NS IRRIG 1000ML POUR BTL (IV SOLUTION) ×2 IMPLANT
PACK TOTAL KNEE CUSTOM (KITS) ×2 IMPLANT
PADDING CAST COTTON 6X4 STRL (CAST SUPPLIES) ×3 IMPLANT
PENCIL SMOKE EVACUATOR (MISCELLANEOUS) IMPLANT
PROTECTOR NERVE ULNAR (MISCELLANEOUS) ×2 IMPLANT
SET HNDPC FAN SPRY TIP SCT (DISPOSABLE) ×1 IMPLANT
STRIP CLOSURE SKIN 1/2X4 (GAUZE/BANDAGES/DRESSINGS) ×2 IMPLANT
SUT MNCRL AB 4-0 PS2 18 (SUTURE) ×2 IMPLANT
SUT STRATAFIX 0 PDS 27 VIOLET (SUTURE) ×2
SUT VIC AB 2-0 CT1 27 (SUTURE) ×3
SUT VIC AB 2-0 CT1 TAPERPNT 27 (SUTURE) ×3 IMPLANT
SUTURE STRATFX 0 PDS 27 VIOLET (SUTURE) ×1 IMPLANT
SWAB COLLECTION DEVICE MRSA (MISCELLANEOUS) IMPLANT
SWAB CULTURE ESWAB REG 1ML (MISCELLANEOUS) IMPLANT
SYR 50ML LL SCALE MARK (SYRINGE) ×4 IMPLANT
TRAY FOLEY MTR SLVR 14FR STAT (SET/KITS/TRAYS/PACK) ×1 IMPLANT
TUBE KAMVAC SUCTION (TUBING) IMPLANT
WATER STERILE IRR 1000ML POUR (IV SOLUTION) ×2 IMPLANT
WRAP KNEE MAXI GEL POST OP (GAUZE/BANDAGES/DRESSINGS) ×1 IMPLANT

## 2019-07-22 NOTE — Discharge Instructions (Addendum)
Ollen Gross, MD Total Joint Specialist [facility] [address], [address] [place], [place] [address] [phone]  TOTAL KNEE REVISION POSTOPERATIVE DIRECTIONS    Knee Rehabilitation, Guidelines Following Surgery  Results after knee surgery are often greatly improved when you follow the exercise, range of motion and muscle strengthening exercises prescribed by your doctor. Safety measures are also important to protect the knee from further injury. If any of these exercises cause you to have increased pain or swelling in your knee joint, decrease the amount until you are comfortable again and slowly increase them. If you have problems or questions, call your caregiver or physical therapist for advice.   BLOOD CLOT PREVENTION . Take a 10 mg Xarelto once a day for three weeks following surgery.  . You may resume your vitamins/supplements upon once you have discontinued the Xarelto. . Do not take any NSAIDs (Advil, Aleve, Ibuprofen, Meloxicam, etc.) until you have discontinued the Xarelto.   HOME CARE INSTRUCTIONS  . Remove items at home which could result in a fall. This includes throw rugs or furniture in walking pathways.  . ICE to the affected knee as much as tolerated. Icing helps control swelling. If the swelling is well controlled you will be more comfortable and rehab easier. Continue to use ice on the knee for pain and swelling from surgery. You may notice swelling that will progress down to the foot and ankle. This is normal after surgery. Elevate the leg when you are not up walking on it.    . Continue to use the breathing machine which will help keep your temperature down. It is common for your temperature to cycle up and down following surgery, especially at night when you are not up moving around and exerting yourself. The breathing machine keeps your lungs expanded and your temperature down. . Do not place pillow under the operative knee, focus on keeping  the knee straight while resting  DIET You may resume your previous home diet once you are discharged from the hospital.  DRESSING / WOUND CARE / SHOWERING . Keep your bulky bandage on for 2 days. On the third post-operative day you may remove the Ace bandage and gauze. There is a waterproof adhesive bandage on your skin which will stay in place until your first follow-up appointment. Once you remove this you will not need to place another bandage . You may begin showering 3 days following surgery, but do not submerge the incision under water.  ACTIVITY For the first 5 days, the key is rest and control of pain and swelling . Do your home exercises twice a day starting on post-operative day 3. On the days you go to physical therapy, just do the home exercises once that day. . You should rest, ice and elevate the leg for 50 minutes out of every hour. Get up and walk/stretch for 10 minutes per hour. After 5 days you can increase your activity slowly as tolerated. . Walk with your walker as instructed. Use the walker until you are comfortable transitioning to a cane. Walk with the cane in the opposite hand of the operative leg. You may discontinue the cane once you are comfortable and walking steadily. . Avoid periods of inactivity such as sitting longer than an hour when not asleep. This helps prevent blood clots.  . You may discontinue the knee immobilizer once you are able to perform a straight leg raise while lying down. . You may resume a sexual relationship in one  month or when given the OK by your doctor.  . You may return to work once you are cleared by your doctor.  . Do not drive a car for 6 weeks or until released by your surgeon.  . Do not drive while taking narcotics.  TED HOSE STOCKINGS Wear the elastic stockings on both legs for three weeks following surgery during the day. You may remove them at night for sleeping.  WEIGHT BEARING Weight bearing as tolerated with assist device  (walker, cane, etc) as directed, use it as long as suggested by your surgeon or therapist, typically at least 4-6 weeks.  POSTOPERATIVE CONSTIPATION PROTOCOL Constipation - defined medically as fewer than three stools per week and severe constipation as less than one stool per week.  One of the most common issues patients have following surgery is constipation.  Even if you have a regular bowel pattern at home, your normal regimen is likely to be disrupted due to multiple reasons following surgery.  Combination of anesthesia, postoperative narcotics, change in appetite and fluid intake all can affect your bowels.  In order to avoid complications following surgery, here are some recommendations in order to help you during your recovery period.  . Colace (docusate) - Pick up an over-the-counter form of Colace or another stool softener and take twice a day as long as you are requiring postoperative pain medications.  Take with a full glass of water daily.  If you experience loose stools or diarrhea, hold the colace until you stool forms back up. If your symptoms do not get better within 1 week or if they get worse, check with your doctor. . Dulcolax (bisacodyl) - Pick up over-the-counter and take as directed by the product packaging as needed to assist with the movement of your bowels.  Take with a full glass of water.  Use this product as needed if not relieved by Colace only.  . MiraLax (polyethylene glycol) - Pick up over-the-counter to have on hand. MiraLax is a solution that will increase the amount of water in your bowels to assist with bowel movements.  Take as directed and can mix with a glass of water, juice, soda, coffee, or tea. Take if you go more than two days without a movement. Do not use MiraLax more than once per day. Call your doctor if you are still constipated or irregular after using this medication for 7 days in a row.  If you continue to have problems with postoperative constipation,  please contact the office for further assistance and recommendations.  If you experience "the worst abdominal pain ever" or develop nausea or vomiting, please contact the office immediatly for further recommendations for treatment.  ITCHING If you experience itching with your medications, try taking only a single pain pill, or even half a pain pill at a time.  You can also use Benadryl over the counter for itching or also to help with sleep.   MEDICATIONS See your medication summary on the "After Visit Summary" that the nursing staff will review with you prior to discharge.  You may have some home medications which will be placed on hold until you complete the course of blood thinner medication.  It is important for you to complete the blood thinner medication as prescribed by your surgeon.  Continue your approved medications as instructed at time of discharge.  PRECAUTIONS . If you experience chest pain or shortness of breath - call 911 immediately for transfer to the hospital emergency department.  Marland Kitchen  If you develop a fever greater that 101 F, purulent drainage from wound, increased redness or drainage from wound, foul odor from the wound/dressing, or calf pain - CONTACT YOUR SURGEON.                                                   FOLLOW-UP APPOINTMENTS Make sure you keep all of your appointments after your operation with your surgeon and caregivers. You should call the office at the above phone number and make an appointment for approximately two weeks after the date of your surgery or on the date instructed by your surgeon outlined in the "After Visit Summary".  RANGE OF MOTION AND STRENGTHENING EXERCISES  Rehabilitation of the knee is important following a knee injury or an operation. After just a few days of immobilization, the muscles of the thigh which control the knee become weakened and shrink (atrophy). Knee exercises are designed to build up the tone and strength of the thigh muscles  and to improve knee motion. Often times heat used for twenty to thirty minutes before working out will loosen up your tissues and help with improving the range of motion but do not use heat for the first two weeks following surgery. These exercises can be done on a training (exercise) mat, on the floor, on a table or on a bed. Use what ever works the best and is most comfortable for you Knee exercises include:  . Leg Lifts - While your knee is still immobilized in a splint or cast, you can do straight leg raises. Lift the leg to 60 degrees, hold for 3 sec, and slowly lower the leg. Repeat 10-20 times 2-3 times daily. Perform this exercise against resistance later as your knee gets better.  Javier Docker and Hamstring Sets - Tighten up the muscle on the front of the thigh (Quad) and hold for 5-10 sec. Repeat this 10-20 times hourly. Hamstring sets are done by pushing the foot backward against an object and holding for 5-10 sec. Repeat as with quad sets.   Leg Slides: Lying on your back, slowly slide your foot toward your buttocks, bending your knee up off the floor (only go as far as is comfortable). Then slowly slide your foot back down until your leg is flat on the floor again.  Angel Wings: Lying on your back spread your legs to the side as far apart as you can without causing discomfort.  A rehabilitation program following serious knee injuries can speed recovery and prevent re-injury in the future due to weakened muscles. Contact your doctor or a physical therapist for more information on knee rehabilitation.   IF YOU ARE TRANSFERRED TO A SKILLED REHAB FACILITY If the patient is transferred to a skilled rehab facility following release from the hospital, a list of the current medications will be sent to the facility for the patient to continue.  When discharged from the skilled rehab facility, please have the facility set up the patient's [place] prior to being released. Also, the skilled  facility will be responsible for providing the patient with their medications at time of release from the facility to include their pain medication, the muscle relaxants, and their blood thinner medication. If the patient is still at the rehab facility at time of the two week follow up appointment, the skilled rehab  facility will also need to assist the patient in arranging follow up appointment in our office and any transportation needs.  MAKE SURE YOU:  . Understand these instructions.  . Get help right away if you are not doing well or get worse.    Pick up stool softner and laxative for home use following surgery while on pain medications. Do not submerge incision under water. Please use good hand washing techniques while changing dressing each day. May shower starting three days after surgery. Please use a clean towel to pat the incision dry following showers. Continue to use ice for pain and swelling after surgery. Do not use any lotions or creams on the incision until instructed by your surgeon.  Information on my medicine - XARELTO (Rivaroxaban)  This medication education was reviewed with me or my healthcare representative as part of my discharge preparation.  The pharmacist that spoke with me during my hospital stay was:    Why was Xarelto prescribed for you? Xarelto was prescribed for you to reduce the risk of blood clots forming after orthopedic surgery. The medical term for these abnormal blood clots is venous thromboembolism (VTE).  What do you need to know about xarelto ? Take your Xarelto ONCE DAILY at the same time every day. You may take it either with or without food.  If you have difficulty swallowing the tablet whole, you may crush it and mix in applesauce just prior to taking your dose.  Take Xarelto exactly as prescribed by your doctor and DO NOT stop taking Xarelto without talking to the doctor who prescribed the medication.  Stopping without other VTE  prevention medication to take the place of Xarelto may increase your risk of developing a clot.  After discharge, you should have regular check-up appointments with your healthcare provider that is prescribing your Xarelto.    What do you do if you miss a dose? If you miss a dose, take it as soon as you remember on the same day then continue your regularly scheduled once daily regimen the next day. Do not take two doses of Xarelto on the same day.   Important Safety Information A possible side effect of Xarelto is bleeding. You should call your healthcare provider right away if you experience any of the following: ? Bleeding from an injury or your nose that does not stop. ? Unusual colored urine (red or dark brown) or unusual colored stools (red or black). ? Unusual bruising for unknown reasons. ? A serious fall or if you hit your head (even if there is no bleeding).  Some medicines may interact with Xarelto and might increase your risk of bleeding while on Xarelto. To help avoid this, consult your healthcare provider or pharmacist prior to using any new prescription or non-prescription medications, including herbals, vitamins, non-steroidal anti-inflammatory drugs (NSAIDs) and supplements.  This website has more information on Xarelto: [email or web address]

## 2019-07-22 NOTE — Evaluation (Signed)
Physical Therapy Evaluation Patient Details Name: Connie Branch MRN: [id number] DOB: 11/30/1950 Today's Date: 07/22/2019   History of Present Illness  Patient is 69 y.o female s/p Rt TKR on 3.17.21 with PMH significant for osteopenia, obesity, HTN, neuropathy,GERD, OA, Rt TKA on 06/21/17.    Clinical Impression  [name] is a 69 y.o. female POD 0 s/p Rt TKR. Patient reports modified independence with SPC and RW for mobility over the last 2 years. Patient is now limited by functional impairments (see PT problem list below) and requires min assist for transfers and gait with RW. Patient was able to ambulate ~70 feet with RW and min assist. Patient instructed in exercise to facilitate ROM and circulation. Patient will benefit from continued skilled PT interventions to address impairments and progress towards PLOF. Acute PT will follow to progress mobility and stair training in preparation for safe discharge home.     Follow Up Recommendations Follow surgeon's recommendation for DC plan and follow-up therapies    Equipment Recommendations  Rolling walker with 5" wheels    Recommendations for Other Services       Precautions / Restrictions Precautions Precautions: Fall Restrictions Weight Bearing Restrictions: No      Mobility  Bed Mobility Overal bed mobility: Needs Assistance Bed Mobility: Supine to Sit     Supine to sit: Min assist     General bed mobility comments: cues for use of bed rail, cues for sequencing to scoot/pivot to EOB, assist to raise trunk fully  Transfers Overall transfer level: Needs assistance Equipment used: Rolling walker (2 wheeled) Transfers: Sit to/from Stand Sit to Stand: Min assist         General transfer comment: cues for safe hand placement and technique with RW, assist required to steady during power up.  Ambulation/Gait Ambulation/Gait assistance: Min assist Gait Distance (Feet): 70 Feet Assistive device: Rolling walker (2  wheeled) Gait Pattern/deviations: Step-to pattern;Decreased stance time - right Gait velocity: fair   General Gait Details: cues for step pattern and to maintain safe proximity to RW, no overt LOB noted, no Rt knee buckling observed.  Stairs            Wheelchair Mobility    Modified Rankin (Stroke Patients Only)       Balance Overall balance assessment: Needs assistance Sitting-balance support: Feet supported Sitting balance-Leahy Scale: Good     Standing balance support: Bilateral upper extremity supported;During functional activity Standing balance-Leahy Scale: Fair              Pertinent Vitals/Pain Pain Assessment: 0-10 Pain Score: 7  Pain Location: Rt knee Pain Descriptors / Indicators: Aching;Grimacing Pain Intervention(s): Limited activity within patient's tolerance;Monitored during session;Repositioned;Ice applied    Home Living Family/patient expects to be discharged to:: Private residence Living Arrangements: Other relatives Available Help at Discharge: Available 24 hours/day Type of Home: House Home Access: Stairs to enter Entrance Stairs-Rails: None Entrance Stairs-Number of Steps: 1(curb) Home Layout: Two level;Bed/bath upstairs;1/2 bath on main level Home Equipment: Emergency planning/management officer - 2 wheels;Cane - single point;Grab bars - tub/shower      Prior Function Level of Independence: Independent with assistive device(s)         Comments: using SPC in home and RW in community     Hand Dominance   Dominant Hand: Right    Extremity/Trunk Assessment   Upper Extremity Assessment Upper Extremity Assessment: Overall WFL for tasks assessed    Lower Extremity Assessment Lower Extremity Assessment: RLE deficits/detail RLE Deficits / Details: good  quad activation, no extensor lag with SLR RLE Sensation: WNL RLE Coordination: WNL    Cervical / Trunk Assessment Cervical / Trunk Assessment: Normal  Communication   Communication: No  difficulties  Cognition Arousal/Alertness: Awake/alert Behavior During Therapy: WFL for tasks assessed/performed Overall Cognitive Status: Within Functional Limits for tasks assessed              General Comments: pt very forthcoming with information      General Comments      Exercises Total Joint Exercises Ankle Circles/Pumps: AROM;Both;15 reps;Seated Quad Sets: AROM;5 reps;Right;Seated Heel Slides: AROM;Right;5 reps;Seated   Assessment/Plan    PT Assessment Patient needs continued PT services  PT Problem List Decreased strength;Decreased range of motion;Decreased activity tolerance;Decreased balance;Decreased mobility       PT Treatment Interventions Gait training;DME instruction;Stair training;Functional mobility training;Therapeutic activities;Balance training;Therapeutic exercise;Patient/family education    PT Goals (Current goals can be found in the Care Plan section)  Acute Rehab PT Goals Patient Stated Goal: for knee to stop hyperextending PT Goal Formulation: With patient Time For Goal Achievement: 07/29/19 Potential to Achieve Goals: Good    Frequency 7X/week    AM-PAC PT "6 Clicks" Mobility  Outcome Measure Help needed turning from your back to your side while in a flat bed without using bedrails?: A Little Help needed moving from lying on your back to sitting on the side of a flat bed without using bedrails?: A Little Help needed moving to and from a bed to a chair (including a wheelchair)?: A Little Help needed standing up from a chair using your arms (e.g., wheelchair or bedside chair)?: A Little Help needed to walk in hospital room?: A Little Help needed climbing 3-5 steps with a railing? : A Little 6 Click Score: 18    End of Session Equipment Utilized During Treatment: Gait belt Activity Tolerance: Patient tolerated treatment well Patient left: in chair;with call bell/phone within reach;with chair alarm set;with nursing/sitter in room Nurse  Communication: Mobility status PT Visit Diagnosis: Muscle weakness (generalized) (M62.81);Difficulty in walking, not elsewhere classified (R26.2)    Time: 5830-9407 PT Time Calculation (min) (ACUTE ONLY): 38 min   Charges:   PT Evaluation $PT Eval Low Complexity: 1 Low PT Treatments $Gait Training: 8-22 mins $Therapeutic Exercise: 8-22 mins       Verner Mould, DPT Physical Therapist with [facility] [phone]  07/22/2019 5:54 PM

## 2019-07-22 NOTE — Anesthesia Procedure Notes (Addendum)
Anesthesia Regional Block: Adductor canal block   Pre-Anesthetic Checklist: ,, timeout performed, Correct Patient, Correct Site, Correct Laterality, Correct Procedure, Correct Position, site marked, Risks and benefits discussed, Surgical consent,  Pre-op evaluation,  Post-op pain management  Laterality: Right  Prep: chloraprep       Needles:  Injection technique: Single-shot  Needle Type: Stimiplex     Needle Length: 9cm  Needle Gauge: 21     Additional Needles:   Procedures:,,,, ultrasound used (permanent image in chart),,,,  Narrative:  Start time: 07/22/2019 8:09 AM End time: 07/22/2019 8:14 AM Injection made incrementally with aspirations every 5 mL.  Performed by: Personally  Anesthesiologist: [name], MD  Additional Notes: BP cuff, EKG monitors applied. Sedation begun prior to my arrival to the room for time out and procedure (2mg  midazolam and fentanyl). Artery and nerve location verified with U/S and anesthetic injected incrementally, slowly, and after negative aspirations under direct u/s guidance. Good fascial /perineural spread. Tolerated well.

## 2019-07-22 NOTE — Interval H&P Note (Signed)
History and Physical Interval Note:  07/22/2019 8:14 AM  Connie Branch  has presented today for surgery, with the diagnosis of unstable right total knee arthroplasty.  The various methods of treatment have been discussed with the patient and family. After consideration of risks, benefits and other options for treatment, the patient has consented to  Procedure(s) with comments: Right knee polyethylene vs total knee arthroplasty revision (Right) - as a surgical intervention.  The patient's history has been reviewed, patient examined, no change in status, stable for surgery.  I have reviewed the patient's chart and labs.  Questions were answered to the patient's satisfaction.     Homero Fellers [name]

## 2019-07-22 NOTE — Brief Op Note (Signed)
07/22/2019  9:20 AM  PATIENT:  Connie Branch  69 y.o. female  PRE-OPERATIVE DIAGNOSIS:  unstable right total knee arthroplasty  POST-OPERATIVE DIAGNOSIS:  unstable right total knee arthroplasty  PROCEDURE:  Procedure(s) with comments: Right knee polyethylene exchange (Right) -  SURGEON:  Surgeon(s) and Role:    Ollen Gross, MD - Primary  PHYSICIAN ASSISTANT:   ASSISTANTS: Dennie Bible, PA-C   ANESTHESIA:   Adductor canal block and spinal  EBL:  50 mL   BLOOD ADMINISTERED:none  DRAINS: None  LOCAL MEDICATIONS USED:  OTHER Exparel  COUNTS:  YES  TOURNIQUET:   Total Tourniquet Time Documented: Thigh (Right) - 22 minutes Total: Thigh (Right) - 22 minutes   DICTATION: .Other Dictation: Dictation Number [id number]  PLAN OF CARE: Admit for overnight observation  PATIENT DISPOSITION:  PACU - hemodynamically stable.

## 2019-07-22 NOTE — Anesthesia Procedure Notes (Signed)
Procedure Name: MAC Date/Time: 07/22/2019 8:29 AM Performed by: Eben Burow, CRNA Pre-anesthesia Checklist: Patient identified, Emergency Drugs available, Suction available, Patient being monitored and Timeout performed Oxygen Delivery Method: Simple face mask Dental Injury: Teeth and Oropharynx as per pre-operative assessment

## 2019-07-22 NOTE — Anesthesia Procedure Notes (Signed)
Spinal  Patient location during procedure: OR Staffing Anesthesiologist: [name], MD Preanesthetic Checklist Completed: patient identified, IV checked, site marked, risks and benefits discussed, surgical consent, monitors and equipment checked, pre-op evaluation and timeout performed Spinal Block Patient position: sitting Prep: DuraPrep and site prepped and draped Patient monitoring: heart rate, cardiac monitor, continuous pulse ox and blood pressure Approach: right paramedian Location: L3-4 Injection technique: single-shot Needle Needle type: Sprotte  Needle gauge: 24 G Needle length: 9 cm Assessment Sensory level: T4

## 2019-07-22 NOTE — Op Note (Signed)
Connie Branch, Connie Branch MEDICAL RECORD [id number] ACCOUNT [id number] DATE OF BIRTH:11/21/1950 FACILITY: [facility] LOCATION: [facility] PHYSICIAN:Haile Toppins Zella Ball, MD  OPERATIVE REPORT  DATE OF PROCEDURE:  07/22/2019  PREOPERATIVE DIAGNOSES:   1.  Unstable right total knee arthroplasty. 2.  Osteoarthritis of left knee.    POSTOPERATIVE DIAGNOSES:   1.  Unstable right total knee arthroplasty. 2.  Osteoarthritis of left knee.    PROCEDURE:   1.  Right knee polyethylene revision. 2.  Cortisone injection left knee.  SURGEON:  Gaynelle Arabian, MD  ASSISTANT:  Griffith Citron, PA-C  ANESTHESIA:  Adductor canal block and spinal.  ESTIMATED BLOOD LOSS:  Minimal.  DRAINS:  None.  TOURNIQUET TIME:  22 minutes at 300 mmHg.  COMPLICATIONS:  None.  CONDITION:  Stable to recovery.  BRIEF CLINICAL NOTE:  The patient is a 69 year old female who had a right total knee arthroplasty done approximately 2-3 years ago and came in for a 2nd opinion regarding pain and instability in the knee.  She has tried physical therapy, which has not  been beneficial.  She has had progressively worsening instability.  She does not have any apparent loosening of her metal components on x-ray.  She presents now for polyethylene versus total knee revision.  PROCEDURE IN DETAIL:  After successful administration of adductor canal block and spinal anesthetic, a tourniquet was placed high on her right thigh and her right lower extremity was prepped and draped in the usual sterile fashion.  Extremity was wrapped  in Esmarch, knee flexed, and tourniquet inflated to 300 mmHg.  Midline incision was made with a 10 blade through subcutaneous tissue to the level of the extensor mechanism.  A fresh blade was used to make a medial parapatellar arthrotomy.  We did not  encounter an effusion.  Soft tissue over the proximal medial tibia subperiosteally elevated to the joint line with a knife and into the semimembranosus bursa with a  Cobb elevator.  Soft tissue laterally was elevated with attention being paid to avoid the  patellar tendon on tibial tubercle.  Patella was everted, knee flexed, 90 degrees.  The tibia was then subluxed forward and dislocated from the femur.  I was easily able to remove the tibial polyethylene.  This was a size 4 posterior stabilized rotating platform insert from [employer] knee.  It was 7 mm thick.  I inspected the metal  components and the interface between metal and bone on both the tibial and femoral surface and everything looked normal.  The components were stable.  I then trialed a 10 mm insert and with the 10, she still hyperextended a few degrees.  With a 12, she  went to full extension without any hyperextension with excellent varus/valgus and anterior/posterior balance throughout full range of motion.  I attempted to reduce the knee with a 14 mm insert, but it would not reduce as the 14 was too thick.  I then  placed the permanent 12 mm posterior stabilized rotating platform insert for the size 4 into the tibial tray and we reduced the knee.  Once again, she had excellent range of motion throughout a full range, which is about 0-130.  The wound was copiously  irrigated with saline solution.  Any remaining synovium is removed with electrocautery.  Twenty mL of Exparel mixed with 60 mL of saline was injected into the extensor mechanism, periosteum of the femur, and subcutaneous tissue.  The arthrotomy was then  closed with a running 0 Stratafix suture.  Flexion against gravity was  about 135 degrees and there was excellent stability throughout full range of motion.  Note that preoperatively, she had severe varus valgus laxity and hyperextended about 20 degrees.   She also had severe AP laxity.  This was all corrected with the thicker polyethylene.  After the arthrotomy was closed, the tourniquet was then released with total time of 22 minutes.  Subcutaneous was closed with interrupted 2-0 Vicryl and  subcuticular  running 4-0 Monocryl.  Incisions cleaned and dried and Steri-Strips and a bulky sterile dressing are applied.  She was placed into a knee immobilizer.  I then prepped the left knee with Betadine and injected the left knee with 80 mg of Depo-Medrol.  This was tolerated without difficulty.  The patient was subsequently awakened and transported to recovery in stable condition.  CN/NUANCE  D:07/22/2019 T:07/22/2019 JOB:[id number]

## 2019-07-22 NOTE — Addendum Note (Signed)
Addendum  created 07/22/19 1339 by [name], MD   Clinical Note Signed, SmartForm saved

## 2019-07-22 NOTE — Transfer of Care (Signed)
Immediate Anesthesia Transfer of Care Note  Patient: Connie Branch  Procedure(s) Performed: Right knee polyethylene exchange (Right Knee)  Patient Location: PACU  Anesthesia Type:Spinal  Level of Consciousness: awake and alert   Airway & Oxygen Therapy: Patient Spontanous Breathing and Patient connected to face mask oxygen  Post-op Assessment: Report given to RN and Post -op Vital signs reviewed and stable  Post vital signs: Reviewed and stable  Last Vitals:  Vitals Value Taken Time  BP 116/73 07/22/19 0945  Temp    Pulse 78 07/22/19 0947  Resp 11 07/22/19 0947  SpO2 100 % 07/22/19 0947  Vitals shown include unvalidated device data.  Last Pain:  Vitals:   07/22/19 0720  TempSrc: Oral         Complications: No apparent anesthesia complications

## 2019-07-22 NOTE — Plan of Care (Signed)
  Problem: Education: Goal: Knowledge of the prescribed therapeutic regimen will improve Outcome: Progressing   Problem: Activity: Goal: Ability to avoid complications of mobility impairment will improve Outcome: Progressing   Problem: Pain Management: Goal: Pain level will decrease with appropriate interventions Outcome: Progressing   

## 2019-07-22 NOTE — Anesthesia Postprocedure Evaluation (Addendum)
Anesthesia Post Note  Patient: Connie Branch  Procedure(s) Performed: Right knee polyethylene exchange (Right Knee)     Patient location during evaluation: PACU Anesthesia Type: Regional and Spinal Level of consciousness: awake and alert Pain management: pain level controlled Vital Signs Assessment: post-procedure vital signs reviewed and stable Respiratory status: spontaneous breathing Cardiovascular status: stable Anesthetic complications: no    Last Vitals:  Vitals:   07/22/19 1153 07/22/19 1257  BP: 139/73 (!) 150/75  Pulse: (!) 57 (!) 50  Resp: 16 17  Temp: 36.5 C   SpO2: 100% 100%    Last Pain:  Vitals:   07/22/19 1055  TempSrc: Oral  PainSc: 4                  [name]

## 2019-07-23 ENCOUNTER — Encounter: Payer: Self-pay | Admitting: *Deleted

## 2019-07-23 LAB — CBC
HCT: 32.8 % — ABNORMAL LOW (ref 36.0–46.0)
Hemoglobin: 10.6 g/dL — ABNORMAL LOW (ref 12.0–15.0)
MCH: 29.9 pg (ref 26.0–34.0)
MCHC: 32.3 g/dL (ref 30.0–36.0)
MCV: 92.7 fL (ref 80.0–100.0)
Platelets: 133 10*3/uL — ABNORMAL LOW (ref 150–400)
RBC: 3.54 MIL/uL — ABNORMAL LOW (ref 3.87–5.11)
RDW: 14.2 % (ref 11.5–15.5)
WBC: 12.1 10*3/uL — ABNORMAL HIGH (ref 4.0–10.5)
nRBC: 0 % (ref 0.0–0.2)

## 2019-07-23 LAB — BASIC METABOLIC PANEL
Anion gap: 7 (ref 5–15)
BUN: 17 mg/dL (ref 8–23)
CO2: 25 mmol/L (ref 22–32)
Calcium: 8.9 mg/dL (ref 8.9–10.3)
Chloride: 105 mmol/L (ref 98–111)
Creatinine, Ser: 0.8 mg/dL (ref 0.44–1.00)
GFR calc Af Amer: >60 mL/min (ref >60)
GFR calc non Af Amer: >60 mL/min (ref >60)
Glucose, Bld: 169 mg/dL — ABNORMAL HIGH (ref 70–99)
Potassium: 4.3 mmol/L (ref 3.5–5.1)
Sodium: 137 mmol/L (ref 135–145)

## 2019-07-23 MED ORDER — HYDROCODONE-ACETAMINOPHEN 5-325 MG PO TABS: 1.0000 | ORAL_TABLET | Freq: Four times a day (QID) | ORAL | 0 refills | Status: DC | PRN

## 2019-07-23 MED ORDER — TRAMADOL HCL 50 MG PO TABS: 50.0000 mg | ORAL_TABLET | Freq: Four times a day (QID) | ORAL | 0 refills | Status: DC | PRN

## 2019-07-23 MED ORDER — GABAPENTIN 100 MG PO CAPS: 200.0000 mg | ORAL_CAPSULE | Freq: Three times a day (TID) | ORAL | 0 refills | Status: DC

## 2019-07-23 MED ORDER — METHOCARBAMOL 500 MG PO TABS: 500.0000 mg | ORAL_TABLET | Freq: Four times a day (QID) | ORAL | 0 refills | Status: DC | PRN

## 2019-07-23 MED ORDER — RIVAROXABAN 10 MG PO TABS: 10.0000 mg | ORAL_TABLET | Freq: Every day | ORAL | 0 refills | Status: DC

## 2019-07-23 NOTE — Progress Notes (Signed)
Physical Therapy Treatment Patient Details Name: Connie Branch MRN: [id number] DOB: 1951/01/16 Today's Date: 07/23/2019    History of Present Illness Patient is 69 y.o female s/p Rt TKR on 3.17.21 with PMH significant for osteopenia, obesity, HTN, neuropathy,GERD, OA, Rt TKA on 06/21/17.    PT Comments    Pt progressing well with mobility and eager for dc home.  Will follow up with HEP.   Follow Up Recommendations  Follow surgeon's recommendation for DC plan and follow-up therapies     Equipment Recommendations  Rolling walker with 5" wheels    Recommendations for Other Services       Precautions / Restrictions Precautions Precautions: Fall Restrictions Weight Bearing Restrictions: No    Mobility  Bed Mobility               General bed mobility comments: up with nursing  Transfers Overall transfer level: Needs assistance Equipment used: Rolling walker (2 wheeled) Transfers: Sit to/from Stand Sit to Stand: Min guard;Supervision         General transfer comment: min cues for LE management and use of UEs to self assist  Ambulation/Gait Ambulation/Gait assistance: Min guard;Supervision Gait Distance (Feet): 200 Feet Assistive device: Rolling walker (2 wheeled) Gait Pattern/deviations: Step-to pattern;Step-through pattern;Decreased step length - right;Decreased step length - left;Shuffle;Trunk flexed Gait velocity: fair   General Gait Details: cues for step pattern and to maintain safe proximity to RW, no overt LOB noted, no Rt knee buckling observed.   Stairs Stairs: Yes Stairs assistance: Min guard Stair Management: Two rails;Step to pattern;Forwards;Sideways;One rail Right Number of Stairs: 7 General stair comments: bil rails up; single rail sideways down; pt self cues for sequence   Wheelchair Mobility    Modified Rankin (Stroke Patients Only)       Balance Overall balance assessment: Needs assistance Sitting-balance support: Feet  supported Sitting balance-Leahy Scale: Good     Standing balance support: Bilateral upper extremity supported;During functional activity Standing balance-Leahy Scale: Fair                              Cognition Arousal/Alertness: Awake/alert Behavior During Therapy: WFL for tasks assessed/performed Overall Cognitive Status: Within Functional Limits for tasks assessed                                        Exercises      General Comments        Pertinent Vitals/Pain Pain Assessment: 0-10 Pain Score: 5  Pain Location: Rt knee Pain Descriptors / Indicators: Aching;Sore Pain Intervention(s): Limited activity within patient's tolerance;Monitored during session;Premedicated before session    Home Living                      Prior Function            PT Goals (current goals can now be found in the care plan section) Acute Rehab PT Goals Patient Stated Goal: for knee to stop hyperextending PT Goal Formulation: With patient Time For Goal Achievement: 07/29/19 Potential to Achieve Goals: Good Progress towards PT goals: Progressing toward goals    Frequency    7X/week      PT Plan Current plan remains appropriate    Co-evaluation              AM-PAC PT "6 Clicks" Mobility   Outcome Measure  Help needed turning from your back to your side while in a flat bed without using bedrails?: A Little Help needed moving from lying on your back to sitting on the side of a flat bed without using bedrails?: A Little Help needed moving to and from a bed to a chair (including a wheelchair)?: A Little Help needed standing up from a chair using your arms (e.g., wheelchair or bedside chair)?: A Little Help needed to walk in hospital room?: A Little Help needed climbing 3-5 steps with a railing? : A Little 6 Click Score: 18    End of Session Equipment Utilized During Treatment: Gait belt Activity Tolerance: Patient tolerated treatment  well Patient left: in chair;with call bell/phone within reach;with chair alarm set Nurse Communication: Mobility status PT Visit Diagnosis: Muscle weakness (generalized) (M62.81);Difficulty in walking, not elsewhere classified (R26.2)     Time: 4627-0350 PT Time Calculation (min) (ACUTE ONLY): 27 min  Charges:  $Gait Training: 23-37 mins                     Mauro Kaufmann PT Acute Rehabilitation Services Pager [phone] Office [phone]    Connie Branch 07/23/2019, 11:30 AM

## 2019-07-23 NOTE — Progress Notes (Signed)
Physical Therapy Treatment Patient Details Name: Connie Branch MRN: [id number] DOB: 11-Jan-1951 Today's Date: 07/23/2019    History of Present Illness Patient is 69 y.o female s/p Rt TKR on 3.17.21 with PMH significant for osteopenia, obesity, HTN, neuropathy,GERD, OA, Rt TKA on 06/21/17.    PT Comments    Pt performed HEP program with assist - written instruction provided.   Follow Up Recommendations  Follow surgeon's recommendation for DC plan and follow-up therapies     Equipment Recommendations  Rolling walker with 5" wheels    Recommendations for Other Services       Precautions / Restrictions Precautions Precautions: Fall Restrictions Weight Bearing Restrictions: No    Mobility  Bed Mobility               General bed mobility comments: up with nursing  Transfers Overall transfer level: Needs assistance Equipment used: Rolling walker (2 wheeled) Transfers: Sit to/from Stand Sit to Stand: Min guard;Supervision         General transfer comment: min cues for LE management and use of UEs to self assist  Ambulation/Gait Ambulation/Gait assistance: Min guard;Supervision Gait Distance (Feet): 200 Feet Assistive device: Rolling walker (2 wheeled) Gait Pattern/deviations: Step-to pattern;Step-through pattern;Decreased step length - right;Decreased step length - left;Shuffle;Trunk flexed Gait velocity: fair   General Gait Details: cues for step pattern and to maintain safe proximity to RW, no overt LOB noted, no Rt knee buckling observed.   Stairs Stairs: Yes Stairs assistance: Min guard Stair Management: Two rails;Step to pattern;Forwards;Sideways;One rail Right Number of Stairs: 7 General stair comments: bil rails up; single rail sideways down; pt self cues for sequence   Wheelchair Mobility    Modified Rankin (Stroke Patients Only)       Balance Overall balance assessment: Needs assistance Sitting-balance support: Feet supported Sitting  balance-Leahy Scale: Good     Standing balance support: Bilateral upper extremity supported;During functional activity Standing balance-Leahy Scale: Fair                              Cognition Arousal/Alertness: Awake/alert Behavior During Therapy: WFL for tasks assessed/performed Overall Cognitive Status: Within Functional Limits for tasks assessed                                 General Comments: pt very forthcoming with information      Exercises Total Joint Exercises Ankle Circles/Pumps: AROM;Both;Seated;20 reps Quad Sets: AROM;Right;Seated;10 reps Heel Slides: Right;AAROM;15 reps;Supine Straight Leg Raises: AROM;15 reps;Supine;Right Goniometric ROM: AAROM R knee 0 - 50    General Comments        Pertinent Vitals/Pain Pain Assessment: 0-10 Pain Score: 5  Pain Location: Rt knee Pain Descriptors / Indicators: Aching;Sore Pain Intervention(s): Limited activity within patient's tolerance;Monitored during session;Premedicated before session;Ice applied    Home Living                      Prior Function            PT Goals (current goals can now be found in the care plan section) Acute Rehab PT Goals Patient Stated Goal: for knee to stop hyperextending PT Goal Formulation: With patient Time For Goal Achievement: 07/29/19 Potential to Achieve Goals: Good Progress towards PT goals: Progressing toward goals    Frequency    7X/week      PT Plan Current plan remains appropriate  Co-evaluation              AM-PAC PT "6 Clicks" Mobility   Outcome Measure  Help needed turning from your back to your side while in a flat bed without using bedrails?: A Little Help needed moving from lying on your back to sitting on the side of a flat bed without using bedrails?: A Little Help needed moving to and from a bed to a chair (including a wheelchair)?: A Little Help needed standing up from a chair using your arms (e.g.,  wheelchair or bedside chair)?: A Little Help needed to walk in hospital room?: A Little Help needed climbing 3-5 steps with a railing? : A Little 6 Click Score: 18    End of Session Equipment Utilized During Treatment: Gait belt Activity Tolerance: Patient tolerated treatment well Patient left: in chair;with call bell/phone within reach;with chair alarm set Nurse Communication: Mobility status PT Visit Diagnosis: Muscle weakness (generalized) (M62.81);Difficulty in walking, not elsewhere classified (R26.2)     Time: 6314-9702 PT Time Calculation (min) (ACUTE ONLY): 22 min  Charges:  $Gait Training: 23-37 mins $Therapeutic Exercise: 8-22 mins                     Mauro Kaufmann PT Acute Rehabilitation Services Pager [phone] Office [phone]    Tanyia Grabbe 07/23/2019, 11:37 AM

## 2019-07-23 NOTE — Plan of Care (Signed)
  Problem: Pain Management: Goal: Pain level will decrease with appropriate interventions Outcome: Progressing   

## 2019-07-23 NOTE — Progress Notes (Signed)
Pt provided with d/c instructions. After discussing the pt's plan of care u[on d/c home, pt denied any further questions or concerns.   Pt requested to use CPM prior to d/c. Using CPM now. Ride will be here at 1200 per pt.

## 2019-07-23 NOTE — Progress Notes (Signed)
Subjective: 1 Day Post-Op Procedure(s) (LRB): Right knee polyethylene exchange (Right) Patient reports pain as mild.   Patient seen in rounds with Dr. Wynelle Link. Patient is well, and has had no acute complaints or problems, slept well last night. States she is ready to go home. Denies chest pain, SOB, or calf pain. Foley catheter to be removed this AM. No issues overnight.  We will continue therapy today, ambulated 30' yesterday.   Objective: Vital signs in last 24 hours: Temp:  [97.4 F (36.3 C)-98.6 F (37 C)] 97.9 F (36.6 C) (03/18 0449) Pulse Rate:  [49-78] 72 (03/18 0449) Resp:  [11-17] 16 (03/18 0449) BP: (116-162)/(66-95) 139/80 (03/18 0449) SpO2:  [99 %-100 %] 100 % (03/18 0449) Weight:  [73.5 kg] 73.5 kg (03/17 0731)  Intake/Output from previous day:  Intake/Output Summary (Last 24 hours) at 07/23/2019 0723 Last data filed at 07/23/2019 0538 Gross per 24 hour  Intake 3688.4 ml  Output 2070 ml  Net 1618.4 ml    Labs: Recent Labs    07/23/19 0304  HGB 10.6*   Recent Labs    07/23/19 0304  WBC 12.1*  RBC 3.54*  HCT 32.8*  PLT 133*   Recent Labs    07/22/19 0720 07/23/19 0304  NA 139 137  K 4.0 4.3  CL 105 105  CO2 24 25  BUN 28* 17  CREATININE 1.02* 0.80  GLUCOSE 109* 169*  CALCIUM 9.9 8.9   Exam: General - Patient is Alert and Oriented Extremity - Neurologically intact Neurovascular intact Sensation intact distally Dorsiflexion/Plantar flexion intact Dressing - dressing C/D/I Motor Function - intact, moving foot and toes well on exam.   Past Medical History:  Diagnosis Date  . ARF (acute renal failure) secondary to dehydration from osmotic diuresis 05/21/2013   KIDNEY FUNCTION NORMAL NOW  . Arthritis   . Cataracts, bilateral   . DDD (degenerative disc disease)   . DKA (diabetic ketoacidoses) ([place]) 05/21/2013   TYPE 2  . GERD (gastroesophageal reflux disease)   . Headache    SINUS  . High cholesterol   . Hypertension   . Neuropathy  2015   feet  . Obesity   . Osteopenia     Assessment/Plan: 1 Day Post-Op Procedure(s) (LRB): Right knee polyethylene exchange (Right) Principal Problem:   Failed total knee arthroplasty (HCC) Active Problems:   Failed total right knee replacement (HCC)  Estimated body mass index is 28.7 kg/m as calculated from the following:   Height as of this encounter: 5\' 3"  (1.6 m).   Weight as of this encounter: 73.5 kg. Advance diet Up with therapy D/C IV fluids   Patient's anticipated LOS is less than 2 midnights, meeting these requirements: - Younger than 68 - Lives within 1 hour of care - Has a competent adult at home to recover with post-op recover - NO history of  - Chronic pain requiring opiods  - Diabetes  - Coronary Artery Disease  - Heart failure  - Heart attack  - Stroke  - DVT/VTE  - Cardiac arrhythmia  - Respiratory Failure/COPD  - Renal failure  - Anemia  - Advanced Liver disease  DVT Prophylaxis - Xarelto Weight bearing as tolerated. D/C O2 and pulse ox and try on room air.  Able to perform straight leg raise this AM without difficulty, will D/C knee immobilizer.   Plan is to go Home after hospital stay. Plan for discharge after one session of therapy around lunch. Scheduled for outpatient physical therapy at [facility].  Follow-up in the office in 2 weeks.   Arther Abbott, PA-C Orthopedic Surgery [phone] 07/23/2019, 7:23 AM

## 2019-07-24 NOTE — Discharge Summary (Signed)
Physician Discharge Summary   Patient ID: Connie Branch MRN: [id number] DOB/AGE: 12-Jan-1951 69 y.o.  Admit date: 07/22/2019 Discharge date: 07/23/2019  Primary Diagnosis:  1. Unstable right total knee arthroplasty 2. Osteoarthritis of left knee   Admission Diagnoses:  Past Medical History:  Diagnosis Date  . ARF (acute renal failure) secondary to dehydration from osmotic diuresis 05/21/2013   KIDNEY FUNCTION NORMAL NOW  . Arthritis   . Cataracts, bilateral   . DDD (degenerative disc disease)   . DKA (diabetic ketoacidoses) ([place]) 05/21/2013   TYPE 2  . GERD (gastroesophageal reflux disease)   . Headache    SINUS  . High cholesterol   . Hypertension   . Neuropathy 2015   feet  . Obesity   . Osteopenia    Discharge Diagnoses:   Principal Problem:   Failed total knee arthroplasty ([place]) Active Problems:   Failed total right knee replacement ([place])  Estimated body mass index is 28.7 kg/m as calculated from the following:   Height as of this encounter: '5\' 3"'$  (1.6 m).   Weight as of this encounter: 73.5 kg.  Procedure:  Procedure(s) (LRB): Right knee polyethylene exchange (Right)   Consults: None  HPI: The patient is a 69 year old female who had a right total knee arthroplasty done approximately 2-3 years ago and came in for a 2nd opinion regarding pain and instability in the knee.  She has tried physical therapy, which has not  been beneficial.  She has had progressively worsening instability.  She does not have any apparent loosening of her metal components on x-ray.  She presents now for polyethylene versus total knee revision.  Laboratory Data: Admission on 07/22/2019, Discharged on 07/23/2019  Component Date Value Ref Range Status  . Sodium 07/22/2019 139  135 - 145 mmol/L Final  . Potassium 07/22/2019 4.0  3.5 - 5.1 mmol/L Final  . Chloride 07/22/2019 105  98 - 111 mmol/L Final  . CO2 07/22/2019 24  22 - 32 mmol/L Final  . Glucose, Bld 07/22/2019 109* 70 - 99  mg/dL Final   Glucose reference range applies only to samples taken after fasting for at least 8 hours.  . BUN 07/22/2019 28* 8 - 23 mg/dL Final  . Creatinine, Ser 07/22/2019 1.02* 0.44 - 1.00 mg/dL Final  . Calcium 07/22/2019 9.9  8.9 - 10.3 mg/dL Final  . GFR calc non Af Amer 07/22/2019 56* >60 mL/min Final  . GFR calc Af Amer 07/22/2019 >60  >60 mL/min Final  . Anion gap 07/22/2019 10  5 - 15 Final   Performed at [facility], [place] [address] [place], [place] [address]  . Glucose-Capillary 07/22/2019 105* 70 - 99 mg/dL Final   Glucose reference range applies only to samples taken after fasting for at least 8 hours.  . Comment 1 07/22/2019 Notify RN   Final  . Comment 2 07/22/2019 Document in Chart   Final  . Glucose-Capillary 07/22/2019 123* 70 - 99 mg/dL Final   Glucose reference range applies only to samples taken after fasting for at least 8 hours.  . WBC 07/23/2019 12.1* 4.0 - 10.5 K/uL Final  . RBC 07/23/2019 3.54* 3.87 - 5.11 MIL/uL Final  . Hemoglobin 07/23/2019 10.6* 12.0 - 15.0 g/dL Final  . HCT 07/23/2019 32.8* 36.0 - 46.0 % Final  . MCV 07/23/2019 92.7  80.0 - 100.0 fL Final  . MCH 07/23/2019 29.9  26.0 - 34.0 pg Final  . MCHC 07/23/2019 32.3  30.0 - 36.0 g/dL Final  .  RDW 07/23/2019 14.2  11.5 - 15.5 % Final  . Platelets 07/23/2019 133* 150 - 400 K/uL Final  . nRBC 07/23/2019 0.0  0.0 - 0.2 % Final   Performed at [facility], [place] [address], [place], [place] [address]  . Sodium 07/23/2019 137  135 - 145 mmol/L Final  . Potassium 07/23/2019 4.3  3.5 - 5.1 mmol/L Final  . Chloride 07/23/2019 105  98 - 111 mmol/L Final  . CO2 07/23/2019 25  22 - 32 mmol/L Final  . Glucose, Bld 07/23/2019 169* 70 - 99 mg/dL Final   Glucose reference range applies only to samples taken after fasting for at least 8 hours.  . BUN 07/23/2019 17  8 - 23 mg/dL Final  . Creatinine, Ser 07/23/2019 0.80  0.44 - 1.00 mg/dL Final  . Calcium 07/23/2019 8.9  8.9  - 10.3 mg/dL Final  . GFR calc non Af Amer 07/23/2019 >60  >60 mL/min Final  . GFR calc Af Amer 07/23/2019 >60  >60 mL/min Final  . Anion gap 07/23/2019 7  5 - 15 Final   Performed at [facility], [place] [address], [place], [place] [address]  Hospital Outpatient Visit on 07/18/2019  Component Date Value Ref Range Status  . SARS Coronavirus 2 07/18/2019 NEGATIVE  NEGATIVE Final   Comment: (NOTE) SARS-CoV-2 target nucleic acids are NOT DETECTED. The SARS-CoV-2 RNA is generally detectable in upper and lower respiratory specimens during the acute phase of infection. Negative results do not preclude SARS-CoV-2 infection, do not rule out co-infections with other pathogens, and should not be used as the sole basis for treatment or other patient management decisions. Negative results must be combined with clinical observations, patient history, and epidemiological information. The expected result is Negative. Fact Sheet for Patients: SugarRoll.be Fact Sheet for Healthcare Providers: https://www.woods-mathews.com/ This test is not yet approved or cleared by the [place] FDA and  has been authorized for detection and/or diagnosis of SARS-CoV-2 by FDA under an Emergency Use Authorization (EUA). This EUA will remain  in effect (meaning this test can be used) for the duration of the COVID-19 declaration under Section 56                          4(b)(1) of the Act, 21 U.S.C. section 360bbb-3(b)(1), unless the authorization is terminated or revoked sooner. Performed at [facility], [place] [address], [place], [place] [address]   Hospital Outpatient Visit on 07/15/2019  Component Date Value Ref Range Status  . aPTT 07/15/2019 29  24 - 36 seconds Final   Performed at [facility], [place] [address], [place], [place] [address]  . WBC 07/15/2019 4.9  4.0 - 10.5 K/uL Final  . RBC 07/15/2019 3.87  3.87 - 5.11 MIL/uL  Final  . Hemoglobin 07/15/2019 11.8* 12.0 - 15.0 g/dL Final  . HCT 07/15/2019 35.5* 36.0 - 46.0 % Final  . MCV 07/15/2019 91.7  80.0 - 100.0 fL Final  . MCH 07/15/2019 30.5  26.0 - 34.0 pg Final  . MCHC 07/15/2019 33.2  30.0 - 36.0 g/dL Final  . RDW 07/15/2019 13.9  11.5 - 15.5 % Final  . Platelets 07/15/2019 148* 150 - 400 K/uL Final  . nRBC 07/15/2019 0.0  0.0 - 0.2 % Final   Performed at [facility], [place] [address] [place],  [address]  . Sodium 07/15/2019 136  135 - 145 mmol/L Final  .  Potassium 07/15/2019 2.7* 3.5 - 5.1 mmol/L Final   Comment: REPEATED TO VERIFY CRITICAL RESULT CALLED TO, READ BACK BY AND VERIFIED WITH: WASHINGTON,V. RN AT 0347 07/15/19 MULLINS,T   . Chloride 07/15/2019 98  98 - 111 mmol/L Final  . CO2 07/15/2019 28  22 - 32 mmol/L Final  . Glucose, Bld 07/15/2019 94  70 - 99 mg/dL Final   Glucose reference range applies only to samples taken after fasting for at least 8 hours.  . BUN 07/15/2019 18  8 - 23 mg/dL Final  . Creatinine, Ser 07/15/2019 0.87  0.44 - 1.00 mg/dL Final  . Calcium 07/15/2019 9.5  8.9 - 10.3 mg/dL Final  . Total Protein 07/15/2019 7.3  6.5 - 8.1 g/dL Final  . Albumin 07/15/2019 4.7  3.5 - 5.0 g/dL Final  . AST 07/15/2019 17  15 - 41 U/L Final  . ALT 07/15/2019 17  0 - 44 U/L Final  . Alkaline Phosphatase 07/15/2019 47  38 - 126 U/L Final  . Total Bilirubin 07/15/2019 0.7  0.3 - 1.2 mg/dL Final  . GFR calc non Af Amer 07/15/2019 >60  >60 mL/min Final  . GFR calc Af Amer 07/15/2019 >60  >60 mL/min Final  . Anion gap 07/15/2019 10  5 - 15 Final   Performed at [facility], [place] [address] [place], [place] [address]  . Prothrombin Time 07/15/2019 12.1  11.4 - 15.2 seconds Final  . INR 07/15/2019 0.9  0.8 - 1.2 Final   Comment: (NOTE) INR goal varies based on device and disease states. Performed at [facility], [place] [address] [place], [place] [address]   . ABO/RH(D)  07/15/2019 O POS   Final  . Antibody Screen 07/15/2019 NEG   Final  . Sample Expiration 07/15/2019 07/25/2019,2359   Final  . Extend sample reason 07/15/2019    Final                   Value:NO TRANSFUSIONS OR PREGNANCY IN THE PAST 3 MONTHS Performed at [facility], [place] [address], [place], [place] [address]   . MRSA, PCR 07/15/2019 NEGATIVE  NEGATIVE Final  . Staphylococcus aureus 07/15/2019 POSITIVE* NEGATIVE Final   Comment: (NOTE) The Xpert SA Assay (FDA approved for NASAL specimens in patients 14 years of age and older), is one component of a comprehensive surveillance program. It is not intended to diagnose infection nor to guide or monitor treatment. Performed at [facility], [place] [address] [place], [place] [address]   . Hgb A1c MFr Bld 07/15/2019 5.1  4.8 - 5.6 % Final   Comment: (NOTE) Pre diabetes:          5.7%-6.4% Diabetes:              >6.4% Glycemic control for   <7.0% adults with diabetes   . Mean Plasma Glucose 07/15/2019 99.67  mg/dL Final   Performed at [place] [address], [place],  [address]     X-Rays:No results found.  EKG: Orders placed or performed during the hospital encounter of 06/17/17  . EKG 12 lead  . EKG 12 lead     Hospital Course: Connie Branch is a 69 y.o. who was admitted to [facility]. They were brought to the operating room on 07/22/2019 and underwent Procedure(s): Right knee polyethylene exchange.  Patient tolerated the procedure well and was later transferred to the recovery room and then to the orthopaedic  floor for postoperative care. They were given PO and IV analgesics for pain control following their surgery. They were given 24 hours of postoperative antibiotics of  Anti-infectives (From admission, onward)   Start     Dose/Rate Route Frequency Ordered Stop   07/22/19 2030  vancomycin (VANCOCIN) IVPB 1000 mg/200 mL premix     1,000 mg 200 mL/hr over 60  Minutes Intravenous Every 12 hours 07/22/19 1056 07/22/19 2113   07/22/19 0745  vancomycin (VANCOCIN) IVPB 1000 mg/200 mL premix     1,000 mg 200 mL/hr over 60 Minutes Intravenous  Once 07/22/19 0743 07/22/19 0900     and started on DVT prophylaxis in the form of Xarelto.   PT and OT were ordered for total joint protocol. Discharge planning consulted to help with postop disposition and equipment needs.  Patient had a good night on the evening of surgery. They started to get up OOB with therapy on POD #0. Pt was seen during rounds and was ready to go home pending progress with therapy. Hemovac drain was pulled without difficulty. She worked with therapy on POD #1 and was meeting her goals. Pt was discharged to home later that day in stable condition.  Diet: Diabetic diet Activity: WBAT Follow-up: in 2 weeks Disposition: Home with outpatient physical therapy at [facility] Discharged Condition: stable   Discharge Instructions    Call MD / Call 911   Complete by: As directed    If you experience chest pain or shortness of breath, CALL 911 and be transported to the hospital emergency room.  If you develope a fever above 101 F, pus (white drainage) or increased drainage or redness at the wound, or calf pain, call your surgeon's office.   Change dressing   Complete by: As directed    You will have an adhesive waterproof bandage underneath. Leave this in place until your first follow-up appointment.   Constipation Prevention   Complete by: As directed    Drink plenty of fluids.  Prune juice may be helpful.  You may use a stool softener, such as Colace (over the counter) 100 mg twice a day.  Use MiraLax (over the counter) for constipation as needed.   Diet - low sodium heart healthy   Complete by: As directed    Do not put a pillow under the knee. Place it under the heel.   Complete by: As directed    Driving restrictions   Complete by: As directed    No driving for two weeks   TED hose    Complete by: As directed    Use stockings (TED hose) for three weeks on both leg(s).  You may remove them at night for sleeping.   Weight bearing as tolerated   Complete by: As directed      Allergies as of 07/23/2019      Reactions   Erythromycin Anaphylaxis   Aspirin Nausea And Vomiting   Penicillins Other (See Comments)   Childhood allergy Has patient had a PCN reaction causing immediate rash, facial/tongue/throat swelling, SOB or lightheadedness with hypotension: Unknown Has patient had a PCN reaction causing severe rash involving mucus membranes or skin necrosis: Unknown Has patient had a PCN reaction that required hospitalization:Unknown Has patient had a PCN reaction occurring within the last 10 years:No If all of the above answers are "NO", then may proceed with Cephalosporin use.      Medication List    TAKE these medications   acetaminophen 500 MG tablet Commonly known  as: TYLENOL Take 1,000 mg by mouth every 6 (six) hours as needed (for pain.).   blood glucose meter kit and supplies Use as instructed   fluticasone 50 MCG/ACT nasal spray Commonly known as: FLONASE Place 1-2 sprays into both nostrils daily as needed for allergies.   gabapentin 100 MG capsule Commonly known as: NEURONTIN Take 2 capsules (200 mg total) by mouth 3 (three) times daily. Take 200 mg three times a day for two weeks following surgery.Then take 200 mg two times a day for two weeks. Then take 200 mg once a day for two weeks. Then discontinue.   hydrochlorothiazide 25 MG tablet Commonly known as: HYDRODIURIL Take 25 mg by mouth daily.   HYDROcodone-acetaminophen 5-325 MG tablet Commonly known as: NORCO/VICODIN Take 1-2 tablets by mouth every 6 (six) hours as needed for severe pain.   losartan 100 MG tablet Commonly known as: COZAAR Take 100 mg by mouth daily.   MAALOX MAX PO Take 5-10 mLs by mouth 3 (three) times daily as needed (for acid reflux/heartburn.).   metFORMIN 500 MG tablet  Commonly known as: GLUCOPHAGE Take 500 mg by mouth daily after supper.   methocarbamol 500 MG tablet Commonly known as: ROBAXIN Take 1 tablet (500 mg total) by mouth every 6 (six) hours as needed for muscle spasms.   omeprazole 40 MG capsule Commonly known as: PRILOSEC Take 40 mg by mouth daily.   rivaroxaban 10 MG Tabs tablet Commonly known as: XARELTO Take 1 tablet (10 mg total) by mouth daily with breakfast for 20 days.   simvastatin 40 MG tablet Commonly known as: ZOCOR Take 40 mg by mouth daily.   traMADol 50 MG tablet Commonly known as: ULTRAM Take 1-2 tablets (50-100 mg total) by mouth every 6 (six) hours as needed for moderate pain.   vitamin C 1000 MG tablet Take 1,000 mg by mouth daily.            Discharge Care Instructions  (From admission, onward)         Start     Ordered   07/23/19 0000  Weight bearing as tolerated     07/23/19 0732   07/23/19 0000  Change dressing    Comments: You will have an adhesive waterproof bandage underneath. Leave this in place until your first follow-up appointment.   07/23/19 0732         Follow-up Information    Gaynelle Arabian, MD. Schedule an appointment as soon as possible for a visit on 08/06/2019.   Specialty: Orthopedic Surgery Contact information: [address] [place] [address] [phone]           Signed: Theresa Duty, PA-C Orthopedic Surgery 07/24/2019, 8:08 AM

## 2019-12-03 ENCOUNTER — Other Ambulatory Visit: Payer: Self-pay

## 2019-12-03 ENCOUNTER — Emergency Department (HOSPITAL_COMMUNITY): Admission: EM | Admit: 2019-12-03 | Discharge: 2019-12-03 | Discharge status: Absent without leave | Payer: Medicare HMO

## 2019-12-07 ENCOUNTER — Other Ambulatory Visit: Payer: Self-pay | Admitting: Family Medicine

## 2019-12-07 DIAGNOSIS — M8580 Other specified disorders of bone density and structure, unspecified site: Secondary | ICD-10-CM

## 2019-12-07 DIAGNOSIS — Z1231 Encounter for screening mammogram for malignant neoplasm of breast: Secondary | ICD-10-CM

## 2020-03-10 ENCOUNTER — Ambulatory Visit
Admission: RE | Admit: 2020-03-10 | Discharge: 2020-03-10 | Disposition: A | Discharge status: Home / Self Care | Payer: Medicare HMO | Source: Ambulatory Visit | Attending: Family Medicine | Admitting: Family Medicine

## 2020-03-10 ENCOUNTER — Other Ambulatory Visit: Payer: Self-pay

## 2020-03-10 DIAGNOSIS — M8580 Other specified disorders of bone density and structure, unspecified site: Secondary | ICD-10-CM

## 2020-03-10 DIAGNOSIS — Z1231 Encounter for screening mammogram for malignant neoplasm of breast: Secondary | ICD-10-CM

## 2020-03-14 ENCOUNTER — Other Ambulatory Visit: Payer: Self-pay | Admitting: Family Medicine

## 2020-03-14 DIAGNOSIS — R928 Other abnormal and inconclusive findings on diagnostic imaging of breast: Secondary | ICD-10-CM

## 2020-04-08 ENCOUNTER — Ambulatory Visit
Admission: RE | Admit: 2020-04-08 | Discharge: 2020-04-08 | Disposition: A | Discharge status: Home / Self Care | Payer: Medicare HMO | Source: Ambulatory Visit | Attending: Family Medicine | Admitting: Family Medicine

## 2020-04-08 ENCOUNTER — Other Ambulatory Visit: Payer: Self-pay | Admitting: Family Medicine

## 2020-04-08 ENCOUNTER — Other Ambulatory Visit: Payer: Self-pay

## 2020-04-08 DIAGNOSIS — N6489 Other specified disorders of breast: Secondary | ICD-10-CM

## 2020-04-08 DIAGNOSIS — R928 Other abnormal and inconclusive findings on diagnostic imaging of breast: Secondary | ICD-10-CM

## 2020-04-08 NOTE — Patient Instructions (Signed)
DUE TO COVID-19 ONLY ONE VISITOR IS ALLOWED TO COME WITH YOU AND STAY IN THE WAITING ROOM ONLY DURING PRE OP AND PROCEDURE DAY OF SURGERY. THE 1 VISITOR  MAY VISIT WITH YOU AFTER SURGERY IN YOUR PRIVATE ROOM DURING VISITING HOURS ONLY!  YOU NEED TO HAVE A COVID 19 TEST ON_12/9______ @_______ , THIS TEST MUST BE DONE BEFORE SURGERY,  COVID TESTING SITE [address] [place] , IT IS ON THE RIGHT GOING OUT [address] APPROXIMATELY  2 MINUTES PAST [employer] ON THE RIGHT. ONCE YOUR COVID TEST IS COMPLETED,  PLEASE BEGIN THE QUARANTINE INSTRUCTIONS AS OUTLINED IN YOUR HANDOUT.                Connie Branch   Your procedure is scheduled on: 04/18/20   Report to [facility] Main  Entrance   Report to admitting at   6:55 AM     Call this number if you have problems the morning of surgery [phone]    BRUSH YOUR TEETH MORNING OF SURGERY AND RINSE YOUR MOUTH OUT, NO CHEWING GUM CANDY OR MINTS.   No food after midnight.    You may have clear liquid until 6:00 AM.    At 5:30 AM drink pre surgery drink.   Nothing by mouth after 6:00 AM.   Take these medicines the morning of surgery with A SIP OF WATER: Omeprazole  DO NOT TAKE ANY DIABETIC MEDICATIONS DAY OF YOUR SURGERY                               You may not have any metal on your body including hair pins and              piercings  Do not wear jewelry, make-up, lotions, powders or perfumes, deodorant             Do not wear nail polish on your fingernails.  Do not shave  48 hours prior to surgery.                Do not bring valuables to the hospital. [facility] IS NOT             RESPONSIBLE   FOR VALUABLES.  Contacts, dentures or bridgework may not be worn into surgery. .       Name and phone number of your driver:  Special Instructions: N/A              Please read over the following fact sheets you were  given: _____________________________________________________________________             [facility] - Preparing for Surgery Before surgery, you can play an important role.  Because skin is not sterile, your skin needs to be as free of germs as possible.   You can reduce the number of germs on your skin by washing with CHG (chlorahexidine gluconate) soap before surgery.   CHG is an antiseptic cleaner which kills germs and bonds with the skin to continue killing germs even after washing. Please DO NOT use if you have an allergy to CHG or antibacterial soaps.   If your skin becomes reddened/irritated stop using the CHG and inform your nurse when you arrive at Short Stay. Do not shave (including legs and underarms) for at least 48 hours prior to the first CHG shower.  Please follow these instructions carefully:  1.  Shower with CHG Soap  the night before surgery and the  morning of Surgery.  2.  If you choose to wash your hair, wash your hair first as usual with your  normal  shampoo.  3.  After you shampoo, rinse your hair and body thoroughly to remove the  shampoo.                                        4.  Use CHG as you would any other liquid soap.  You can apply chg directly  to the skin and wash                       Gently with a scrungie or clean washcloth.  5.  Apply the CHG Soap to your body ONLY FROM THE NECK DOWN.   Do not use on face/ open                           Wound or open sores. Avoid contact with eyes, ears mouth and genitals (private parts).                       Wash face,  Genitals (private parts) with your normal soap.             6.  Wash thoroughly, paying special attention to the area where your surgery  will be performed.  7.  Thoroughly rinse your body with warm water from the neck down.  8.  DO NOT shower/wash with your normal soap after using and rinsing off  the CHG Soap.             9.  Pat yourself dry with a clean towel.            10.  Wear clean  pajamas.            11.  Place clean sheets on your bed the night of your first shower and do not  sleep with pets. Day of Surgery : Do not apply any lotions/deodorants the morning of surgery.  Please wear clean clothes to the hospital/surgery center.  FAILURE TO FOLLOW THESE INSTRUCTIONS MAY RESULT IN THE CANCELLATION OF YOUR SURGERY PATIENT SIGNATURE_________________________________  NURSE SIGNATURE__________________________________  ________________________________________________________________________   Connie Branch  An incentive spirometer is a tool that can help keep your lungs clear and active. This tool measures how well you are filling your lungs with each breath. Taking long deep breaths may help reverse or decrease the chance of developing breathing (pulmonary) problems (especially infection) following:  A long period of time when you are unable to move or be active. BEFORE THE PROCEDURE   If the spirometer includes an indicator to show your best effort, your nurse or respiratory therapist will set it to a desired goal.  If possible, sit up straight or lean slightly forward. Try not to slouch.  Hold the incentive spirometer in an upright position. INSTRUCTIONS FOR USE  1. Sit on the edge of your bed if possible, or sit up as far as you can in bed or on a chair. 2. Hold the incentive spirometer in an upright position. 3. Breathe out normally. 4. Place the mouthpiece in your mouth and seal your lips tightly around it. 5. Breathe in slowly and as deeply as possible, raising the piston or the ball toward the  top of the column. 6. Hold your breath for 3-5 seconds or for as long as possible. Allow the piston or ball to fall to the bottom of the column. 7. Remove the mouthpiece from your mouth and breathe out normally. 8. Rest for a few seconds and repeat Steps 1 through 7 at least 10 times every 1-2 hours when you are awake. Take your time and take a few normal breaths  between deep breaths. 9. The spirometer may include an indicator to show your best effort. Use the indicator as a goal to work toward during each repetition. 10. After each set of 10 deep breaths, practice coughing to be sure your lungs are clear. If you have an incision (the cut made at the time of surgery), support your incision when coughing by placing a pillow or rolled up towels firmly against it. Once you are able to get out of bed, walk around indoors and cough well. You may stop using the incentive spirometer when instructed by your caregiver.  RISKS AND COMPLICATIONS  Take your time so you do not get dizzy or light-headed.  If you are in pain, you may need to take or ask for pain medication before doing incentive spirometry. It is harder to take a deep breath if you are having pain. AFTER USE  Rest and breathe slowly and easily.  It can be helpful to keep track of a log of your progress. Your caregiver can provide you with a simple table to help with this. If you are using the spirometer at home, follow these instructions: SEEK MEDICAL CARE IF:   You are having difficultly using the spirometer.  You have trouble using the spirometer as often as instructed.  Your pain medication is not giving enough relief while using the spirometer.  You develop fever of 100.5 F (38.1 C) or higher. SEEK IMMEDIATE MEDICAL CARE IF:   You cough up bloody sputum that had not been present before.  You develop fever of 102 F (38.9 C) or greater.  You develop worsening pain at or near the incision site. MAKE SURE YOU:   Understand these instructions.  Will watch your condition.  Will get help right away if you are not doing well or get worse. Document Released: 09/03/2006 Document Revised: 07/16/2011 Document Reviewed: 11/04/2006 [facility] Patient Information 2014 [place], [place].   ________________________________________________________________________

## 2020-04-11 ENCOUNTER — Encounter (HOSPITAL_COMMUNITY)
Admission: RE | Admit: 2020-04-11 | Discharge: 2020-04-11 | Disposition: A | Discharge status: Home / Self Care | Payer: Medicare HMO | Source: Ambulatory Visit | Attending: Family Medicine | Admitting: Family Medicine

## 2020-04-20 ENCOUNTER — Ambulatory Visit
Admission: RE | Admit: 2020-04-20 | Discharge: 2020-04-20 | Disposition: A | Discharge status: Home / Self Care | Payer: Medicare HMO | Source: Ambulatory Visit | Attending: Family Medicine | Admitting: Family Medicine

## 2020-04-20 ENCOUNTER — Other Ambulatory Visit: Payer: Self-pay

## 2020-04-20 DIAGNOSIS — N6489 Other specified disorders of breast: Secondary | ICD-10-CM

## 2020-04-20 HISTORY — PX: BREAST SURGERY: SHX581

## 2020-04-20 HISTORY — PX: BREAST EXCISIONAL BIOPSY: SUR124

## 2020-05-25 NOTE — Patient Instructions (Addendum)
DUE TO COVID-19 ONLY ONE VISITOR IS ALLOWED TO COME WITH YOU AND STAY IN THE WAITING ROOM ONLY DURING PRE OP AND PROCEDURE DAY OF SURGERY. THE 1 VISITOR  MAY VISIT WITH YOU AFTER SURGERY IN YOUR PRIVATE ROOM DURING VISITING HOURS ONLY!  YOU NEED TO HAVE A COVID 19 TEST ON_1/27______ @_11 :35______, THIS TEST MUST BE DONE BEFORE SURGERY,  COVID TESTING SITE [address] [place] , IT IS ON THE RIGHT GOING OUT [address] APPROXIMATELY  2 MINUTES PAST [employer] ON THE RIGHT. ONCE YOUR COVID TEST IS COMPLETED,  PLEASE BEGIN THE QUARANTINE INSTRUCTIONS AS OUTLINED IN YOUR HANDOUT.                Connie Branch    Your procedure is scheduled on: 06/06/20   Report to [facility] Main  Entrance   Report to admitting a t     5:50 AM     Call this number if you have problems the morning of surgery [phone]   . BRUSH YOUR TEETH MORNING OF SURGERY AND RINSE YOUR MOUTH OUT, NO CHEWING GUM CANDY OR MINTS.   No food after midnight  .  You may have clear liquid until 5:00 AM.    At 4:30 AM drink pre surgery drink.   Nothing by mouth after 5:00 AM.   Take these medicines the morning of surgery with A SIP OF WATER: Omeprrazole      How to Manage Your Diabetes Before and After Surgery  Why is it important to control my blood sugar before and after surgery? . Improving blood sugar levels before and after surgery helps healing and can limit problems. . A way of improving blood sugar control is eating a healthy diet by: o  Eating less sugar and carbohydrates o  Increasing activity/exercise o  Talking with your doctor about reaching your blood sugar goals . High blood sugars (greater than 180 mg/dL) can raise your risk of infections and slow your recovery, so you will need to focus on controlling your diabetes during the weeks before surgery. . Make sure that the doctor who takes care of your diabetes knows about your planned surgery including the  date and location.  How do I manage my blood sugar before surgery? . Check your blood sugar at least 4 times a day, starting 2 days before surgery, to make sure that the level is not too high or low. o Check your blood sugar the morning of your surgery when you wake up and every 2 hours until you get to the Short Stay unit. . If your blood sugar is less than 70 mg/dL, you will need to treat for low blood sugar: o Do not take insulin. o Treat a low blood sugar (less than 70 mg/dL) with  cup of clear juice (cranberry or apple), 4 glucose tablets, OR glucose gel. o Recheck blood sugar in 15 minutes after treatment (to make sure it is greater than 70 mg/dL). If your blood sugar is not greater than 70 mg/dL on recheck, call [phone] for further instructions. . Report your blood sugar to the short stay nurse when you get to Short Stay.  . If you are admitted to the hospital after surgery: o Your blood sugar will be checked by the staff and you will probably be given insulin after surgery (instead of oral diabetes medicines) to make sure you have good blood sugar levels. o The goal for blood sugar control after surgery is  80-180 mg/dL.   WHAT DO I DO ABOUT MY DIABETES MEDICATION?  Marland Kitchen Do not take oral diabetes medicines (pills) the morning of surgery.                    You may not have any metal on your body including hair pins and              piercings  Do not wear jewelry, make-up, lotions, powders or perfumes, deodorant             Do not wear nail polish on your fingernails.  Do not shave  48 hours prior to surgery.               Do not bring valuables to the hospital. [facility] IS NOT             RESPONSIBLE   FOR VALUABLES.  Contacts, dentures or bridgework may not be worn into surgery.      Patients discharged the day of surgery will not be allowed to drive home.   IF YOU ARE HAVING SURGERY AND GOING HOME THE SAME DAY, YOU MUST HAVE AN ADULT TO DRIVE YOU HOME AND BE WITH YOU  FOR 24 HOURS.  YOU MAY GO HOME BY TAXI OR UBER OR ORTHERWISE, BUT AN ADULT MUST ACCOMPANY YOU HOME AND STAY WITH YOU FOR 24 HOURS.  Name and phone number of your driver:  Special Instructions: N/A              Please read over the following fact sheets you were given: _____________________________________________________________________             [facility] - Preparing for Surgery Before surgery, you can play an important role.   Because skin is not sterile, your skin needs to be as free of germs as possible.   You can reduce the number of germs on your skin by washing with CHG (chlorahexidine gluconate) soap before surgery.   CHG is an antiseptic cleaner which kills germs and bonds with the skin to continue killing germs even after washing. Please DO NOT use if you have an allergy to CHG or antibacterial soaps.   If your skin becomes reddened/irritated stop using the CHG and inform your nurse when you arrive at Short Stay. Do not shave (including legs and underarms) for at least 48 hours prior to the first CHG shower.    Please follow these instructions carefully:  1.  Shower with CHG Soap the night before surgery and the  morning of Surgery.  2.  If you choose to wash your hair, wash your hair first as usual with your  normal  shampoo.  3.  After you shampoo, rinse your hair and body thoroughly to remove the  shampoo.                                        4.  Use CHG as you would any other liquid soap.  You can apply chg directly  to the skin and wash                       Gently with a scrungie or clean washcloth.  5.  Apply the CHG Soap to your body ONLY FROM THE NECK DOWN.   Do not use on face/ open  Wound or open sores. Avoid contact with eyes, ears mouth and genitals (private parts).                       Wash face,  Genitals (private parts) with your normal soap.             6.  Wash thoroughly, paying special attention to the area where your surgery   will be performed.  7.  Thoroughly rinse your body with warm water from the neck down.  8.  DO NOT shower/wash with your normal soap after using and rinsing off  the CHG Soap.             9.  Pat yourself dry with a clean towel.            10.  Wear clean pajamas.            11.  Place clean sheets on your bed the night of your first shower and do not  sleep with pets. Day of Surgery : Do not apply any lotions/deodorants the morning of surgery.  Please wear clean clothes to the hospital/surgery center.  FAILURE TO FOLLOW THESE INSTRUCTIONS MAY RESULT IN THE CANCELLATION OF YOUR SURGERY PATIENT SIGNATURE_________________________________  NURSE SIGNATURE__________________________________  ________________________________________________________________________   Connie Branch  An incentive spirometer is a tool that can help keep your lungs clear and active. This tool measures how well you are filling your lungs with each breath. Taking long deep breaths may help reverse or decrease the chance of developing breathing (pulmonary) problems (especially infection) following:  A long period of time when you are unable to move or be active. BEFORE THE PROCEDURE   If the spirometer includes an indicator to show your best effort, your nurse or respiratory therapist will set it to a desired goal.  If possible, sit up straight or lean slightly forward. Try not to slouch.  Hold the incentive spirometer in an upright position. INSTRUCTIONS FOR USE  1. Sit on the edge of your bed if possible, or sit up as far as you can in bed or on a chair. 2. Hold the incentive spirometer in an upright position. 3. Breathe out normally. 4. Place the mouthpiece in your mouth and seal your lips tightly around it. 5. Breathe in slowly and as deeply as possible, raising the piston or the ball toward the top of the column. 6. Hold your breath for 3-5 seconds or for as long as possible. Allow the piston or ball  to fall to the bottom of the column. 7. Remove the mouthpiece from your mouth and breathe out normally. 8. Rest for a few seconds and repeat Steps 1 through 7 at least 10 times every 1-2 hours when you are awake. Take your time and take a few normal breaths between deep breaths. 9. The spirometer may include an indicator to show your best effort. Use the indicator as a goal to work toward during each repetition. 10. After each set of 10 deep breaths, practice coughing to be sure your lungs are clear. If you have an incision (the cut made at the time of surgery), support your incision when coughing by placing a pillow or rolled up towels firmly against it. Once you are able to get out of bed, walk around indoors and cough well. You may stop using the incentive spirometer when instructed by your caregiver.  RISKS AND COMPLICATIONS  Take your time so you do not get dizzy or  light-headed.  If you are in pain, you may need to take or ask for pain medication before doing incentive spirometry. It is harder to take a deep breath if you are having pain. AFTER USE  Rest and breathe slowly and easily.  It can be helpful to keep track of a log of your progress. Your caregiver can provide you with a simple table to help with this. If you are using the spirometer at home, follow these instructions: [place] IF:   You are having difficultly using the spirometer.  You have trouble using the spirometer as often as instructed.  Your pain medication is not giving enough relief while using the spirometer.  You develop fever of 100.5 F (38.1 C) or higher. SEEK IMMEDIATE MEDICAL CARE IF:   You cough up bloody sputum that had not been present before.  You develop fever of 102 F (38.9 C) or greater.  You develop worsening pain at or near the incision site. MAKE SURE YOU:   Understand these instructions.  Will watch your condition.  Will get help right away if you are not doing well or get  worse. Document Released: 09/03/2006 Document Revised: 07/16/2011 Document Reviewed: 11/04/2006 [facility] Patient Information 2014 [place], [place].   ________________________________________________________________________

## 2020-05-26 ENCOUNTER — Other Ambulatory Visit: Payer: Self-pay

## 2020-05-26 ENCOUNTER — Encounter (HOSPITAL_COMMUNITY)
Admission: RE | Admit: 2020-05-26 | Discharge: 2020-05-26 | Disposition: A | Discharge status: Home / Self Care | Payer: Medicare HMO | Source: Ambulatory Visit | Attending: Orthopedic Surgery | Admitting: Orthopedic Surgery

## 2020-05-26 ENCOUNTER — Encounter (HOSPITAL_COMMUNITY): Payer: Self-pay

## 2020-05-26 DIAGNOSIS — E119 Type 2 diabetes mellitus without complications: Secondary | ICD-10-CM | POA: Insufficient documentation

## 2020-05-26 DIAGNOSIS — Z01818 Encounter for other preprocedural examination: Secondary | ICD-10-CM | POA: Insufficient documentation

## 2020-05-26 HISTORY — DX: Anxiety disorder, unspecified: F41.9

## 2020-05-26 HISTORY — DX: Personal history of other mental and behavioral disorders: Z86.59

## 2020-05-26 LAB — SURGICAL PCR SCREEN
MRSA, PCR: NEGATIVE
Staphylococcus aureus: NEGATIVE

## 2020-05-26 LAB — COMPREHENSIVE METABOLIC PANEL
ALT: 14 U/L (ref 0–44)
AST: 16 U/L (ref 15–41)
Albumin: 4.6 g/dL (ref 3.5–5.0)
Alkaline Phosphatase: 61 U/L (ref 38–126)
Anion gap: 10 (ref 5–15)
BUN: 22 mg/dL (ref 8–23)
CO2: 27 mmol/L (ref 22–32)
Calcium: 9.4 mg/dL (ref 8.9–10.3)
Chloride: 100 mmol/L (ref 98–111)
Creatinine, Ser: 0.82 mg/dL (ref 0.44–1.00)
GFR, Estimated: >60 mL/min (ref >60)
Glucose, Bld: 97 mg/dL (ref 70–99)
Potassium: 3.6 mmol/L (ref 3.5–5.1)
Sodium: 137 mmol/L (ref 135–145)
Total Bilirubin: 0.7 mg/dL (ref 0.3–1.2)
Total Protein: 7.4 g/dL (ref 6.5–8.1)

## 2020-05-26 LAB — CBC
HCT: 36.2 % (ref 36.0–46.0)
Hemoglobin: 11.9 g/dL — ABNORMAL LOW (ref 12.0–15.0)
MCH: 29.9 pg (ref 26.0–34.0)
MCHC: 32.9 g/dL (ref 30.0–36.0)
MCV: 91 fL (ref 80.0–100.0)
Platelets: 130 10*3/uL — ABNORMAL LOW (ref 150–400)
RBC: 3.98 MIL/uL (ref 3.87–5.11)
RDW: 14.5 % (ref 11.5–15.5)
WBC: 5.5 10*3/uL (ref 4.0–10.5)
nRBC: 0 % (ref 0.0–0.2)

## 2020-05-26 LAB — PROTIME-INR
INR: 0.9 (ref 0.8–1.2)
Prothrombin Time: 11.9 seconds (ref 11.4–15.2)

## 2020-05-26 LAB — TYPE AND SCREEN
ABO/RH(D): O POS
Antibody Screen: NEGATIVE

## 2020-05-26 LAB — GLUCOSE, CAPILLARY: Glucose-Capillary: 90 mg/dL (ref 70–99)

## 2020-05-26 LAB — APTT: aPTT: 29 seconds (ref 24–36)

## 2020-05-26 NOTE — Progress Notes (Signed)
COVID Vaccine Completed:Yes Date COVID Vaccine completed:07/07/19-Booster 02/08/20 COVID vaccine manufacturer: Pfizer     PCP - Dr. Stevie Kern Cardiologist - none  Chest x-ray - no EKG - 04/04/20-Chart Stress Test - no ECHO - no Cardiac Cath - no Pacemaker/ICD device last checked:NA  Sleep Study - no CPAP -   Fasting Blood Sugar - 90-140 Checks Blood Sugar QD_____ times a day  Blood Thinner Instructions:no Aspirin Instructions: Last Dose:  Anesthesia review:   Patient denies shortness of breath, fever, cough and chest pain at PAT appointment yes  Patient verbalized understanding of instructions that were given to them at the PAT appointment. Patient was also instructed that they will need to review over the PAT instructions again at home before surgery. Yes. Pt reports that she has no SOB with activities. She did say that she has panic attacks and is claustrophobic.

## 2020-05-27 LAB — HEMOGLOBIN A1C
Hgb A1c MFr Bld: 4.9 % (ref 4.8–5.6)
Mean Plasma Glucose: 93.93 mg/dL

## 2020-06-02 ENCOUNTER — Other Ambulatory Visit (HOSPITAL_COMMUNITY)
Admission: RE | Admit: 2020-06-02 | Discharge: 2020-06-02 | Disposition: A | Discharge status: Home / Self Care | Payer: Medicare HMO | Source: Ambulatory Visit | Attending: Orthopedic Surgery | Admitting: Orthopedic Surgery

## 2020-06-02 DIAGNOSIS — Z01812 Encounter for preprocedural laboratory examination: Secondary | ICD-10-CM | POA: Insufficient documentation

## 2020-06-02 DIAGNOSIS — Z20822 Contact with and (suspected) exposure to covid-19: Secondary | ICD-10-CM | POA: Diagnosis not present

## 2020-06-03 LAB — SARS CORONAVIRUS 2 (TAT 6-24 HRS): SARS Coronavirus 2: NEGATIVE

## 2020-07-01 ENCOUNTER — Telehealth: Payer: Self-pay | Admitting: Hematology and Oncology

## 2020-07-01 NOTE — Telephone Encounter (Signed)
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 Connie Branch has been cld and scheduled to see Dr. Al Pimple on 3/9 at 10:40am. Pt aware to arrive 20 minutes early.

## 2020-07-07 NOTE — Patient Instructions (Addendum)
DUE TO COVID-19 ONLY ONE VISITOR IS ALLOWED TO COME WITH YOU AND STAY IN THE WAITING ROOM ONLY DURING PRE OP AND PROCEDURE DAY OF SURGERY. THE 1 VISITOR  MAY VISIT WITH YOU AFTER SURGERY IN YOUR PRIVATE ROOM DURING VISITING HOURS ONLY!  YOU NEED TO HAVE A COVID 19 TEST ON_3/10______ @_1 :00 PM______, THIS TEST MUST BE DONE BEFORE SURGERY,  COVID TESTING SITE [address] [place] , IT IS ON THE RIGHT GOING OUT [address] APPROXIMATELY  2 MINUTES PAST [employer] ON THE RIGHT. ONCE YOUR COVID TEST IS COMPLETED,  PLEASE BEGIN THE QUARANTINE INSTRUCTIONS AS OUTLINED IN YOUR HANDOUT.                Connie Branch    Your procedure is scheduled on: 07/18/20   Report to [facility] Main  Entrance   Report to admitting at  5:50 AM     Call this number if you have problems the morning of surgery [phone]    . BRUSH YOUR TEETH MORNING OF SURGERY AND RINSE YOUR MOUTH OUT, NO CHEWING GUM CANDY OR MINTS.   No food after midnight.    You may have clear liquid until 4:30 AM.    Nothing by mouth after 4:30 AM   Take these medicines the morning of surgery with A SIP OF WATER: Omeprazole  DO NOT TAKE ANY DIABETIC MEDICATIONS DAY OF YOUR SURGERY     How to Manage Your Diabetes Before and After Surgery  Why is it important to control my blood sugar before and after surgery? . Improving blood sugar levels before and after surgery helps healing and can limit problems. . A way of improving blood sugar control is eating a healthy diet by: o  Eating less sugar and carbohydrates o  Increasing activity/exercise o  Talking with your doctor about reaching your blood sugar goals . High blood sugars (greater than 180 mg/dL) can raise your risk of infections and slow your recovery, so you will need to focus on controlling your diabetes during the weeks before surgery. . Make sure that the doctor who takes care of your diabetes knows about your planned surgery  including the date and location.  How do I manage my blood sugar before surgery? . Check your blood sugar at least 4 times a day, starting 2 days before surgery, to make sure that the level is not too high or low. o Check your blood sugar the morning of your surgery when you wake up and every 2 hours until you get to the Short Stay unit. . If your blood sugar is less than 70 mg/dL, you will need to treat for low blood sugar: o Do not take insulin. o Treat a low blood sugar (less than 70 mg/dL) with  cup of clear juice (cranberry or apple), 4 glucose tablets, OR glucose gel. o Recheck blood sugar in 15 minutes after treatment (to make sure it is greater than 70 mg/dL). If your blood sugar is not greater than 70 mg/dL on recheck, call [phone] for further instructions. . Report your blood sugar to the short stay nurse when you get to Short Stay.  . If you are admitted to the hospital after surgery: o Your blood sugar will be checked by the staff and you will probably be given insulin after surgery (instead of oral diabetes medicines) to make sure you have good blood sugar levels. o The goal for blood sugar control after surgery is 80-180  mg/dL.   WHAT DO I DO ABOUT MY DIABETES MEDICATION?  Marland Kitchen Do not take oral diabetes medicines (pills) the morning of surgery.                             You may not have any metal on your body including hair pins and              piercings  Do not wear jewelry, make-up, lotions, powders or perfumes, deodorant             Do not wear nail polish on your fingernails.  Do not shave  48 hours prior to surgery.              Do not bring valuables to the hospital. [facility] IS NOT             RESPONSIBLE   FOR VALUABLES.  Contacts, dentures or bridgework may not be worn into surgery.                 [facility] - Preparing for Surgery Before surgery, you can play an important role.  Because skin is not sterile, your skin needs to be as free of germs  as possible.  You can reduce the number of germs on your skin by washing with CHG (chlorahexidine gluconate) soap before surgery.  CHG is an antiseptic cleaner which kills germs and bonds with the skin to continue killing germs even after washing. Please DO NOT use if you have an allergy to CHG or antibacterial soaps.  If your skin becomes reddened/irritated stop using the CHG and inform your nurse when you arrive at Short Stay. Do not shave (including legs and underarms) for at least 48 hours prior to the first CHG shower.   Please follow these instructions carefully:  1.  Shower with CHG Soap the night before surgery and the  morning of Surgery.  2.  If you choose to wash your hair, wash your hair first as usual with your  normal  shampoo.  3.  After you shampoo, rinse your hair and body thoroughly to remove the  shampoo.                                        4.  Use CHG as you would any other liquid soap.  You can apply chg directly  to the skin and wash                       Gently with a scrungie or clean washcloth.  5.  Apply the CHG Soap to your body ONLY FROM THE NECK DOWN.   Do not use on face/ open                           Wound or open sores. Avoid contact with eyes, ears mouth and genitals (private parts).                       Wash face,  Genitals (private parts) with your normal soap.             6.  Wash thoroughly, paying special attention to the area where your surgery  will be performed.  7.  Thoroughly rinse your body with warm  water from the neck down.  8.  DO NOT shower/wash with your normal soap after using and rinsing off  the CHG Soap.             9.  Pat yourself dry with a clean towel.            10.  Wear clean pajamas.            11.  Place clean sheets on your bed the night of your first shower and do not  sleep with pets. Day of Surgery : Do not apply any lotions/deodorants the morning of surgery.  Please wear clean clothes to the hospital/surgery center.  FAILURE  TO FOLLOW THESE INSTRUCTIONS MAY RESULT IN THE CANCELLATION OF YOUR SURGERY PATIENT SIGNATURE_________________________________  NURSE SIGNATURE__________________________________  ________________________________________________________________________   Connie Branch  An incentive spirometer is a tool that can help keep your lungs clear and active. This tool measures how well you are filling your lungs with each breath. Taking long deep breaths may help reverse or decrease the chance of developing breathing (pulmonary) problems (especially infection) following:  A long period of time when you are unable to move or be active. BEFORE THE PROCEDURE   If the spirometer includes an indicator to show your best effort, your nurse or respiratory therapist will set it to a desired goal.  If possible, sit up straight or lean slightly forward. Try not to slouch.  Hold the incentive spirometer in an upright position. INSTRUCTIONS FOR USE  1. Sit on the edge of your bed if possible, or sit up as far as you can in bed or on a chair. 2. Hold the incentive spirometer in an upright position. 3. Breathe out normally. 4. Place the mouthpiece in your mouth and seal your lips tightly around it. 5. Breathe in slowly and as deeply as possible, raising the piston or the ball toward the top of the column. 6. Hold your breath for 3-5 seconds or for as long as possible. Allow the piston or ball to fall to the bottom of the column. 7. Remove the mouthpiece from your mouth and breathe out normally. 8. Rest for a few seconds and repeat Steps 1 through 7 at least 10 times every 1-2 hours when you are awake. Take your time and take a few normal breaths between deep breaths. 9. The spirometer may include an indicator to show your best effort. Use the indicator as a goal to work toward during each repetition. 10. After each set of 10 deep breaths, practice coughing to be sure your lungs are clear. If you have an  incision (the cut made at the time of surgery), support your incision when coughing by placing a pillow or rolled up towels firmly against it. Once you are able to get out of bed, walk around indoors and cough well. You may stop using the incentive spirometer when instructed by your caregiver.  RISKS AND COMPLICATIONS  Take your time so you do not get dizzy or light-headed.  If you are in pain, you may need to take or ask for pain medication before doing incentive spirometry. It is harder to take a deep breath if you are having pain. AFTER USE  Rest and breathe slowly and easily.  It can be helpful to keep track of a log of your progress. Your caregiver can provide you with a simple table to help with this. If you are using the spirometer at home, follow these instructions: [facility]  IF:   You are having difficultly using the spirometer.  You have trouble using the spirometer as often as instructed.  Your pain medication is not giving enough relief while using the spirometer.  You develop fever of 100.5 F (38.1 C) or higher. SEEK IMMEDIATE MEDICAL CARE IF:   You cough up bloody sputum that had not been present before.  You develop fever of 102 F (38.9 C) or greater.  You develop worsening pain at or near the incision site. MAKE SURE YOU:   Understand these instructions.  Will watch your condition.  Will get help right away if you are not doing well or get worse. Document Released: 09/03/2006 Document Revised: 07/16/2011 Document Reviewed: 11/04/2006 [facility] Patient Information 2014 [place], [place].   ________________________________________________________________________

## 2020-07-08 ENCOUNTER — Encounter (HOSPITAL_COMMUNITY): Payer: Self-pay

## 2020-07-08 ENCOUNTER — Other Ambulatory Visit: Payer: Self-pay

## 2020-07-08 ENCOUNTER — Encounter (HOSPITAL_COMMUNITY)
Admission: RE | Admit: 2020-07-08 | Discharge: 2020-07-08 | Disposition: A | Discharge status: Home / Self Care | Payer: Medicare HMO | Source: Ambulatory Visit | Attending: Orthopedic Surgery | Admitting: Orthopedic Surgery

## 2020-07-08 DIAGNOSIS — Z01812 Encounter for preprocedural laboratory examination: Secondary | ICD-10-CM | POA: Insufficient documentation

## 2020-07-08 LAB — CBC
HCT: 37.9 % (ref 36.0–46.0)
Hemoglobin: 12.5 g/dL (ref 12.0–15.0)
MCH: 30 pg (ref 26.0–34.0)
MCHC: 33 g/dL (ref 30.0–36.0)
MCV: 91.1 fL (ref 80.0–100.0)
Platelets: 123 10*3/uL — ABNORMAL LOW (ref 150–400)
RBC: 4.16 MIL/uL (ref 3.87–5.11)
RDW: 14.1 % (ref 11.5–15.5)
WBC: 4.8 10*3/uL (ref 4.0–10.5)
nRBC: 0 % (ref 0.0–0.2)

## 2020-07-08 LAB — COMPREHENSIVE METABOLIC PANEL
ALT: 14 U/L (ref 0–44)
AST: 17 U/L (ref 15–41)
Albumin: 4.5 g/dL (ref 3.5–5.0)
Alkaline Phosphatase: 61 U/L (ref 38–126)
Anion gap: 7 (ref 5–15)
BUN: 27 mg/dL — ABNORMAL HIGH (ref 8–23)
CO2: 28 mmol/L (ref 22–32)
Calcium: 9.4 mg/dL (ref 8.9–10.3)
Chloride: 103 mmol/L (ref 98–111)
Creatinine, Ser: 0.78 mg/dL (ref 0.44–1.00)
GFR, Estimated: >60 mL/min (ref >60)
Glucose, Bld: 91 mg/dL (ref 70–99)
Potassium: 3.5 mmol/L (ref 3.5–5.1)
Sodium: 138 mmol/L (ref 135–145)
Total Bilirubin: 0.6 mg/dL (ref 0.3–1.2)
Total Protein: 7.6 g/dL (ref 6.5–8.1)

## 2020-07-08 LAB — GLUCOSE, CAPILLARY: Glucose-Capillary: 90 mg/dL (ref 70–99)

## 2020-07-08 LAB — SURGICAL PCR SCREEN
MRSA, PCR: NEGATIVE
Staphylococcus aureus: NEGATIVE

## 2020-07-08 LAB — APTT: aPTT: 30 seconds (ref 24–36)

## 2020-07-08 LAB — PROTIME-INR
INR: 0.9 (ref 0.8–1.2)
Prothrombin Time: 11.4 seconds (ref 11.4–15.2)

## 2020-07-08 NOTE — Progress Notes (Signed)
COVID Vaccine Completed:Yes Date COVID Vaccine completed:07/07/19-booster 02/08/20 COVID vaccine manufacturer: Pfizer     PCP - Sigmund Hazel Cardiologist - none  Chest x-ray - no EKG - 05/26/20-epic and chart Stress Test - no ECHO - no Cardiac Cath - no Pacemaker/ICD device last checked:NA  Sleep Study - no CPAP -   Fasting Blood Sugar - 90-120 Checks Blood Sugar _BID____ times a day  Blood Thinner Instructions:no Aspirin Instructions: Last Dose:  Anesthesia review:   Patient denies shortness of breath, fever, cough and chest pain at PAT appointment yes  Patient verbalized understanding of instructions that were given to them at the PAT appointment. Patient was also instructed that they will need to review over the PAT instructions again at home before surgery.yes Pt climbs one flight of stairs, does housework and ADLs without SOB. She is using a walker at home

## 2020-07-13 ENCOUNTER — Inpatient Hospital Stay: Payer: Medicare HMO | Attending: Hematology and Oncology | Admitting: Hematology and Oncology

## 2020-07-13 ENCOUNTER — Inpatient Hospital Stay: Payer: Medicare HMO

## 2020-07-13 ENCOUNTER — Encounter: Payer: Self-pay | Admitting: Hematology and Oncology

## 2020-07-13 ENCOUNTER — Telehealth: Payer: Self-pay | Admitting: Hematology and Oncology

## 2020-07-13 ENCOUNTER — Other Ambulatory Visit: Payer: Self-pay

## 2020-07-13 VITALS — BP 135/70 | HR 76 | Temp 97.0°F | Resp 18 | Ht 63.0 in | Wt 156.8 lb

## 2020-07-13 DIAGNOSIS — N6092 Unspecified benign mammary dysplasia of left breast: Secondary | ICD-10-CM

## 2020-07-13 DIAGNOSIS — Z87891 Personal history of nicotine dependence: Secondary | ICD-10-CM | POA: Diagnosis not present

## 2020-07-13 DIAGNOSIS — Z9189 Other specified personal risk factors, not elsewhere classified: Secondary | ICD-10-CM

## 2020-07-13 NOTE — Telephone Encounter (Signed)
Scheduled appointments per 3/9 los. Spoke to patient who is aware of appointment date and time. Gave patient calendar print out.

## 2020-07-13 NOTE — Progress Notes (Signed)
Connie Branch NOTE  Patient Care Team: Kathyrn Lass, MD as PCP - General (Family Medicine)  CHIEF COMPLAINTS/PURPOSE OF CONSULTATION:  Atypical lobular hyperplasia  ASSESSMENT & PLAN:  No problem-specific Assessment & Plan notes found for this encounter.  No orders of the defined types were placed in this encounter.  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 We have discussed that atypical lobular hyperplasia is a 3 precancerous condition and increases the risk of breast cancer by 3-5 times and hence she was referred to high risk breast cancer clinic for consideration of chemoprevention with tamoxifen or aromatase inhibitors.  She is understandably very anxious about having to take the medication for 5 years.  She was really nervous about the fact that atypical lobular hyperplasia can increase her risk of breast cancer.   We have discussed about tamoxifen as mechanism of action adverse effects with tamoxifen including but not limited to postmenopausal symptoms, small risk of DVT/PE but the benefit would be an increase in bone density.  There is a questionable risk of cardiovascular events with tamoxifen or anastrozole. Since she had hysterectomy, there is no concern for endometrial complications from tamoxifen. We discussed mechanism of action with Arimidex, adverse effects from Arimidex including postmenopausal symptoms, no risk with blood clots but worsening bone density that can be noted with aromatase inhibitors.  She tells me that she has a baseline diagnosis of osteopenia and she is worried that by taking one medication she will have a risk of blood clots whereas if she chooses to go with other medication there is a small risk of osteopenia or osteoporosis.  Have discussed that the risk of DVT/PE with tamoxifen is in order of 1 to 2% and  most people do not have any increased risk of DVT/PE.     I also discussed that by taking these medications it is not 100% guarantee that she will never have breast cancer. I have advised her to proceed with knee replacement, return to clinic to see me back in May 2022 and discuss further since she is very anxious at this time.  I printed material about Arimidex and given it to her since she is reluctant to try tamoxifen. Thank you for consulting [place] in the care of this patient.  Please do not hesitate to contact us with any additional questions or concerns.  HISTORY OF PRESENTING ILLNESS:   Connie Branch 70 y.o. female is here because of atypical lobular hyperplasia.  Connie Branch is a very pleasant 70 year old female patient with past medical history significant for diabetes, peripheral neuropathy, osteoarthritis, hypertension referred to high-risk breast cancer clinic given her recent biopsy of left breast which demonstrated atypical lobular hyperplasia. Patient tells me that she has no clear understanding about why she was referred to high risk breast cancer clinic. She was wondering why she needs to take any additional medication if she was going to get the breast surgery. She was very anxious about having to take medication for 5 years.  She almost went into a panic episode when she learned that atypical lobular hyperplasia can be a bleed precancerous condition.  She denies any family history of breast cancer except for a second-degree relative who had breast cancer.  No other cancers run in the family.  She is nulliparous, denies use of any hormone replacement therapy, used an IUD many years  ago called Livi's loop  She is worried that she has an upcoming knee replacement and one of the antiestrogen therapy medications can cause increased risk of blood clots and she is not so sure if she wants to take the medication for 5 years. Rest of the pertinent 10 point ROS as mentioned below and  negative.  REVIEW OF SYSTEMS:   Constitutional: Denies fevers, chills or abnormal night sweats Eyes: Denies blurriness of vision, double vision or watery eyes Ears, nose, mouth, throat, and face: Denies mucositis or sore throat Respiratory: Denies cough, dyspnea or wheezes Cardiovascular: Denies palpitation, chest discomfort or lower extremity swelling Gastrointestinal:  Denies nausea, heartburn or change in bowel habits Skin: Denies abnormal skin rashes Lymphatics: Denies new lymphadenopathy or easy bruising Neurological:Denies numbness, tingling or new weaknesses Behavioral/Psych: Mood is stable, no new changes  All other systems were reviewed with the patient and are negative.  MEDICAL HISTORY:  Past Medical History:  Diagnosis Date  . Anxiety    panic attacks  . ARF (acute renal failure) secondary to dehydration from osmotic diuresis 05/21/2013   KIDNEY FUNCTION NORMAL NOW  . Arthritis   . Cataracts, bilateral   . DDD (degenerative disc disease)   . 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 GERD (gastroesophageal reflux disease)   . Headache    SINUS  . High cholesterol   . History of claustrophobia   . Hypertension   . Neuropathy 2015   feet  . Obesity   . Osteopenia     SURGICAL HISTORY: Past Surgical History:  Procedure Laterality Date  . ABDOMINAL HYSTERECTOMY     COMPLETE  . BREAST SURGERY Left 04/20/2020   BX  . KNEE JOINT MANIPULATION Right   . OVARIAN CYST AND OVARY REMOVED  YRS AGO  . TOTAL KNEE ARTHROPLASTY Right 06/21/2017   Procedure: RIGHT TOTAL KNEE ARTHROPLASTY;  Surgeon: Dorna Leitz, MD;  Location: [facility];  Service: Orthopedics;  Laterality: Right;  . TOTAL KNEE REVISION Right 07/22/2019   Procedure: Right knee polyethylene exchange;  Surgeon: Gaynelle Arabian, MD;  Location: [facility];  Service: Orthopedics;  Laterality: Right;  159min    SOCIAL HISTORY: Social History   Socioeconomic History  . Marital status: Single    Spouse name:  Not on file  . Number of children: 0  . Years of education: Not on file  . Highest education level: Not on file  Occupational History  . Occupation: Disabled.  Tobacco Use  . Smoking status: Former Smoker    Packs/day: 0.25    Years: 50.00    Pack years: 12.50    Types: Cigarettes    Quit date: 05/07/2013    Years since quitting: 7.1  . Smokeless tobacco: Never Used  Vaping Use  . Vaping Use: Never used  Substance and Sexual Activity  . Alcohol use: Not Currently    Comment: rare  . Drug use: No  . Sexual activity: Not on file  Other Topics Concern  . Not on file  Social History Narrative   Single.  Lives with family.   Social Determinants of Health   Financial Resource Strain: Not on file  Food Insecurity: Not on file  Transportation Needs: Not on file  Physical Activity: Not on file  Stress: Not on file  Social Connections: Not on file  Intimate Partner Violence: Not on file    FAMILY HISTORY: Family History  Problem Relation Age of Onset  . Breast cancer Neg Hx  ALLERGIES:  is allergic to erythromycin, aspirin, and penicillins.  MEDICATIONS:  Current Outpatient Medications  Medication Sig Dispense Refill  . acetaminophen (TYLENOL) 500 MG tablet Take 1,000 mg by mouth every 6 (six) hours as needed (for pain.).    Marland Kitchen Blood Glucose Monitoring Suppl (BLOOD GLUCOSE METER) kit Use as instructed 1 each 0  . Calcium Carbonate-Simethicone (MAALOX MAX PO) Take 5-10 mLs by mouth 3 (three) times daily as needed (for acid reflux/heartburn.).     Marland Kitchen fluticasone (FLONASE) 50 MCG/ACT nasal spray Place 1-2 sprays into both nostrils daily as needed for allergies.    Marland Kitchen losartan (COZAAR) 100 MG tablet Take 100 mg by mouth daily.    . metFORMIN (GLUCOPHAGE) 500 MG tablet Take 500 mg by mouth daily after supper.   0  . omeprazole (PRILOSEC) 40 MG capsule Take 40 mg by mouth daily.  1  . simvastatin (ZOCOR) 40 MG tablet Take 40 mg by mouth daily.  3  . Ascorbic Acid (VITAMIN C)  1000 MG tablet Take 1,000 mg by mouth daily. (Patient not taking: Reported on 07/13/2020)    . gabapentin (NEURONTIN) 100 MG capsule Take 2 capsules (200 mg total) by mouth 3 (three) times daily. Take 200 mg three times a day for two weeks following surgery.Then take 200 mg two times a day for two weeks. Then take 200 mg once a day for two weeks. Then discontinue. (Patient not taking: No sig reported) 168 capsule 0  . hydrochlorothiazide (HYDRODIURIL) 25 MG tablet Take 25 mg by mouth daily.    Marland Kitchen HYDROcodone-acetaminophen (NORCO/VICODIN) 5-325 MG tablet Take 1-2 tablets by mouth every 6 (six) hours as needed for severe pain. (Patient not taking: Reported on 04/05/2020) 56 tablet 0  . methocarbamol (ROBAXIN) 500 MG tablet Take 1 tablet (500 mg total) by mouth every 6 (six) hours as needed for muscle spasms. (Patient not taking: No sig reported) 40 tablet 0  . traMADol (ULTRAM) 50 MG tablet Take 1-2 tablets (50-100 mg total) by mouth every 6 (six) hours as needed for moderate pain. (Patient not taking: No sig reported) 40 tablet 0   No current facility-administered medications for this visit.     PHYSICAL EXAMINATION:  ECOG PERFORMANCE STATUS: 1 - Symptomatic but completely ambulatory  Vitals:   07/13/20 1030  BP: 135/70  Pulse: 76  Resp: 18  Temp: (!) 97 F (36.1 C)  SpO2: 100%   Filed Weights   07/13/20 1030  Weight: 156 lb 12.8 oz (71.1 kg)   Physical exam deferred in lieu of counseling.  LABORATORY DATA:  I have reviewed the data as listed Lab Results  Component Value Date   WBC 4.8 07/08/2020   HGB 12.5 07/08/2020   HCT 37.9 07/08/2020   MCV 91.1 07/08/2020   PLT 123 (L) 07/08/2020     Chemistry      Component Value Date/Time   NA 138 07/08/2020 0939   K 3.5 07/08/2020 0939   CL 103 07/08/2020 0939   CO2 28 07/08/2020 0939   BUN 27 (H) 07/08/2020 0939   CREATININE 0.78 07/08/2020 0939      Component Value Date/Time   CALCIUM 9.4 07/08/2020 0939   ALKPHOS 61  07/08/2020 0939   AST 17 07/08/2020 0939   ALT 14 07/08/2020 0939   BILITOT 0.6 07/08/2020 0939     03/10/2020  IMPRESSION: Further evaluation is suggested for possible asymmetry in the left breast.  RECOMMENDATION: Diagnostic mammogram and possibly ultrasound of the left breast. (Code:[id number])  The patient will be contacted regarding the findings, and additional imaging will be scheduled.  BI-RADS CATEGORY  0: Incomplete. Need additional imaging evaluation and/or prior mammograms for comparison.  04/08/2020  IMPRESSION: 1. Suspicious architectural distortion involving a significant portion of the inner LEFT breast, involving the upper inner quadrant and lower inner quadrant, centered at approximately the 9 o'clock axis but with extension across the midline to the slightly lateral LEFT breast, measuring at least 7 cm greatest dimension, without definite sonographic correlate. This is a suspicious finding for which stereotactic biopsies (2 sites) is recommended. 2. Oval circumscribed hypoechoic masses in the LEFT breast at the 6 o'clock axis and 9 o'clock axis, measuring 7 mm and 1.4 cm respectively, 1 of which corresponds to a low-density mass seen within the lower LEFT breast on mammogram, neither of which is likely to correspond to the architectural distortion seen on mammogram.  04/20/2020  Diagnosis 1. Breast, left, needle core biopsy, lower inner quadrant and upper inner quadrant medial extent - FIBROCYSTIC CHANGES WITH CALCIFICATIONS. - FOCAL LOBULAR NEOPLASIA (ATYPICAL LOBULAR HYPERPLASIA). - SEE MICROSCOPIC DESCRIPTION. 2. Breast, left, needle core biopsy, lower inner quadrant and upper inner quadrant lateral extent - FIBROCYSTIC CHANGES WITH CALCIFICATIONS. - FOCAL LOBULAR NEOPLASIA (ATYPICAL LOBULAR HYPERPLASIA). - SEE MICROSCOPIC DESCRIPTION.  RADIOGRAPHIC STUDIES: I have personally reviewed the radiological images as listed and agreed with the  findings in the report. No results found.  All questions were answered. The patient knows to call the clinic with any problems, questions or concerns. I spent 45 minutes in the care of this patient including history, review of records, counseling and coordination of care.     Benay Pike, MD 07/13/2020 10:49 AM

## 2020-07-14 ENCOUNTER — Other Ambulatory Visit (HOSPITAL_COMMUNITY)
Admission: RE | Admit: 2020-07-14 | Discharge: 2020-07-14 | Disposition: A | Discharge status: Home / Self Care | Payer: Medicare HMO | Source: Ambulatory Visit | Attending: Orthopedic Surgery | Admitting: Orthopedic Surgery

## 2020-07-14 DIAGNOSIS — Z01812 Encounter for preprocedural laboratory examination: Secondary | ICD-10-CM | POA: Insufficient documentation

## 2020-07-14 DIAGNOSIS — Z20822 Contact with and (suspected) exposure to covid-19: Secondary | ICD-10-CM | POA: Insufficient documentation

## 2020-07-14 LAB — SARS CORONAVIRUS 2 (TAT 6-24 HRS): SARS Coronavirus 2: NEGATIVE

## 2020-07-17 MED ORDER — BUPIVACAINE LIPOSOME 1.3 % IJ SUSP
20.0000 mL | Freq: Once | INTRAMUSCULAR | Status: DC
  Filled 2020-07-17: qty 20

## 2020-07-18 ENCOUNTER — Other Ambulatory Visit: Payer: Self-pay

## 2020-07-18 ENCOUNTER — Encounter (HOSPITAL_COMMUNITY): Payer: Self-pay | Admitting: Orthopedic Surgery

## 2020-07-18 ENCOUNTER — Encounter (HOSPITAL_COMMUNITY)
Admission: RE | Disposition: A | Discharge status: Home / Self Care | Payer: Self-pay | Source: Home / Self Care | Attending: Orthopedic Surgery

## 2020-07-18 ENCOUNTER — Ambulatory Visit (HOSPITAL_COMMUNITY): Payer: Medicare HMO | Admitting: Certified Registered Nurse Anesthetist

## 2020-07-18 ENCOUNTER — Observation Stay (HOSPITAL_COMMUNITY)
Admission: RE | Admit: 2020-07-18 | Discharge: 2020-07-20 | Disposition: A | Discharge status: Home / Self Care | Payer: Medicare HMO | Attending: Orthopedic Surgery | Admitting: Orthopedic Surgery

## 2020-07-18 DIAGNOSIS — I10 Essential (primary) hypertension: Secondary | ICD-10-CM | POA: Diagnosis not present

## 2020-07-18 DIAGNOSIS — Z79899 Other long term (current) drug therapy: Secondary | ICD-10-CM | POA: Insufficient documentation

## 2020-07-18 DIAGNOSIS — Z87891 Personal history of nicotine dependence: Secondary | ICD-10-CM | POA: Insufficient documentation

## 2020-07-18 DIAGNOSIS — Z96651 Presence of right artificial knee joint: Secondary | ICD-10-CM | POA: Diagnosis not present

## 2020-07-18 DIAGNOSIS — M1712 Unilateral primary osteoarthritis, left knee: Principal | ICD-10-CM | POA: Diagnosis present

## 2020-07-18 DIAGNOSIS — Z7984 Long term (current) use of oral hypoglycemic drugs: Secondary | ICD-10-CM | POA: Diagnosis not present

## 2020-07-18 DIAGNOSIS — E119 Type 2 diabetes mellitus without complications: Secondary | ICD-10-CM | POA: Insufficient documentation

## 2020-07-18 DIAGNOSIS — M25762 Osteophyte, left knee: Secondary | ICD-10-CM | POA: Insufficient documentation

## 2020-07-18 HISTORY — PX: TOTAL KNEE ARTHROPLASTY: SHX125

## 2020-07-18 LAB — GLUCOSE, CAPILLARY
Glucose-Capillary: 103 mg/dL — ABNORMAL HIGH (ref 70–99)
Glucose-Capillary: 87 mg/dL (ref 70–99)
Glucose-Capillary: 217 mg/dL — ABNORMAL HIGH (ref 70–99)
Glucose-Capillary: 172 mg/dL — ABNORMAL HIGH (ref 70–99)

## 2020-07-18 LAB — TYPE AND SCREEN
ABO/RH(D): O POS
Antibody Screen: NEGATIVE

## 2020-07-18 SURGERY — ARTHROPLASTY, KNEE, TOTAL
Anesthesia: Regional | Site: Knee | Laterality: Left

## 2020-07-18 MED ORDER — TRANEXAMIC ACID-NACL 1000-0.7 MG/100ML-% IV SOLN
1000.0000 mg | INTRAVENOUS | Status: AC
  Administered 2020-07-18: 1000 mg via INTRAVENOUS
  Filled 2020-07-18: qty 100

## 2020-07-18 MED ORDER — FENTANYL CITRATE (PF) 100 MCG/2ML IJ SOLN
INTRAMUSCULAR | Status: AC
  Filled 2020-07-18: qty 2

## 2020-07-18 MED ORDER — LACTATED RINGERS IV SOLN: INTRAVENOUS | Status: DC

## 2020-07-18 MED ORDER — FENTANYL CITRATE (PF) 100 MCG/2ML IJ SOLN
INTRAMUSCULAR | Status: DC | PRN
  Administered 2020-07-18: 100 ug via INTRAVENOUS

## 2020-07-18 MED ORDER — METOCLOPRAMIDE HCL 5 MG/ML IJ SOLN: 5.0000 mg | Freq: Three times a day (TID) | INTRAMUSCULAR | Status: DC | PRN

## 2020-07-18 MED ORDER — MENTHOL 3 MG MT LOZG: 1.0000 | LOZENGE | OROMUCOSAL | Status: DC | PRN

## 2020-07-18 MED ORDER — DOCUSATE SODIUM 100 MG PO CAPS
100.0000 mg | ORAL_CAPSULE | Freq: Two times a day (BID) | ORAL | Status: DC
  Administered 2020-07-18 – 2020-07-20 (×3): 100 mg via ORAL
  Filled 2020-07-18 (×4): qty 1

## 2020-07-18 MED ORDER — PROMETHAZINE HCL 25 MG/ML IJ SOLN
6.2500 mg | INTRAMUSCULAR | Status: DC | PRN
Start: 2020-07-18 — End: 2020-07-18
  Administered 2020-07-18: 12.5 mg via INTRAVENOUS

## 2020-07-18 MED ORDER — HYDROCHLOROTHIAZIDE 25 MG PO TABS
25.0000 mg | ORAL_TABLET | Freq: Every day | ORAL | Status: DC
Start: 2020-07-19 — End: 2020-07-20
  Administered 2020-07-19 – 2020-07-20 (×2): 25 mg via ORAL
  Filled 2020-07-18 (×2): qty 1

## 2020-07-18 MED ORDER — ORAL CARE MOUTH RINSE: 15.0000 mL | Freq: Once | OROMUCOSAL | Status: AC

## 2020-07-18 MED ORDER — ACETAMINOPHEN 10 MG/ML IV SOLN
1000.0000 mg | Freq: Four times a day (QID) | INTRAVENOUS | Status: DC
  Administered 2020-07-18: 1000 mg via INTRAVENOUS
  Filled 2020-07-18: qty 100

## 2020-07-18 MED ORDER — CEFAZOLIN SODIUM-DEXTROSE 2-4 GM/100ML-% IV SOLN
2.0000 g | Freq: Four times a day (QID) | INTRAVENOUS | Status: AC
  Administered 2020-07-18 (×2): 2 g via INTRAVENOUS
  Filled 2020-07-18 (×2): qty 100

## 2020-07-18 MED ORDER — TRAMADOL HCL 50 MG PO TABS
50.0000 mg | ORAL_TABLET | Freq: Four times a day (QID) | ORAL | Status: DC | PRN
  Administered 2020-07-18 – 2020-07-19 (×2): 50 mg via ORAL
  Administered 2020-07-19: 100 mg via ORAL
  Filled 2020-07-18: qty 2
  Filled 2020-07-18 (×2): qty 1

## 2020-07-18 MED ORDER — METHOCARBAMOL 500 MG PO TABS
500.0000 mg | ORAL_TABLET | Freq: Four times a day (QID) | ORAL | Status: DC | PRN
  Administered 2020-07-18 – 2020-07-20 (×5): 500 mg via ORAL
  Filled 2020-07-18 (×5): qty 1

## 2020-07-18 MED ORDER — PROMETHAZINE HCL 25 MG/ML IJ SOLN
INTRAMUSCULAR | Status: AC
  Filled 2020-07-18: qty 1

## 2020-07-18 MED ORDER — LOSARTAN POTASSIUM 50 MG PO TABS
100.0000 mg | ORAL_TABLET | Freq: Every day | ORAL | Status: DC
  Administered 2020-07-19 – 2020-07-20 (×2): 100 mg via ORAL
  Filled 2020-07-18 (×2): qty 2

## 2020-07-18 MED ORDER — MORPHINE SULFATE (PF) 2 MG/ML IV SOLN: 0.5000 mg | INTRAVENOUS | Status: DC | PRN

## 2020-07-18 MED ORDER — CHLORHEXIDINE GLUCONATE 0.12 % MT SOLN
15.0000 mL | Freq: Once | OROMUCOSAL | Status: AC
  Administered 2020-07-18: 15 mL via OROMUCOSAL

## 2020-07-18 MED ORDER — ONDANSETRON HCL 4 MG/2ML IJ SOLN
INTRAMUSCULAR | Status: DC | PRN
  Administered 2020-07-18: 4 mg via INTRAVENOUS

## 2020-07-18 MED ORDER — STERILE WATER FOR IRRIGATION IR SOLN
Status: DC | PRN
  Administered 2020-07-18: 2000 mL

## 2020-07-18 MED ORDER — PROPOFOL 500 MG/50ML IV EMUL
INTRAVENOUS | Status: DC | PRN
  Administered 2020-07-18: 75 ug/kg/min via INTRAVENOUS

## 2020-07-18 MED ORDER — PANTOPRAZOLE SODIUM 40 MG PO TBEC
80.0000 mg | DELAYED_RELEASE_TABLET | Freq: Every day | ORAL | Status: DC
  Administered 2020-07-19 – 2020-07-20 (×2): 80 mg via ORAL
  Filled 2020-07-18 (×2): qty 2

## 2020-07-18 MED ORDER — POVIDONE-IODINE 10 % EX SWAB
2.0000 "application " | Freq: Once | CUTANEOUS | Status: AC
  Administered 2020-07-18: 2 via TOPICAL

## 2020-07-18 MED ORDER — FLUTICASONE PROPIONATE 50 MCG/ACT NA SUSP
1.0000 | Freq: Every day | NASAL | Status: DC | PRN
  Filled 2020-07-18: qty 16

## 2020-07-18 MED ORDER — BISACODYL 10 MG RE SUPP: 10.0000 mg | Freq: Every day | RECTAL | Status: DC | PRN

## 2020-07-18 MED ORDER — MIDAZOLAM HCL 2 MG/2ML IJ SOLN
1.0000 mg | INTRAMUSCULAR | Status: DC
  Administered 2020-07-18: 2 mg via INTRAVENOUS
  Filled 2020-07-18: qty 2

## 2020-07-18 MED ORDER — ROPIVACAINE HCL 5 MG/ML IJ SOLN
INTRAMUSCULAR | Status: DC | PRN
  Administered 2020-07-18: 20 mL via PERINEURAL

## 2020-07-18 MED ORDER — RIVAROXABAN 10 MG PO TABS
10.0000 mg | ORAL_TABLET | Freq: Every day | ORAL | Status: DC
  Administered 2020-07-19 – 2020-07-20 (×2): 10 mg via ORAL
  Filled 2020-07-18 (×2): qty 1

## 2020-07-18 MED ORDER — OXYCODONE HCL 5 MG/5ML PO SOLN
5.0000 mg | Freq: Once | ORAL | Status: DC | PRN
Start: 2020-07-18 — End: 2020-07-18

## 2020-07-18 MED ORDER — PHENOL 1.4 % MT LIQD: 1.0000 | OROMUCOSAL | Status: DC | PRN

## 2020-07-18 MED ORDER — ONDANSETRON HCL 4 MG/2ML IJ SOLN
4.0000 mg | Freq: Four times a day (QID) | INTRAMUSCULAR | Status: DC | PRN
  Administered 2020-07-19: 4 mg via INTRAVENOUS
  Filled 2020-07-18 (×2): qty 2

## 2020-07-18 MED ORDER — FLEET ENEMA 7-19 GM/118ML RE ENEM: 1.0000 | ENEMA | Freq: Once | RECTAL | Status: DC | PRN

## 2020-07-18 MED ORDER — FENTANYL CITRATE (PF) 100 MCG/2ML IJ SOLN
50.0000 ug | Freq: Once | INTRAMUSCULAR | Status: AC
  Administered 2020-07-18: 100 ug via INTRAVENOUS
  Filled 2020-07-18: qty 2

## 2020-07-18 MED ORDER — OXYCODONE HCL 5 MG PO TABS: 5.0000 mg | ORAL_TABLET | Freq: Once | ORAL | Status: DC | PRN

## 2020-07-18 MED ORDER — DIPHENHYDRAMINE HCL 12.5 MG/5ML PO ELIX
12.5000 mg | ORAL_SOLUTION | ORAL | Status: DC | PRN
  Administered 2020-07-19 (×2): 25 mg via ORAL
  Filled 2020-07-18 (×2): qty 10

## 2020-07-18 MED ORDER — POLYETHYLENE GLYCOL 3350 17 G PO PACK: 17.0000 g | PACK | Freq: Every day | ORAL | Status: DC | PRN

## 2020-07-18 MED ORDER — INSULIN ASPART 100 UNIT/ML ~~LOC~~ SOLN: 0.0000 [IU] | Freq: Every day | SUBCUTANEOUS | Status: DC

## 2020-07-18 MED ORDER — GABAPENTIN 100 MG PO CAPS
100.0000 mg | ORAL_CAPSULE | Freq: Three times a day (TID) | ORAL | Status: DC
  Administered 2020-07-18 – 2020-07-20 (×5): 100 mg via ORAL
  Filled 2020-07-18 (×6): qty 1

## 2020-07-18 MED ORDER — ACETAMINOPHEN 500 MG PO TABS
1000.0000 mg | ORAL_TABLET | Freq: Four times a day (QID) | ORAL | Status: DC
  Administered 2020-07-18: 1000 mg via ORAL
  Filled 2020-07-18: qty 2

## 2020-07-18 MED ORDER — ONDANSETRON HCL 4 MG PO TABS: 4.0000 mg | ORAL_TABLET | Freq: Four times a day (QID) | ORAL | Status: DC | PRN

## 2020-07-18 MED ORDER — BUPIVACAINE LIPOSOME 1.3 % IJ SUSP
INTRAMUSCULAR | Status: DC | PRN
  Administered 2020-07-18: 20 mL

## 2020-07-18 MED ORDER — HYDROCODONE-ACETAMINOPHEN 5-325 MG PO TABS
1.0000 | ORAL_TABLET | ORAL | Status: DC | PRN
  Administered 2020-07-18: 1 via ORAL
  Administered 2020-07-19 – 2020-07-20 (×5): 2 via ORAL
  Administered 2020-07-20: 1 via ORAL
  Administered 2020-07-20: 2 via ORAL
  Filled 2020-07-18 (×9): qty 2

## 2020-07-18 MED ORDER — DEXAMETHASONE SODIUM PHOSPHATE 10 MG/ML IJ SOLN
8.0000 mg | Freq: Once | INTRAMUSCULAR | Status: AC
  Administered 2020-07-18: 10 mg via INTRAVENOUS

## 2020-07-18 MED ORDER — HYDROMORPHONE HCL 1 MG/ML IJ SOLN
0.2500 mg | INTRAMUSCULAR | Status: DC | PRN
  Administered 2020-07-18 (×2): 0.5 mg via INTRAVENOUS

## 2020-07-18 MED ORDER — SODIUM CHLORIDE 0.9 % IV SOLN: INTRAVENOUS | Status: DC

## 2020-07-18 MED ORDER — METOCLOPRAMIDE HCL 5 MG PO TABS: 5.0000 mg | ORAL_TABLET | Freq: Three times a day (TID) | ORAL | Status: DC | PRN

## 2020-07-18 MED ORDER — CHLORHEXIDINE GLUCONATE CLOTH 2 % EX PADS: 6.0000 | MEDICATED_PAD | Freq: Every day | CUTANEOUS | Status: DC

## 2020-07-18 MED ORDER — HYDROMORPHONE HCL 1 MG/ML IJ SOLN
INTRAMUSCULAR | Status: AC
  Filled 2020-07-18: qty 1

## 2020-07-18 MED ORDER — CEFAZOLIN SODIUM-DEXTROSE 2-4 GM/100ML-% IV SOLN
2.0000 g | INTRAVENOUS | Status: AC
  Administered 2020-07-18: 2 g via INTRAVENOUS
  Filled 2020-07-18: qty 100

## 2020-07-18 MED ORDER — INSULIN ASPART 100 UNIT/ML ~~LOC~~ SOLN
0.0000 [IU] | Freq: Three times a day (TID) | SUBCUTANEOUS | Status: DC
  Administered 2020-07-18: 5 [IU] via SUBCUTANEOUS
  Administered 2020-07-19: 3 [IU] via SUBCUTANEOUS
  Administered 2020-07-19 – 2020-07-20 (×2): 2 [IU] via SUBCUTANEOUS

## 2020-07-18 MED ORDER — BUPIVACAINE HCL (PF) 0.75 % IJ SOLN
INTRAMUSCULAR | Status: DC | PRN
  Administered 2020-07-18: 1.8 mL via INTRATHECAL

## 2020-07-18 MED ORDER — SODIUM CHLORIDE 0.9 % IR SOLN
Status: DC | PRN
  Administered 2020-07-18: 1000 mL

## 2020-07-18 MED ORDER — OXYCODONE HCL 5 MG PO TABS: 5.0000 mg | ORAL_TABLET | ORAL | Status: DC | PRN

## 2020-07-18 MED ORDER — METHOCARBAMOL 1000 MG/10ML IJ SOLN
500.0000 mg | Freq: Four times a day (QID) | INTRAVENOUS | Status: DC | PRN
  Filled 2020-07-18: qty 5

## 2020-07-18 MED ORDER — PHENYLEPHRINE HCL (PRESSORS) 10 MG/ML IV SOLN
INTRAVENOUS | Status: DC | PRN
  Administered 2020-07-18 (×2): 80 ug via INTRAVENOUS
  Administered 2020-07-18: 120 ug via INTRAVENOUS
  Administered 2020-07-18: 80 ug via INTRAVENOUS

## 2020-07-18 MED ORDER — SIMVASTATIN 40 MG PO TABS
40.0000 mg | ORAL_TABLET | Freq: Every day | ORAL | Status: DC
  Administered 2020-07-18 – 2020-07-20 (×3): 40 mg via ORAL
  Filled 2020-07-18 (×3): qty 1

## 2020-07-18 MED ORDER — SODIUM CHLORIDE (PF) 0.9 % IJ SOLN
INTRAMUSCULAR | Status: DC | PRN
  Administered 2020-07-18: 60 mL

## 2020-07-18 SURGICAL SUPPLY — 54 items
ATTUNE MED DOME PAT 32 KNEE (Knees) ×2 IMPLANT
ATTUNE PSFEM LTSZ5 NARCEM KNEE (Femur) ×2 IMPLANT
ATTUNE PSRP INSR SZ5 8 KNEE (Insert) ×2 IMPLANT
BAG ZIPLOCK 12X15 (MISCELLANEOUS) ×2 IMPLANT
BASEPLATE TIBIAL ROTATING SZ 4 (Knees) ×2 IMPLANT
BLADE SAG 18X100X1.27 (BLADE) ×2 IMPLANT
BLADE SAW SGTL 11.0X1.19X90.0M (BLADE) ×2 IMPLANT
BNDG ELASTIC 6X10 VLCR STRL LF (GAUZE/BANDAGES/DRESSINGS) ×2 IMPLANT
BNDG ELASTIC 6X5.8 VLCR STR LF (GAUZE/BANDAGES/DRESSINGS) ×2 IMPLANT
BOWL SMART MIX CTS (DISPOSABLE) ×2 IMPLANT
CEMENT HV SMART SET (Cement) ×4 IMPLANT
COVER SURGICAL LIGHT HANDLE (MISCELLANEOUS) ×2 IMPLANT
COVER WAND RF STERILE (DRAPES) ×2 IMPLANT
CUFF TOURN SGL QUICK 34 (TOURNIQUET CUFF) ×1
CUFF TRNQT CYL 34X4.125X (TOURNIQUET CUFF) ×1 IMPLANT
DECANTER SPIKE VIAL GLASS SM (MISCELLANEOUS) IMPLANT
DRAPE U-SHAPE 47X51 STRL (DRAPES) ×2 IMPLANT
DRSG AQUACEL AG ADV 3.5X10 (GAUZE/BANDAGES/DRESSINGS) ×2 IMPLANT
DURAPREP 26ML APPLICATOR (WOUND CARE) ×2 IMPLANT
ELECT REM PT RETURN 15FT ADLT (MISCELLANEOUS) ×2 IMPLANT
GLOVE SRG 8 PF TXTR STRL LF DI (GLOVE) ×1 IMPLANT
GLOVE SURG ENC MOIS LTX SZ6 (GLOVE) IMPLANT
GLOVE SURG ENC MOIS LTX SZ7 (GLOVE) ×2 IMPLANT
GLOVE SURG ENC MOIS LTX SZ8 (GLOVE) ×2 IMPLANT
GLOVE SURG UNDER POLY LF SZ6.5 (GLOVE) ×2 IMPLANT
GLOVE SURG UNDER POLY LF SZ8 (GLOVE) ×1
GLOVE SURG UNDER POLY LF SZ8.5 (GLOVE) ×2 IMPLANT
GOWN STRL REUS W/TWL LRG LVL3 (GOWN DISPOSABLE) ×4 IMPLANT
GOWN STRL REUS W/TWL XL LVL3 (GOWN DISPOSABLE) ×2 IMPLANT
HANDPIECE INTERPULSE COAX TIP (DISPOSABLE) ×1
HOLDER FOLEY CATH W/STRAP (MISCELLANEOUS) ×2 IMPLANT
IMMOBILIZER KNEE 20 (SOFTGOODS) ×2
IMMOBILIZER KNEE 20 THIGH 36 (SOFTGOODS) ×1 IMPLANT
KIT TURNOVER KIT A (KITS) ×2 IMPLANT
MANIFOLD NEPTUNE II (INSTRUMENTS) ×2 IMPLANT
NS IRRIG 1000ML POUR BTL (IV SOLUTION) ×2 IMPLANT
PACK TOTAL KNEE CUSTOM (KITS) ×2 IMPLANT
PADDING CAST COTTON 6X4 STRL (CAST SUPPLIES) ×4 IMPLANT
PADDING CAST SYN 6 (CAST SUPPLIES) ×1
PADDING CAST SYNTHETIC 6X4 NS (CAST SUPPLIES) ×1 IMPLANT
PENCIL SMOKE EVACUATOR (MISCELLANEOUS) ×2 IMPLANT
PIN DRILL FIX HALF THREAD (BIT) ×2 IMPLANT
PIN STEINMAN FIXATION KNEE (PIN) ×2 IMPLANT
PROTECTOR NERVE ULNAR (MISCELLANEOUS) ×2 IMPLANT
SET HNDPC FAN SPRY TIP SCT (DISPOSABLE) ×1 IMPLANT
STRIP CLOSURE SKIN 1/2X4 (GAUZE/BANDAGES/DRESSINGS) ×4 IMPLANT
SUT MNCRL AB 4-0 PS2 18 (SUTURE) ×2 IMPLANT
SUT STRATAFIX 0 PDS 27 VIOLET (SUTURE) ×2
SUT VIC AB 2-0 CT1 27 (SUTURE) ×3
SUT VIC AB 2-0 CT1 TAPERPNT 27 (SUTURE) ×3 IMPLANT
SUTURE STRATFX 0 PDS 27 VIOLET (SUTURE) ×1 IMPLANT
TRAY FOLEY MTR SLVR 16FR STAT (SET/KITS/TRAYS/PACK) IMPLANT
WATER STERILE IRR 1000ML POUR (IV SOLUTION) ×4 IMPLANT
WRAP KNEE MAXI GEL POST OP (GAUZE/BANDAGES/DRESSINGS) ×2 IMPLANT

## 2020-07-18 NOTE — Discharge Instructions (Addendum)
 Connie Aluisio, MD Total Joint Specialist [facility] [address] [place],  [address] [phone]  TOTAL KNEE REPLACEMENT POSTOPERATIVE DIRECTIONS    Knee Rehabilitation, Guidelines Following Surgery  Results after knee surgery are often greatly improved when you follow the exercise, range of motion and muscle strengthening exercises prescribed by your doctor. Safety measures are also important to protect the knee from further injury. If any of these exercises cause you to have increased pain or swelling in your knee joint, decrease the amount until you are comfortable again and slowly increase them. If you have problems or questions, call your caregiver or physical therapist for advice.   BLOOD CLOT PREVENTION . Take a 10 mg Xarelto once a day for three weeks following surgery.  . You may resume your vitamins/supplements once you have discontinued the Xarelto. . Do not take any NSAIDs (Advil, Aleve, Ibuprofen, Meloxicam, etc.) until you have discontinued the Xarelto.   HOME CARE INSTRUCTIONS  . Remove items at home which could result in a fall. This includes throw rugs or furniture in walking pathways.  . ICE to the affected knee as much as tolerated. Icing helps control swelling. If the swelling is well controlled you will be more comfortable and rehab easier. Continue to use ice on the knee for pain and swelling from surgery. You may notice swelling that will progress down to the foot and ankle. This is normal after surgery. Elevate the leg when you are not up walking on it.    . Continue to use the breathing machine which will help keep your temperature down. It is common for your temperature to cycle up and down following surgery, especially at night when you are not up moving around and exerting yourself. The breathing machine keeps your lungs expanded and your temperature down. . Do not place pillow under the operative knee, focus on keeping the  knee straight while resting  DIET You may resume your previous home diet once you are discharged from the hospital.  DRESSING / WOUND CARE / SHOWERING . Keep your bulky bandage on for 2 days. On the third post-operative day you may remove the Ace bandage and gauze. There is a waterproof adhesive bandage on your skin which will stay in place until your first follow-up appointment. Once you remove this you will not need to place another bandage . You may begin showering 3 days following surgery, but do not submerge the incision under water.  ACTIVITY For the first 5 days, the key is rest and control of pain and swelling . Do your home exercises twice a day starting on post-operative day 3. On the days you go to physical therapy, just do the home exercises once that day. . You should rest, ice and elevate the leg for 50 minutes out of every hour. Get up and walk/stretch for 10 minutes per hour. After 5 days you can increase your activity slowly as tolerated. . Walk with your walker as instructed. Use the walker until you are comfortable transitioning to a cane. Walk with the cane in the opposite hand of the operative leg. You may discontinue the cane once you are comfortable and walking steadily. . Avoid periods of inactivity such as sitting longer than an hour when not asleep. This helps prevent blood clots.  . You may discontinue the knee immobilizer once you are able to perform a straight leg raise while lying down. . You may resume a sexual relationship in one month   or when given the OK by your doctor.  . You may return to work once you are cleared by your doctor.  . Do not drive a car for 6 weeks or until released by your surgeon.  . Do not drive while taking narcotics.  TED HOSE STOCKINGS Wear the elastic stockings on both legs for three weeks following surgery during the day. You may remove them at night for sleeping.  WEIGHT BEARING Weight bearing as tolerated with assist device  (walker, cane, etc) as directed, use it as long as suggested by your surgeon or therapist, typically at least 4-6 weeks.  POSTOPERATIVE CONSTIPATION PROTOCOL Constipation - defined medically as fewer than three stools per week and severe constipation as less than one stool per week.  One of the most common issues patients have following surgery is constipation.  Even if you have a regular bowel pattern at home, your normal regimen is likely to be disrupted due to multiple reasons following surgery.  Combination of anesthesia, postoperative narcotics, change in appetite and fluid intake all can affect your bowels.  In order to avoid complications following surgery, here are some recommendations in order to help you during your recovery period.  . Colace (docusate) - Pick up an over-the-counter form of Colace or another stool softener and take twice a day as long as you are requiring postoperative pain medications.  Take with a full glass of water daily.  If you experience loose stools or diarrhea, hold the colace until you stool forms back up. If your symptoms do not get better within 1 week or if they get worse, check with your doctor. . Dulcolax (bisacodyl) - Pick up over-the-counter and take as directed by the product packaging as needed to assist with the movement of your bowels.  Take with a full glass of water.  Use this product as needed if not relieved by Colace only.  . MiraLax (polyethylene glycol) - Pick up over-the-counter to have on hand. MiraLax is a solution that will increase the amount of water in your bowels to assist with bowel movements.  Take as directed and can mix with a glass of water, juice, soda, coffee, or tea. Take if you go more than two days without a movement. Do not use MiraLax more than once per day. Call your doctor if you are still constipated or irregular after using this medication for 7 days in a row.  If you continue to have problems with postoperative constipation,  please contact the office for further assistance and recommendations.  If you experience "the worst abdominal pain ever" or develop nausea or vomiting, please contact the office immediatly for further recommendations for treatment.  ITCHING If you experience itching with your medications, try taking only a single pain pill, or even half a pain pill at a time.  You can also use Benadryl over the counter for itching or also to help with sleep.   MEDICATIONS See your medication summary on the "After Visit Summary" that the nursing staff will review with you prior to discharge.  You may have some home medications which will be placed on hold until you complete the course of blood thinner medication.  It is important for you to complete the blood thinner medication as prescribed by your surgeon.  Continue your approved medications as instructed at time of discharge.  PRECAUTIONS . If you experience chest pain or shortness of breath - call 911 immediately for transfer to the hospital emergency department.  . If   you develop a fever greater that 101 F, purulent drainage from wound, increased redness or drainage from wound, foul odor from the wound/dressing, or calf pain - CONTACT YOUR SURGEON.                                                   FOLLOW-UP APPOINTMENTS Make sure you keep all of your appointments after your operation with your surgeon and caregivers. You should call the office at the above phone number and make an appointment for approximately two weeks after the date of your surgery or on the date instructed by your surgeon outlined in the "After Visit Summary".  RANGE OF MOTION AND STRENGTHENING EXERCISES  Rehabilitation of the knee is important following a knee injury or an operation. After just a few days of immobilization, the muscles of the thigh which control the knee become weakened and shrink (atrophy). Knee exercises are designed to build up the tone and strength of the thigh muscles  and to improve knee motion. Often times heat used for twenty to thirty minutes before working out will loosen up your tissues and help with improving the range of motion but do not use heat for the first two weeks following surgery. These exercises can be done on a training (exercise) mat, on the floor, on a table or on a bed. Use what ever works the best and is most comfortable for you Knee exercises include:  . Leg Lifts - While your knee is still immobilized in a splint or cast, you can do straight leg raises. Lift the leg to 60 degrees, hold for 3 sec, and slowly lower the leg. Repeat 10-20 times 2-3 times daily. Perform this exercise against resistance later as your knee gets better.  . Quad and Hamstring Sets - Tighten up the muscle on the front of the thigh (Quad) and hold for 5-10 sec. Repeat this 10-20 times hourly. Hamstring sets are done by pushing the foot backward against an object and holding for 5-10 sec. Repeat as with quad sets.   Leg Slides: Lying on your back, slowly slide your foot toward your buttocks, bending your knee up off the floor (only go as far as is comfortable). Then slowly slide your foot back down until your leg is flat on the floor again.  Angel Wings: Lying on your back spread your legs to the side as far apart as you can without causing discomfort.  A rehabilitation program following serious knee injuries can speed recovery and prevent re-injury in the future due to weakened muscles. Contact your doctor or a physical therapist for more information on knee rehabilitation.   IF YOU ARE TRANSFERRED TO A SKILLED REHAB FACILITY If the patient is transferred to a skilled rehab facility following release from the hospital, a list of the current medications will be sent to the facility for the patient to continue.  When discharged from the skilled rehab facility, please have the facility set up the patient's Home Health Physical Therapy prior to being released. Also, the skilled  facility will be responsible for providing the patient with their medications at time of release from the facility to include their pain medication, the muscle relaxants, and their blood thinner medication. If the patient is still at the rehab facility at time of the two week follow up appointment, the skilled rehab facility   will also need to assist the patient in arranging follow up appointment in our office and any transportation needs.  MAKE SURE YOU:  . Understand these instructions.  . Get help right away if you are not doing well or get worse.   DENTAL ANTIBIOTICS:  In most cases prophylactic antibiotics for Dental procdeures after total joint surgery are not necessary.  Exceptions are as follows:  1. History of prior total joint infection  2. Severely immunocompromised (Organ Transplant, cancer chemotherapy, Rheumatoid biologic meds such as Humera)  3. Poorly controlled diabetes (A1C &gt; 8.0, blood glucose over 200)  If you have one of these conditions, contact your surgeon for an antibiotic prescription, prior to your dental procedure.    Pick up stool softner and laxative for home use following surgery while on pain medications. Do not submerge incision under water. Please use good hand washing techniques while changing dressing each day. May shower starting three days after surgery. Please use a clean towel to pat the incision dry following showers. Continue to use ice for pain and swelling after surgery. Do not use any lotions or creams on the incision until instructed by your surgeon.  Information on my medicine - XARELTO (Rivaroxaban)   Why was Xarelto prescribed for you? Xarelto was prescribed for you to reduce the risk of blood clots forming after orthopedic surgery. The medical term for these abnormal blood clots is venous thromboembolism (VTE).  What do you need to know about xarelto ? Take your Xarelto ONCE DAILY at the same time every day. You may take  it either with or without food.  If you have difficulty swallowing the tablet whole, you may crush it and mix in applesauce just prior to taking your dose.  Take Xarelto exactly as prescribed by your doctor and DO NOT stop taking Xarelto without talking to the doctor who prescribed the medication.  Stopping without other VTE prevention medication to take the place of Xarelto may increase your risk of developing a clot.  After discharge, you should have regular check-up appointments with your healthcare provider that is prescribing your Xarelto.    What do you do if you miss a dose? If you miss a dose, take it as soon as you remember on the same day then continue your regularly scheduled once daily regimen the next day. Do not take two doses of Xarelto on the same day.   Important Safety Information A possible side effect of Xarelto is bleeding. You should call your healthcare provider right away if you experience any of the following: ? Bleeding from an injury or your nose that does not stop. ? Unusual colored urine (red or dark brown) or unusual colored stools (red or black). ? Unusual bruising for unknown reasons. ? A serious fall or if you hit your head (even if there is no bleeding).  Some medicines may interact with Xarelto and might increase your risk of bleeding while on Xarelto. To help avoid this, consult your healthcare provider or pharmacist prior to using any new prescription or non-prescription medications, including herbals, vitamins, non-steroidal anti-inflammatory drugs (NSAIDs) and supplements.  This website has more information on Xarelto: www.xarelto.com.   

## 2020-07-18 NOTE — Anesthesia Procedure Notes (Signed)
Anesthesia Regional Block: Adductor canal block   Pre-Anesthetic Checklist: ,, timeout performed, Correct Patient, Correct Site, Correct Laterality, Correct Procedure, Correct Position, site marked, Risks and benefits discussed,  Surgical consent,  Pre-op evaluation,  At surgeon's request and post-op pain management  Laterality: Left  Prep: chloraprep       Needles:  Injection technique: Single-shot  Needle Type: Stimiplex     Needle Length: 9cm  Needle Gauge: 21     Additional Needles:   Procedures:,,,, ultrasound used (permanent image in chart),,,,  Narrative:  Start time: 07/18/2020 8:06 AM End time: 07/18/2020 8:11 AM Injection made incrementally with aspirations every 5 mL.  Performed by: Personally  Anesthesiologist: Lowella Curb, MD

## 2020-07-18 NOTE — Anesthesia Procedure Notes (Signed)
Spinal  Start time: 07/18/2020 8:30 AM End time: 07/18/2020 8:35 AM Staffing Performed: resident/CRNA  Resident/CRNA: Kizzie Fantasia, CRNA Preanesthetic Checklist Completed: patient identified, IV checked, site marked, risks and benefits discussed, surgical consent, monitors and equipment checked, pre-op evaluation and timeout performed Spinal Block Patient position: sitting Prep: DuraPrep Patient monitoring: heart rate, continuous pulse ox and blood pressure Location: L4-5 Injection technique: single-shot Needle Needle type: Pencan  Needle gauge: 24 G Needle length: 9 cm Needle insertion depth: 7 cm Additional Notes Pt sitting position, sterile prep and drape, negative paresthesia/heme

## 2020-07-18 NOTE — Anesthesia Preprocedure Evaluation (Signed)
Anesthesia Evaluation  Patient identified by MRN, date of birth, ID band Patient awake    Reviewed: Allergy & Precautions, NPO status , Patient's Chart, lab work & pertinent test results  Airway Mallampati: II  TM Distance: >3 FB Neck ROM: Full    Dental  (+) Dental Advisory Given, Edentulous Upper   Pulmonary neg pulmonary ROS, former smoker,    Pulmonary exam normal breath sounds clear to auscultation       Cardiovascular hypertension, Pt. on medications Normal cardiovascular exam Rhythm:Regular Rate:Normal     Neuro/Psych  Headaches, Anxiety negative psych ROS   GI/Hepatic Neg liver ROS, GERD  Medicated,  Endo/Other  diabetes, Oral Hypoglycemic AgentsMorbid obesity  Renal/GU Renal disease     Musculoskeletal  (+) Arthritis , Osteoarthritis,    Abdominal   Peds  Hematology negative hematology ROS (+) anemia ,   Anesthesia Other Findings   Reproductive/Obstetrics negative OB ROS                             Anesthesia Physical  Anesthesia Plan  ASA: III  Anesthesia Plan: Spinal and Regional   Post-op Pain Management:  Regional for Post-op pain   Induction:   PONV Risk Score and Plan: 2 and Treatment may vary due to age or medical condition, Propofol infusion and Ondansetron  Airway Management Planned: Natural Airway and Simple Face Mask  Additional Equipment:   Intra-op Plan:   Post-operative Plan:   Informed Consent: I have reviewed the patients History and Physical, chart, labs and discussed the procedure including the risks, benefits and alternatives for the proposed anesthesia with the patient or authorized representative who has indicated his/her understanding and acceptance.     Dental advisory given  Plan Discussed with: CRNA  Anesthesia Plan Comments: (  )        Anesthesia Quick Evaluation

## 2020-07-18 NOTE — Progress Notes (Signed)
Orthopedic Tech Progress Note Patient Details:  Connie Branch [birth year] [id number]  CPM Left Knee CPM Left Knee: Off Left Knee Flexion (Degrees): 40 Left Knee Extension (Degrees): 10  Post Interventions Patient Tolerated: Well Instructions Provided: Care of device  Saul Fordyce 07/18/2020, 2:02 PM

## 2020-07-18 NOTE — Progress Notes (Signed)
Orthopedic Tech Progress Note Patient Details:  Connie Branch November 24, 1950 [id number]  CPM Left Knee CPM Left Knee: On Left Knee Flexion (Degrees): 40 Left Knee Extension (Degrees): 10  Post Interventions Patient Tolerated: Well Instructions Provided: Care of device  Saul Fordyce 07/18/2020, 10:01 AM

## 2020-07-18 NOTE — Evaluation (Signed)
Physical Therapy Evaluation Patient Details Name: Connie Branch MRN: [id number] DOB: August 10, 1950 Today's Date: 07/18/2020   History of Present Illness  Pt is 70 yo female s/p L TKA on 07/18/20.  Pt with PMH including neuropathy, HTN, claustrophobia, arthritis, anxiety, R TKA 19 with manipulation and revision in 2021.  Clinical Impression  Pt is s/p L TKA resulting in the deficits listed below (see PT Problem List). Pt had c/o pain and voiced pain with movements but was able to transfer and ambulate with good control of L leg.  Provided cues for relaxation and transfer technique to control pain.  Pt expected to progress well as pain is controlled.  She has support and DME at home.  She does have a flight of stairs to contend with and will need stair training.  Pt will benefit from skilled PT to increase their independence and safety with mobility to allow discharge to the venue listed below.      Follow Up Recommendations Follow surgeon's recommendation for DC plan and follow-up therapies;Supervision for mobility/OOB    Equipment Recommendations  None recommended by PT    Recommendations for Other Services       Precautions / Restrictions Precautions Precautions: Fall Restrictions Weight Bearing Restrictions: Yes LLE Weight Bearing: Weight bearing as tolerated      Mobility  Bed Mobility Overal bed mobility: Needs Assistance Bed Mobility: Supine to Sit;Sit to Supine     Supine to sit: Min assist Sit to supine: Min assist   General bed mobility comments: Min A for L LE with transfers.  Cued for sequencing and to scoot all the way to edge of bed.  Pt with c/o pain and grimacing but is trying to hold leg up herself - cued for relaxation and to let therapist assist with L LE - seemed to improve pain symptoms    Transfers Overall transfer level: Needs assistance Equipment used: Rolling walker (2 wheeled) Transfers: Sit to/from Stand Sit to Stand: Min assist;From elevated  surface         General transfer comment: Min A and increased time to rise; cues for hand placement and L LE management; min A to extend L LE with sitting to ease pain  Ambulation/Gait Ambulation/Gait assistance: Min guard Gait Distance (Feet): 100 Feet Assistive device: Rolling walker (2 wheeled) Gait Pattern/deviations: Step-to pattern;Decreased stride length Gait velocity: decreased   General Gait Details: Cued for step to L pattern with small steps to control pain.  Pt with frequent c/o pain throughout session and c/o not getting enough pain meds, but was able to ambulate well and wanted to go further.  Discussed pain control and frequent short walks to help control pain rather than long walks.  Cued to turn  around, pt did have increased pain when returned to room.  Stairs            Wheelchair Mobility    Modified Rankin (Stroke Patients Only)       Balance Overall balance assessment: Needs assistance Sitting-balance support: No upper extremity supported Sitting balance-Leahy Scale: Good     Standing balance support: Bilateral upper extremity supported Standing balance-Leahy Scale: Poor Standing balance comment: reqiored RW                             Pertinent Vitals/Pain Pain Assessment: 0-10 Pain Score: 7  Pain Location: L knee Pain Descriptors / Indicators: Discomfort;Sore Pain Intervention(s): Limited activity within patient's tolerance;Monitored during session;Repositioned;Relaxation;Premedicated before  session;Utilized relaxation techniques;Ice applied    Home Living Family/patient expects to be discharged to:: Private residence Living Arrangements: Other (Comment) (nephew staying with her) Available Help at Discharge: Available 24 hours/day Type of Home: House Home Access: Stairs to enter Entrance Stairs-Rails: None Entrance Stairs-Number of Steps: 1 curb Home Layout: Two level;Bed/bath upstairs;1/2 bath on main level Home Equipment:  Emergency planning/management officer - 2 wheels;Cane - single point;Grab bars - tub/shower;Grab bars - toilet (reports needs new cane) Additional Comments: Had BSC but did not like so she got rid of it and got elevated toilet and rails    Prior Function Level of Independence: Needs assistance   Gait / Transfers Assistance Needed: Pt was ambulating with cane in house and RW when she went out  ADL's / Homemaking Assistance Needed: Pt reports independent with ADLs, IADLs, and driving but with some difficulty due to pain        Hand Dominance   Dominant Hand: Right    Extremity/Trunk Assessment   Upper Extremity Assessment Upper Extremity Assessment: Overall WFL for tasks assessed    Lower Extremity Assessment Lower Extremity Assessment: LLE deficits/detail;RLE deficits/detail RLE Deficits / Details: WNL LLE Deficits / Details: Presents with changes consistent with post op, did have limitations due to pain.  ROM: ankle and hip WFL, knee ~10 to 60 degrees limited by pain and ACE wrap; MMT: ankle 3/5 not futher tested, hip and knee 2/5 limited by pain LLE Sensation: WNL    Cervical / Trunk Assessment Cervical / Trunk Assessment: Normal  Communication   Communication: No difficulties  Cognition Arousal/Alertness: Awake/alert Behavior During Therapy: WFL for tasks assessed/performed Overall Cognitive Status: Within Functional Limits for tasks assessed                                        General Comments General comments (skin integrity, edema, etc.): Educated on safe ice use, resting with leg straight, plan of care, and no pivots.  Pt with frequent complaints about not getting enough pain meds and wanting additional meds added - RN is aware, surgeon in at end of session to talk to pt.   VSS left on RA with continuous pulse ox connected    Exercises Total Joint Exercises Ankle Circles/Pumps: AROM;Both;10 reps;Supine   Assessment/Plan    PT Assessment Patient needs continued  PT services  PT Problem List Decreased strength;Decreased mobility;Decreased safety awareness;Decreased range of motion;Decreased knowledge of precautions;Decreased activity tolerance;Decreased balance;Decreased knowledge of use of DME;Pain       PT Treatment Interventions DME instruction;Therapeutic activities;Modalities;Gait training;Therapeutic exercise;Patient/family education;Balance training;Stair training;Functional mobility training    PT Goals (Current goals can be found in the Care Plan section)  Acute Rehab PT Goals Patient Stated Goal: decrease pain PT Goal Formulation: With patient Time For Goal Achievement: 08/01/20 Potential to Achieve Goals: Good    Frequency 7X/week   Barriers to discharge        Co-evaluation               AM-PAC PT "6 Clicks" Mobility  Outcome Measure Help needed turning from your back to your side while in a flat bed without using bedrails?: A Little Help needed moving from lying on your back to sitting on the side of a flat bed without using bedrails?: A Little Help needed moving to and from a bed to a chair (including a wheelchair)?: A Little Help needed standing up  from a chair using your arms (e.g., wheelchair or bedside chair)?: A Little Help needed to walk in hospital room?: A Little Help needed climbing 3-5 steps with a railing? : A Lot 6 Click Score: 17    End of Session Equipment Utilized During Treatment: Gait belt Activity Tolerance: Patient tolerated treatment well (c/o pain but tolerated) Patient left: in bed;with call bell/phone within reach;with bed alarm set;with SCD's reapplied (MD present) Nurse Communication: Mobility status PT Visit Diagnosis: Other abnormalities of gait and mobility (R26.89);Muscle weakness (generalized) (M62.81);Pain Pain - Right/Left: Left Pain - part of body: Knee    Time: 7035-0093 PT Time Calculation (min) (ACUTE ONLY): 45 min   Charges:   PT Evaluation $PT Eval Moderate Complexity: 1  Mod PT Treatments $Gait Training: 8-22 mins $Therapeutic Activity: 8-22 mins        Anise Salvo, PT Acute Rehab Services Pager [phone] [facility] [phone]    Rayetta Humphrey 07/18/2020, 5:58 PM

## 2020-07-18 NOTE — Care Plan (Signed)
Ortho Bundle Case Management Note  Patient Details  Name: Connie Branch MRN: [id number] Date of Birth: 1951-03-24                  L TKA on 07/18/20. DCP: Home with nephew. 2 story townhouse with 0 steps. DME: No needs. Has RW. Doesn't want 3in1. Has elevated toilets and grab bars. PT: EO 3/17   DME Arranged:  N/A DME Agency:     HH Arranged:    HH Agency:     Additional Comments: Please contact me with any questions of if this plan should need to change.  Ennis Forts, RN,CCM [facility]  [phone] 07/18/2020, 11:18 AM

## 2020-07-18 NOTE — Transfer of Care (Signed)
Immediate Anesthesia Transfer of Care Note  Patient: Connie Branch  Procedure(s) Performed: TOTAL KNEE ARTHROPLASTY (Left Knee)  Patient Location: PACU  Anesthesia Type:Spinal  Level of Consciousness: sedated, patient cooperative and responds to stimulation  Airway & Oxygen Therapy: Patient Spontanous Breathing and Patient connected to face mask oxygen  Post-op Assessment: Report given to RN and Post -op Vital signs reviewed and stable  Post vital signs: Reviewed and stable  Last Vitals:  Vitals Value Taken Time  BP 107/69 07/18/20 1007  Temp    Pulse 75 07/18/20 1009  Resp 16 07/18/20 1009  SpO2 96 % 07/18/20 1009  Vitals shown include unvalidated device data.  Last Pain:  Vitals:   07/18/20 0648  TempSrc:   PainSc: 0-No pain      Patients Stated Pain Goal: 4 (07/18/20 9326)  Complications: No complications documented.

## 2020-07-18 NOTE — Interval H&P Note (Signed)
History and Physical Interval Note:  07/18/2020 7:01 AM  Connie Branch  has presented today for surgery, with the diagnosis of left knee osteoarthritis.  The various methods of treatment have been discussed with the patient and family. After consideration of risks, benefits and other options for treatment, the patient has consented to  Procedure(s) with comments: TOTAL KNEE ARTHROPLASTY (Left) - as a surgical intervention.  The patient's history has been reviewed, patient examined, no change in status, stable for surgery.  I have reviewed the patient's chart and labs.  Questions were answered to the patient's satisfaction.     [name]

## 2020-07-18 NOTE — Progress Notes (Signed)
Assisted Dr. Miller with left, ultrasound guided, adductor canal block. Side rails up, monitors on throughout procedure. See vital signs in flow sheet. Tolerated Procedure well.  

## 2020-07-18 NOTE — Anesthesia Postprocedure Evaluation (Signed)
Anesthesia Post Note  Patient: Connie Branch  Procedure(s) Performed: TOTAL KNEE ARTHROPLASTY (Left Knee)     Patient location during evaluation: PACU Anesthesia Type: Regional Level of consciousness: awake and alert Pain management: pain level controlled Vital Signs Assessment: post-procedure vital signs reviewed and stable Respiratory status: spontaneous breathing, nonlabored ventilation and respiratory function stable Cardiovascular status: blood pressure returned to baseline and stable Postop Assessment: no apparent nausea or vomiting Anesthetic complications: no   No complications documented.  Last Vitals:  Vitals:   07/18/20 1100 07/18/20 1115  BP: (!) 158/81 (!) 152/77  Pulse: (!) 50 (!) 50  Resp: (!) 7 18  Temp:    SpO2: 100% 100%    Last Pain:  Vitals:   07/18/20 1100  TempSrc:   PainSc: 0-No pain                 Lowella Curb

## 2020-07-18 NOTE — H&P (Signed)
TOTAL KNEE ADMISSION H&P  Patient is being admitted for left total knee arthroplasty.  Subjective:  Chief Complaint:left knee pain.  HPI: Connie Branch, 70 y.o. female, has a history of pain and functional disability in the left knee due to arthritis and has failed non-surgical conservative treatments for greater than 12 weeks to includeNSAID's and/or analgesics and activity modification.  Onset of symptoms was gradual, starting 2 years ago with gradually worsening course since that time. The patient noted no past surgery on the left knee(s).  Patient currently rates pain in the left knee(s) at 8 out of 10 with activity. Patient has worsening of pain with activity and weight bearing and pain that interferes with activities of daily living.  Patient has evidence of joint space narrowing by imaging studies.There is no active infection.  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   Postoperative anemia due to acute blood loss 06/24/2017   Primary osteoarthritis of right knee 06/21/2017   Primary osteoarthritis of left knee 06/21/2017   DM (diabetes mellitus), secondary, uncontrolled, with ketoacidosis (HCC) 05/23/2013   Hypokalemia 05/22/2013   Obesity (BMI 30-39.9) 05/22/2013   DKA (diabetic ketoacidoses) 05/21/2013   Muscle spasm 05/21/2013   Hyponatremia 05/21/2013   ARF (acute renal failure) secondary to dehydration from osmotic diuresis 05/21/2013   HTN (hypertension) 05/21/2013   Hyperlipidemia 05/21/2013   GERD (gastroesophageal reflux disease) 05/21/2013   Infection due to trichomonas 05/21/2013   Past Medical History:  Diagnosis Date   Anxiety    panic attacks   ARF (acute renal failure) secondary to dehydration from osmotic diuresis 05/21/2013   KIDNEY FUNCTION NORMAL NOW   Arthritis    Cataracts, bilateral    DDD (degenerative disc disease)    DKA (diabetic ketoacidoses) 05/21/2013    TYPE 2   GERD (gastroesophageal reflux disease)    Headache    SINUS   High cholesterol    History of claustrophobia    Hypertension    Neuropathy 2015   feet   Obesity    Osteopenia     Past Surgical History:  Procedure Laterality Date   ABDOMINAL HYSTERECTOMY     COMPLETE   BREAST SURGERY Left 04/20/2020   BX   KNEE JOINT MANIPULATION Right    OVARIAN CYST AND OVARY REMOVED  YRS AGO   TOTAL KNEE ARTHROPLASTY Right 06/21/2017   Procedure: RIGHT TOTAL KNEE ARTHROPLASTY;  Surgeon: Jodi Geralds, MD;  Location: [facility];  Service: Orthopedics;  Laterality: Right;   TOTAL KNEE REVISION Right 07/22/2019   Procedure: Right knee polyethylene exchange;  Surgeon: Ollen Gross, MD;  Location: [facility];  Service: Orthopedics;  Laterality: Right;     Current Facility-Administered Medications  Medication Dose Route Frequency Provider Last Rate Last Admin   acetaminophen (OFIRMEV) IV 1,000 mg  1,000 mg Intravenous Q6H Edmisten, Kristie L, PA       bupivacaine liposome (EXPAREL) 1.3 % injection 266 mg  20 mL Other Once Ollen Gross, MD       ceFAZolin (ANCEF) IVPB 2g/100 mL premix  2 g Intravenous On Call to OR Edmisten, Kristie L, PA       dexamethasone (DECADRON) injection 8 mg  8 mg Intravenous Once Edmisten, Kristie L, PA       fentaNYL (SUBLIMAZE) injection 50-100 mcg  50-100 mcg Intravenous Once Lowella Curb, MD       lactated ringers infusion   Intravenous Continuous Edmisten, Lyn Hollingshead,  PA 75 mL/hr at 07/18/20 0650 New Bag at 07/18/20 1884   lactated ringers infusion   Intravenous Continuous Eilene Ghazi, MD       midazolam (VERSED) injection 1-2 mg  1-2 mg Intravenous UD Lowella Curb, MD       tranexamic acid (CYKLOKAPRON) IVPB 1,000 mg  1,000 mg Intravenous To OR Edmisten, Kristie L, PA       Allergies  Allergen Reactions   Erythromycin Anaphylaxis   Aspirin Nausea And Vomiting   Penicillins Other (See Comments)    Childhood allergy Has  patient had a PCN reaction causing immediate rash, facial/tongue/throat swelling, SOB or lightheadedness with hypotension: Unknown Has patient had a PCN reaction causing severe rash involving mucus membranes or skin necrosis: Unknown Has patient had a PCN reaction that required hospitalization:Unknown Has patient had a PCN reaction occurring within the last 10 years:No If all of the above answers are "NO", then may proceed with Cephalosporin use.     Social History   Tobacco Use   Smoking status: Former Smoker    Packs/day: 0.25    Years: 50.00    Pack years: 12.50    Types: Cigarettes    Quit date: 05/07/2013    Years since quitting: 7.2   Smokeless tobacco: Never Used  Substance Use Topics   Alcohol use: Not Currently    Comment: rare    Family History  Problem Relation Age of Onset   Breast cancer Neg Hx      Review of Systems  Constitutional: Negative for chills and fever.  Respiratory: Negative for cough and shortness of breath.   Cardiovascular: Negative for chest pain.  Gastrointestinal: Negative for nausea and vomiting.  Musculoskeletal: Positive for arthralgias.    Objective:  Physical Exam Patient is a 70 year old female.  Well nourished and well developed. General: Alert and oriented x3, cooperative and pleasant, no acute distress. Head: normocephalic, atraumatic, neck supple. Eyes: EOMI. Respiratory: breath sounds clear in all fields, no wheezing, rales, or rhonchi. Cardiovascular: Regular rate and rhythm, no murmurs, gallops or rubs.  Musculoskeletal:  Left Knee Exam: Mild tenderness to palpation about the medial joint line of the left knee. AROM 0-135 degrees. No effusion noted. No instability. Mild patellofemoral crepitus.  Calves soft and nontender. Motor function intact in LE. Strength 5/5 LE bilaterally. Neuro: Distal pulses 2+. Sensation to light touch intact in LE.  Vital signs in last 24 hours: Temp:  [98 F (36.7 C)] 98 F (36.7 C)  (03/14 0629) Pulse Rate:  [82] 82 (03/14 0629) Resp:  [16] 16 (03/14 0629) BP: (194)/(89) 194/89 (03/14 0629) SpO2:  [100 %] 100 % (03/14 0629) Weight:  [71.1 kg] 71.1 kg (03/14 0648)  Labs:   Estimated body mass index is 27.78 kg/m as calculated from the following:   Height as of this encounter: 5\' 3"  (1.6 m).   Weight as of this encounter: 71.1 kg.   Imaging Review Plain radiographs demonstrate severe degenerative joint disease of the left knee(s). The overall alignment isneutral. The bone quality appears to be adequate for age and reported activity level.      Assessment/Plan:  End stage arthritis, left knee   The patient history, physical examination, clinical judgment of the provider and imaging studies are consistent with end stage degenerative joint disease of the left knee(s) and total knee arthroplasty is deemed medically necessary. The treatment options including medical management, injection therapy arthroscopy and arthroplasty were discussed at length. The risks and benefits of total knee  arthroplasty were presented and reviewed. The risks due to aseptic loosening, infection, stiffness, patella tracking problems, thromboembolic complications and other imponderables were discussed. The patient acknowledged the explanation, agreed to proceed with the plan and consent was signed. Patient is being admitted for inpatient treatment for surgery, pain control, PT, OT, prophylactic antibiotics, VTE prophylaxis, progressive ambulation and ADL's and discharge planning. The patient is planning to be discharged home  Therapy Plans: [facility] Disposition: Home with nephew Planned DVT Prophylaxis: Xarelto (Allergy to ASA) DME Needed: None PCP: Sigmund Hazel, clearance received TXA: IV Allergies: PCN (Erythromycin) - unknown reaction Anesthesia Concerns: Nausea BMI: 27.2 Last HgbA1c: 5.2 Pharmacy: [facility])  Other: -Oxycodone is okay  - Patient was instructed on what  medications to stop prior to surgery. - Follow-up visit in 2 weeks with Dr. Lequita Halt - Begin physical therapy following surgery - Pre-operative lab work as pre-surgical testing - Prescriptions will be provided in hospital at time of discharge    Patient's anticipated LOS is less than 2 midnights, meeting these requirements: - Younger than 47 - Lives within 1 hour of care - Has a competent adult at home to recover with post-op recover - NO history of  - Chronic pain requiring opiods  - Diabetes  - Coronary Artery Disease  - Heart failure  - Heart attack  - Stroke  - DVT/VTE  - Cardiac arrhythmia  - Respiratory Failure/COPD  - Renal failure  - Anemia  - Advanced Liver disease   Dennie Bible, PA-C Orthopedic Surgery [facility] [place] [phone]

## 2020-07-18 NOTE — Op Note (Signed)
OPERATIVE REPORT-TOTAL KNEE ARTHROPLASTY   Pre-operative diagnosis- Osteoarthritis  Left knee(s)  Post-operative diagnosis- Osteoarthritis Left knee(s)  Procedure-  Left  Total Knee Arthroplasty  Surgeon- Connie Rankin. Davidlee Jeanbaptiste, MD  Assistant- Nelia Shi, PA-C   Anesthesia-  Adductor canal block and spinal  EBL-25 mL   Drains None  Tourniquet time-  Total Tourniquet Time Documented: Thigh (Left) - 37 minutes Total: Thigh (Left) - 37 minutes     Complications- None  Condition-PACU - hemodynamically stable.   Brief Clinical Note  Connie Branch is a 70 y.o. year old female with end stage OA of her left knee with progressively worsening pain and dysfunction. She has constant pain, with activity and at rest and significant functional deficits with difficulties even with ADLs. She has had extensive non-op management including analgesics, injections of cortisone and viscosupplements, and home exercise program, but remains in significant pain with significant dysfunction. Radiographs show bone on bone arthritis medial and patellofemoral. She presents now for left Total Knee Arthroplasty.    Procedure in detail---   The patient is brought into the operating room and positioned supine on the operating table. After successful administration of  Adductor canal block and spinal,   a tourniquet is placed high on the  Left thigh(s) and the lower extremity is prepped and draped in the usual sterile fashion. Time out is performed by the operating team and then the  Left lower extremity is wrapped in Esmarch, knee flexed and the tourniquet inflated to 300 mmHg.       A midline incision is made with a ten blade through the subcutaneous tissue to the level of the extensor mechanism. A fresh blade is used to make a medial parapatellar arthrotomy. Soft tissue over the proximal medial tibia is subperiosteally elevated to the joint line with a knife and into the semimembranosus bursa with a Cobb  elevator. Soft tissue over the proximal lateral tibia is elevated with attention being paid to avoiding the patellar tendon on the tibial tubercle. The patella is everted, knee flexed 90 degrees and the ACL and PCL are removed. Findings are bone on bone medial and patellofemoral with large global osteophytes.        The drill is used to create a starting hole in the distal femur and the canal is thoroughly irrigated with sterile saline to remove the fatty contents. The 5 degree Left  valgus alignment guide is placed into the femoral canal and the distal femoral cutting block is pinned to remove 9 mm off the distal femur. Resection is made with an oscillating saw.      The tibia is subluxed forward and the menisci are removed. The extramedullary alignment guide is placed referencing proximally at the medial aspect of the tibial tubercle and distally along the second metatarsal axis and tibial crest. The block is pinned to remove 75mm off the more deficient medial  side. Resection is made with an oscillating saw. Size 5is the most appropriate size for the tibia and the proximal tibia is prepared with the modular drill and keel punch for that size.      The femoral sizing guide is placed and size 5 is most appropriate. Rotation is marked off the epicondylar axis and confirmed by creating a rectangular flexion gap at 90 degrees. The size 5 cutting block is pinned in this rotation and the anterior, posterior and chamfer cuts are made with the oscillating saw. The intercondylar block is then placed and that cut is made.  Trial size 5 tibial component, trial size 5 narrow posterior stabilized femur and a 8  mm posterior stabilized rotating platform insert trial is placed. Full extension is achieved with excellent varus/valgus and anterior/posterior balance throughout full range of motion. The patella is everted and thickness measured to be 21  mm. Free hand resection is taken to 12 mm, a 32 template is placed, lug  holes are drilled, trial patella is placed, and it tracks normally. Osteophytes are removed off the posterior femur with the trial in place. All trials are removed and the cut bone surfaces prepared with pulsatile lavage. Cement is mixed and once ready for implantation, the size 5 tibial implant, size  5 narrow posterior stabilized femoral component, and the size 32 patella are cemented in place and the patella is held with the clamp. The trial insert is placed and the knee held in full extension. The Exparel (20 ml mixed with 60 ml saline) is injected into the extensor mechanism, posterior capsule, medial and lateral gutters and subcutaneous tissues.  All extruded cement is removed and once the cement is hard the permanent 8 mm posterior stabilized rotating platform insert is placed into the tibial tray.      The wound is copiously irrigated with saline solution and the extensor mechanism closed with # 0 Stratofix suture. The tourniquet is released for a total tourniquet time of 37  minutes. Flexion against gravity is 140 degrees and the patella tracks normally. Subcutaneous tissue is closed with 2.0 vicryl and subcuticular with running 4.0 Monocryl. The incision is cleaned and dried and steri-strips and a bulky sterile dressing are applied. The limb is placed into a knee immobilizer and the patient is awakened and transported to recovery in stable condition.      Please note that a surgical assistant was a medical necessity for this procedure in order to perform it in a safe and expeditious manner. Surgical assistant was necessary to retract the ligaments and vital neurovascular structures to prevent injury to them and also necessary for proper positioning of the limb to allow for anatomic placement of the prosthesis.   Connie Rankin Hurbert Duran, MD    07/18/2020, 9:37 AM

## 2020-07-19 ENCOUNTER — Encounter (HOSPITAL_COMMUNITY): Payer: Self-pay | Admitting: Orthopedic Surgery

## 2020-07-19 DIAGNOSIS — M1712 Unilateral primary osteoarthritis, left knee: Secondary | ICD-10-CM | POA: Diagnosis not present

## 2020-07-19 LAB — BASIC METABOLIC PANEL WITH GFR
Anion gap: 8 (ref 5–15)
BUN: 19 mg/dL (ref 8–23)
CO2: 24 mmol/L (ref 22–32)
Calcium: 8 mg/dL — ABNORMAL LOW (ref 8.9–10.3)
Chloride: 104 mmol/L (ref 98–111)
Creatinine, Ser: 0.79 mg/dL (ref 0.44–1.00)
GFR, Estimated: >60 mL/min
Glucose, Bld: 166 mg/dL — ABNORMAL HIGH (ref 70–99)
Potassium: 3.3 mmol/L — ABNORMAL LOW (ref 3.5–5.1)
Sodium: 136 mmol/L (ref 135–145)

## 2020-07-19 LAB — GLUCOSE, CAPILLARY
Glucose-Capillary: 101 mg/dL — ABNORMAL HIGH (ref 70–99)
Glucose-Capillary: 149 mg/dL — ABNORMAL HIGH (ref 70–99)
Glucose-Capillary: 156 mg/dL — ABNORMAL HIGH (ref 70–99)
Glucose-Capillary: 119 mg/dL — ABNORMAL HIGH (ref 70–99)

## 2020-07-19 LAB — CBC
HCT: 30 % — ABNORMAL LOW (ref 36.0–46.0)
Hemoglobin: 9.8 g/dL — ABNORMAL LOW (ref 12.0–15.0)
MCH: 29.7 pg (ref 26.0–34.0)
MCHC: 32.7 g/dL (ref 30.0–36.0)
MCV: 90.9 fL (ref 80.0–100.0)
Platelets: 108 10*3/uL — ABNORMAL LOW (ref 150–400)
RBC: 3.3 MIL/uL — ABNORMAL LOW (ref 3.87–5.11)
RDW: 14.1 % (ref 11.5–15.5)
WBC: 13.1 10*3/uL — ABNORMAL HIGH (ref 4.0–10.5)
nRBC: 0 % (ref 0.0–0.2)

## 2020-07-19 MED ORDER — HYDROCODONE-ACETAMINOPHEN 5-325 MG PO TABS
1.0000 | ORAL_TABLET | Freq: Four times a day (QID) | ORAL | 0 refills | Status: DC | PRN
Start: 2020-07-19 — End: 2020-08-16

## 2020-07-19 MED ORDER — METHOCARBAMOL 500 MG PO TABS: 500.0000 mg | ORAL_TABLET | Freq: Four times a day (QID) | ORAL | 0 refills | Status: DC | PRN

## 2020-07-19 MED ORDER — POTASSIUM CHLORIDE CRYS ER 20 MEQ PO TBCR
40.0000 meq | EXTENDED_RELEASE_TABLET | ORAL | Status: AC
  Administered 2020-07-19 (×2): 40 meq via ORAL
  Filled 2020-07-19 (×2): qty 2

## 2020-07-19 MED ORDER — GABAPENTIN 100 MG PO CAPS: ORAL_CAPSULE | ORAL | 0 refills | Status: DC

## 2020-07-19 MED ORDER — TRAMADOL HCL 50 MG PO TABS: 50.0000 mg | ORAL_TABLET | Freq: Four times a day (QID) | ORAL | 0 refills | Status: DC | PRN

## 2020-07-19 MED ORDER — HYDROCODONE-ACETAMINOPHEN 5-325 MG PO TABS: 1.0000 | ORAL_TABLET | Freq: Four times a day (QID) | ORAL | 0 refills | Status: DC | PRN

## 2020-07-19 MED ORDER — RIVAROXABAN 10 MG PO TABS: 10.0000 mg | ORAL_TABLET | Freq: Every day | ORAL | 0 refills | Status: DC

## 2020-07-19 NOTE — Progress Notes (Signed)
Subjective: 1 Day Post-Op Procedure(s) (LRB): TOTAL KNEE ARTHROPLASTY (Left) Patient reports pain as mild.   Patient seen in rounds by Dr. Lequita Halt. Patient is well, and has had no acute complaints or problems other than pain in the left knee. Denies chest pain, SOB, or calf pain. No issues overnight.  We will continue therapy today, ambulated 100' yesterday.   Objective: Vital signs in last 24 hours: Temp:  [97.7 F (36.5 C)-98.6 F (37 C)] 98.5 F (36.9 C) (03/15 0624) Pulse Rate:  [50-88] 70 (03/15 0624) Resp:  [5-25] 16 (03/15 0624) BP: (107-190)/(63-100) 143/84 (03/15 0624) SpO2:  [78 %-100 %] 99 % (03/15 0624)  Intake/Output from previous day:  Intake/Output Summary (Last 24 hours) at 07/19/2020 0740 Last data filed at 07/19/2020 0624 Gross per 24 hour  Intake 4920.97 ml  Output 3025 ml  Net 1895.97 ml     Intake/Output this shift: No intake/output data recorded.  Labs: Recent Labs    07/19/20 0322  HGB 9.8*   Recent Labs    07/19/20 0322  WBC 13.1*  RBC 3.30*  HCT 30.0*  PLT 108*   Recent Labs    07/19/20 0322  NA 136  K 3.3*  CL 104  CO2 24  BUN 19  CREATININE 0.79  GLUCOSE 166*  CALCIUM 8.0*   No results for input(s): LABPT, INR in the last 72 hours.  Exam: General - Patient is Alert and Oriented Extremity - Neurologically intact Neurovascular intact Sensation intact distally Dorsiflexion/Plantar flexion intact Dressing - dressing C/D/I Motor Function - intact, moving foot and toes well on exam.   Past Medical History:  Diagnosis Date  . Anxiety    panic attacks  . ARF (acute renal failure) secondary to dehydration from osmotic diuresis 05/21/2013   KIDNEY FUNCTION NORMAL NOW  . Arthritis   . Cataracts, bilateral   . DDD (degenerative disc disease)   . 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 GERD (gastroesophageal reflux disease)   . Headache    SINUS  . High cholesterol   . History of claustrophobia   .  Hypertension   . Neuropathy 2015   feet  . Obesity   . Osteopenia     Assessment/Plan: 1 Day Post-Op Procedure(s) (LRB): TOTAL KNEE ARTHROPLASTY (Left) Principal Problem:   Primary osteoarthritis of left knee  Estimated body mass index is 27.78 kg/m as calculated from the following:   Height as of this encounter: 5\' 3"  (1.6 m).   Weight as of this encounter: 71.1 kg. Advance diet Up with therapy D/C IV fluids   Patient's anticipated LOS is less than 2 midnights, meeting these requirements: - Younger than 51 - Lives within 1 hour of care - Has a competent adult at home to recover with post-op recover - NO history of  - Chronic pain requiring opiods  - Diabetes  - Coronary Artery Disease  - Heart failure  - Heart attack  - Stroke  - DVT/VTE  - Cardiac arrhythmia  - Respiratory Failure/COPD  - Renal failure  - Anemia  - Advanced Liver disease  DVT Prophylaxis - Xarelto Weight bearing as tolerated. Continue therapy.  Potassium 3.3 this AM, two doses of 40 mEq KCl ordered.  Plan is to go Home after hospital stay. Plan for discharge later today assuming she is cleared by physical therapy. Scheduled for OPPT at [facility]. Follow-up in the office in 2 weeks  The PDMP database was reviewed today prior to any opioid medications  being prescribed to this patient.  Arther Abbott, PA-C Orthopedic Surgery [phone] 07/19/2020, 7:40 AM

## 2020-07-19 NOTE — TOC Transition Note (Signed)
Transition of Care [facility]) - CM/SW Discharge Note  Patient Details  Name: Connie Branch MRN: [id number] Date of Birth: 1951/04/29  Transition of Care [facility]) CM/SW Contact:  Ewing Schlein, LCSW Phone Number: 07/19/2020, 11:06 AM  Clinical Narrative: Patient stable for discharge. Patient is an ortho bundle; patient has a rolling walker at home and declined a 3N1. Patient has been referred to [facility] for OPPT. No additional DME needs at this time. TOC signing off.  Final next level of care: OP Rehab Barriers to Discharge: Barriers Resolved  Patient Goals and CMS Choice Patient states their goals for this hospitalization and ongoing recovery are:: Discharge home Choice offered to / list presented to : Patient  Discharge Plan and Services      DME Arranged: N/A DME Agency: NA  Readmission Risk Interventions No flowsheet data found.

## 2020-07-19 NOTE — Progress Notes (Signed)
Ambulated patient to BRP and return to bed. Reports pain 8/10, sharp/aching. Patient feels that the ace wrap and fluff is too tight and is irritating the nerves in her legs. Patient has a hx of neuropathy and spinal nerve injury and believes that the ace and ted hose are "hitting the nerve" and increasing pain. RN removed ace wrap and fluff. Patient reports feeling immediate relief, down to 6/10 and pain is more of a deep ache. Will cont to treat and monitor.

## 2020-07-19 NOTE — Progress Notes (Signed)
Physical Therapy Treatment Patient Details Name: Connie Branch MRN: [id number] DOB: Sep 20, 1950 Today's Date: 07/19/2020    History of Present Illness Pt is 70 yo female s/p L TKA on 07/18/20.  Pt with PMH including neuropathy, HTN, claustrophobia, arthritis, anxiety, R TKA 19 with manipulation and revision in 2021.    PT Comments    POD # 1 am session Assisted OOB.  General bed mobility comments: demonstarted and instructed on how to use a belt to self assist LE off bed.  Required increased time.  Required repeat instruction due to distraction (pain).  General transfer comment: Min A and increased time to rise; cues for hand placement and L LE management; min A to extend L LE with sitting to ease pain.  Also assisted with a toilet transfer.General Gait Details: 25% VC's on proper upright posture and safety with turns.  Required increased time.  C/O 8/10 knee pain "tightness".  Excessive WBing thru walker. Performed a few TE's but limited by pain.   Follow Up Recommendations  Follow surgeon's recommendation for DC plan and follow-up therapies;Supervision for mobility/OOB     Equipment Recommendations  None recommended by PT    Recommendations for Other Services       Precautions / Restrictions Precautions Precautions: Fall Restrictions Weight Bearing Restrictions: No    Mobility  Bed Mobility Overal bed mobility: Needs Assistance Bed Mobility: Supine to Sit     Supine to sit: Min assist     General bed mobility comments: demonstarted and instructed on how to use a belt to self assist LE off bed.  Required increased time.  Required repeat instruction due to distraction (pain)    Transfers Overall transfer level: Needs assistance Equipment used: Rolling walker (2 wheeled) Transfers: Sit to/from Stand Sit to Stand: Min assist;From elevated surface         General transfer comment: Min A and increased time to rise; cues for hand placement and L LE management; min A to  extend L LE with sitting to ease pain.  Also assisted with a toilet transfer.  Ambulation/Gait Ambulation/Gait assistance: Min guard Gait Distance (Feet): 65 Feet Assistive device: Rolling walker (2 wheeled) Gait Pattern/deviations: Step-to pattern;Decreased stride length Gait velocity: decreased   General Gait Details: 25% VC's on proper upright posture and safety with turns.  Required increased time.  C/O 8/10 knee pain "tightness".  Excessive WBing thru walker.   Stairs             Wheelchair Mobility    Modified Rankin (Stroke Patients Only)       Balance                                            Cognition Arousal/Alertness: Awake/alert Behavior During Therapy: WFL for tasks assessed/performed Overall Cognitive Status: Within Functional Limits for tasks assessed                                 General Comments: AxO x 3      Exercises  10 reps AP and knee presses     General Comments        Pertinent Vitals/Pain Pain Assessment: 0-10 Pain Score: 8  Pain Location: L knee Pain Descriptors / Indicators: Discomfort;Sore Pain Intervention(s): Monitored during session;Premedicated before session;Repositioned    Home Living  Prior Function            PT Goals (current goals can now be found in the care plan section) Progress towards PT goals: Progressing toward goals    Frequency    7X/week      PT Plan Current plan remains appropriate    Co-evaluation              AM-PAC PT "6 Clicks" Mobility   Outcome Measure  Help needed turning from your back to your side while in a flat bed without using bedrails?: A Little Help needed moving from lying on your back to sitting on the side of a flat bed without using bedrails?: A Little Help needed moving to and from a bed to a chair (including a wheelchair)?: A Little Help needed standing up from a chair using your arms (e.g.,  wheelchair or bedside chair)?: A Little Help needed to walk in hospital room?: A Little Help needed climbing 3-5 steps with a railing? : A Lot 6 Click Score: 17    End of Session Equipment Utilized During Treatment: Gait belt Activity Tolerance: Patient tolerated treatment well Patient left: in chair;with call bell/phone within reach Nurse Communication: Mobility status PT Visit Diagnosis: Other abnormalities of gait and mobility (R26.89);Muscle weakness (generalized) (M62.81);Pain Pain - Right/Left: Left Pain - part of body: Knee     Time: 1000-1027 PT Time Calculation (min) (ACUTE ONLY): 27 min  Charges:  $Gait Training: 8-22 mins $Therapeutic Activity: 8-22 mins                     Felecia Shelling  PTA Acute  Rehabilitation Services Pager      [phone] Office      [phone]

## 2020-07-19 NOTE — Progress Notes (Signed)
Physical Therapy Treatment Patient Details Name: Connie Branch MRN: [id number] DOB: 1951/01/20 Today's Date: 07/19/2020    History of Present Illness Pt is 70 yo female s/p L TKA on 07/18/20.  Pt with PMH including neuropathy, HTN, claustrophobia, arthritis, anxiety, R TKA 19 with manipulation and revision in 2021.    PT Comments    POD # 1 pm session Pt progressing slowly due to pain control.  Unable to attempt stairs.  Pt has not met her goals to D/C to home today.   Follow Up Recommendations  Follow surgeon's recommendation for DC plan and follow-up therapies;Supervision for mobility/OOB     Equipment Recommendations  None recommended by PT    Recommendations for Other Services       Precautions / Restrictions Precautions Precautions: Fall Restrictions Weight Bearing Restrictions: No    Mobility  Bed Mobility Overal bed mobility: Needs Assistance Bed Mobility: Supine to Sit;Sit to Supine     Supine to sit: Min assist     General bed mobility comments: demonstarted and instructed on how to use a belt to self assist LE off bed.  Required increased time. Also asssisted back to bed per pt request.    Transfers Overall transfer level: Needs assistance Equipment used: Rolling walker (2 wheeled) Transfers: Sit to/from Stand Sit to Stand: Min assist;From elevated surface         General transfer comment: Increased time due to pain and "tightness"  Ambulation/Gait Ambulation/Gait assistance: Min guard Gait Distance (Feet): 110 Feet Assistive device: Rolling walker (2 wheeled) Gait Pattern/deviations: Step-to pattern;Decreased stride length Gait velocity: decreased   General Gait Details: 25% VC's on proper upright posture and safety with turns.  Required increased time.  C/O 8/10 knee pain "tightness".  Excessive WBing thru walker. Tolerated an increased distance.   Stairs             Wheelchair Mobility    Modified Rankin (Stroke Patients Only)        Balance                                            Cognition Arousal/Alertness: Awake/alert Behavior During Therapy: WFL for tasks assessed/performed Overall Cognitive Status: Within Functional Limits for tasks assessed                                 General Comments: AxO x 3      Exercises      General Comments        Pertinent Vitals/Pain Pain Assessment: 0-10 Pain Score: 8  Pain Location: L knee Pain Descriptors / Indicators: Discomfort;Sore Pain Intervention(s): Monitored during session;Premedicated before session;Repositioned    Home Living                      Prior Function            PT Goals (current goals can now be found in the care plan section) Progress towards PT goals: Progressing toward goals    Frequency    7X/week      PT Plan Current plan remains appropriate    Co-evaluation              AM-PAC PT "6 Clicks" Mobility   Outcome Measure  Help needed turning from your back to your side while in  a flat bed without using bedrails?: A Little Help needed moving from lying on your back to sitting on the side of a flat bed without using bedrails?: A Little Help needed moving to and from a bed to a chair (including a wheelchair)?: A Little Help needed standing up from a chair using your arms (e.g., wheelchair or bedside chair)?: A Little Help needed to walk in hospital room?: A Little Help needed climbing 3-5 steps with a railing? : A Lot 6 Click Score: 17    End of Session Equipment Utilized During Treatment: Gait belt Activity Tolerance: Patient tolerated treatment well Patient left: in chair;with call bell/phone within reach Nurse Communication: Mobility status PT Visit Diagnosis: Other abnormalities of gait and mobility (R26.89);Muscle weakness (generalized) (M62.81);Pain Pain - Right/Left: Left Pain - part of body: Knee     Time: 1435-1500 PT Time Calculation (min) (ACUTE  ONLY): 25 min  Charges:  $Gait Training: 8-22 mins $Therapeutic Activity: 8-22 mins                     Rica Koyanagi  PTA Acute  Rehabilitation Services Pager      [phone] Office      [phone]

## 2020-07-20 DIAGNOSIS — M1712 Unilateral primary osteoarthritis, left knee: Secondary | ICD-10-CM | POA: Diagnosis not present

## 2020-07-20 LAB — CBC
HCT: 28.9 % — ABNORMAL LOW (ref 36.0–46.0)
Hemoglobin: 9.5 g/dL — ABNORMAL LOW (ref 12.0–15.0)
MCH: 29.8 pg (ref 26.0–34.0)
MCHC: 32.9 g/dL (ref 30.0–36.0)
MCV: 90.6 fL (ref 80.0–100.0)
Platelets: 105 10*3/uL — ABNORMAL LOW (ref 150–400)
RBC: 3.19 MIL/uL — ABNORMAL LOW (ref 3.87–5.11)
RDW: 14.5 % (ref 11.5–15.5)
WBC: 12 10*3/uL — ABNORMAL HIGH (ref 4.0–10.5)
nRBC: 0 % (ref 0.0–0.2)

## 2020-07-20 LAB — BASIC METABOLIC PANEL
Anion gap: 10 (ref 5–15)
BUN: 16 mg/dL (ref 8–23)
CO2: 25 mmol/L (ref 22–32)
Calcium: 8.5 mg/dL — ABNORMAL LOW (ref 8.9–10.3)
Chloride: 100 mmol/L (ref 98–111)
Creatinine, Ser: 0.99 mg/dL (ref 0.44–1.00)
GFR, Estimated: >60 mL/min (ref >60)
Glucose, Bld: 114 mg/dL — ABNORMAL HIGH (ref 70–99)
Potassium: 3.6 mmol/L (ref 3.5–5.1)
Sodium: 135 mmol/L (ref 135–145)

## 2020-07-20 LAB — GLUCOSE, CAPILLARY
Glucose-Capillary: 112 mg/dL — ABNORMAL HIGH (ref 70–99)
Glucose-Capillary: 125 mg/dL — ABNORMAL HIGH (ref 70–99)

## 2020-07-20 NOTE — Progress Notes (Signed)
Physical Therapy Treatment Patient Details Name: Connie Branch MRN: [id number] DOB: 1950-09-23 Today's Date: 07/20/2020    History of Present Illness Pt is 70 yo female s/p L TKA on 07/18/20.  Pt with PMH including neuropathy, HTN, claustrophobia, arthritis, anxiety, R TKA 19 with manipulation and revision in 2021.    PT Comments    Pt requires increased time for mobility due to pain.  Pt reports being fatigued from ambulating in hallway and prefers to practice steps this afternoon.   Follow Up Recommendations  Follow surgeon's recommendation for DC plan and follow-up therapies;Supervision for mobility/OOB     Equipment Recommendations  None recommended by PT    Recommendations for Other Services       Precautions / Restrictions Precautions Precautions: Fall;Knee Restrictions LLE Weight Bearing: Weight bearing as tolerated    Mobility  Bed Mobility Overal bed mobility: Needs Assistance Bed Mobility: Supine to Sit;Sit to Supine     Supine to sit: Min assist Sit to supine: Min assist   General bed mobility comments: pt would not use gait belt to self assist LE and requested therapist assist due to pain; requires increased time    Transfers Overall transfer level: Needs assistance Equipment used: Rolling walker (2 wheeled) Transfers: Sit to/from Stand Sit to Stand: Min guard         General transfer comment: min/guard for safety, cues for bringing Lt LE forward for pain control as pt does not tolerate flexion (did not sit on BSC over toilet due to pain from sitting)  Ambulation/Gait Ambulation/Gait assistance: Min guard Gait Distance (Feet): 110 Feet Assistive device: Rolling walker (2 wheeled) Gait Pattern/deviations: Step-to pattern;Decreased stride length Gait velocity: decreased   General Gait Details: verbal cues for sequence and allowing knee flexion however pt mostly keeps Lt LE straight for pain control, increased reliance on UE support through  [employer] Mobility    Modified Rankin (Stroke Patients Only)       Balance                                            Cognition Arousal/Alertness: Awake/alert Behavior During Therapy: WFL for tasks assessed/performed Overall Cognitive Status: Within Functional Limits for tasks assessed                                        Exercises      General Comments        Pertinent Vitals/Pain Pain Assessment: 0-10 Pain Score: 7  Pain Location: L knee Pain Descriptors / Indicators: Discomfort;Sore Pain Intervention(s): Repositioned;Monitored during session    Home Living                      Prior Function            PT Goals (current goals can now be found in the care plan section) Progress towards PT goals: Progressing toward goals    Frequency    7X/week      PT Plan Current plan remains appropriate    Co-evaluation              AM-PAC PT "6 Clicks" Mobility   Outcome Measure  Help needed turning from  your back to your side while in a flat bed without using bedrails?: A Little Help needed moving from lying on your back to sitting on the side of a flat bed without using bedrails?: A Little Help needed moving to and from a bed to a chair (including a wheelchair)?: A Little Help needed standing up from a chair using your arms (e.g., wheelchair or bedside chair)?: A Little Help needed to walk in hospital room?: A Little Help needed climbing 3-5 steps with a railing? : A Lot 6 Click Score: 17    End of Session Equipment Utilized During Treatment: Gait belt Activity Tolerance: Patient tolerated treatment well Patient left: in bed;with call bell/phone within reach Nurse Communication: Mobility status PT Visit Diagnosis: Other abnormalities of gait and mobility (R26.89);Muscle weakness (generalized) (M62.81);Pain Pain - Right/Left: Left Pain - part of body: Knee     Time:  0175-1025 PT Time Calculation (min) (ACUTE ONLY): 28 min  Charges:  $Gait Training: 23-37 mins                    Thomasene Mohair PT, DPT Acute Rehabilitation Services Pager: [phone] Office: [phone]  Yasmene Salomone,KATHrine E 07/20/2020, 1:16 PM

## 2020-07-20 NOTE — Progress Notes (Signed)
Orthopedic Tech Progress Note Patient Details:  Connie Branch 06/05/1950 [id number]  Patient ID: [name], female   DOB: 12-Nov-1950, 70 y.o.   MRN: [id number] I just spoke with pt's PT who is about to go in and work with her.  I will attempt to put CPM on later this PM.  Thanks,  Corinna Capra, PT, DPT  Acute Rehabilitation [phone] pager #[phone] office      Lurena Joiner B Brandelyn Henne 07/20/2020, 10:32 AM

## 2020-07-20 NOTE — Plan of Care (Signed)
Patient discharged home in stable condition 

## 2020-07-20 NOTE — Progress Notes (Signed)
Physical Therapy Treatment Patient Details Name: Connie Branch MRN: [id number] DOB: Feb 01, 1951 Today's Date: 07/20/2020    History of Present Illness Pt is 70 yo female s/p L TKA on 07/18/20.  Pt with PMH including neuropathy, HTN, claustrophobia, arthritis, anxiety, R TKA 19 with manipulation and revision in 2021.    PT Comments    Pt assisted to bathroom again and then ambulated to/from stairs.  Pt slow with mobility and requires specific technique/assist for pain control.  Pt able to perform stairs a couple times with rail both forward and backwards. Pt's bedroom upstairs, so she plans to perform stairs at home.  Pt educated to have assist available for safety and able to take home gait belt.  Pt states her nephew will be taking her home.  Pt states she has two RWs so she can have one on each level.  Pt feels ready to d/c home today.     Follow Up Recommendations  Follow surgeon's recommendation for DC plan and follow-up therapies;Supervision for mobility/OOB     Equipment Recommendations  None recommended by PT    Recommendations for Other Services       Precautions / Restrictions Precautions Precautions: Fall;Knee Restrictions LLE Weight Bearing: Weight bearing as tolerated    Mobility  Bed Mobility Overal bed mobility: Needs Assistance Bed Mobility: Supine to Sit;Sit to Supine     Supine to sit: Min guard Sit to supine: Min assist   General bed mobility comments: cues for self assist of Lt LE with UEs, required assist for LE onto bed due to pain    Transfers Overall transfer level: Needs assistance Equipment used: Rolling walker (2 wheeled) Transfers: Sit to/from Stand Sit to Stand: Min guard         General transfer comment: min/guard for safety, cues for bringing Lt LE forward for pain control as pt does not tolerate flexion (did not sit on BSC over toilet due to pain from sitting)  Ambulation/Gait Ambulation/Gait assistance: Min guard Gait Distance  (Feet): 60 Feet Assistive device: Rolling walker (2 wheeled) Gait Pattern/deviations: Step-to pattern;Decreased stride length Gait velocity: decreased   General Gait Details: verbal cues for sequence, increased reliance on UE support through RW   Stairs Stairs: Yes Stairs assistance: Min guard Stair Management: Step to pattern;Backwards;Forwards;One rail Left Number of Stairs: 2 General stair comments: pt performed with bil rails and then again with only left rail ascending semisideways and descending backwards for best pain control   Wheelchair Mobility    Modified Rankin (Stroke Patients Only)       Balance                                            Cognition Arousal/Alertness: Awake/alert Behavior During Therapy: WFL for tasks assessed/performed Overall Cognitive Status: Within Functional Limits for tasks assessed                                        Exercises      General Comments        Pertinent Vitals/Pain Pain Assessment: 0-10 Pain Score: 6  Pain Location: L knee Pain Descriptors / Indicators: Discomfort;Sore Pain Intervention(s): Repositioned;Monitored during session    Home Living  Prior Function            PT Goals (current goals can now be found in the care plan section) Progress towards PT goals: Progressing toward goals    Frequency    7X/week      PT Plan Current plan remains appropriate    Co-evaluation              AM-PAC PT "6 Clicks" Mobility   Outcome Measure  Help needed turning from your back to your side while in a flat bed without using bedrails?: A Little Help needed moving from lying on your back to sitting on the side of a flat bed without using bedrails?: A Little Help needed moving to and from a bed to a chair (including a wheelchair)?: A Little Help needed standing up from a chair using your arms (e.g., wheelchair or bedside chair)?: A  Little Help needed to walk in hospital room?: A Little Help needed climbing 3-5 steps with a railing? : A Little 6 Click Score: 18    End of Session Equipment Utilized During Treatment: Gait belt Activity Tolerance: Patient tolerated treatment well Patient left: in bed;with call bell/phone within reach Nurse Communication: Mobility status PT Visit Diagnosis: Other abnormalities of gait and mobility (R26.89);Muscle weakness (generalized) (M62.81);Pain Pain - Right/Left: Left Pain - part of body: Knee     Time: 9702-6378 PT Time Calculation (min) (ACUTE ONLY): 29 min  Charges:  $Gait Training: 23-37 mins                     Thomasene Mohair PT, DPT Acute Rehabilitation Services Pager: [phone] Office: [phone]   Maida Sale E 07/20/2020, 3:11 PM

## 2020-07-20 NOTE — Progress Notes (Signed)
   Subjective: 2 Days Post-Op Procedure(s) (LRB): TOTAL KNEE ARTHROPLASTY (Left) Patient reports pain as moderate, but improving. Patient seen in rounds for Dr. Lequita Halt. Patient is well, and has had no acute complaints or problems, other than continued pain in her left knee, which she says has improved since she increased her pain medicine. Denies SOB, chest pain, or calf pain. No acute issues overnight.  We will continue physical therapy today. Ambulated 110 feet yesterday with PT, but she was unable to attempt stairs due to excessive pain. Plan is to go Home after hospital stay.  Objective: Vital signs in last 24 hours: Temp:  [98.4 F (36.9 C)-99 F (37.2 C)] 99 F (37.2 C) (03/16 0553) Pulse Rate:  [73-87] 87 (03/16 0553) Resp:  [14-18] 14 (03/16 0553) BP: (128-147)/(68-78) 128/68 (03/16 0553) SpO2:  [97 %-99 %] 97 % (03/16 0553)  Intake/Output from previous day:  Intake/Output Summary (Last 24 hours) at 07/20/2020 0754 Last data filed at 07/20/2020 0600 Gross per 24 hour  Intake 1140 ml  Output 400 ml  Net 740 ml    Intake/Output this shift: No intake/output data recorded.  Labs: Recent Labs    07/19/20 0322 07/20/20 0317  HGB 9.8* 9.5*   Recent Labs    07/19/20 0322 07/20/20 0317  WBC 13.1* 12.0*  RBC 3.30* 3.19*  HCT 30.0* 28.9*  PLT 108* 105*   Recent Labs    07/19/20 0322 07/20/20 0317  NA 136 135  K 3.3* 3.6  CL 104 100  CO2 24 25  BUN 19 16  CREATININE 0.79 0.99  GLUCOSE 166* 114*  CALCIUM 8.0* 8.5*   No results for input(s): LABPT, INR in the last 72 hours.  Exam: General - Patient is Alert and Oriented Extremity - Neurologically intact Neurovascular intact Intact pulses distally Dorsiflexion/Plantar flexion intact Dressing/Incision - clean, dry, no drainage Motor Function - intact, moving foot and toes well on exam.   Past Medical History:  Diagnosis Date  . Anxiety    panic attacks  . ARF (acute renal failure) secondary to  dehydration from osmotic diuresis 05/21/2013   KIDNEY FUNCTION NORMAL NOW  . Arthritis   . Cataracts, bilateral   . DDD (degenerative disc disease)   . 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 GERD (gastroesophageal reflux disease)   . Headache    SINUS  . High cholesterol   . History of claustrophobia   . Hypertension   . Neuropathy 2015   feet  . Obesity   . Osteopenia     Assessment/Plan: 2 Days Post-Op Procedure(s) (LRB): TOTAL KNEE ARTHROPLASTY (Left) Principal Problem:   Primary osteoarthritis of left knee  Estimated body mass index is 27.78 kg/m as calculated from the following:   Height as of this encounter: 5\' 3"  (1.6 m).   Weight as of this encounter: 71.1 kg. Up with therapy  DVT Prophylaxis - Xarelto Weight-bearing as tolerated.  Will have patient complete two sessions of physical therapy today, and if meeting goals, will plan for discharge this afternoon.   Potassium up to 3.6 this am from 3.3 yesterday.   Will need to reschedule her initial outpatient PT appointment with [facility] from today to Monday 07/25/20.  Follow up in clinic with Dr. 07/27/20 in two weeks.   The PDMP database was reviewed today prior to any opioid medications being prescribed to this patient.  Lequita Halt, MBA, PA-C Orthopedic Surgery 07/20/2020, 7:54 AM

## 2020-07-25 ENCOUNTER — Other Ambulatory Visit (HOSPITAL_COMMUNITY): Payer: Self-pay | Admitting: Physician Assistant

## 2020-07-25 ENCOUNTER — Ambulatory Visit (HOSPITAL_COMMUNITY)
Admission: RE | Admit: 2020-07-25 | Discharge: 2020-07-25 | Disposition: A | Discharge status: Home / Self Care | Payer: Medicare HMO | Source: Ambulatory Visit | Attending: Internal Medicine | Admitting: Internal Medicine

## 2020-07-25 ENCOUNTER — Other Ambulatory Visit: Payer: Self-pay

## 2020-07-25 DIAGNOSIS — M79662 Pain in left lower leg: Secondary | ICD-10-CM | POA: Diagnosis present

## 2020-07-25 DIAGNOSIS — M79605 Pain in left leg: Secondary | ICD-10-CM

## 2020-07-27 NOTE — Discharge Summary (Signed)
Physician Discharge Summary   Patient ID: Connie Branch MRN: [id number] DOB/AGE: [birth year] 70 y.o.  Admit date: 07/18/2020 Discharge date: 07/20/2020  Primary Diagnosis: Osteoarthritis of Left Knee; S/P Left TKA  Admission Diagnoses:  Past Medical History:  Diagnosis Date  . Anxiety    panic attacks  . ARF (acute renal failure) secondary to dehydration from osmotic diuresis 05/21/2013   KIDNEY FUNCTION NORMAL NOW  . Arthritis   . Cataracts, bilateral   . DDD (degenerative disc disease)   . 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 GERD (gastroesophageal reflux disease)   . Headache    SINUS  . High cholesterol   . History of claustrophobia   . Hypertension   . Neuropathy 2015   feet  . Obesity   . Osteopenia    Discharge Diagnoses:   Principal Problem:   Primary osteoarthritis of left knee  Estimated body mass index is 27.78 kg/m as calculated from the following:   Height as of this encounter: _0  (1.6 m).   Weight as of this encounter: 71.1 kg.  Procedure:  Procedure(s) (LRB): TOTAL KNEE ARTHROPLASTY (Left)   Consults: None  HPI: Connie Branch is a 70 y.o. year old female with end stage OA of her left knee with progressively worsening pain and dysfunction. She has constant pain, with activity and at rest and significant functional deficits with difficulties even with ADLs. She has had extensive non-op management including analgesics, injections of cortisone and viscosupplements, and home exercise program, but remains in significant pain with significant dysfunction. Radiographs show bone on bone arthritis medial and patellofemoral. She presents now for left Total Knee Arthroplasty.    Laboratory Data: Admission on 07/18/2020, Discharged on 07/20/2020  Component Date Value Ref Range Status  . Glucose-Capillary 07/18/2020 103* 70 - 99 mg/dL Final   Glucose reference range applies only to samples taken after fasting for at least 8 hours.  .  Glucose-Capillary 07/18/2020 87  70 - 99 mg/dL Final   Glucose reference range applies only to samples taken after fasting for at least 8 hours.  . Glucose-Capillary 07/18/2020 217* 70 - 99 mg/dL Final   Glucose reference range applies only to samples taken after fasting for at least 8 hours.  . WBC 07/19/2020 13.1* 4.0 - 10.5 K/uL Final  . RBC 07/19/2020 3.30* 3.87 - 5.11 MIL/uL Final  . Hemoglobin 07/19/2020 9.8* 12.0 - 15.0 g/dL Final  . HCT 07/19/2020 30.0* 36.0 - 46.0 % Final  . MCV 07/19/2020 90.9  80.0 - 100.0 fL Final  . MCH 07/19/2020 29.7  26.0 - 34.0 pg Final  . MCHC 07/19/2020 32.7  30.0 - 36.0 g/dL Final  . RDW 07/19/2020 14.1  11.5 - 15.5 % Final  . Platelets 07/19/2020 108* 150 - 400 K/uL Final   Comment: SPECIMEN CHECKED FOR CLOTS Immature Platelet Fraction may be clinically indicated, consider ordering this additional test [id number] PLATELET COUNT CONFIRMED BY SMEAR   . nRBC 07/19/2020 0.0  0.0 - 0.2 % Final   Performed at [facility] [address], [place], [place] [address]  . Sodium 07/19/2020 136  135 - 145 mmol/L Final  . Potassium 07/19/2020 3.3* 3.5 - 5.1 mmol/L Final  . Chloride 07/19/2020 104  98 - 111 mmol/L Final  . CO2 07/19/2020 24  22 - 32 mmol/L Final  . Glucose, Bld 07/19/2020 166* 70 - 99 mg/dL Final   Glucose reference range applies only to samples taken after fasting for at  least 8 hours.  . BUN 07/19/2020 19  8 - 23 mg/dL Final  . Creatinine, Ser 07/19/2020 0.79  0.44 - 1.00 mg/dL Final  . Calcium 07/19/2020 8.0* 8.9 - 10.3 mg/dL Final  . GFR, Estimated 07/19/2020 >60  >60 mL/min Final   Comment: (NOTE) Calculated using the CKD-EPI Creatinine Equation (2021)   . Anion gap 07/19/2020 8  5 - 15 Final   Performed at [facility], [place] [address], [place], [place] [address]  . Glucose-Capillary 07/18/2020 172* 70 - 99 mg/dL Final   Glucose reference range applies only to samples taken after fasting for at  least 8 hours.  . Glucose-Capillary 07/19/2020 101* 70 - 99 mg/dL Final   Glucose reference range applies only to samples taken after fasting for at least 8 hours.  . Glucose-Capillary 07/19/2020 149* 70 - 99 mg/dL Final   Glucose reference range applies only to samples taken after fasting for at least 8 hours.  . WBC 07/20/2020 12.0* 4.0 - 10.5 K/uL Final  . RBC 07/20/2020 3.19* 3.87 - 5.11 MIL/uL Final  . Hemoglobin 07/20/2020 9.5* 12.0 - 15.0 g/dL Final  . HCT 07/20/2020 28.9* 36.0 - 46.0 % Final  . MCV 07/20/2020 90.6  80.0 - 100.0 fL Final  . MCH 07/20/2020 29.8  26.0 - 34.0 pg Final  . MCHC 07/20/2020 32.9  30.0 - 36.0 g/dL Final  . RDW 07/20/2020 14.5  11.5 - 15.5 % Final  . Platelets 07/20/2020 105* 150 - 400 K/uL Final   Comment: SPECIMEN CHECKED FOR CLOTS Immature Platelet Fraction may be clinically indicated, consider ordering this additional test [id number] CONSISTENT WITH PREVIOUS RESULT   . nRBC 07/20/2020 0.0  0.0 - 0.2 % Final   Performed at [facility] [address] [place], [place] [address]  . Sodium 07/20/2020 135  135 - 145 mmol/L Final  . Potassium 07/20/2020 3.6  3.5 - 5.1 mmol/L Final  . Chloride 07/20/2020 100  98 - 111 mmol/L Final  . CO2 07/20/2020 25  22 - 32 mmol/L Final  . Glucose, Bld 07/20/2020 114* 70 - 99 mg/dL Final   Glucose reference range applies only to samples taken after fasting for at least 8 hours.  . BUN 07/20/2020 16  8 - 23 mg/dL Final  . Creatinine, Ser 07/20/2020 0.99  0.44 - 1.00 mg/dL Final  . Calcium 07/20/2020 8.5* 8.9 - 10.3 mg/dL Final  . GFR, Estimated 07/20/2020 >60  >60 mL/min Final   Comment: (NOTE) Calculated using the CKD-EPI Creatinine Equation (2021)   . Anion gap 07/20/2020 10  5 - 15 Final   Performed at [facility], [place] [address] [place], [place] [address]  . Glucose-Capillary 07/19/2020 156* 70 - 99 mg/dL Final   Glucose reference range applies only to samples taken  after fasting for at least 8 hours.  . Glucose-Capillary 07/19/2020 119* 70 - 99 mg/dL Final   Glucose reference range applies only to samples taken after fasting for at least 8 hours.  . Glucose-Capillary 07/20/2020 112* 70 - 99 mg/dL Final   Glucose reference range applies only to samples taken after fasting for at least 8 hours.  . Glucose-Capillary 07/20/2020 125* 70 - 99 mg/dL Final   Glucose reference range applies only to samples taken after fasting for at least 8 hours.  Hospital Outpatient Visit on 07/14/2020  Component Date Value Ref Range Status  . SARS Coronavirus 2 07/14/2020 NEGATIVE  NEGATIVE Final   Comment: (NOTE)  SARS-CoV-2 target nucleic acids are NOT DETECTED.  The SARS-CoV-2 RNA is generally detectable in upper and lower respiratory specimens during the acute phase of infection. Negative results do not preclude SARS-CoV-2 infection, do not rule out co-infections with other pathogens, and should not be used as the sole basis for treatment or other patient management decisions. Negative results must be combined with clinical observations, patient history, and epidemiological information. The expected result is Negative.  Fact Sheet for Patients: SugarRoll.be  Fact Sheet for Healthcare Providers: https://www.woods-mathews.com/  This test is not yet approved or cleared by the [place] FDA and  has been authorized for detection and/or diagnosis of SARS-CoV-2 by FDA under an Emergency Use Authorization (EUA). This EUA will remain  in effect (meaning this test can be used) for the duration of the COVID-19 declaration under Se                          ction 564(b)(1) of the Act, 21 U.S.C. section 360bbb-3(b)(1), unless the authorization is terminated or revoked sooner.  Performed at [facility], [place] [address], [place], [place] [address]   Hospital Outpatient Visit on 07/08/2020  Component Date Value Ref Range  Status  . WBC 07/08/2020 4.8  4.0 - 10.5 K/uL Final  . RBC 07/08/2020 4.16  3.87 - 5.11 MIL/uL Final  . Hemoglobin 07/08/2020 12.5  12.0 - 15.0 g/dL Final  . HCT 07/08/2020 37.9  36.0 - 46.0 % Final  . MCV 07/08/2020 91.1  80.0 - 100.0 fL Final  . MCH 07/08/2020 30.0  26.0 - 34.0 pg Final  . MCHC 07/08/2020 33.0  30.0 - 36.0 g/dL Final  . RDW 07/08/2020 14.1  11.5 - 15.5 % Final  . Platelets 07/08/2020 123* 150 - 400 K/uL Final  . nRBC 07/08/2020 0.0  0.0 - 0.2 % Final   Performed at [facility], [place] [address] [place], [place] [address]  . Sodium 07/08/2020 138  135 - 145 mmol/L Final  . Potassium 07/08/2020 3.5  3.5 - 5.1 mmol/L Final  . Chloride 07/08/2020 103  98 - 111 mmol/L Final  . CO2 07/08/2020 28  22 - 32 mmol/L Final  . Glucose, Bld 07/08/2020 91  70 - 99 mg/dL Final   Glucose reference range applies only to samples taken after fasting for at least 8 hours.  . BUN 07/08/2020 27* 8 - 23 mg/dL Final  . Creatinine, Ser 07/08/2020 0.78  0.44 - 1.00 mg/dL Final  . Calcium 07/08/2020 9.4  8.9 - 10.3 mg/dL Final  . Total Protein 07/08/2020 7.6  6.5 - 8.1 g/dL Final  . Albumin 07/08/2020 4.5  3.5 - 5.0 g/dL Final  . AST 07/08/2020 17  15 - 41 U/L Final  . ALT 07/08/2020 14  0 - 44 U/L Final  . Alkaline Phosphatase 07/08/2020 61  38 - 126 U/L Final  . Total Bilirubin 07/08/2020 0.6  0.3 - 1.2 mg/dL Final  . GFR, Estimated 07/08/2020 >60  >60 mL/min Final   Comment: (NOTE) Calculated using the CKD-EPI Creatinine Equation (2021)   . Anion gap 07/08/2020 7  5 - 15 Final   Performed at [facility], [place] [address], [place], [place] [address]  . Prothrombin Time 07/08/2020 11.4  11.4 - 15.2 seconds Final  . INR 07/08/2020 0.9  0.8 - 1.2 Final   Comment: (NOTE) INR goal varies based on device and disease states. Performed at [facility], [place] [address], [place], [place] [address]   . aPTT 07/08/2020 30  24 - 36 seconds  Final   Performed at [facility], [place] [address], [place], [place] [address]  . ABO/RH(D) 07/08/2020 O POS   Final  . Antibody Screen 07/08/2020 NEG   Final  . Sample Expiration 07/08/2020 07/21/2020,2359   Final  . Extend sample reason 07/08/2020    Final                   Value:NO TRANSFUSIONS OR PREGNANCY IN THE PAST 3 MONTHS Performed at [facility], [place] [address] [place], [place] [address]   . MRSA, PCR 07/08/2020 NEGATIVE  NEGATIVE Final  . Staphylococcus aureus 07/08/2020 NEGATIVE  NEGATIVE Final   Comment: (NOTE) The Xpert SA Assay (FDA approved for NASAL specimens in patients 54 years of age and older), is one component of a comprehensive surveillance program. It is not intended to diagnose infection nor to guide or monitor treatment. Performed at [facility], [place] [address], [place], [place] [address]   . Glucose-Capillary 07/08/2020 90  70 - 99 mg/dL Final   Glucose reference range applies only to samples taken after fasting for at least 8 hours.  Hospital Outpatient Visit on 06/02/2020  Component Date Value Ref Range Status  . SARS Coronavirus 2 06/02/2020 NEGATIVE  NEGATIVE Final   Comment: (NOTE) SARS-CoV-2 target nucleic acids are NOT DETECTED.  The SARS-CoV-2 RNA is generally detectable in upper and lower respiratory specimens during the acute phase of infection. Negative results do not preclude SARS-CoV-2 infection, do not rule out co-infections with other pathogens, and should not be used as the sole basis for treatment or other patient management decisions. Negative results must be combined with clinical observations, patient history, and epidemiological information. The expected result is Negative.  Fact Sheet for Patients: SugarRoll.be  Fact Sheet for Healthcare Providers: https://www.woods-mathews.com/  This test is not yet approved or cleared by the  [place] FDA and  has been authorized for detection and/or diagnosis of SARS-CoV-2 by FDA under an Emergency Use Authorization (EUA). This EUA will remain  in effect (meaning this test can be used) for the duration of the COVID-19 declaration under Se                          ction 564(b)(1) of the Act, 21 U.S.C. section 360bbb-3(b)(1), unless the authorization is terminated or revoked sooner.  Performed at [facility], [place] [address], [place], [place] [address]   Hospital Outpatient Visit on 05/26/2020  Component Date Value Ref Range Status  . Hgb A1c MFr Bld 05/26/2020 4.9  4.8 - 5.6 % Final   Comment: (NOTE) Pre diabetes:          5.7%-6.4%  Diabetes:              >6.4%  Glycemic control for   <7.0% adults with diabetes   . Mean Plasma Glucose 05/26/2020 93.93  mg/dL Final   Performed at [facility] [address], [place], [place] [address]  . WBC 05/26/2020 5.5  4.0 - 10.5 K/uL Final  . RBC 05/26/2020 3.98  3.87 - 5.11 MIL/uL Final  . Hemoglobin 05/26/2020 11.9* 12.0 - 15.0 g/dL Final  . HCT 05/26/2020 36.2  36.0 - 46.0 % Final  . MCV 05/26/2020 91.0  80.0 - 100.0 fL Final  . [facility] 05/26/2020 29.9  26.0 - 34.0 pg Final  . MCHC 05/26/2020 32.9  30.0 - 36.0 g/dL Final  . RDW 05/26/2020 14.5  11.5 - 15.5 % Final  . Platelets 05/26/2020 130* 150 - 400 K/uL Final  . nRBC 05/26/2020 0.0  0.0 - 0.2 % Final   Performed at [facility], [place] [address] [place], [place] [address]  . Sodium 05/26/2020 137  135 - 145 mmol/L Final  . Potassium 05/26/2020 3.6  3.5 - 5.1 mmol/L Final  . Chloride 05/26/2020 100  98 - 111 mmol/L Final  . CO2 05/26/2020 27  22 - 32 mmol/L Final  . Glucose, Bld 05/26/2020 97  70 - 99 mg/dL Final   Glucose reference range applies only to samples taken after fasting for at least 8 hours.  . BUN 05/26/2020 22  8 - 23 mg/dL Final  . Creatinine, Ser 05/26/2020 0.82  0.44 - 1.00 mg/dL Final  . Calcium 05/26/2020 9.4  8.9 -  10.3 mg/dL Final  . Total Protein 05/26/2020 7.4  6.5 - 8.1 g/dL Final  . Albumin 05/26/2020 4.6  3.5 - 5.0 g/dL Final  . AST 05/26/2020 16  15 - 41 U/L Final  . ALT 05/26/2020 14  0 - 44 U/L Final  . Alkaline Phosphatase 05/26/2020 61  38 - 126 U/L Final  . Total Bilirubin 05/26/2020 0.7  0.3 - 1.2 mg/dL Final  . GFR, Estimated 05/26/2020 >60  >60 mL/min Final   Comment: (NOTE) Calculated using the CKD-EPI Creatinine Equation (2021)   . Anion gap 05/26/2020 10  5 - 15 Final   Performed at [facility], [place] [address], [place], [place] [address]  . Prothrombin Time 05/26/2020 11.9  11.4 - 15.2 seconds Final  . INR 05/26/2020 0.9  0.8 - 1.2 Final   Comment: (NOTE) INR goal varies based on device and disease states. Performed at [facility], [place] [address], [place], [place] [address]   . aPTT 05/26/2020 29  24 - 36 seconds Final   Performed at [facility], [place] [address], [place], [place] [address]  . ABO/RH(D) 05/26/2020 O POS   Final  . Antibody Screen 05/26/2020 NEG   Final  . Sample Expiration 05/26/2020 06/09/2020,2359   Final  . Extend sample reason 05/26/2020    Final                   Value:NO TRANSFUSIONS OR PREGNANCY IN THE PAST 3 MONTHS Performed at [facility] [address], [place], [place] [address]   . MRSA, PCR 05/26/2020 NEGATIVE  NEGATIVE Final  . Staphylococcus aureus 05/26/2020 NEGATIVE  NEGATIVE Final   Comment: (NOTE) The Xpert SA Assay (FDA approved for NASAL specimens in patients 83 years of age and older), is one component of a comprehensive surveillance program. It is not intended to diagnose infection nor to guide or monitor treatment. Performed at [facility], [place] [address], [place], [place] [address]   . Glucose-Capillary 05/26/2020 90  70 - 99 mg/dL Final   Glucose reference range applies only to samples taken after fasting for at least 8 hours.      X-Rays:VAS [place] LOWER EXTREMITY VENOUS (DVT)  Result Date: 07/26/2020  Lower Venous DVT Study Indications: Patient presents with left lower extremity pain and swelling s/p knee replacement surgery 1 week ago.  Risk Factors: Surgery s/p left total knee arthroplasty on 07/18/20. Anticoagulation: Xarelto. Limitations: Poor ultrasound/tissue interface. Performing Technologist: Andee Poles  Schmitt RVT  Examination Guidelines: A complete evaluation includes B-mode imaging, spectral Doppler, color Doppler, and power Doppler as needed of all accessible portions of each vessel. Bilateral testing is considered an integral part of a complete examination. Limited examinations for reoccurring indications may be performed as noted. The reflux portion of the exam is performed with the patient in reverse Trendelenburg.  +-----+---------------+---------+-----------+----------+--------------+ RIGHTCompressibilityPhasicitySpontaneityPropertiesThrombus Aging +-----+---------------+---------+-----------+----------+--------------+ CFV  Full           Yes      Yes                                 +-----+---------------+---------+-----------+----------+--------------+   +---------+---------------+---------+-----------+----------+-------------------+ LEFT     CompressibilityPhasicitySpontaneityPropertiesThrombus Aging      +---------+---------------+---------+-----------+----------+-------------------+ CFV      Full           Yes      Yes                                      +---------+---------------+---------+-----------+----------+-------------------+ SFJ      Full           Yes      Yes                                      +---------+---------------+---------+-----------+----------+-------------------+ FV Prox  Full           Yes      Yes                                      +---------+---------------+---------+-----------+----------+-------------------+ FV Mid   Full           Yes      Yes                                       +---------+---------------+---------+-----------+----------+-------------------+ FV DistalFull           Yes      Yes                                      +---------+---------------+---------+-----------+----------+-------------------+ PFV      Full                                                             +---------+---------------+---------+-----------+----------+-------------------+ POP      Full           Yes      Yes                                      +---------+---------------+---------+-----------+----------+-------------------+ PTV      Full           Yes      Yes                                      +---------+---------------+---------+-----------+----------+-------------------+  PERO                                                  Not well visualized +---------+---------------+---------+-----------+----------+-------------------+ Gastroc  Full                                                             +---------+---------------+---------+-----------+----------+-------------------+ GSV      Full           Yes      Yes                                      +---------+---------------+---------+-----------+----------+-------------------+ Edema noted in the left calf. Peroneal veins not able to be visualized.   Findings reported to Tammy at [facility] at 2:30 pm.  Summary: RIGHT: - No evidence of common femoral vein obstruction.  LEFT: - No evidence of deep vein thrombosis in the lower extremity. No indirect evidence of obstruction proximal to the inguinal ligament. - No cystic structure found in the popliteal fossa.  *See table(s) above for measurements and observations. Electronically signed by Kathlyn Sacramento MD on 07/26/2020 at 5:02:37 PM.    Final     EKG: Orders placed or performed during the hospital encounter of 05/26/20  . EKG 12-Lead  . EKG 12-Lead     Hospital Course: Connie Branch is a 70 y.o. who  was admitted to [facility]. They were brought to the operating room on 07/18/2020 and underwent Procedure(s): TOTAL KNEE ARTHROPLASTY.  Patient tolerated the procedure well and was later transferred to the recovery room and then to the orthopaedic floor for postoperative care. They were given PO and IV analgesics for pain control following their surgery. They were given 24 hours of postoperative antibiotics of  Anti-infectives (From admission, onward)   Start     Dose/Rate Route Frequency Ordered Stop   07/18/20 1400  ceFAZolin (ANCEF) IVPB 2g/100 mL premix        2 g 200 mL/hr over 30 Minutes Intravenous Every 6 hours 07/18/20 1214 07/18/20 2040   07/18/20 0630  ceFAZolin (ANCEF) IVPB 2g/100 mL premix        2 g 200 mL/hr over 30 Minutes Intravenous On call to O.R. 07/18/20 7782 07/18/20 0846     and started on DVT prophylaxis in the form of Xarelto and TED hose.   PT and OT were ordered for total joint protocol. Discharge planning consulted to help with postop disposition and equipment needs. Patient had an uneventful night on the evening of surgery. They started to get up OOB with therapy on 07/18/20. Continued to work with therapy into POD #2. Pt was seen during rounds on day two and was ready to go home pending progress with therapy. Pt worked with therapy for four additional sessions and was meeting their goals. She was discharged to home later that day in stable condition.  Diet: Regular diet Activity: WBAT Follow-up: in two weeks Disposition: Home Discharged Condition: good   Discharge Instructions    Call MD / Call 911   Complete by: As directed  If you experience chest pain or shortness of breath, CALL 911 and be transported to the hospital emergency room.  If you develope a fever above 101 F, pus (white drainage) or increased drainage or redness at the wound, or calf pain, call your surgeon's office.   Change dressing   Complete by: As directed    You may remove the  bulky bandage (ACE wrap and gauze) two days after surgery. You will have an adhesive waterproof bandage underneath. Leave this in place until your first follow-up appointment.   Constipation Prevention   Complete by: As directed    Drink plenty of fluids.  Prune juice may be helpful.  You may use a stool softener, such as Colace (over the counter) 100 mg twice a day.  Use MiraLax (over the counter) for constipation as needed.   Diet - low sodium heart healthy   Complete by: As directed    Do not put a pillow under the knee. Place it under the heel.   Complete by: As directed    Driving restrictions   Complete by: As directed    No driving for two weeks   TED hose   Complete by: As directed    Use stockings (TED hose) for three weeks on both leg(s).  You may remove them at night for sleeping.   Weight bearing as tolerated   Complete by: As directed      Allergies as of 07/20/2020      Reactions   Erythromycin Anaphylaxis   Aspirin Nausea And Vomiting   Penicillins Other (See Comments)   Childhood allergy Has patient had a PCN reaction causing immediate rash, facial/tongue/throat swelling, SOB or lightheadedness with hypotension: Unknown Has patient had a PCN reaction causing severe rash involving mucus membranes or skin necrosis: Unknown Has patient had a PCN reaction that required hospitalization:Unknown Has patient had a PCN reaction occurring within the last 10 years:No Tolerated Cephalosporin Date: 07/19/20.      Medication List    STOP taking these medications   vitamin C 1000 MG tablet     TAKE these medications   acetaminophen 500 MG tablet Commonly known as: TYLENOL Take 1,000 mg by mouth every 6 (six) hours as needed (for pain.).   blood glucose meter kit and supplies Use as instructed   fluticasone 50 MCG/ACT nasal spray Commonly known as: FLONASE Place 1-2 sprays into both nostrils daily as needed for allergies.   gabapentin 100 MG capsule Commonly known as:  NEURONTIN Take a 100 mg capsule three times a day for two weeks following surgery.Then take a 100 mg capsule two times a day for two weeks. Then take a 100 mg capsule once a day for two weeks. Then discontinue. What changed:   how much to take  how to take this  when to take this  additional instructions   hydrochlorothiazide 25 MG tablet Commonly known as: HYDRODIURIL Take 25 mg by mouth daily.   HYDROcodone-acetaminophen 5-325 MG tablet Commonly known as: NORCO/VICODIN Take 1-2 tablets by mouth every 6 (six) hours as needed for severe pain.   losartan 100 MG tablet Commonly known as: COZAAR Take 100 mg by mouth daily.   MAALOX MAX PO Take 5-10 mLs by mouth 3 (three) times daily as needed (for acid reflux/heartburn.).   metFORMIN 500 MG tablet Commonly known as: GLUCOPHAGE Take 500 mg by mouth daily after supper.   methocarbamol 500 MG tablet Commonly known as: ROBAXIN Take 1 tablet (500 mg total) by  mouth every 6 (six) hours as needed for muscle spasms.   omeprazole 40 MG capsule Commonly known as: PRILOSEC Take 40 mg by mouth daily.   rivaroxaban 10 MG Tabs tablet Commonly known as: XARELTO Take 1 tablet (10 mg total) by mouth daily with breakfast for 20 days.   simvastatin 40 MG tablet Commonly known as: ZOCOR Take 40 mg by mouth daily.   traMADol 50 MG tablet Commonly known as: ULTRAM Take 1-2 tablets (50-100 mg total) by mouth every 6 (six) hours as needed for moderate pain.            Discharge Care Instructions  (From admission, onward)         Start     Ordered   07/19/20 0000  Weight bearing as tolerated        07/19/20 0746   07/19/20 0000  Change dressing       Comments: You may remove the bulky bandage (ACE wrap and gauze) two days after surgery. You will have an adhesive waterproof bandage underneath. Leave this in place until your first follow-up appointment.   07/19/20 0746          Follow-up Information    Gaynelle Arabian, MD. Go  on 08/02/2020.   Specialty: Orthopedic Surgery Why: You are scheduled for first post op appointment on Tuesday March 29th at 2:30pm. Contact information: [address] [place] [address] [phone]        Rosilyn Mings.. Go on 07/21/2020.   Why: You are scheduled for physical therapy eval on Thursday March 17th at 11:00am. Contact information: [address] [place] [address] [phone]               Signed: Fenton Foy, MBA, PA-C Orthopedic Surgery 07/27/2020, 7:33 AM

## 2020-08-14 ENCOUNTER — Encounter (HOSPITAL_COMMUNITY): Payer: Self-pay

## 2020-08-14 ENCOUNTER — Observation Stay (HOSPITAL_COMMUNITY)
Admission: EM | Admit: 2020-08-14 | Discharge: 2020-08-16 | Disposition: A | Discharge status: Home / Self Care | Payer: Medicare HMO | Attending: Student | Admitting: Student

## 2020-08-14 ENCOUNTER — Emergency Department (HOSPITAL_COMMUNITY): Payer: Medicare HMO

## 2020-08-14 DIAGNOSIS — K5792 Diverticulitis of intestine, part unspecified, without perforation or abscess without bleeding: Secondary | ICD-10-CM | POA: Diagnosis present

## 2020-08-14 DIAGNOSIS — Z87891 Personal history of nicotine dependence: Secondary | ICD-10-CM | POA: Diagnosis not present

## 2020-08-14 DIAGNOSIS — I10 Essential (primary) hypertension: Secondary | ICD-10-CM | POA: Diagnosis present

## 2020-08-14 DIAGNOSIS — K5900 Constipation, unspecified: Secondary | ICD-10-CM

## 2020-08-14 DIAGNOSIS — Z20822 Contact with and (suspected) exposure to covid-19: Secondary | ICD-10-CM | POA: Diagnosis not present

## 2020-08-14 DIAGNOSIS — Z96653 Presence of artificial knee joint, bilateral: Secondary | ICD-10-CM | POA: Insufficient documentation

## 2020-08-14 DIAGNOSIS — K5732 Diverticulitis of large intestine without perforation or abscess without bleeding: Secondary | ICD-10-CM | POA: Diagnosis not present

## 2020-08-14 DIAGNOSIS — Z7901 Long term (current) use of anticoagulants: Secondary | ICD-10-CM | POA: Insufficient documentation

## 2020-08-14 DIAGNOSIS — D649 Anemia, unspecified: Secondary | ICD-10-CM

## 2020-08-14 DIAGNOSIS — Z79899 Other long term (current) drug therapy: Secondary | ICD-10-CM | POA: Insufficient documentation

## 2020-08-14 DIAGNOSIS — E119 Type 2 diabetes mellitus without complications: Secondary | ICD-10-CM | POA: Diagnosis not present

## 2020-08-14 DIAGNOSIS — Z7984 Long term (current) use of oral hypoglycemic drugs: Secondary | ICD-10-CM | POA: Insufficient documentation

## 2020-08-14 DIAGNOSIS — E876 Hypokalemia: Secondary | ICD-10-CM

## 2020-08-14 DIAGNOSIS — E1169 Type 2 diabetes mellitus with other specified complication: Secondary | ICD-10-CM

## 2020-08-14 DIAGNOSIS — R109 Unspecified abdominal pain: Secondary | ICD-10-CM | POA: Diagnosis present

## 2020-08-14 DIAGNOSIS — E785 Hyperlipidemia, unspecified: Secondary | ICD-10-CM | POA: Diagnosis present

## 2020-08-14 LAB — URINALYSIS, ROUTINE W REFLEX MICROSCOPIC
Bacteria, UA: NONE SEEN
Bilirubin Urine: NEGATIVE
Glucose, UA: NEGATIVE mg/dL
Hgb urine dipstick: NEGATIVE
Ketones, ur: 5 mg/dL — AB
Leukocytes,Ua: NEGATIVE
Nitrite: NEGATIVE
Protein, ur: 30 mg/dL — AB
Specific Gravity, Urine: 1.023 (ref 1.005–1.030)
pH: 8 (ref 5.0–8.0)

## 2020-08-14 LAB — COMPREHENSIVE METABOLIC PANEL
ALT: 10 U/L (ref 0–44)
AST: 10 U/L — ABNORMAL LOW (ref 15–41)
Albumin: 4 g/dL (ref 3.5–5.0)
Alkaline Phosphatase: 71 U/L (ref 38–126)
Anion gap: 12 (ref 5–15)
BUN: 14 mg/dL (ref 8–23)
CO2: 28 mmol/L (ref 22–32)
Calcium: 8.2 mg/dL — ABNORMAL LOW (ref 8.9–10.3)
Chloride: 100 mmol/L (ref 98–111)
Creatinine, Ser: 0.89 mg/dL (ref 0.44–1.00)
GFR, Estimated: >60 mL/min (ref >60)
Glucose, Bld: 100 mg/dL — ABNORMAL HIGH (ref 70–99)
Potassium: 2.2 mmol/L — CL (ref 3.5–5.1)
Sodium: 140 mmol/L (ref 135–145)
Total Bilirubin: 0.7 mg/dL (ref 0.3–1.2)
Total Protein: 7.4 g/dL (ref 6.5–8.1)

## 2020-08-14 LAB — CBC WITH DIFFERENTIAL/PLATELET
Abs Immature Granulocytes: 0.03 10*3/uL (ref 0.00–0.07)
Basophils Absolute: 0 10*3/uL (ref 0.0–0.1)
Basophils Relative: 1 %
Eosinophils Absolute: 0 10*3/uL (ref 0.0–0.5)
Eosinophils Relative: 0 %
HCT: 27.5 % — ABNORMAL LOW (ref 36.0–46.0)
Hemoglobin: 8.9 g/dL — ABNORMAL LOW (ref 12.0–15.0)
Immature Granulocytes: 0 %
Lymphocytes Relative: 30 %
Lymphs Abs: 2.1 10*3/uL (ref 0.7–4.0)
MCH: 29.6 pg (ref 26.0–34.0)
MCHC: 32.4 g/dL (ref 30.0–36.0)
MCV: 91.4 fL (ref 80.0–100.0)
Monocytes Absolute: 0.7 10*3/uL (ref 0.1–1.0)
Monocytes Relative: 10 %
Neutro Abs: 4.1 10*3/uL (ref 1.7–7.7)
Neutrophils Relative %: 59 %
Platelets: 194 10*3/uL (ref 150–400)
RBC: 3.01 MIL/uL — ABNORMAL LOW (ref 3.87–5.11)
RDW: 15.4 % (ref 11.5–15.5)
WBC: 6.9 10*3/uL (ref 4.0–10.5)
nRBC: 0 % (ref 0.0–0.2)

## 2020-08-14 LAB — LIPASE, BLOOD: Lipase: 28 U/L (ref 11–51)

## 2020-08-14 LAB — MAGNESIUM: Magnesium: 1 mg/dL — ABNORMAL LOW (ref 1.7–2.4)

## 2020-08-14 MED ORDER — IOHEXOL 300 MG/ML  SOLN
100.0000 mL | Freq: Once | INTRAMUSCULAR | Status: AC | PRN
  Administered 2020-08-14: 100 mL via INTRAVENOUS

## 2020-08-14 MED ORDER — MORPHINE SULFATE (PF) 4 MG/ML IV SOLN
4.0000 mg | Freq: Once | INTRAVENOUS | Status: AC
  Administered 2020-08-14: 4 mg via INTRAVENOUS
  Filled 2020-08-14: qty 1

## 2020-08-14 MED ORDER — POTASSIUM CHLORIDE 10 MEQ/100ML IV SOLN
10.0000 meq | Freq: Once | INTRAVENOUS | Status: AC
  Administered 2020-08-14: 10 meq via INTRAVENOUS
  Filled 2020-08-14: qty 100

## 2020-08-14 MED ORDER — ONDANSETRON 4 MG PO TBDP
4.0000 mg | ORAL_TABLET | Freq: Once | ORAL | Status: AC
  Administered 2020-08-14: 4 mg via ORAL
  Filled 2020-08-14: qty 1

## 2020-08-14 MED ORDER — CIPROFLOXACIN IN D5W 400 MG/200ML IV SOLN
400.0000 mg | Freq: Once | INTRAVENOUS | Status: AC
  Administered 2020-08-15: 400 mg via INTRAVENOUS
  Filled 2020-08-14: qty 200

## 2020-08-14 MED ORDER — POTASSIUM CHLORIDE CRYS ER 20 MEQ PO TBCR
60.0000 meq | EXTENDED_RELEASE_TABLET | Freq: Once | ORAL | Status: AC
  Administered 2020-08-14: 60 meq via ORAL
  Filled 2020-08-14: qty 3

## 2020-08-14 MED ORDER — LORAZEPAM 2 MG/ML IJ SOLN
1.0000 mg | Freq: Once | INTRAMUSCULAR | Status: DC
  Filled 2020-08-14 (×2): qty 1

## 2020-08-14 MED ORDER — MAGNESIUM SULFATE 2 GM/50ML IV SOLN
2.0000 g | Freq: Once | INTRAVENOUS | Status: AC
  Administered 2020-08-14: 2 g via INTRAVENOUS
  Filled 2020-08-14: qty 50

## 2020-08-14 MED ORDER — METRONIDAZOLE IN NACL 5-0.79 MG/ML-% IV SOLN
500.0000 mg | Freq: Once | INTRAVENOUS | Status: AC
  Administered 2020-08-15: 500 mg via INTRAVENOUS
  Filled 2020-08-14: qty 100

## 2020-08-14 NOTE — ED Notes (Signed)
Patient ambulated to restroom with walker.

## 2020-08-14 NOTE — ED Notes (Signed)
Spoke with [facility] in lab, the magnesium level can be processed as an add on to the blood already in lab.

## 2020-08-14 NOTE — ED Notes (Signed)
Attempted to insert PIV, unsuccessful.  Will attempt again.

## 2020-08-14 NOTE — ED Notes (Signed)
BLADDER SCAN: 57mL found.

## 2020-08-14 NOTE — ED Triage Notes (Signed)
Pt arrived via walk in, c/o diffuse abd pain since Thursday, states she was seen at urgent care and told she had blood in her urine. States she has been unable to move bowels.

## 2020-08-14 NOTE — ED Triage Notes (Signed)
Emergency Medicine Provider Triage Evaluation Note  Connie Branch , a 70 y.o. female  was evaluated in triage.  Pt complains of abdominal pain. She presents the emergency department complaining of lower abdominal pain at that started on Thursday. She has associated constipation. Pain is severe nature. She went to urgent care and had evaluation with labs but these are not available. Her follow-up appointment is scheduled for next Thursday. She has nausea. She was recently on Xarelto, but this is been discontinued.  Review of Systems  Positive: abdominal pain, constipation, nausea Negative: fevers  Physical Exam  BP (!) 147/96 (BP Location: Right Arm)   Pulse 97   Temp 98.5 F (36.9 C)   Resp 20   SpO2 100%  Gen:   Awake, no distress appears uncomfortable HEENT:  Atraumatic  Resp:  Normal effort  Cardiac:  Normal rate  Abd:   Nondistended, moderate suprapubic and left lower quadrant tenderness MSK:   Moves extremities without difficulty  Neuro:  Speech clear   Medical Decision Making  Medically screening exam initiated at 3:07 PM.  Appropriate orders placed.  Connie Branch was informed that the remainder of the evaluation will be completed by another provider, this initial triage assessment does not replace that evaluation, and the importance of remaining in the ED until their evaluation is complete.  Clinical Impression  acute abdominal pain   Tilden Fossa, MD 08/14/20 1510

## 2020-08-14 NOTE — ED Provider Notes (Signed)
[facility] DEPT Provider Note   CSN: [id number] Arrival date & time: 08/14/20  1431     History Chief Complaint  Patient presents with  . Abdominal Pain    Lastacia Solum is a 70 y.o. female.  70 year old female presents with suprapubic pain times several days.  Has had nausea but no vomiting.  No fever or chills.  Does note urinary urgency.  Patient endorses constipation and has tried over-the-counter medications without relief.  Went to urgent care and had labs as well as a abdominal films but did not get the results.  States the pain is persistent nothing makes it better.          Past Medical History:  Diagnosis Date  . Anxiety    panic attacks  . ARF (acute renal failure) secondary to dehydration from osmotic diuresis 05/21/2013   KIDNEY FUNCTION NORMAL NOW  . Arthritis   . Cataracts, bilateral   . DDD (degenerative disc disease)   . 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 GERD (gastroesophageal reflux disease)   . Headache    SINUS  . High cholesterol   . History of claustrophobia   . Hypertension   . Neuropathy 2015   feet  . Obesity   . Osteopenia     Patient Active Problem List   Diagnosis Date Noted  . 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 Postoperative anemia due to acute blood loss 06/24/2017  . Primary osteoarthritis of right knee 06/21/2017  . Primary osteoarthritis of left knee 06/21/2017  . DM (diabetes mellitus), secondary, uncontrolled, with ketoacidosis ([place]) 05/23/2013  . Hypokalemia 05/22/2013  . Obesity (BMI 30-39.9) 05/22/2013  . DKA (diabetic ketoacidoses) 05/21/2013  . Muscle spasm 05/21/2013  . Hyponatremia 05/21/2013  . ARF (acute renal failure) secondary to dehydration from osmotic diuresis 05/21/2013  . HTN (hypertension) 05/21/2013  . Hyperlipidemia 05/21/2013  . GERD (gastroesophageal reflux disease) 05/21/2013  . Infection due to trichomonas 05/21/2013    Past  Surgical History:  Procedure Laterality Date  . ABDOMINAL HYSTERECTOMY     COMPLETE  . BREAST SURGERY Left 04/20/2020   BX  . KNEE JOINT MANIPULATION Right   . OVARIAN CYST AND OVARY REMOVED  YRS AGO  . TOTAL KNEE ARTHROPLASTY Right 06/21/2017   Procedure: RIGHT TOTAL KNEE ARTHROPLASTY;  Surgeon: Dorna Leitz, MD;  Location: [facility] ORS;  Service: Orthopedics;  Laterality: Right;  . TOTAL KNEE ARTHROPLASTY Left 07/18/2020   Procedure: TOTAL KNEE ARTHROPLASTY;  Surgeon: Gaynelle Arabian, MD;  Location: [facility];  Service: Orthopedics;  Laterality: Left;  71min  . TOTAL KNEE REVISION Right 07/22/2019   Procedure: Right knee polyethylene exchange;  Surgeon: Gaynelle Arabian, MD;  Location: [facility];  Service: Orthopedics;  Laterality: Right;  176min     OB History   No obstetric history on file.     Family History  Problem Relation Age of Onset  . Breast cancer Neg Hx     Social History   Tobacco Use  . Smoking status: Former Smoker    Packs/day: 0.25    Years: 50.00    Pack years: 12.50    Types: Cigarettes    Quit date: 05/07/2013    Years since quitting: 7.2  . Smokeless tobacco: Never Used  Vaping Use  . Vaping Use: Never used  Substance Use Topics  . Alcohol use: Not Currently    Comment: rare  . Drug use: No    Home Medications Prior  to Admission medications   Medication Sig Start Date End Date Taking? Authorizing Provider  acetaminophen (TYLENOL) 500 MG tablet Take 1,000 mg by mouth every 6 (six) hours as needed (for pain.).    [provider]  Blood Glucose Monitoring Suppl (BLOOD GLUCOSE METER) kit Use as instructed 05/24/13   Rama, Venetia Maxon, MD  Calcium Carbonate-Simethicone (MAALOX MAX PO) Take 5-10 mLs by mouth 3 (three) times daily as needed (for acid reflux/heartburn.).     [provider]  fluticasone (FLONASE) 50 MCG/ACT nasal spray Place 1-2 sprays into both nostrils daily as needed for allergies.    [provider]  gabapentin (NEURONTIN)  100 MG capsule Take a 100 mg capsule three times a day for two weeks following surgery.Then take a 100 mg capsule two times a day for two weeks. Then take a 100 mg capsule once a day for two weeks. Then discontinue. 07/19/20   Edmisten, Kristie L, PA  hydrochlorothiazide (HYDRODIURIL) 25 MG tablet Take 25 mg by mouth daily. 06/29/19   [provider]  HYDROcodone-acetaminophen (NORCO/VICODIN) 5-325 MG tablet Take 1-2 tablets by mouth every 6 (six) hours as needed for severe pain. 07/19/20   Edmisten, Kristie L, PA  losartan (COZAAR) 100 MG tablet Take 100 mg by mouth daily. 07/03/19   [provider]  metFORMIN (GLUCOPHAGE) 500 MG tablet Take 500 mg by mouth daily after supper.  04/26/17   [provider]  methocarbamol (ROBAXIN) 500 MG tablet Take 1 tablet (500 mg total) by mouth every 6 (six) hours as needed for muscle spasms. 07/19/20   Edmisten, Ok Anis, PA  omeprazole (PRILOSEC) 40 MG capsule Take 40 mg by mouth daily. 05/29/17   [provider]  ondansetron (ZOFRAN-ODT) 4 MG disintegrating tablet ondansetron 4 mg disintegrating tablet  PLACE 1 TABLET EVERY 8 HOURS BY TRANSLINGUAL ROUTE AS NEEDED FOR 4 DAYS.    [provider]  rivaroxaban (XARELTO) 10 MG TABS tablet Take 1 tablet (10 mg total) by mouth daily with breakfast for 20 days. 07/19/20 08/08/20  Edmisten, Ok Anis, PA  simvastatin (ZOCOR) 40 MG tablet Take 40 mg by mouth daily. 05/25/17   [provider]  sulfamethoxazole-trimethoprim (BACTRIM DS) 800-160 MG tablet Take 1 tablet by mouth 2 (two) times daily. 08/12/20   [provider]  traMADol (ULTRAM) 50 MG tablet Take 1-2 tablets (50-100 mg total) by mouth every 6 (six) hours as needed for moderate pain. 07/19/20   Edmisten, Kristie L, PA    Allergies    Erythromycin, Aspirin, and Penicillins  Review of Systems   Review of Systems  All other systems reviewed and are negative.   Physical Exam Updated Vital Signs BP (!)  153/77 (BP Location: Right Arm)   Pulse 76   Temp 98.5 F (36.9 C)   Resp 15   SpO2 100%   Physical Exam Vitals and nursing note reviewed.  Constitutional:      General: She is not in acute distress.    Appearance: Normal appearance. She is well-developed. She is not toxic-appearing.  HENT:     Head: Normocephalic and atraumatic.  Eyes:     General: Lids are normal.     Conjunctiva/sclera: Conjunctivae normal.     Pupils: Pupils are equal, round, and reactive to light.  Neck:     Thyroid: No thyroid mass.     Trachea: No tracheal deviation.  Cardiovascular:     Rate and Rhythm: Normal rate and regular rhythm.  Heart sounds: Normal heart sounds. No murmur heard. No gallop.   Pulmonary:     Effort: Pulmonary effort is normal. No respiratory distress.     Breath sounds: Normal breath sounds. No stridor. No decreased breath sounds, wheezing, rhonchi or rales.  Abdominal:     General: Bowel sounds are normal. There is no distension.     Palpations: Abdomen is soft.     Tenderness: There is no abdominal tenderness. There is no rebound.    Musculoskeletal:        General: No tenderness. Normal range of motion.     Cervical back: Normal range of motion and neck supple.  Skin:    General: Skin is warm and dry.     Findings: No abrasion or rash.  Neurological:     Mental Status: She is alert and oriented to person, place, and time.     GCS: GCS eye subscore is 4. GCS verbal subscore is 5. GCS motor subscore is 6.     Cranial Nerves: No cranial nerve deficit.     Sensory: No sensory deficit.  Psychiatric:        Speech: Speech normal.        Behavior: Behavior normal.     ED Results / Procedures / Treatments   Labs (all labs ordered are listed, but only abnormal results are displayed) Labs Reviewed  COMPREHENSIVE METABOLIC PANEL - Abnormal; Notable for the following components:      Result Value   Potassium 2.2 (*)    Glucose, Bld 100 (*)    Calcium 8.2 (*)    AST  10 (*)    All other components within normal limits  CBC WITH DIFFERENTIAL/PLATELET - Abnormal; Notable for the following components:   RBC 3.01 (*)    Hemoglobin 8.9 (*)    HCT 27.5 (*)    All other components within normal limits  URINALYSIS, ROUTINE W REFLEX MICROSCOPIC - Abnormal; Notable for the following components:   Ketones, ur 5 (*)    Protein, ur 30 (*)    All other components within normal limits  URINE CULTURE  LIPASE, BLOOD  MAGNESIUM    EKG None  Radiology No results found.  Procedures Procedures   Medications Ordered in ED Medications  potassium chloride 10 mEq in 100 mL IVPB (has no administration in time range)  potassium chloride SA (KLOR-CON) CR tablet 60 mEq (has no administration in time range)  ondansetron (ZOFRAN-ODT) disintegrating tablet 4 mg (4 mg Oral Given 08/14/20 1537)    ED Course  I have reviewed the triage vital signs and the nursing notes.  Pertinent labs & imaging results that were available during my care of the patient were reviewed by me and considered in my medical decision making (see chart for details).    MDM Rules/Calculators/A&P                          Patient electrolytes significant for hypomagnesemia as well as hypokalemia.  Patient supplemented with IV as well as oral potassium.  Also given IV magnesium.  Abdominal CT consistent with diverticulitis.  Patient started on IV antibiotics.  Will require hospitalization  CRITICAL CARE Performed by: Toy Baker Total critical care time: 50 minutes Critical care time was exclusive of separately billable procedures and treating other patients. Critical care was necessary to treat or prevent imminent or life-threatening deterioration. Critical care was time spent personally by me on the following activities: development  of treatment plan with patient and/or surrogate as well as nursing, discussions with consultants, evaluation of patient's response to treatment, examination of  patient, obtaining history from patient or surrogate, ordering and performing treatments and interventions, ordering and review of laboratory studies, ordering and review of radiographic studies, pulse oximetry and re-evaluation of patient's condition.  Final Clinical Impression(s) / ED Diagnoses Final diagnoses:  None    Rx / DC Orders ED Discharge Orders    None       Lacretia Leigh, MD 08/14/20 2253

## 2020-08-15 ENCOUNTER — Other Ambulatory Visit: Payer: Self-pay

## 2020-08-15 DIAGNOSIS — D649 Anemia, unspecified: Secondary | ICD-10-CM

## 2020-08-15 DIAGNOSIS — K5792 Diverticulitis of intestine, part unspecified, without perforation or abscess without bleeding: Secondary | ICD-10-CM | POA: Diagnosis not present

## 2020-08-15 DIAGNOSIS — E785 Hyperlipidemia, unspecified: Secondary | ICD-10-CM

## 2020-08-15 DIAGNOSIS — E1169 Type 2 diabetes mellitus with other specified complication: Secondary | ICD-10-CM

## 2020-08-15 DIAGNOSIS — E876 Hypokalemia: Secondary | ICD-10-CM | POA: Diagnosis not present

## 2020-08-15 DIAGNOSIS — I10 Essential (primary) hypertension: Secondary | ICD-10-CM

## 2020-08-15 LAB — BASIC METABOLIC PANEL
Anion gap: 10 (ref 5–15)
BUN: 10 mg/dL (ref 8–23)
CO2: 25 mmol/L (ref 22–32)
Calcium: 7.9 mg/dL — ABNORMAL LOW (ref 8.9–10.3)
Chloride: 102 mmol/L (ref 98–111)
Creatinine, Ser: 0.77 mg/dL (ref 0.44–1.00)
GFR, Estimated: >60 mL/min (ref >60)
Glucose, Bld: 111 mg/dL — ABNORMAL HIGH (ref 70–99)
Potassium: 2.9 mmol/L — ABNORMAL LOW (ref 3.5–5.1)
Sodium: 137 mmol/L (ref 135–145)

## 2020-08-15 LAB — SARS CORONAVIRUS 2 (TAT 6-24 HRS): SARS Coronavirus 2: NEGATIVE

## 2020-08-15 LAB — FOLATE: Folate: 10.4 ng/mL (ref >5.9)

## 2020-08-15 LAB — CBC
HCT: 25.8 % — ABNORMAL LOW (ref 36.0–46.0)
Hemoglobin: 8.2 g/dL — ABNORMAL LOW (ref 12.0–15.0)
MCH: 29.3 pg (ref 26.0–34.0)
MCHC: 31.8 g/dL (ref 30.0–36.0)
MCV: 92.1 fL (ref 80.0–100.0)
Platelets: 164 10*3/uL (ref 150–400)
RBC: 2.8 MIL/uL — ABNORMAL LOW (ref 3.87–5.11)
RDW: 15.4 % (ref 11.5–15.5)
WBC: 7.1 10*3/uL (ref 4.0–10.5)
nRBC: 0 % (ref 0.0–0.2)

## 2020-08-15 LAB — IRON AND TIBC
Iron: 34 ug/dL (ref 28–170)
Saturation Ratios: 14 % (ref 10.4–31.8)
TIBC: 239 ug/dL — ABNORMAL LOW (ref 250–450)
UIBC: 205 ug/dL

## 2020-08-15 LAB — FERRITIN: Ferritin: 271 ng/mL (ref 11–307)

## 2020-08-15 LAB — VITAMIN B12: Vitamin B-12: 661 pg/mL (ref 180–914)

## 2020-08-15 LAB — MAGNESIUM: Magnesium: 1.6 mg/dL — ABNORMAL LOW (ref 1.7–2.4)

## 2020-08-15 MED ORDER — CIPROFLOXACIN IN D5W 400 MG/200ML IV SOLN
400.0000 mg | Freq: Two times a day (BID) | INTRAVENOUS | Status: DC
  Administered 2020-08-15 – 2020-08-16 (×2): 400 mg via INTRAVENOUS
  Filled 2020-08-15 (×2): qty 200

## 2020-08-15 MED ORDER — METRONIDAZOLE IN NACL 5-0.79 MG/ML-% IV SOLN
500.0000 mg | Freq: Three times a day (TID) | INTRAVENOUS | Status: DC
  Administered 2020-08-15 – 2020-08-16 (×4): 500 mg via INTRAVENOUS
  Filled 2020-08-15 (×4): qty 100

## 2020-08-15 MED ORDER — ONDANSETRON HCL 4 MG/2ML IJ SOLN
4.0000 mg | Freq: Three times a day (TID) | INTRAMUSCULAR | Status: DC | PRN
  Administered 2020-08-15: 4 mg via INTRAVENOUS
  Filled 2020-08-15: qty 2

## 2020-08-15 MED ORDER — TRAMADOL HCL 50 MG PO TABS
50.0000 mg | ORAL_TABLET | Freq: Four times a day (QID) | ORAL | Status: DC | PRN
  Administered 2020-08-15: 100 mg via ORAL
  Filled 2020-08-15: qty 2

## 2020-08-15 MED ORDER — ONDANSETRON HCL 4 MG/2ML IJ SOLN
4.0000 mg | Freq: Four times a day (QID) | INTRAMUSCULAR | Status: DC | PRN
  Administered 2020-08-15 – 2020-08-16 (×2): 4 mg via INTRAVENOUS
  Filled 2020-08-15 (×2): qty 2

## 2020-08-15 MED ORDER — ASCORBIC ACID 500 MG PO TABS
1000.0000 mg | ORAL_TABLET | Freq: Every day | ORAL | Status: DC
  Administered 2020-08-15 – 2020-08-16 (×2): 1000 mg via ORAL
  Filled 2020-08-15 (×2): qty 2

## 2020-08-15 MED ORDER — ENOXAPARIN SODIUM 40 MG/0.4ML ~~LOC~~ SOLN
40.0000 mg | SUBCUTANEOUS | Status: DC
  Filled 2020-08-15: qty 0.4

## 2020-08-15 MED ORDER — PANTOPRAZOLE SODIUM 40 MG PO TBEC
40.0000 mg | DELAYED_RELEASE_TABLET | Freq: Every day | ORAL | Status: DC
  Administered 2020-08-15 – 2020-08-16 (×2): 40 mg via ORAL
  Filled 2020-08-15 (×2): qty 1

## 2020-08-15 MED ORDER — MAGNESIUM SULFATE 4 GM/100ML IV SOLN
4.0000 g | Freq: Once | INTRAVENOUS | Status: AC
  Administered 2020-08-15: 4 g via INTRAVENOUS
  Filled 2020-08-15: qty 100

## 2020-08-15 MED ORDER — POTASSIUM CHLORIDE CRYS ER 20 MEQ PO TBCR
40.0000 meq | EXTENDED_RELEASE_TABLET | ORAL | Status: AC
  Administered 2020-08-15 (×2): 40 meq via ORAL
  Filled 2020-08-15 (×3): qty 2

## 2020-08-15 MED ORDER — HYDROCHLOROTHIAZIDE 25 MG PO TABS: 25.0000 mg | ORAL_TABLET | Freq: Every day | ORAL | Status: DC

## 2020-08-15 MED ORDER — HYDROCODONE-ACETAMINOPHEN 5-325 MG PO TABS: 1.0000 | ORAL_TABLET | Freq: Four times a day (QID) | ORAL | Status: DC | PRN

## 2020-08-15 MED ORDER — GABAPENTIN 100 MG PO CAPS
100.0000 mg | ORAL_CAPSULE | Freq: Two times a day (BID) | ORAL | Status: DC
  Administered 2020-08-15 – 2020-08-16 (×4): 100 mg via ORAL
  Filled 2020-08-15 (×4): qty 1

## 2020-08-15 MED ORDER — LOSARTAN POTASSIUM 50 MG PO TABS
100.0000 mg | ORAL_TABLET | Freq: Every day | ORAL | Status: DC
  Administered 2020-08-15 – 2020-08-16 (×2): 100 mg via ORAL
  Filled 2020-08-15 (×2): qty 2

## 2020-08-15 MED ORDER — TRAMADOL HCL 50 MG PO TABS
50.0000 mg | ORAL_TABLET | Freq: Four times a day (QID) | ORAL | Status: DC | PRN
Start: 2020-08-15 — End: 2020-08-16
  Administered 2020-08-15: 50 mg via ORAL
  Filled 2020-08-15: qty 1

## 2020-08-15 MED ORDER — SIMVASTATIN 20 MG PO TABS
40.0000 mg | ORAL_TABLET | Freq: Every day | ORAL | Status: DC
  Administered 2020-08-15 – 2020-08-16 (×2): 40 mg via ORAL
  Filled 2020-08-15 (×2): qty 2

## 2020-08-15 MED ORDER — PSYLLIUM 95 % PO PACK
1.0000 | PACK | Freq: Every day | ORAL | Status: DC
  Administered 2020-08-15: 1 via ORAL
  Filled 2020-08-15: qty 1

## 2020-08-15 NOTE — H&P (Signed)
History and Physical    Connie Branch WCB:[id number] DOB: 04/20/1951 DOA: 08/14/2020  PCP: Kathyrn Lass, MD  Patient coming from: Home  I have personally briefly reviewed patient's old medical records in [place]  Chief Complaint: abdominal pain and constipation  HPI: Connie Branch is a 70 y.o. female with medical history significant for hypertension, type 2 diabetes, hyperlipidemia and GERD who presents with concerns of abdominal pain and constipation.  Patient notes constipation and suprapubic pain for the past several days.  Last bowel movement about a week ago.  Has been taking Maalox with only minimal stool output.  Unclear whether she has dark stools but denies any bright red blood per rectum.  Denies any urinary symptoms including dysuria, increased frequency or urgency.  Has prior remote history of hysterectomy for fibroids.  Has never had a colonoscopy since she does not want it but has been doing stool cards.  Last one in November 2021 was negative.  Has fluctuating weight because she is intentionally fasting. Patient denies tobacco, alcohol illicit drug use.  ED Course: She was afebrile, normotensive on room air.  No leukocytosis, hemoglobin of 8.9.  Potassium of 2.2.  Magnesium 1.  BG of 100.    CT of the abdomen showed diverticulitis  Review of Systems: Constitutional: No Weight Change, No Fever ENT/Mouth: No sore throat, No Rhinorrhea Eyes: No Eye Pain, No Vision Changes Cardiovascular: No Chest Pain, no SOB Respiratory: No Cough, No Sputum Gastrointestinal: + Nausea, No Vomiting, No Diarrhea,+Constipation, + Pain Genitourinary: no Urinary Incontinence, No Urgency, No Flank Pain Musculoskeletal: No Arthralgias, No Myalgias Skin: No Skin Lesions, No Pruritus, Neuro: no Weakness, No Numbness Psych: No Anxiety/Panic, No Depression, no decrease appetite Heme/Lymph: No Bruising, No Bleeding   Past Medical History:  Diagnosis Date  . Anxiety    panic  attacks  . ARF (acute renal failure) secondary to dehydration from osmotic diuresis 05/21/2013   KIDNEY FUNCTION NORMAL NOW  . Arthritis   . Cataracts, bilateral   . DDD (degenerative disc disease)   . 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 GERD (gastroesophageal reflux disease)   . Headache    SINUS  . High cholesterol   . History of claustrophobia   . Hypertension   . Neuropathy 2015   feet  . Obesity   . Osteopenia     Past Surgical History:  Procedure Laterality Date  . ABDOMINAL HYSTERECTOMY     COMPLETE  . BREAST SURGERY Left 04/20/2020   BX  . KNEE JOINT MANIPULATION Right   . OVARIAN CYST AND OVARY REMOVED  YRS AGO  . TOTAL KNEE ARTHROPLASTY Right 06/21/2017   Procedure: RIGHT TOTAL KNEE ARTHROPLASTY;  Surgeon: Dorna Leitz, MD;  Location: [facility];  Service: Orthopedics;  Laterality: Right;  . TOTAL KNEE ARTHROPLASTY Left 07/18/2020   Procedure: TOTAL KNEE ARTHROPLASTY;  Surgeon: Gaynelle Arabian, MD;  Location: [facility];  Service: Orthopedics;  Laterality: Left;  21min  . TOTAL KNEE REVISION Right 07/22/2019   Procedure: Right knee polyethylene exchange;  Surgeon: Gaynelle Arabian, MD;  Location: [facility];  Service: Orthopedics;  Laterality: Right;  140min     reports that she quit smoking about 7 years ago. Her smoking use included cigarettes. She has a 12.50 pack-year smoking history. She has never used smokeless tobacco. She reports previous alcohol use. She reports that she does not use drugs. Social History  Allergies  Allergen Reactions  . Erythromycin Anaphylaxis  . Aspirin Nausea And Vomiting  .  Penicillins Other (See Comments)    Childhood allergy Has patient had a PCN reaction causing immediate rash, facial/tongue/throat swelling, SOB or lightheadedness with hypotension: Unknown Has patient had a PCN reaction causing severe rash involving mucus membranes or skin necrosis: Unknown Has patient had a PCN reaction that required hospitalization:Unknown Has  patient had a PCN reaction occurring within the last 10 years:No  Tolerated Cephalosporin Date: 07/19/20.    Family History  Problem Relation Age of Onset  . Breast cancer Neg Hx      Prior to Admission medications   Medication Sig Start Date End Date Taking? Authorizing Provider  acetaminophen (TYLENOL) 500 MG tablet Take 1,000 mg by mouth every 6 (six) hours as needed for moderate pain.   Yes [provider]  Ascorbic Acid (VITAMIN C) 1000 MG tablet Take 1,000 mg by mouth daily.   Yes [provider]  Calcium Carbonate-Simethicone (MAALOX MAX PO) Take 5-10 mLs by mouth 3 (three) times daily as needed (for acid reflux/heartburn.).    Yes [provider]  gabapentin (NEURONTIN) 100 MG capsule Take a 100 mg capsule three times a day for two weeks following surgery.Then take a 100 mg capsule two times a day for two weeks. Then take a 100 mg capsule once a day for two weeks. Then discontinue. Patient taking differently: Take 100 mg by mouth 2 (two) times daily. 07/19/20  Yes Edmisten, Kristie L, PA  hydrochlorothiazide (HYDRODIURIL) 25 MG tablet Take 25 mg by mouth daily. 06/29/19  Yes [provider]  HYDROcodone-acetaminophen (NORCO/VICODIN) 5-325 MG tablet Take 1-2 tablets by mouth every 6 (six) hours as needed for severe pain. 07/19/20  Yes Edmisten, Kristie L, PA  losartan (COZAAR) 100 MG tablet Take 100 mg by mouth daily. 07/03/19  Yes [provider]  metFORMIN (GLUCOPHAGE) 500 MG tablet Take 500 mg by mouth 2 (two) times daily as needed (for blood sugar). 04/26/17  Yes [provider]  methocarbamol (ROBAXIN) 500 MG tablet Take 1 tablet (500 mg total) by mouth every 6 (six) hours as needed for muscle spasms. 07/19/20  Yes Edmisten, Kristie L, PA  omeprazole (PRILOSEC) 40 MG capsule Take 40 mg by mouth daily. 05/29/17  Yes [provider]  ondansetron (ZOFRAN-ODT) 4 MG disintegrating tablet Take 4 mg by mouth every 8 (eight) hours  as needed for nausea or vomiting.   Yes [provider]  simvastatin (ZOCOR) 40 MG tablet Take 40 mg by mouth daily. 05/25/17  Yes [provider]  sulfamethoxazole-trimethoprim (BACTRIM DS) 800-160 MG tablet Take 1 tablet by mouth 2 (two) times daily. Start date :  08/12/20 08/12/20  Yes [provider]  traMADol (ULTRAM) 50 MG tablet Take 1-2 tablets (50-100 mg total) by mouth every 6 (six) hours as needed for moderate pain. 07/19/20  Yes Edmisten, Kristie L, PA  Blood Glucose Monitoring Suppl (BLOOD GLUCOSE METER) kit Use as instructed 05/24/13   Rama, Venetia Maxon, MD  rivaroxaban (XARELTO) 10 MG TABS tablet Take 1 tablet (10 mg total) by mouth daily with breakfast for 20 days. Patient not taking: Reported on 08/14/2020 07/19/20 08/08/20  Derl Barrow, [place]    Physical Exam: Vitals:   08/14/20 1922 08/14/20 2045 08/14/20 2100 08/14/20 2315  BP: (!) 153/77 (!) 146/126 (!) 150/65 (!) 147/78  Pulse: 76 94 76 (!) 105  Resp: $Remo'15 19 16 'GvNzi$ (!) 23  Temp:      SpO2: 100% 100% 100% 100%    Constitutional: NAD, calm, comfortable, non-toxic appearing female  laying flat in bed Vitals:   08/14/20 1922 08/14/20 2045 08/14/20 2100 08/14/20 2315  BP: (!) 153/77 (!) 146/126 (!) 150/65 (!) 147/78  Pulse: 76 94 76 (!) 105  Resp: $Remo'15 19 16 'gkZuF$ (!) 23  Temp:      SpO2: 100% 100% 100% 100%   Eyes: PERRL, lids and conjunctivae normal ENMT: Mucous membranes are moist. Neck: normal, supple Respiratory: clear to auscultation bilaterally, no wheezing, no crackles. Normal respiratory effort. No accessory muscle use.  Cardiovascular: Regular rate and rhythm, no murmurs / rubs / gallops. No extremity edema.  Abdomen: Moderate suprapubic tenderness without guarding, rebound tenderness or rigidity, no masses palpated.  Bowel sounds positive.  Musculoskeletal: no clubbing / cyanosis. No joint deformity upper and lower extremities. Good ROM, no contractures. Normal muscle tone.  Skin: no rashes,  lesions, ulcers. No induration Neurologic: CN 2-12 grossly intact. Sensation intact. Strength 5/5 in all 4.  Psychiatric: Normal judgment and insight. Alert and oriented x 3. Normal mood.     Labs on Admission: I have personally reviewed following labs and imaging studies  CBC: Recent Labs  Lab 08/14/20 1506  WBC 6.9  NEUTROABS 4.1  HGB 8.9*  HCT 27.5*  MCV 91.4  PLT 563   Basic Metabolic Panel: Recent Labs  Lab 08/14/20 1506 08/14/20 1606  NA 140  --   K 2.2*  --   CL 100  --   CO2 28  --   GLUCOSE 100*  --   BUN 14  --   CREATININE 0.89  --   CALCIUM 8.2*  --   MG  --  1.0*   GFR: CrCl cannot be calculated (Unknown ideal weight.). Liver Function Tests: Recent Labs  Lab 08/14/20 1506  AST 10*  ALT 10  ALKPHOS 71  BILITOT 0.7  PROT 7.4  ALBUMIN 4.0   Recent Labs  Lab 08/14/20 1506  LIPASE 28   No results for input(s): AMMONIA in the last 168 hours. Coagulation Profile: No results for input(s): INR, PROTIME in the last 168 hours. Cardiac Enzymes: No results for input(s): CKTOTAL, CKMB, CKMBINDEX, TROPONINI in the last 168 hours. BNP (last 3 results) No results for input(s): PROBNP in the last 8760 hours. HbA1C: No results for input(s): HGBA1C in the last 72 hours. CBG: No results for input(s): GLUCAP in the last 168 hours. Lipid Profile: No results for input(s): CHOL, HDL, LDLCALC, TRIG, CHOLHDL, LDLDIRECT in the last 72 hours. Thyroid Function Tests: No results for input(s): TSH, T4TOTAL, FREET4, T3FREE, THYROIDAB in the last 72 hours. Anemia Panel: No results for input(s): VITAMINB12, FOLATE, FERRITIN, TIBC, IRON, RETICCTPCT in the last 72 hours. Urine analysis:    Component Value Date/Time   COLORURINE YELLOW 08/14/2020 [place] 08/14/2020 1645   LABSPEC 1.023 08/14/2020 1645   PHURINE 8.0 08/14/2020 1645   GLUCOSEU NEGATIVE 08/14/2020 1645   HGBUR NEGATIVE 08/14/2020 1645   BILIRUBINUR NEGATIVE 08/14/2020 1645    KETONESUR 5 (A) 08/14/2020 1645   PROTEINUR 30 (A) 08/14/2020 1645   UROBILINOGEN 0.2 05/21/2013 1145   NITRITE NEGATIVE 08/14/2020 1645   LEUKOCYTESUR NEGATIVE 08/14/2020 1645    Radiological Exams on Admission: CT Abdomen Pelvis W Contrast  Result Date: 08/14/2020 CLINICAL DATA:  Diffuse abdominal pain. EXAM: CT ABDOMEN AND PELVIS WITH CONTRAST TECHNIQUE: Multidetector CT imaging of the abdomen and pelvis was performed using the standard protocol following bolus administration of intravenous contrast. CONTRAST:  164mL OMNIPAQUE IOHEXOL 300 MG/ML  SOLN COMPARISON:  None. FINDINGS:  Lower chest: No acute abnormality. Hepatobiliary: Diffuse fatty infiltration of the liver parenchyma is seen. No focal liver abnormality is seen. No gallstones, gallbladder wall thickening, or biliary dilatation. Pancreas: Unremarkable. No pancreatic ductal dilatation or surrounding inflammatory changes. Spleen: Normal in size without focal abnormality. Adrenals/Urinary Tract: Adrenal glands are unremarkable. Kidneys are normal in size, without renal calculi or hydronephrosis. 4.1 cm and 6.3 cm diameter cysts are seen within the right kidney. The urinary bladder is contracted and subsequently limited in evaluation. Stomach/Bowel: Stomach is within normal limits. Appendix appears normal. No evidence of bowel dilatation. Markedly inflamed diverticula are seen within the proximal sigmoid colon. There is no evidence of associated perforation or abscess. Vascular/Lymphatic: Aortic atherosclerosis. No enlarged abdominal or pelvic lymph nodes. Reproductive: Status post hysterectomy. No adnexal masses. Other: No abdominal wall hernia or abnormality. No abdominopelvic ascites. Musculoskeletal: No acute or significant osseous findings. IMPRESSION: 1. Marked severity sigmoid diverticulitis. 2. Large simple cyst within the right kidney. 3. Aortic atherosclerosis. Aortic Atherosclerosis (ICD10-I70.0). Electronically Signed   By: Virgina Norfolk M.D.   On: 08/14/2020 22:47      Assessment/Plan  Acute diverticulitis  Continue IV Cipro and Flagyl  Clear liquid diet and can advance as tolerated  Pt has never had colonoscopy but has had negative stool cards  Anemia Patient had normal hemoglobin back about a month ago but hemoglobin has been decreasing since.   -She recently had left knee arthroplasty on 3/16 and was on 20 days of Xarelto for DVT prophylaxis which she has completed - no dark stool that she is aware of - check iron panel, Vitamin W41, folic acid   Hypokalemia Replete with oral and IV  Hypomagnesemia Repleted with IV Mg  Hypocalcemia Likely due to HypoMg. Repeat in the morning after Mg is repleted  Type 2 DM  BG of 100 monitor with morning lab for now  HTN continue HCTZ, Losartan  HLD continue statin   DVT prophylaxis:.Lovenox Code Status: Full Family Communication: Plan discussed with patient at bedside  disposition Plan: Home with observation Consults called:  Admission status: Observation  Level of care: Med-Surg  Status is: Observation  The patient remains OBS appropriate and will d/c before 2 midnights.  Dispo: The patient is from: Home              Anticipated d/c is to: Home              Patient currently is not medically stable to d/c.   Difficult to place patient No         Orene Desanctis DO [facility]   If 7PM-7AM, please contact night-coverage [email or web address]   08/15/2020, 12:07 AM

## 2020-08-15 NOTE — Evaluation (Signed)
Physical Therapy Evaluation Patient Details Name: Connie Branch MRN: [id number] DOB: Jul 08, 1950 Today's Date: 08/15/2020   History of Present Illness  70 year old female admitted for acute diverticulitis without perforation or abscess, severe hypokalemia and hypomagnesemia.  PMH of HTN, DM-2, HLD, GERD, claustrophobia, neuropathy, R TKA 19 with manipulation and revision in 2021 and recent left TKA 07/18/20  Clinical Impression  Pt admitted with above diagnosis.  Pt currently with functional limitations due to the deficits listed below (see PT Problem List). Pt will benefit from skilled PT to increase their independence and safety with mobility to allow discharge to the venue listed below.   Pt eager to mobilize on arrival to room and tolerated distance well.  Pt reports she was working with OPPT prior to admission.  Pt requesting bathroom upon returning to room and reports she had performed her exercises earlier today.  Pt would benefit from ambulating with nursing staff during acute stay.     Follow Up Recommendations Outpatient PT (resume OPPT, previous post op L TKA)    Equipment Recommendations  None recommended by PT    Recommendations for Other Services       Precautions / Restrictions Precautions Precautions: Knee Restrictions LLE Weight Bearing: Weight bearing as tolerated      Mobility  Bed Mobility Overal bed mobility: Needs Assistance Bed Mobility: Sit to Supine;Supine to Sit     Supine to sit: Supervision;HOB elevated          Transfers Overall transfer level: Needs assistance Equipment used: Rolling walker (2 wheeled) Transfers: Sit to/from Stand Sit to Stand: Min guard;Supervision         General transfer comment: min/guard for safety and lines  Ambulation/Gait Ambulation/Gait assistance: Min Emergency planning/management officer (Feet): 280 Feet Assistive device: Rolling walker (2 wheeled) Gait Pattern/deviations: Decreased stride length;Step-through  pattern     General Gait Details: pt mobilizing well with RW, no unsteadiness or LOB observed  Stairs            Wheelchair Mobility    Modified Rankin (Stroke Patients Only)       Balance                                             Pertinent Vitals/Pain Pain Assessment: No/denies pain Pain Intervention(s): Monitored during session    Home Living Family/patient expects to be discharged to:: Private residence Living Arrangements: Other (Comment) (nephew staying with her) Available Help at Discharge: Available 24 hours/day Type of Home: House Home Access: Stairs to enter Entrance Stairs-Rails: None Entrance Stairs-Number of Steps: 1 curb Home Layout: Two level;Bed/bath upstairs;1/2 bath on main level Home Equipment: Emergency planning/management officer - 2 wheels;Cane - single point;Grab bars - tub/shower;Grab bars - toilet (reports needs new cane) Additional Comments: Had BSC but did not like so she got rid of it and got elevated toilet and rails    Prior Function Level of Independence: Needs assistance   Gait / Transfers Assistance Needed: using RW s/p Left TKA     Comments: was going to OPPT for TKA rehab     Hand Dominance   Dominant Hand: Right    Extremity/Trunk Assessment        Lower Extremity Assessment Lower Extremity Assessment: RLE deficits/detail;LLE deficits/detail RLE Sensation: history of peripheral neuropathy LLE Deficits / Details: observed at least 80* AROM with pt sitting EOB LLE Sensation: history of peripheral  neuropathy       Communication   Communication: No difficulties  Cognition Arousal/Alertness: Awake/alert Behavior During Therapy: WFL for tasks assessed/performed Overall Cognitive Status: Within Functional Limits for tasks assessed                                        General Comments      Exercises     Assessment/Plan    PT Assessment Patient needs continued PT services  PT Problem List  Decreased strength;Decreased mobility;Decreased range of motion;Decreased knowledge of precautions;Decreased activity tolerance;Decreased balance;Decreased knowledge of use of DME       PT Treatment Interventions DME instruction;Therapeutic activities;Gait training;Therapeutic exercise;Patient/family education;Balance training;Stair training;Functional mobility training    PT Goals (Current goals can be found in the Care Plan section)  Acute Rehab PT Goals PT Goal Formulation: With patient Time For Goal Achievement: 08/29/20 Potential to Achieve Goals: Good    Frequency 7X/week   Barriers to discharge        Co-evaluation               AM-PAC PT "6 Clicks" Mobility  Outcome Measure Help needed turning from your back to your side while in a flat bed without using bedrails?: A Little Help needed moving from lying on your back to sitting on the side of a flat bed without using bedrails?: A Little Help needed moving to and from a bed to a chair (including a wheelchair)?: A Little Help needed standing up from a chair using your arms (e.g., wheelchair or bedside chair)?: A Little Help needed to walk in hospital room?: A Little Help needed climbing 3-5 steps with a railing? : A Little 6 Click Score: 18    End of Session   Activity Tolerance: Patient tolerated treatment well Patient left:  (in bathroom, RN aware and to assist) Nurse Communication: Mobility status PT Visit Diagnosis: Other abnormalities of gait and mobility (R26.89);Muscle weakness (generalized) (M62.81)    Time: 2353-6144 PT Time Calculation (min) (ACUTE ONLY): 18 min   Charges:   PT Evaluation $PT Eval Low Complexity: 1 Low        Kati PT, DPT Acute Rehabilitation Services Pager: [phone] Office: [phone]  Maida Sale E 08/15/2020, 2:59 PM

## 2020-08-15 NOTE — Progress Notes (Signed)
PROGRESS NOTE  Connie Branch WUJ:[id number] DOB: 09/11/1950   PCP: Sigmund Hazel, MD  Patient is from: Home.  DOA: 08/14/2020 LOS: 0  Chief complaints: Abdominal pain and constipation  Brief Narrative / Interim history: 70 year old F with PMH of HTN, DM-2, HLD, GERD and recent left TKA about 3 weeks ago presenting with abdominal pain and constipation, and admitted for acute diverticulitis without perforation or abscess, severe hypokalemia and hypomagnesemia.  She also had a drop in hemoglobin from baseline.  She never had colonoscopy.  Subjective: Seen and examined earlier this morning.  No major events overnight or this morning.  Reports some improvement in her pain.  Reports nausea but no emesis.  She denies chest pain or shortness of breath.  Denies hematochezia but not sure about melena.  She also has not had a bowel movement in about a week now.   Objective: Vitals:   08/15/20 0000 08/15/20 0025 08/15/20 0054 08/15/20 0405  BP: (!) 143/72  134/89 127/83  Pulse: 96  89 84  Resp: 11  17 16   Temp:   97.9 F (36.6 C) 97.9 F (36.6 C)  TempSrc:   Oral Axillary  SpO2: 98%  100% 100%  Weight:  68 kg    Height:  5\' 4"  (1.626 m)      Intake/Output Summary (Last 24 hours) at 08/15/2020 1008 Last data filed at 08/15/2020 0917 Gross per 24 hour  Intake 520 ml  Output --  Net 520 ml   Filed Weights   08/15/20 0025  Weight: 68 kg    Examination:  GENERAL: No apparent distress.  Nontoxic. HEENT: MMM.  Vision and hearing grossly intact.  NECK: Supple.  No apparent JVD.  RESP:  No IWOB.  Fair aeration bilaterally. CVS:  RRR. Heart sounds normal.  ABD/GI/GU: BS+. Abd soft.  Tenderness over LLQ. MSK/EXT:  Moves extremities.  Old surgical scar on right knee.  Healing surgical scar on left knee SKIN: no apparent skin lesion or wound NEURO: Awake, alert and oriented appropriately.  No apparent focal neuro deficit. PSYCH: Calm. Normal affect.   Procedures:   None  Microbiology summarized: COVID-19 PCR pending. Urine culture pending.  Assessment & Plan: Acute sigmoid diverticulitis:-CT abdomen and pelvis showed marked severity of sigmoid diverticulitis without abscess or perforation.  She has LLQ tenderness.  Diverticulitis could have been provoked by opiate related constipation after recent surgery.  She never had colonoscopy. -Continue clear liquid diet.  May advance to full liquid diet if she tolerates -Continue IV Cipro and Flagyl.  She has history of penicillin allergy -Metamucil for constipation -Needs colonoscopy in 3 to 4 weeks after treatment of diverticulitis.  Normocytic anemia: She had left TKA on 3/15.  She might have some blood loss at that time.  Anemia panel suggests anemia of chronic disease but have to check ferritin level.  She is not sure about melena but denies hematochezia. Recent Labs    05/26/20 0945 07/08/20 0939 07/19/20 0322 07/20/20 0317 08/14/20 1506 08/15/20 0426  HGB 11.9* 12.5 9.8* 9.5* 8.9* 8.2*  -Discontinued Lovenox.  SCD for VTE prophylaxis until H&H stable -Continue monitoring  Severe hypokalemia/hypomagnesemia: K 2.2>> 2.9.  Mg 1.0>> 1.6. -K-Dur 40 mEq x 2 -IV magnesium sulfate 4 g x 1 -Recheck in the morning  Hypocalcemia: Likely due to hypomagnesemia. -IV calcium gluconate  Controlled NIDDM-2: A1c 4.9% in 05/2020.  Takes Metformin at home.  CBG within fair range. -Monitor glucose with daily labs  Essential HTN -Continue home losartan -Discontinue HCTZ  in the setting of hypokalemia  Hyperlipidemia -Continue home statin.  Constipation: Likely due to opiate use after surgery. -Start with Metamucil given ongoing diverticulitis.  If no improvement, may have to try MiraLAX  Recent left TKA: Surgical wound appears to be healing nicely. -PT/OT eval  Body mass index is 25.75 kg/m.         DVT prophylaxis:  enoxaparin (LOVENOX) injection 40 mg Start: 08/15/20 1000  Code Status:  Full code Family Communication: Patient and/or RN. Available if any question.  Level of care: Med-Surg Status is: Observation  The patient will require care spanning > 2 midnights and should be moved to inpatient because: Persistent severe electrolyte disturbances, IV treatments appropriate due to intensity of illness or inability to take PO and Inpatient level of care appropriate due to severity of illness  Dispo: The patient is from: Home              Anticipated d/c is to: Home              Patient currently is not medically stable to d/c.   Difficult to place patient No       Consultants:  None   Sch Meds:  Scheduled Meds: . vitamin C  1,000 mg Oral Daily  . enoxaparin (LOVENOX) injection  40 mg Subcutaneous Q24H  . gabapentin  100 mg Oral BID  . LORazepam  1 mg Intravenous Once  . losartan  100 mg Oral Daily  . pantoprazole  40 mg Oral Daily  . potassium chloride  40 mEq Oral Q4H  . psyllium  1 packet Oral Daily  . simvastatin  40 mg Oral Daily   Continuous Infusions: . ciprofloxacin    . magnesium sulfate bolus IVPB 4 g (08/15/20 0917)  . metronidazole     PRN Meds:.HYDROcodone-acetaminophen, ondansetron (ZOFRAN) IV, traMADol  Antimicrobials: Anti-infectives (From admission, onward)   Start     Dose/Rate Route Frequency Ordered Stop   08/15/20 1600  ciprofloxacin (CIPRO) IVPB 400 mg        400 mg 200 mL/hr over 60 Minutes Intravenous Every 12 hours 08/15/20 0412     08/15/20 1200  metroNIDAZOLE (FLAGYL) IVPB 500 mg        500 mg 100 mL/hr over 60 Minutes Intravenous Every 8 hours 08/15/20 0004     08/14/20 2300  ciprofloxacin (CIPRO) IVPB 400 mg       "And" Linked Group Details   400 mg 200 mL/hr over 60 Minutes Intravenous  Once 08/14/20 2252 08/15/20 0505   08/14/20 2300  metroNIDAZOLE (FLAGYL) IVPB 500 mg       "And" Linked Group Details   500 mg 100 mL/hr over 60 Minutes Intravenous  Once 08/14/20 2252 08/15/20 0357       I have personally  reviewed the following labs and images: CBC: Recent Labs  Lab 08/14/20 1506 08/15/20 0426  WBC 6.9 7.1  NEUTROABS 4.1  --   HGB 8.9* 8.2*  HCT 27.5* 25.8*  MCV 91.4 92.1  PLT 194 164   BMP &GFR Recent Labs  Lab 08/14/20 1506 08/14/20 1606 08/15/20 0426  NA 140  --  137  K 2.2*  --  2.9*  CL 100  --  102  CO2 28  --  25  GLUCOSE 100*  --  111*  BUN 14  --  10  CREATININE 0.89  --  0.77  CALCIUM 8.2*  --  7.9*  MG  --  1.0* 1.6*  Estimated Creatinine Clearance: 62.9 mL/min (by C-G formula based on SCr of 0.77 mg/dL). Liver & Pancreas: Recent Labs  Lab 08/14/20 1506  AST 10*  ALT 10  ALKPHOS 71  BILITOT 0.7  PROT 7.4  ALBUMIN 4.0   Recent Labs  Lab 08/14/20 1506  LIPASE 28   No results for input(s): AMMONIA in the last 168 hours. Diabetic: No results for input(s): HGBA1C in the last 72 hours. No results for input(s): GLUCAP in the last 168 hours. Cardiac Enzymes: No results for input(s): CKTOTAL, CKMB, CKMBINDEX, TROPONINI in the last 168 hours. No results for input(s): PROBNP in the last 8760 hours. Coagulation Profile: No results for input(s): INR, PROTIME in the last 168 hours. Thyroid Function Tests: No results for input(s): TSH, T4TOTAL, FREET4, T3FREE, THYROIDAB in the last 72 hours. Lipid Profile: No results for input(s): CHOL, HDL, LDLCALC, TRIG, CHOLHDL, LDLDIRECT in the last 72 hours. Anemia Panel: Recent Labs    08/15/20 0426  VITAMINB12 661  FOLATE 10.4  TIBC 239*  IRON 34   Urine analysis:    Component Value Date/Time   COLORURINE YELLOW 08/14/2020 1645   APPEARANCEUR CLEAR 08/14/2020 1645   LABSPEC 1.023 08/14/2020 1645   PHURINE 8.0 08/14/2020 1645   GLUCOSEU NEGATIVE 08/14/2020 1645   HGBUR NEGATIVE 08/14/2020 1645   BILIRUBINUR NEGATIVE 08/14/2020 1645   KETONESUR 5 (A) 08/14/2020 1645   PROTEINUR 30 (A) 08/14/2020 1645   UROBILINOGEN 0.2 05/21/2013 1145   NITRITE NEGATIVE 08/14/2020 1645   LEUKOCYTESUR NEGATIVE  08/14/2020 1645   Sepsis Labs: Invalid input(s): PROCALCITONIN, LACTICIDVEN  Microbiology: No results found for this or any previous visit (from the past 240 hour(s)).  Radiology Studies: CT Abdomen Pelvis W Contrast  Result Date: 08/14/2020 CLINICAL DATA:  Diffuse abdominal pain. EXAM: CT ABDOMEN AND PELVIS WITH CONTRAST TECHNIQUE: Multidetector CT imaging of the abdomen and pelvis was performed using the standard protocol following bolus administration of intravenous contrast. CONTRAST:  OMNIPAQUE IOHEXOL 300 MG/ML  SOLN COMPARISON:  None. FINDINGS: Lower chest: No acute abnormality. Hepatobiliary: Diffuse fatty infiltration of the liver parenchyma is seen. No focal liver abnormality is seen. No gallstones, gallbladder wall thickening, or biliary dilatation. Pancreas: Unremarkable. No pancreatic ductal dilatation or surrounding inflammatory changes. Spleen: Normal in size without focal abnormality. Adrenals/Urinary Tract: Adrenal glands are unremarkable. Kidneys are normal in size, without renal calculi or hydronephrosis. 4.1 cm and 6.3 cm diameter cysts are seen within the right kidney. The urinary bladder is contracted and subsequently limited in evaluation. Stomach/Bowel: Stomach is within normal limits. Appendix appears normal. No evidence of bowel dilatation. Markedly inflamed diverticula are seen within the proximal sigmoid colon. There is no evidence of associated perforation or abscess. Vascular/Lymphatic: Aortic atherosclerosis. No enlarged abdominal or pelvic lymph nodes. Reproductive: Status post hysterectomy. No adnexal masses. Other: No abdominal wall hernia or abnormality. No abdominopelvic ascites. Musculoskeletal: No acute or significant osseous findings. IMPRESSION: 1. Marked severity sigmoid diverticulitis. 2. Large simple cyst within the right kidney. 3. Aortic atherosclerosis. Aortic Atherosclerosis (ICD10-I70.0). Electronically Signed   By: Aram Candela M.D.   On:  08/14/2020 22:47      Lisette Mancebo T. Labib Cwynar [facility]  If 7PM-7AM, please contact night-coverage [email or web address] 08/15/2020, 10:08 AM

## 2020-08-15 NOTE — Progress Notes (Signed)
Pharmacy Antibiotic Note  Connie Branch is a 70 y.o. female admitted on 08/14/2020 with suprapubic pain times several days.  Pharmacy has been consulted to dose cipro for intra-abdominal infection.  Plan: cipro 400mg  IV q12h Follow renal function and clinical course    Temp (24hrs), Avg:98.5 F (36.9 C), Min:98.5 F (36.9 C), Max:98.5 F (36.9 C)  Recent Labs  Lab 08/14/20 1506  WBC 6.9  CREATININE 0.89    CrCl cannot be calculated (Unknown ideal weight.).    Allergies  Allergen Reactions  . Erythromycin Anaphylaxis  . Aspirin Nausea And Vomiting  . Penicillins Other (See Comments)    Childhood allergy Has patient had a PCN reaction causing immediate rash, facial/tongue/throat swelling, SOB or lightheadedness with hypotension: Unknown Has patient had a PCN reaction causing severe rash involving mucus membranes or skin necrosis: Unknown Has patient had a PCN reaction that required hospitalization:Unknown Has patient had a PCN reaction occurring within the last 10 years:No  Tolerated Cephalosporin Date: 07/19/20.    Antimicrobials this admission: 4/11 cipro >> 4/11 flagyl >> Dose adjustments this admission:   Microbiology results: 4/10 UCx:    Thank you for allowing pharmacy to be a part of this patient's care.  6/10 RPh 08/15/2020, 12:24 AM

## 2020-08-15 NOTE — Plan of Care (Signed)

## 2020-08-16 ENCOUNTER — Observation Stay (HOSPITAL_COMMUNITY): Payer: Medicare HMO

## 2020-08-16 DIAGNOSIS — K5792 Diverticulitis of intestine, part unspecified, without perforation or abscess without bleeding: Secondary | ICD-10-CM | POA: Diagnosis not present

## 2020-08-16 DIAGNOSIS — I10 Essential (primary) hypertension: Secondary | ICD-10-CM | POA: Diagnosis not present

## 2020-08-16 DIAGNOSIS — K5903 Drug induced constipation: Secondary | ICD-10-CM

## 2020-08-16 DIAGNOSIS — D649 Anemia, unspecified: Secondary | ICD-10-CM | POA: Diagnosis not present

## 2020-08-16 DIAGNOSIS — E876 Hypokalemia: Secondary | ICD-10-CM | POA: Diagnosis not present

## 2020-08-16 LAB — CBC
HCT: 26.4 % — ABNORMAL LOW (ref 36.0–46.0)
Hemoglobin: 8.5 g/dL — ABNORMAL LOW (ref 12.0–15.0)
MCH: 29.8 pg (ref 26.0–34.0)
MCHC: 32.2 g/dL (ref 30.0–36.0)
MCV: 92.6 fL (ref 80.0–100.0)
Platelets: 183 10*3/uL (ref 150–400)
RBC: 2.85 MIL/uL — ABNORMAL LOW (ref 3.87–5.11)
RDW: 15.5 % (ref 11.5–15.5)
WBC: 6.1 10*3/uL (ref 4.0–10.5)
nRBC: 0 % (ref 0.0–0.2)

## 2020-08-16 LAB — RENAL FUNCTION PANEL
Albumin: 3.3 g/dL — ABNORMAL LOW (ref 3.5–5.0)
Anion gap: 8 (ref 5–15)
BUN: 7 mg/dL — ABNORMAL LOW (ref 8–23)
CO2: 25 mmol/L (ref 22–32)
Calcium: 8.7 mg/dL — ABNORMAL LOW (ref 8.9–10.3)
Chloride: 103 mmol/L (ref 98–111)
Creatinine, Ser: 0.74 mg/dL (ref 0.44–1.00)
GFR, Estimated: >60 mL/min (ref >60)
Glucose, Bld: 106 mg/dL — ABNORMAL HIGH (ref 70–99)
Phosphorus: 3 mg/dL (ref 2.5–4.6)
Potassium: 3.6 mmol/L (ref 3.5–5.1)
Sodium: 136 mmol/L (ref 135–145)

## 2020-08-16 LAB — URINE CULTURE: Culture: <10000 — AB

## 2020-08-16 LAB — MAGNESIUM: Magnesium: 2.2 mg/dL (ref 1.7–2.4)

## 2020-08-16 MED ORDER — POLYETHYLENE GLYCOL 3350 17 GM/SCOOP PO POWD: 17.0000 g | Freq: Two times a day (BID) | ORAL | 0 refills | Status: DC | PRN

## 2020-08-16 MED ORDER — CIPROFLOXACIN HCL 500 MG PO TABS: 500.0000 mg | ORAL_TABLET | Freq: Two times a day (BID) | ORAL | 0 refills | Status: AC

## 2020-08-16 MED ORDER — HYDROCHLOROTHIAZIDE 25 MG PO TABS: 25.0000 mg | ORAL_TABLET | Freq: Every day | ORAL | Status: AC

## 2020-08-16 MED ORDER — POLYETHYLENE GLYCOL 3350 17 G PO PACK: 17.0000 g | PACK | Freq: Two times a day (BID) | ORAL | Status: DC | PRN

## 2020-08-16 MED ORDER — METRONIDAZOLE 500 MG PO TABS: 500.0000 mg | ORAL_TABLET | Freq: Three times a day (TID) | ORAL | 0 refills | Status: AC

## 2020-08-16 MED ORDER — METAMUCIL 48.57 % PO POWD: ORAL | 0 refills | Status: DC

## 2020-08-16 MED ORDER — POTASSIUM CHLORIDE CRYS ER 20 MEQ PO TBCR
40.0000 meq | EXTENDED_RELEASE_TABLET | Freq: Once | ORAL | Status: AC
  Administered 2020-08-16: 40 meq via ORAL
  Filled 2020-08-16: qty 2

## 2020-08-16 MED ORDER — PSYLLIUM 95 % PO PACK
1.0000 | PACK | Freq: Three times a day (TID) | ORAL | Status: DC
  Administered 2020-08-16: 1 via ORAL
  Filled 2020-08-16: qty 1

## 2020-08-16 MED ORDER — PSYLLIUM 95 % PO PACK: 1.0000 | PACK | Freq: Two times a day (BID) | ORAL | Status: DC

## 2020-08-16 MED ORDER — POLYETHYLENE GLYCOL 3350 17 G PO PACK
17.0000 g | PACK | Freq: Three times a day (TID) | ORAL | Status: DC
  Administered 2020-08-16: 17 g via ORAL
  Filled 2020-08-16: qty 1

## 2020-08-16 MED ORDER — ONDANSETRON HCL 4 MG PO TABS: 4.0000 mg | ORAL_TABLET | Freq: Three times a day (TID) | ORAL | 0 refills | Status: AC | PRN

## 2020-08-16 NOTE — Plan of Care (Signed)
Pt was discharged home today. Instructions were reviewed with patient, and questions were answered. Pt was taken to main entrance via wheelchair by NT.  

## 2020-08-16 NOTE — Progress Notes (Signed)
Physical Therapy Treatment Patient Details Name: Connie Branch MRN: [id number] DOB: [birth year] Today's Date: 08/16/2020    History of Present Illness 70 year old female admitted for acute diverticulitis without perforation or abscess, severe hypokalemia and hypomagnesemia.  PMH of HTN, DM-2, HLD, GERD, claustrophobia, neuropathy, R TKA 19 with manipulation and revision in 2021 and recent left TKA 07/18/20    PT Comments    Assisted OOB to amb a functional distance in hallway.  General Gait Details: pt mobilizing well with RW, no unsteadiness or LOB observed.  VC's to increased L knee flex (toe off/heel strike) L LE .  Trial amb without walker as pt is 4 weeks post op TKR notable decreased stride length and increased lateral sway as well as decreased stability.  Rec cont to use walker for safety. Returned to room, pt able to self toilet.  Assisted back to bed to perform TKR TE's.   Total Knee Replacement TE's following HEP handout 10 reps B LE ankle pumps 10 reps towel squeezes 10 reps knee presses 10 reps heel slides  (knee flex 0 - 90 AAROM) 10 reps SAQ's 10 reps SLR's 10 reps ABD Educated on use of gait belt to assist with TE's Followed by ICE Knee pain 3/10  Follow Up Recommendations  Outpatient PT     Equipment Recommendations  None recommended by PT    Recommendations for Other Services       Precautions / Restrictions Precautions Precautions: Knee Precaution Comments: pt aware no pillow under knee Restrictions Weight Bearing Restrictions: No LLE Weight Bearing: Weight bearing as tolerated    Mobility  Bed Mobility Overal bed mobility: Modified Independent             General bed mobility comments: self able    Transfers Overall transfer level: Modified independent Equipment used: Rolling walker (2 wheeled)             General transfer comment: good safety cognition and use of hands to steady self  Ambulation/Gait Ambulation/Gait assistance:  Supervision Gait Distance (Feet): 275 Feet Assistive device: Rolling walker (2 wheeled)   Gait velocity: decreased   General Gait Details: pt mobilizing well with RW, no unsteadiness or LOB observed.  VC's to increased L knee flex (toe off/heel strike) L LE .  Trial amb without walker as pt is 4 weeks post op TKR notable decreased stride length and increased lateral sway as well as decreased stability.  Rec cont to use walker for safety.   Stairs             Wheelchair Mobility    Modified Rankin (Stroke Patients Only)       Balance Overall balance assessment: Modified Independent                                          Cognition Arousal/Alertness: Awake/alert Behavior During Therapy: WFL for tasks assessed/performed Overall Cognitive Status: Within Functional Limits for tasks assessed                                 General Comments: AxO x 3      Exercises      General Comments        Pertinent Vitals/Pain Pain Assessment: Faces Faces Pain Scale: Hurts a little bit Pain Location: L knee Pain Descriptors / Indicators: Discomfort;Sore;Tightness Pain  Intervention(s): Monitored during session;Repositioned;Ice applied    Home Living Family/patient expects to be discharged to:: Private residence Living Arrangements: Other (Comment) (nephew) Available Help at Discharge: Available 24 hours/day Type of Home: House Home Access: Stairs to enter Entrance Stairs-Rails: None Home Layout: Two level;Bed/bath upstairs;1/2 bath on main level Home Equipment: Emergency planning/management officer - 2 wheels;Cane - single point;Grab bars - tub/shower;Grab bars - toilet;Other (comment) (reports needs new cane) Additional Comments: Had BSC but did not like so she got rid of it and got elevated toilet and rails    Prior Function Level of Independence: Needs assistance  Gait / Transfers Assistance Needed: using RW s/p Left TKA ADL's / Homemaking Assistance  Needed: reports independent with self care tasks Comments: was going to [facility] for TKA rehab   PT Goals (current goals can now be found in the care plan section) Acute Rehab PT Goals Patient Stated Goal: home soon Progress towards PT goals: Progressing toward goals    Frequency    7X/week      PT Plan Current plan remains appropriate    Co-evaluation              AM-PAC PT "6 Clicks" Mobility   Outcome Measure  Help needed turning from your back to your side while in a flat bed without using bedrails?: None Help needed moving from lying on your back to sitting on the side of a flat bed without using bedrails?: None Help needed moving to and from a bed to a chair (including a wheelchair)?: None Help needed standing up from a chair using your arms (e.g., wheelchair or bedside chair)?: None Help needed to walk in hospital room?: A Little Help needed climbing 3-5 steps with a railing? : A Little 6 Click Score: 22    End of Session Equipment Utilized During Treatment: Gait belt Activity Tolerance: Patient tolerated treatment well Patient left: in bed;with call bell/phone within reach;with nursing/sitter in room Nurse Communication: Mobility status PT Visit Diagnosis: Other abnormalities of gait and mobility (R26.89);Muscle weakness (generalized) (M62.81) Pain - Right/Left: Left Pain - part of body: Knee     Time: 1135-1205 PT Time Calculation (min) (ACUTE ONLY): 30 min  Charges:  $Gait Training: 8-22 mins $Therapeutic Exercise: 8-22 mins                     Felecia Shelling  PTA Acute  Rehabilitation Services Pager      [phone] Office      [phone]

## 2020-08-16 NOTE — Plan of Care (Signed)

## 2020-08-16 NOTE — Plan of Care (Signed)
  Problem: Education: Goal: Knowledge of General Education information will improve Description: Including pain rating scale, medication(s)/side effects and non-pharmacologic comfort measures 08/16/2020 1316 by Amil Amen, RN Outcome: Adequate for Discharge 08/16/2020 1315 by Amil Amen, RN Outcome: Adequate for Discharge   Problem: Health Behavior/Discharge Planning: Goal: Ability to manage health-related needs will improve 08/16/2020 1316 by Amil Amen, RN Outcome: Adequate for Discharge 08/16/2020 1315 by Amil Amen, RN Outcome: Adequate for Discharge   Problem: Clinical Measurements: Goal: Ability to maintain clinical measurements within normal limits will improve 08/16/2020 1316 by Amil Amen, RN Outcome: Adequate for Discharge 08/16/2020 1315 by Amil Amen, RN Outcome: Adequate for Discharge Goal: Will remain free from infection 08/16/2020 1316 by Amil Amen, RN Outcome: Adequate for Discharge 08/16/2020 1315 by Amil Amen, RN Outcome: Adequate for Discharge Goal: Diagnostic test results will improve 08/16/2020 1316 by Amil Amen, RN Outcome: Adequate for Discharge 08/16/2020 1315 by Amil Amen, RN Outcome: Adequate for Discharge Goal: Respiratory complications will improve 08/16/2020 1316 by Amil Amen, RN Outcome: Adequate for Discharge 08/16/2020 1315 by Amil Amen, RN Outcome: Adequate for Discharge Goal: Cardiovascular complication will be avoided 08/16/2020 1316 by Amil Amen, RN Outcome: Adequate for Discharge 08/16/2020 1315 by Amil Amen, RN Outcome: Adequate for Discharge   Problem: Activity: Goal: Risk for activity intolerance will decrease 08/16/2020 1316 by Amil Amen, RN Outcome: Adequate for Discharge 08/16/2020 1315 by Amil Amen, RN Outcome: Adequate for Discharge   Problem: Nutrition: Goal: Adequate nutrition will be maintained 08/16/2020 1316 by Amil Amen, RN Outcome: Adequate for Discharge 08/16/2020 1315 by Amil Amen,  RN Outcome: Adequate for Discharge   Problem: Coping: Goal: Level of anxiety will decrease 08/16/2020 1316 by Amil Amen, RN Outcome: Adequate for Discharge 08/16/2020 1315 by Amil Amen, RN Outcome: Adequate for Discharge   Problem: Elimination: Goal: Will not experience complications related to bowel motility 08/16/2020 1316 by Amil Amen, RN Outcome: Adequate for Discharge 08/16/2020 1315 by Amil Amen, RN Outcome: Adequate for Discharge Goal: Will not experience complications related to urinary retention 08/16/2020 1316 by Amil Amen, RN Outcome: Adequate for Discharge 08/16/2020 1315 by Amil Amen, RN Outcome: Adequate for Discharge   Problem: Pain Managment: Goal: General experience of comfort will improve 08/16/2020 1316 by Amil Amen, RN Outcome: Adequate for Discharge 08/16/2020 1315 by Amil Amen, RN Outcome: Adequate for Discharge   Problem: Safety: Goal: Ability to remain free from injury will improve 08/16/2020 1316 by Amil Amen, RN Outcome: Adequate for Discharge 08/16/2020 1315 by Amil Amen, RN Outcome: Adequate for Discharge   Problem: Skin Integrity: Goal: Risk for impaired skin integrity will decrease 08/16/2020 1316 by Amil Amen, RN Outcome: Adequate for Discharge 08/16/2020 1315 by Amil Amen, RN Outcome: Adequate for Discharge

## 2020-08-16 NOTE — Discharge Summary (Signed)
Physician Discharge Summary  Connie Branch RKY:[id number] DOB: 02-Mar-1951 DOA: 08/14/2020  PCP: Kathyrn Lass, MD  Admit date: 08/14/2020 Discharge date: 08/16/2020  Admitted From: Home  Disposition: Home  Recommendations for Outpatient Follow-up:  1. Follow ups as below. 2. Please obtain CBC/BMP/Mag at follow up 3. Please follow up on the following pending results: None  Home Health: Patient to resume outpatient PT for her left knee surgery Equipment/Devices: None  Discharge Condition: Stable CODE STATUS: Full code   Follow-up Information    Kathyrn Lass, MD. Schedule an appointment as soon as possible for a visit.   Specialty: Family Medicine Contact information: [place] [address] [phone]                Hospital Course: 70 year old F with PMH of HTN, DM-2, HLD, GERD and recent left TKA about 3 weeks ago presenting with abdominal pain and constipation, and admitted for acute diverticulitis without perforation or abscess, severe hypokalemia and hypomagnesemia.  She also had a drop in hemoglobin from baseline.  She never had colonoscopy.  Patient was on multiple pain medications after recent TKA that might have contributed to constipation and her presentation. Patient was a started on IV Flagyl and Cipro due to history of allergies to penicillin.  She was also started on Metamucil and MiraLAX for constipation.  Eventually, GI symptoms improved.  She had a bowel movement without melena or hematochezia.  Hemoglobin remained stable as well.  Discharged on p.o. Flagyl and Cipro for 8 more days to complete a total of 10 days course.  Also discharged on Metamucil and MiraLAX as needed for constipation.  Has been advised to continue full liquid diet and advance to soft diet after 2 to 3 days with pain as a guide.  Patient has been encouraged to follow-up with PCP in about 1 week.  She also need colonoscopy after 3 to 4 weeks.   Patient was evaluated by  therapy who recommended outpatient follow-up with PT for her left knee.  See individual problem list below for more on hospital course.  Discharge Diagnoses:  Acute sigmoid diverticulitis:-CT abdomen and pelvis showed marked severity of sigmoid diverticulitis without abscess or perforation.  Diverticulitis could have been provoked by opiate related constipation after recent surgery.  She never had colonoscopy.  Pain improved.  Constipation resolved. -Discharged on p.o. Cipro and Flagyl for 8 more days to complete a total of 10 days course -Patient to continue full liquid diet and advance to soft diet as tolerated -Metamucil and MiraLAX as needed for mild and moderate constipation respectively -Needs colonoscopy in 3 to 4 weeks after treatment of diverticulitis.  Normocytic anemia: She had left TKA on 3/15.  She might have some blood loss at that time.  Anemia panel suggests anemia of chronic disease but have to check ferritin level.  She is not sure about melena but denies hematochezia. Recent Labs    05/26/20 0945 07/08/20 0939 07/19/20 0322 07/20/20 0317 08/14/20 1506 08/15/20 0426 08/16/20 0355  HGB 11.9* 12.5 9.8* 9.5* 8.9* 8.2* 8.5*  -Recheck CBC at follow-up. -Needs colonoscopy as well.  Severe hypokalemia/hypomagnesemia:  Resolved.  Hypocalcemia: Likely due to hypomagnesemia.  Resolved.  Controlled NIDDM-2: A1c 4.9% in 05/2020.  Takes Metformin at home.  CBG within fair range. -Continue home Metformin  Essential HTN: Normotensive. -Continue home losartan -Recommended holding HCTZ for 3 more days.  Hyperlipidemia -Continue home statin.  Constipation: Likely due to opiate use after surgery.  Resolved. -Metamucil and  MiraLAX as above  Recent left TKA: Surgical wound appears to be healing nicely. -Outpatient PT   Body mass index is 25.75 kg/m.            Discharge Exam: Vitals:   08/15/20 2132 08/16/20 0437  BP: 119/69 117/75  Pulse: 83 75  Resp:  16 16  Temp: 98.6 F (37 C) 98.3 F (36.8 C)  SpO2: 99% 100%    GENERAL: Sitting in bed and talking to someone over the phone. HEENT: MMM.  Vision and hearing grossly intact.  NECK: Supple.  No apparent JVD.  RESP: On RA.  No IWOB.  Fair aeration bilaterally. CVS:  RRR. Heart sounds normal.  ABD/GI/GU: Bowel sounds present. Soft.  No significant tenderness on exam. MSK/EXT:  Moves extremities. No apparent deformity. No edema.  SKIN: no apparent skin lesion or wound NEURO: Awake, alert and oriented appropriately.  No apparent focal neuro deficit. PSYCH: Calm. Normal affect.   Discharge Instructions  Discharge Instructions    Call MD for:  extreme fatigue   Complete by: As directed    Call MD for:  persistant nausea and vomiting   Complete by: As directed    Call MD for:  severe uncontrolled pain   Complete by: As directed    Call MD for:  temperature >100.4   Complete by: As directed    Diet - low sodium heart healthy   Complete by: As directed    Discharge instructions   Complete by: As directed    It has been a pleasure taking care of you!  You were hospitalized with nausea, vomiting and abdominal pain due to diverticulitis (infection of your colon) and constipation.  You have been started on antibiotic and stool softener.  We are discharging you on more antibiotic to complete treatment course.  We strongly recommend you avoid alcohol while taking these antibiotics.  We also recommend continued use of Metamucil for constipation.  You may use Zofran as needed for nausea.  You may continue full liquid diet for the next 2 to 3 days, and advance to soft diet as tolerated.  Please ask your primary care doctor for referral to gastroenterology for colonoscopy in about 3 to 4 weeks.   We made some adjustments to your medications during this hospitalization.  Please review your new medication list and the directions on your medications before you take them.   Take care,   Increase  activity slowly   Complete by: As directed    No wound care   Complete by: As directed      Allergies as of 08/16/2020      Reactions   Erythromycin Anaphylaxis   Aspirin Nausea And Vomiting   Penicillins Other (See Comments)   Childhood allergy Has patient had a PCN reaction causing immediate rash, facial/tongue/throat swelling, SOB or lightheadedness with hypotension: Unknown Has patient had a PCN reaction causing severe rash involving mucus membranes or skin necrosis: Unknown Has patient had a PCN reaction that required hospitalization:Unknown Has patient had a PCN reaction occurring within the last 10 years:No Tolerated Cephalosporin Date: 07/19/20.      Medication List    STOP taking these medications   HYDROcodone-acetaminophen 5-325 MG tablet Commonly known as: NORCO/VICODIN   ondansetron 4 MG disintegrating tablet Commonly known as: ZOFRAN-ODT   rivaroxaban 10 MG Tabs tablet Commonly known as: XARELTO   sulfamethoxazole-trimethoprim 800-160 MG tablet Commonly known as: BACTRIM DS     TAKE these medications   acetaminophen 500 MG  tablet Commonly known as: TYLENOL Take 1,000 mg by mouth every 6 (six) hours as needed for moderate pain.   blood glucose meter kit and supplies Use as instructed   ciprofloxacin 500 MG tablet Commonly known as: Cipro Take 1 tablet (500 mg total) by mouth 2 (two) times daily for 8 days.   gabapentin 100 MG capsule Commonly known as: NEURONTIN Take a 100 mg capsule three times a day for two weeks following surgery.Then take a 100 mg capsule two times a day for two weeks. Then take a 100 mg capsule once a day for two weeks. Then discontinue. What changed:   how much to take  how to take this  when to take this  additional instructions   hydrochlorothiazide 25 MG tablet Commonly known as: HYDRODIURIL Take 1 tablet (25 mg total) by mouth daily. Start taking on: August 19, 2020 What changed: These instructions start on August 19, 2020. If you are unsure what to do until then, ask your doctor or other care provider.   losartan 100 MG tablet Commonly known as: COZAAR Take 100 mg by mouth daily.   MAALOX MAX PO Take 5-10 mLs by mouth 3 (three) times daily as needed (for acid reflux/heartburn.).   Metamucil 48.57 % Powd Generic drug: Psyllium Dissolve 3.3 gram (1 teaspoonful) in 8 ounce of water and take one to three times a day as needed for mild constipation   metFORMIN 500 MG tablet Commonly known as: GLUCOPHAGE Take 500 mg by mouth 2 (two) times daily as needed (for blood sugar).   methocarbamol 500 MG tablet Commonly known as: ROBAXIN Take 1 tablet (500 mg total) by mouth every 6 (six) hours as needed for muscle spasms.   metroNIDAZOLE 500 MG tablet Commonly known as: Flagyl Take 1 tablet (500 mg total) by mouth 3 (three) times daily for 8 days.   omeprazole 40 MG capsule Commonly known as: PRILOSEC Take 40 mg by mouth daily.   ondansetron 4 MG tablet Commonly known as: Zofran Take 1 tablet (4 mg total) by mouth every 8 (eight) hours as needed for up to 8 days for nausea or vomiting.   polyethylene glycol powder 17 GM/SCOOP powder Commonly known as: MiraLax Take 17 g by mouth 2 (two) times daily as needed for moderate constipation.   simvastatin 40 MG tablet Commonly known as: ZOCOR Take 40 mg by mouth daily.   traMADol 50 MG tablet Commonly known as: ULTRAM Take 1-2 tablets (50-100 mg total) by mouth every 6 (six) hours as needed for moderate pain.   vitamin C 1000 MG tablet Take 1,000 mg by mouth daily.       Consultations:  None  Procedures/Studies:   CT Abdomen Pelvis W Contrast  Result Date: 08/14/2020 CLINICAL DATA:  Diffuse abdominal pain. EXAM: CT ABDOMEN AND PELVIS WITH CONTRAST TECHNIQUE: Multidetector CT imaging of the abdomen and pelvis was performed using the standard protocol following bolus administration of intravenous contrast. CONTRAST:  154mL OMNIPAQUE  IOHEXOL 300 MG/ML  SOLN COMPARISON:  None. FINDINGS: Lower chest: No acute abnormality. Hepatobiliary: Diffuse fatty infiltration of the liver parenchyma is seen. No focal liver abnormality is seen. No gallstones, gallbladder wall thickening, or biliary dilatation. Pancreas: Unremarkable. No pancreatic ductal dilatation or surrounding inflammatory changes. Spleen: Normal in size without focal abnormality. Adrenals/Urinary Tract: Adrenal glands are unremarkable. Kidneys are normal in size, without renal calculi or hydronephrosis. 4.1 cm and 6.3 cm diameter cysts are seen within the right kidney. The urinary bladder is  contracted and subsequently limited in evaluation. Stomach/Bowel: Stomach is within normal limits. Appendix appears normal. No evidence of bowel dilatation. Markedly inflamed diverticula are seen within the proximal sigmoid colon. There is no evidence of associated perforation or abscess. Vascular/Lymphatic: Aortic atherosclerosis. No enlarged abdominal or pelvic lymph nodes. Reproductive: Status post hysterectomy. No adnexal masses. Other: No abdominal wall hernia or abnormality. No abdominopelvic ascites. Musculoskeletal: No acute or significant osseous findings. IMPRESSION: 1. Marked severity sigmoid diverticulitis. 2. Large simple cyst within the right kidney. 3. Aortic atherosclerosis. Aortic Atherosclerosis (ICD10-I70.0). Electronically Signed   By: Virgina Norfolk M.D.   On: 08/14/2020 22:47   DG Abd Portable 1V  Result Date: 08/16/2020 CLINICAL DATA:  Abdominal pain with diverticulitis EXAM: PORTABLE ABDOMEN - 1 VIEW COMPARISON:  CT abdomen and pelvis August 14, 2020 FINDINGS: There is moderate stool in the colon. There is no bowel dilatation or air-fluid level to suggest bowel obstruction. No evident free air. Lung bases clear. Vascular calcification noted in the pelvis. IMPRESSION: Moderate stool in colon. No demonstrable bowel obstruction or free air. Lung bases clear. Electronically  Signed   By: Lowella Grip III M.D.   On: 08/16/2020 11:37   VAS [place] LOWER EXTREMITY VENOUS (DVT)  Result Date: 07/26/2020  Lower Venous DVT Study Indications: Patient presents with left lower extremity pain and swelling s/p knee replacement surgery 1 week ago.  Risk Factors: Surgery s/p left total knee arthroplasty on 07/18/20. Anticoagulation: Xarelto. Limitations: Poor ultrasound/tissue interface. Performing Technologist: Mariane Masters RVT  Examination Guidelines: A complete evaluation includes B-mode imaging, spectral Doppler, color Doppler, and power Doppler as needed of all accessible portions of each vessel. Bilateral testing is considered an integral part of a complete examination. Limited examinations for reoccurring indications may be performed as noted. The reflux portion of the exam is performed with the patient in reverse Trendelenburg.  +-----+---------------+---------+-----------+----------+--------------+ RIGHTCompressibilityPhasicitySpontaneityPropertiesThrombus Aging +-----+---------------+---------+-----------+----------+--------------+ CFV  Full           Yes      Yes                                 +-----+---------------+---------+-----------+----------+--------------+   +---------+---------------+---------+-----------+----------+-------------------+ LEFT     CompressibilityPhasicitySpontaneityPropertiesThrombus Aging      +---------+---------------+---------+-----------+----------+-------------------+ CFV      Full           Yes      Yes                                      +---------+---------------+---------+-----------+----------+-------------------+ SFJ      Full           Yes      Yes                                      +---------+---------------+---------+-----------+----------+-------------------+ FV Prox  Full           Yes      Yes                                       +---------+---------------+---------+-----------+----------+-------------------+ FV Mid   Full           Yes      Yes                                      +---------+---------------+---------+-----------+----------+-------------------+  FV DistalFull           Yes      Yes                                      +---------+---------------+---------+-----------+----------+-------------------+ PFV      Full                                                             +---------+---------------+---------+-----------+----------+-------------------+ POP      Full           Yes      Yes                                      +---------+---------------+---------+-----------+----------+-------------------+ PTV      Full           Yes      Yes                                      +---------+---------------+---------+-----------+----------+-------------------+ PERO                                                  Not well visualized +---------+---------------+---------+-----------+----------+-------------------+ Gastroc  Full                                                             +---------+---------------+---------+-----------+----------+-------------------+ GSV      Full           Yes      Yes                                      +---------+---------------+---------+-----------+----------+-------------------+ Edema noted in the left calf. Peroneal veins not able to be visualized.   Findings reported to Tammy at [facility] at 2:30 pm.  Summary: RIGHT: - No evidence of common femoral vein obstruction.  LEFT: - No evidence of deep vein thrombosis in the lower extremity. No indirect evidence of obstruction proximal to the inguinal ligament. - No cystic structure found in the popliteal fossa.  *See table(s) above for measurements and observations. Electronically signed by Kathlyn Sacramento MD on 07/26/2020 at 5:02:37 PM.    Final        The results of significant  diagnostics from this hospitalization (including imaging, microbiology, ancillary and laboratory) are listed below for reference.     Microbiology: Recent Results (from the past 240 hour(s))  Urine culture     Status: Abnormal   Collection Time: 08/14/20  4:45 PM   Specimen: Urine, Random  Result Value Ref Range Status   Specimen Description   Final    URINE, RANDOM Performed  at [facility], [place] [address], [place], [place] [address]    Special Requests   Final    NONE Performed at [facility], [place] [address], [place], [place] [address]    Culture (A)  Final    <10,000 COLONIES/mL INSIGNIFICANT GROWTH Performed at [facility] [address] [place], [place] [address]    Report Status 08/16/2020 FINAL  Final  SARS CORONAVIRUS 2 (TAT 6-24 HRS) Nasopharyngeal Nasopharyngeal Swab     Status: None   Collection Time: 08/14/20 11:14 PM   Specimen: Nasopharyngeal Swab  Result Value Ref Range Status   SARS Coronavirus 2 NEGATIVE NEGATIVE Final    Comment: (NOTE) SARS-CoV-2 target nucleic acids are NOT DETECTED.  The SARS-CoV-2 RNA is generally detectable in upper and lower respiratory specimens during the acute phase of infection. Negative results do not preclude SARS-CoV-2 infection, do not rule out co-infections with other pathogens, and should not be used as the sole basis for treatment or other patient management decisions. Negative results must be combined with clinical observations, patient history, and epidemiological information. The expected result is Negative.  Fact Sheet for Patients: SugarRoll.be  Fact Sheet for Healthcare Providers: https://www.woods-mathews.com/  This test is not yet approved or cleared by the [place] FDA and  has been authorized for detection and/or diagnosis of SARS-CoV-2 by FDA under an Emergency Use Authorization (EUA). This EUA will remain  in  effect (meaning this test can be used) for the duration of the COVID-19 declaration under Se ction 564(b)(1) of the Act, 21 U.S.C. section 360bbb-3(b)(1), unless the authorization is terminated or revoked sooner.  Performed at [facility], [place] [address], [place], [place] [address]      Labs:  CBC: Recent Labs  Lab 08/14/20 1506 08/15/20 0426 08/16/20 0355  WBC 6.9 7.1 6.1  NEUTROABS 4.1  --   --   HGB 8.9* 8.2* 8.5*  HCT 27.5* 25.8* 26.4*  MCV 91.4 92.1 92.6  PLT 194 164 183   BMP &GFR Recent Labs  Lab 08/14/20 1506 08/14/20 1606 08/15/20 0426 08/16/20 0355  NA 140  --  137 136  K 2.2*  --  2.9* 3.6  CL 100  --  102 103  CO2 28  --  25 25  GLUCOSE 100*  --  111* 106*  BUN 14  --  10 7*  CREATININE 0.89  --  0.77 0.74  CALCIUM 8.2*  --  7.9* 8.7*  MG  --  1.0* 1.6* 2.2  PHOS  --   --   --  3.0   Estimated Creatinine Clearance: 62.9 mL/min (by C-G formula based on SCr of 0.74 mg/dL). Liver & Pancreas: Recent Labs  Lab 08/14/20 1506 08/16/20 0355  AST 10*  --   ALT 10  --   ALKPHOS 71  --   BILITOT 0.7  --   PROT 7.4  --   ALBUMIN 4.0 3.3*   Recent Labs  Lab 08/14/20 1506  LIPASE 28   No results for input(s): AMMONIA in the last 168 hours. Diabetic: No results for input(s): HGBA1C in the last 72 hours. No results for input(s): GLUCAP in the last 168 hours. Cardiac Enzymes: No results for input(s): CKTOTAL, CKMB, CKMBINDEX, TROPONINI in the last 168 hours. No results for input(s): PROBNP in the last 8760 hours. Coagulation Profile: No results for input(s): INR, PROTIME in the last 168 hours. Thyroid Function Tests: No results for input(s): TSH, T4TOTAL, FREET4,  T3FREE, THYROIDAB in the last 72 hours. Lipid Profile: No results for input(s): CHOL, HDL, LDLCALC, TRIG, CHOLHDL, LDLDIRECT in the last 72 hours. Anemia Panel: Recent Labs    08/15/20 0426  VITAMINB12 661  FOLATE 10.4  FERRITIN 271  TIBC 239*  IRON 34   Urine analysis:     Component Value Date/Time   COLORURINE YELLOW 08/14/2020 [place] 08/14/2020 1645   LABSPEC 1.023 08/14/2020 1645   PHURINE 8.0 08/14/2020 1645   GLUCOSEU NEGATIVE 08/14/2020 1645   HGBUR NEGATIVE 08/14/2020 1645   BILIRUBINUR NEGATIVE 08/14/2020 1645   KETONESUR 5 (A) 08/14/2020 1645   PROTEINUR 30 (A) 08/14/2020 1645   UROBILINOGEN 0.2 05/21/2013 1145   NITRITE NEGATIVE 08/14/2020 1645   LEUKOCYTESUR NEGATIVE 08/14/2020 1645   Sepsis Labs: Invalid input(s): PROCALCITONIN, LACTICIDVEN   Time coordinating discharge: 40 minutes  SIGNED:  Mercy Riding, MD  [facility] 08/16/2020, 1:33 PM  If 7PM-7AM, please contact night-coverage [email or web address]

## 2020-08-16 NOTE — Evaluation (Signed)
Occupational Therapy Evaluation Patient Details Name: Connie Branch MRN: [id number] DOB: [birth year] Today's Date: 08/16/2020    History of Present Illness 70 year old female admitted for acute diverticulitis without perforation or abscess, severe hypokalemia and hypomagnesemia.  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 Patient reports mod I at home with walker since knee replacement surgery. Patient demonstrates lower body dressing, toilet transfer and sink side grooming and hygiene without any assistance. No further acute OT needs at this time, will sign off. Please re-consult if new needs arise.      Follow Up Recommendations  No OT follow up    Equipment Recommendations  None recommended by OT       Precautions / Restrictions Precautions Precautions: Knee Restrictions Weight Bearing Restrictions: No LLE Weight Bearing: Weight bearing as tolerated      Mobility Bed Mobility Overal bed mobility: Modified Independent             General bed mobility comments: HOB elevated    Transfers Overall transfer level: Modified independent Equipment used: Rolling walker (2 wheeled)                  Balance Overall balance assessment: Modified Independent                                         ADL either performed or assessed with clinical judgement   ADL Overall ADL's : Modified independent;At baseline                                       General ADL Comments: patient is able to perform toilet transfer, sink side grooming/hygiene and sponge bathing as well as pulling up socks seated EOB without any physical assistance                  Pertinent Vitals/Pain Pain Assessment: Faces Faces Pain Scale: Hurts little more Pain Location: L  knee Pain Descriptors / Indicators: Discomfort;Sore Pain Intervention(s): Monitored during session     Hand Dominance Right   Extremity/Trunk Assessment Upper Extremity Assessment Upper Extremity Assessment: Overall WFL for tasks assessed   Lower Extremity Assessment Lower Extremity Assessment: Defer to PT evaluation       Communication Communication Communication: No difficulties   Cognition Arousal/Alertness: Awake/alert Behavior During Therapy: WFL for tasks assessed/performed Overall Cognitive Status: Within Functional Limits for tasks assessed                                                Home Living Family/patient expects to be discharged to:: Private residence Living Arrangements: Other (Comment) (nephew) Available Help at Discharge: Available 24 hours/day Type of Home: House Home Access: Stairs to enter [employer] of Steps: 1 curb Entrance Stairs-Rails: None Home Layout: Two level;Bed/bath upstairs;1/2 bath on main level Alternate Level Stairs-Number of Steps: 13 Alternate Level Stairs-Rails: Right;Left;Can reach both Bathroom Shower/Tub: Tub/shower unit;Walk-in shower   Bathroom Toilet: Handicapped height     Home Equipment: Emergency planning/management officer -  2 wheels;Cane - single point;Grab bars - tub/shower;Grab bars - toilet;Other (comment) (reports needs new cane)   Additional Comments: Had BSC but did not like so she got rid of it and got elevated toilet and rails      Prior Functioning/Environment Level of Independence: Needs assistance  Gait / Transfers Assistance Needed: using RW s/p Left TKA ADL's / Homemaking Assistance Needed: reports independent with self care tasks   Comments: was going to [facility] for TKA rehab                 OT Goals(Current goals can be found in the care plan section) Acute Rehab OT Goals Patient Stated Goal: home soon OT Goal Formulation: All assessment and education complete, DC therapy   AM-PAC  OT "6 Clicks" Daily Activity     Outcome Measure Help from another person eating meals?: None Help from another person taking care of personal grooming?: None Help from another person toileting, which includes using toliet, bedpan, or urinal?: None Help from another person bathing (including washing, rinsing, drying)?: None Help from another person to put on and taking off regular upper body clothing?: None Help from another person to put on and taking off regular lower body clothing?: None 6 Click Score: 24   End of Session Equipment Utilized During Treatment: Rolling walker Nurse Communication: Mobility status  Activity Tolerance: Patient tolerated treatment well Patient left: in bed;with call bell/phone within reach  OT Visit Diagnosis: Pain Pain - Right/Left: Left Pain - part of body: Knee                Time: 7622-6333 OT Time Calculation (min): 19 min Charges:  OT General Charges $OT Visit: 1 Visit OT Evaluation $OT Eval Low Complexity: 1 Low  Marlyce Huge OT OT pager: [phone]  Carmelia Roller 08/16/2020, 9:55 AM

## 2020-08-20 ENCOUNTER — Other Ambulatory Visit: Payer: Self-pay

## 2020-08-20 ENCOUNTER — Emergency Department (HOSPITAL_COMMUNITY)
Admission: EM | Admit: 2020-08-20 | Discharge: 2020-08-20 | Disposition: A | Discharge status: Home / Self Care | Payer: Medicare HMO | Attending: Emergency Medicine | Admitting: Emergency Medicine

## 2020-08-20 ENCOUNTER — Encounter (HOSPITAL_COMMUNITY): Payer: Self-pay

## 2020-08-20 DIAGNOSIS — R000 Tachycardia, unspecified: Secondary | ICD-10-CM | POA: Diagnosis not present

## 2020-08-20 DIAGNOSIS — Z7984 Long term (current) use of oral hypoglycemic drugs: Secondary | ICD-10-CM | POA: Insufficient documentation

## 2020-08-20 DIAGNOSIS — E876 Hypokalemia: Secondary | ICD-10-CM | POA: Diagnosis not present

## 2020-08-20 DIAGNOSIS — R112 Nausea with vomiting, unspecified: Secondary | ICD-10-CM | POA: Diagnosis present

## 2020-08-20 DIAGNOSIS — Z87891 Personal history of nicotine dependence: Secondary | ICD-10-CM | POA: Insufficient documentation

## 2020-08-20 DIAGNOSIS — R198 Other specified symptoms and signs involving the digestive system and abdomen: Secondary | ICD-10-CM | POA: Diagnosis not present

## 2020-08-20 DIAGNOSIS — Z96653 Presence of artificial knee joint, bilateral: Secondary | ICD-10-CM | POA: Insufficient documentation

## 2020-08-20 DIAGNOSIS — E1110 Type 2 diabetes mellitus with ketoacidosis without coma: Secondary | ICD-10-CM | POA: Insufficient documentation

## 2020-08-20 DIAGNOSIS — Z79899 Other long term (current) drug therapy: Secondary | ICD-10-CM | POA: Insufficient documentation

## 2020-08-20 DIAGNOSIS — I10 Essential (primary) hypertension: Secondary | ICD-10-CM | POA: Diagnosis not present

## 2020-08-20 LAB — COMPREHENSIVE METABOLIC PANEL
ALT: 11 U/L (ref 0–44)
AST: 16 U/L (ref 15–41)
Albumin: 4.5 g/dL (ref 3.5–5.0)
Alkaline Phosphatase: 78 U/L (ref 38–126)
Anion gap: 11 (ref 5–15)
BUN: 10 mg/dL (ref 8–23)
CO2: 22 mmol/L (ref 22–32)
Calcium: 9.6 mg/dL (ref 8.9–10.3)
Chloride: 103 mmol/L (ref 98–111)
Creatinine, Ser: 1.11 mg/dL — ABNORMAL HIGH (ref 0.44–1.00)
GFR, Estimated: 54 mL/min — ABNORMAL LOW (ref >60)
Glucose, Bld: 125 mg/dL — ABNORMAL HIGH (ref 70–99)
Potassium: 3 mmol/L — ABNORMAL LOW (ref 3.5–5.1)
Sodium: 136 mmol/L (ref 135–145)
Total Bilirubin: 0.7 mg/dL (ref 0.3–1.2)
Total Protein: 8.1 g/dL (ref 6.5–8.1)

## 2020-08-20 LAB — CBC WITH DIFFERENTIAL/PLATELET
Abs Immature Granulocytes: 0.05 10*3/uL (ref 0.00–0.07)
Basophils Absolute: 0.1 10*3/uL (ref 0.0–0.1)
Basophils Relative: 1 %
Eosinophils Absolute: 0 10*3/uL (ref 0.0–0.5)
Eosinophils Relative: 1 %
HCT: 32.6 % — ABNORMAL LOW (ref 36.0–46.0)
Hemoglobin: 10.5 g/dL — ABNORMAL LOW (ref 12.0–15.0)
Immature Granulocytes: 1 %
Lymphocytes Relative: 29 %
Lymphs Abs: 2.1 10*3/uL (ref 0.7–4.0)
MCH: 29.2 pg (ref 26.0–34.0)
MCHC: 32.2 g/dL (ref 30.0–36.0)
MCV: 90.6 fL (ref 80.0–100.0)
Monocytes Absolute: 0.7 10*3/uL (ref 0.1–1.0)
Monocytes Relative: 10 %
Neutro Abs: 4.2 10*3/uL (ref 1.7–7.7)
Neutrophils Relative %: 58 %
Platelets: 270 10*3/uL (ref 150–400)
RBC: 3.6 MIL/uL — ABNORMAL LOW (ref 3.87–5.11)
RDW: 15.8 % — ABNORMAL HIGH (ref 11.5–15.5)
WBC: 7.2 10*3/uL (ref 4.0–10.5)
nRBC: 0 % (ref 0.0–0.2)

## 2020-08-20 LAB — URINALYSIS, ROUTINE W REFLEX MICROSCOPIC
Bilirubin Urine: NEGATIVE
Glucose, UA: NEGATIVE mg/dL
Hgb urine dipstick: NEGATIVE
Ketones, ur: NEGATIVE mg/dL
Nitrite: NEGATIVE
Protein, ur: NEGATIVE mg/dL
Specific Gravity, Urine: 1.011 (ref 1.005–1.030)
pH: 8 (ref 5.0–8.0)

## 2020-08-20 LAB — LIPASE, BLOOD: Lipase: 29 U/L (ref 11–51)

## 2020-08-20 MED ORDER — SODIUM CHLORIDE 0.9 % IV BOLUS
1000.0000 mL | Freq: Once | INTRAVENOUS | Status: AC
  Administered 2020-08-20: 1000 mL via INTRAVENOUS

## 2020-08-20 MED ORDER — FAMOTIDINE IN NACL 20-0.9 MG/50ML-% IV SOLN
20.0000 mg | Freq: Once | INTRAVENOUS | Status: AC
  Administered 2020-08-20: 20 mg via INTRAVENOUS
  Filled 2020-08-20: qty 50

## 2020-08-20 MED ORDER — ONDANSETRON HCL 4 MG/2ML IJ SOLN
4.0000 mg | Freq: Once | INTRAMUSCULAR | Status: AC
  Administered 2020-08-20: 4 mg via INTRAVENOUS
  Filled 2020-08-20: qty 2

## 2020-08-20 MED ORDER — ONDANSETRON 4 MG PO TBDP
4.0000 mg | ORAL_TABLET | Freq: Once | ORAL | Status: AC
  Administered 2020-08-20: 4 mg via ORAL
  Filled 2020-08-20: qty 1

## 2020-08-20 MED ORDER — POTASSIUM CHLORIDE CRYS ER 20 MEQ PO TBCR
20.0000 meq | EXTENDED_RELEASE_TABLET | Freq: Once | ORAL | Status: AC
  Administered 2020-08-20: 20 meq via ORAL
  Filled 2020-08-20: qty 1

## 2020-08-20 NOTE — ED Provider Notes (Signed)
Connie Branch   CSN: [id number] Arrival date & time: 08/20/20  1212     History Chief Complaint  Patient presents with  . Emesis  . Nausea    Connie Branch is a 70 y.o. female.  She had recent knee replacement and after that was admitted to the hospital for acute diverticulitis.  She had some metabolic derangements.  She is complaining of having an upset stomach from the metronidazole she was discharged with and has had nausea and vomiting more severe since 3 AM today.  She did not take the metronidazole today.  Still taking the Cipro.  No blood in the vomitus no fever.  Feels abdominal bloating.  The history is provided by the patient.  Emesis Severity:  Moderate Timing:  Intermittent Quality:  Stomach contents Progression:  Unchanged Chronicity:  New Recent urination:  Normal Relieved by:  Nothing Worsened by:  Nothing Ineffective treatments:  Antiemetics Associated symptoms: no abdominal pain, no diarrhea, no fever, no headaches and no sore throat   Risk factors: diabetes        Past Medical History:  Diagnosis Date  . Anxiety    panic attacks  . ARF (acute renal failure) secondary to dehydration from osmotic diuresis 05/21/2013   KIDNEY FUNCTION NORMAL NOW  . Arthritis   . Cataracts, bilateral   . DDD (degenerative disc disease)   . 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 GERD (gastroesophageal reflux disease)   . Headache    SINUS  . High cholesterol   . History of claustrophobia   . Hypertension   . Neuropathy 2015   feet  . Obesity   . Osteopenia     Patient Active Problem List   Diagnosis Date Noted  . Acute diverticulitis 08/15/2020  . Hypomagnesemia 08/15/2020  . Hypocalcemia 08/15/2020  . Type 2 diabetes mellitus with hyperlipidemia ([place]) 08/15/2020  . Anemia 08/15/2020  . 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 Postoperative anemia due to acute blood loss 06/24/2017   . Primary osteoarthritis of right knee 06/21/2017  . Primary osteoarthritis of left knee 06/21/2017  . DM (diabetes mellitus), secondary, uncontrolled, with ketoacidosis ([place]) 05/23/2013  . Hypokalemia 05/22/2013  . Obesity (BMI 30-39.9) 05/22/2013  . DKA (diabetic ketoacidoses) 05/21/2013  . Muscle spasm 05/21/2013  . Hyponatremia 05/21/2013  . ARF (acute renal failure) secondary to dehydration from osmotic diuresis 05/21/2013  . HTN (hypertension) 05/21/2013  . Hyperlipidemia 05/21/2013  . GERD (gastroesophageal reflux disease) 05/21/2013  . Infection due to trichomonas 05/21/2013    Past Surgical History:  Procedure Laterality Date  . ABDOMINAL HYSTERECTOMY     COMPLETE  . BREAST SURGERY Left 04/20/2020   BX  . KNEE JOINT MANIPULATION Right   . OVARIAN CYST AND OVARY REMOVED  YRS AGO  . TOTAL KNEE ARTHROPLASTY Right 06/21/2017   Procedure: RIGHT TOTAL KNEE ARTHROPLASTY;  Surgeon: Dorna Leitz, MD;  Location: [facility];  Service: Orthopedics;  Laterality: Right;  . TOTAL KNEE ARTHROPLASTY Left 07/18/2020   Procedure: TOTAL KNEE ARTHROPLASTY;  Surgeon: Gaynelle Arabian, MD;  Location: [facility];  Service: Orthopedics;  Laterality: Left;  26mn  . TOTAL KNEE REVISION Right 07/22/2019   Procedure: Right knee polyethylene exchange;  Surgeon: AGaynelle Arabian MD;  Location: [facility];  Service: Orthopedics;  Laterality: Right;  1251m     OB History   No obstetric history on file.     Family History  Problem Relation Age of Onset  .  Breast cancer Neg Hx     Social History   Tobacco Use  . Smoking status: Former Smoker    Packs/day: 0.25    Years: 50.00    Pack years: 12.50    Types: Cigarettes    Quit date: 05/07/2013    Years since quitting: 7.2  . Smokeless tobacco: Never Used  Vaping Use  . Vaping Use: Never used  Substance Use Topics  . Alcohol use: Not Currently    Comment: rare  . Drug use: No    Home Medications Prior to Admission medications   Medication Sig Start  Date End Date Taking? Authorizing Provider  acetaminophen (TYLENOL) 500 MG tablet Take 1,000 mg by mouth every 6 (six) hours as needed for moderate pain.    [provider]  Ascorbic Acid (VITAMIN C) 1000 MG tablet Take 1,000 mg by mouth daily.    [provider]  Blood Glucose Monitoring Suppl (BLOOD GLUCOSE METER) kit Use as instructed 05/24/13   Rama, Venetia Maxon, MD  Calcium Carbonate-Simethicone (MAALOX MAX PO) Take 5-10 mLs by mouth 3 (three) times daily as needed (for acid reflux/heartburn.).     [provider]  ciprofloxacin (CIPRO) 500 MG tablet Take 1 tablet (500 mg total) by mouth 2 (two) times daily for 8 days. 08/16/20 08/24/20  Mercy Riding, MD  gabapentin (NEURONTIN) 100 MG capsule Take a 100 mg capsule three times a day for two weeks following surgery.Then take a 100 mg capsule two times a day for two weeks. Then take a 100 mg capsule once a day for two weeks. Then discontinue. Patient taking differently: Take 100 mg by mouth 2 (two) times daily. 07/19/20   Edmisten, Kristie L, PA  hydrochlorothiazide (HYDRODIURIL) 25 MG tablet Take 1 tablet (25 mg total) by mouth daily. 08/19/20   Mercy Riding, MD  losartan (COZAAR) 100 MG tablet Take 100 mg by mouth daily. 07/03/19   [provider]  metFORMIN (GLUCOPHAGE) 500 MG tablet Take 500 mg by mouth 2 (two) times daily as needed (for blood sugar). 04/26/17   [provider]  methocarbamol (ROBAXIN) 500 MG tablet Take 1 tablet (500 mg total) by mouth every 6 (six) hours as needed for muscle spasms. 07/19/20   Edmisten, Ok Anis, PA  metroNIDAZOLE (FLAGYL) 500 MG tablet Take 1 tablet (500 mg total) by mouth 3 (three) times daily for 8 days. 08/16/20 08/24/20  Mercy Riding, MD  omeprazole (PRILOSEC) 40 MG capsule Take 40 mg by mouth daily. 05/29/17   [provider]  ondansetron (ZOFRAN) 4 MG tablet Take 1 tablet (4 mg total) by mouth every 8 (eight) hours as needed for up to 8 days for nausea or  vomiting. 08/16/20 08/24/20  Mercy Riding, MD  polyethylene glycol powder (MIRALAX) 17 GM/SCOOP powder Take 17 g by mouth 2 (two) times daily as needed for moderate constipation. 08/16/20   Mercy Riding, MD  Psyllium (METAMUCIL) 48.57 % POWD Dissolve 3.3 gram (1 teaspoonful) in 8 ounce of water and take one to three times a day as needed for mild constipation 08/16/20   Mercy Riding, MD  simvastatin (ZOCOR) 40 MG tablet Take 40 mg by mouth daily. 05/25/17   [provider]  traMADol (ULTRAM) 50 MG tablet Take 1-2 tablets (50-100 mg total) by mouth every 6 (six) hours as needed for moderate pain. 07/19/20   Edmisten, Kristie L, PA    Allergies    Erythromycin, Aspirin, and Penicillins  Review  of Systems   Review of Systems  Constitutional: Negative for fever.  HENT: Negative for sore throat.   Eyes: Negative for visual disturbance.  Respiratory: Negative for shortness of breath.   Cardiovascular: Negative for chest pain.  Gastrointestinal: Positive for abdominal distention, nausea and vomiting. Negative for abdominal pain and diarrhea.  Genitourinary: Negative for dysuria.  Musculoskeletal: Negative for neck pain.  Skin: Negative for rash.  Neurological: Negative for headaches.    Physical Exam Updated Vital Signs BP 127/79   Pulse (!) 105   Temp 97.9 F (36.6 C) (Oral)   Resp 18   Ht _0  (1.626 m)   Wt 68 kg   SpO2 99%   BMI 25.75 kg/m   Physical Exam Vitals and nursing Branch reviewed.  Constitutional:      General: She is not in acute distress.    Appearance: Normal appearance. She is well-developed.  HENT:     Head: Normocephalic and atraumatic.  Eyes:     Conjunctiva/sclera: Conjunctivae normal.  Cardiovascular:     Rate and Rhythm: Regular rhythm. Tachycardia present.     Heart sounds: No murmur heard.   Pulmonary:     Effort: Pulmonary effort is normal. No respiratory distress.     Breath sounds: Normal breath sounds.  Abdominal:     Palpations:  Abdomen is soft.     Tenderness: There is no abdominal tenderness.  Musculoskeletal:        General: Tenderness (left knee) present.     Cervical back: Neck supple.  Skin:    General: Skin is warm and dry.  Neurological:     General: No focal deficit present.     Mental Status: She is alert.     ED Results / Procedures / Treatments   Labs (all labs ordered are listed, but only abnormal results are displayed) Labs Reviewed  CBC WITH DIFFERENTIAL/PLATELET - Abnormal; Notable for the following components:      Result Value   RBC 3.60 (*)    Hemoglobin 10.5 (*)    HCT 32.6 (*)    RDW 15.8 (*)    All other components within normal limits  COMPREHENSIVE METABOLIC PANEL - Abnormal; Notable for the following components:   Potassium 3.0 (*)    Glucose, Bld 125 (*)    Creatinine, Ser 1.11 (*)    GFR, Estimated 54 (*)    All other components within normal limits  URINALYSIS, ROUTINE W REFLEX MICROSCOPIC - Abnormal; Notable for the following components:   Leukocytes,Ua TRACE (*)    Bacteria, UA RARE (*)    All other components within normal limits  LIPASE, BLOOD    EKG None  Radiology No results found.  Procedures Procedures   Medications Ordered in ED Medications  ondansetron (ZOFRAN-ODT) disintegrating tablet 4 mg (has no administration in time range)  ondansetron (ZOFRAN) injection 4 mg (has no administration in time range)  sodium chloride 0.9 % bolus 1,000 mL (has no administration in time range)  famotidine (PEPCID) IVPB 20 mg premix (has no administration in time range)    ED Course  I have reviewed the triage vital signs and the nursing notes.  Pertinent labs & imaging results that were available during my care of the patient were reviewed by me and considered in my medical decision making (see chart for details).  Clinical Course as of 08/21/20 0932  Sat Aug 20, 2020  1638 Patient feels better after the fluids and medication.  Her potassium is low and  has  been an issue in the past.  She is willing to try some oral potassium.  It seems like the metronidazole is giving her a lot of problems she has 2 days left.  Risk benefits to continue it versus just finishing up the Cipro.  She is not having any abdominal pain now so I think she may be better off just holding the metronidazole and monitoring her symptoms. [MB]    Clinical Course User Index [MB] Hayden Rasmussen, MD   MDM Rules/Calculators/A&P                         This patient complains of nausea and vomiting upset stomach; this involves an extensive number of treatment Options and is a complaint that carries with it a high risk of complications and Morbidity. The differential includes gastritis, peptic ulcer disease, medication intolerance, dehydration, renal failure, metabolic derangement  I ordered, reviewed and interpreted labs, which included CBC with normal white count, hemoglobin low but stable from priors, chemistries with low potassium LFTs, urinalysis unremarkable I ordered medication IV fluids, IV nausea medication, oral testing Previous records obtained and reviewed in epic including recent admission for diverticulitis and hypokalemia  After the interventions stated above, I reevaluated the patient and found patient symptoms to be improved.  She is comfortable plan for close follow-up with her primary care doctor.  Return instructions discussed   Final Clinical Impression(s) / ED Diagnoses Final diagnoses:  Non-intractable vomiting with nausea, unspecified vomiting type  Hypokalemia    Rx / DC Orders ED Discharge Orders    None       Hayden Rasmussen, MD 08/21/20 [phone]

## 2020-08-20 NOTE — ED Triage Notes (Addendum)
Emergency Medicine Provider Triage Evaluation Note  Connie Branch , a 70 y.o. female  was evaluated in triage.  Pt complains of nausea and vomiting.  Nausea vomiting has been intermittent since being discharged from the hospital on 4/12 where she was admitted for diverticulitis.  Patient's nausea has worsened today.  Patient reports she is unable to keep anything down by mouth.  Patient denies any bloody emesis or coffee-ground emesis  Review of Systems  Positive: Nausea, vomiting, chills, fatigue Negative: Abdominal pain  Physical Exam  BP 127/79 (BP Location: Left Arm)   Pulse (!) 105   Temp 97.9 F (36.6 C) (Oral)   Resp 18   SpO2 99%  Gen:   Awake, no distress   HEENT:  Atraumatic  Resp:  Normal effort  Cardiac:  Tachy at rate of 105 Abd:   Nondistended, nontender  MSK:   Moves extremities without difficulty  Neuro:  Speech clear   Medical Decision Making  Medically screening exam initiated at 1:29 PM.  Appropriate orders placed.  Meghan Warshawsky was informed that the remainder of the evaluation will be completed by another provider, this initial triage assessment does not replace that evaluation, and the importance of remaining in the ED until their evaluation is complete.  Clinical Impression  The patient appears stable so that the remainder of the work up may be completed by another provider.      Haskel Schroeder, PA-C 08/20/20 1330    Haskel Schroeder, PA-C 08/20/20 1331

## 2020-08-20 NOTE — ED Triage Notes (Signed)
Patient reports vomiting from taking antibiotics after recent admission for diverticulitis.

## 2020-08-20 NOTE — Discharge Instructions (Signed)
You were seen in the emergency department for nausea and vomiting.  You had lab work that showed your potassium was low.  Your symptoms improved with some IV fluids.  Your nausea may be related to one of the antibiotics.  It may be reasonable to stop the metronidazole and finish just with the ciprofloxacin.  Please contact your primary care doctor for close follow-up.  Return to the emergency department if any worsening or concerning symptoms.

## 2020-09-13 ENCOUNTER — Other Ambulatory Visit: Payer: Self-pay

## 2020-09-13 ENCOUNTER — Encounter: Payer: Self-pay | Admitting: Hematology and Oncology

## 2020-09-13 ENCOUNTER — Inpatient Hospital Stay: Payer: Medicare HMO | Attending: Hematology and Oncology | Admitting: Hematology and Oncology

## 2020-09-13 VITALS — BP 140/72 | HR 82 | Temp 97.9°F | Resp 18 | Ht 64.0 in | Wt 151.8 lb

## 2020-09-13 DIAGNOSIS — Z96652 Presence of left artificial knee joint: Secondary | ICD-10-CM | POA: Diagnosis not present

## 2020-09-13 DIAGNOSIS — Z9189 Other specified personal risk factors, not elsewhere classified: Secondary | ICD-10-CM | POA: Diagnosis not present

## 2020-09-13 DIAGNOSIS — Z87891 Personal history of nicotine dependence: Secondary | ICD-10-CM | POA: Diagnosis not present

## 2020-09-13 DIAGNOSIS — N6092 Unspecified benign mammary dysplasia of left breast: Secondary | ICD-10-CM | POA: Diagnosis not present

## 2020-09-13 NOTE — Progress Notes (Addendum)
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  During last visit we discussed about using tamoxifen or arimidex for prevention of breast cancer given ALH. She wanted to read about the medication. She read the material we gave her, She doesn't want to try antiestrogen therapy. She understands ALH increases her risk of breast cancer but she feels comfortable because she is going to get surgery. She agrees to surveillance visits every 6 months and mammo annually. Mammogram ordered. She will RTC in 6 months.  Orders Placed This Encounter  Procedures  . MM Digital Diagnostic Bilat    Standing Status:   Future    Standing Expiration Date:   09/13/2021    Order Specific Question:   Reason for Exam (SYMPTOM  OR DIAGNOSIS REQUIRED)    Answer:   [facility] left breast.    Order Specific Question:   Preferred imaging location?    [facility]     HISTORY OF PRESENTING ILLNESS:   Connie Branch 70 y.o. female is here because of atypical lobular hyperplasia.  Oncology History   No history exists.    Connie Branch is a very pleasant 70 year old female patient with past medical history significant for diabetes, peripheral neuropathy, osteoarthritis, hypertension referred to high-risk breast cancer clinic given her recent biopsy of left breast which demonstrated atypical lobular hyperplasia. During her last visit she became very anxious when we  discussed anti estrogen therapy.  INTERIM HISTORY  Since last visit, she has had left knee replacement. She has been going through PT and is dealing with lot of pain issues. She had to go to the ER and had an episode of diverticulitis, says this was from pain medication. She says she read about the medication for [facility], she doesn't want to take it. She will have it removed by breast surgery. She is ok for surveillance visits Rest of the pertinent 10 point ROS reviewed and negative  REVIEW OF SYSTEMS:    Constitutional: Denies fevers, chills or abnormal night sweats Eyes: Denies blurriness of vision, double vision or watery eyes Ears, nose, mouth, throat, and face: Denies mucositis or sore throat Respiratory: Denies cough, dyspnea or wheezes Cardiovascular: Denies palpitation, chest discomfort or lower extremity swelling Gastrointestinal:  Denies nausea, heartburn or change in bowel habits Skin: Denies abnormal skin rashes Lymphatics: Denies new lymphadenopathy or easy bruising Neurological:Denies numbness, tingling or new weaknesses Behavioral/Psych: Mood is stable, no new changes  MSK: left leg pain from knee replacement. All other systems were reviewed with the patient and are negative.   MEDICAL HISTORY:  Past Medical History:  Diagnosis Date  . Anxiety    panic attacks  . ARF (acute renal failure) secondary to dehydration from osmotic diuresis 05/21/2013   KIDNEY FUNCTION NORMAL NOW  . Arthritis   . Cataracts, bilateral   . DDD (degenerative disc disease)   . 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 GERD (gastroesophageal reflux disease)   . Headache  SINUS  . High cholesterol   . History of claustrophobia   . Hypertension   . Neuropathy 2015   feet  . Obesity   . Osteopenia     SURGICAL HISTORY: Past Surgical History:  Procedure Laterality Date  . ABDOMINAL HYSTERECTOMY     COMPLETE  . BREAST SURGERY Left 04/20/2020   BX  . KNEE JOINT  MANIPULATION Right   . OVARIAN CYST AND OVARY REMOVED  YRS AGO  . TOTAL KNEE ARTHROPLASTY Right 06/21/2017   Procedure: RIGHT TOTAL KNEE ARTHROPLASTY;  Surgeon: Dorna Leitz, MD;  Location: [facility];  Service: Orthopedics;  Laterality: Right;  . TOTAL KNEE ARTHROPLASTY Left 07/18/2020   Procedure: TOTAL KNEE ARTHROPLASTY;  Surgeon: Gaynelle Arabian, MD;  Location: [facility];  Service: Orthopedics;  Laterality: Left;  65min  . TOTAL KNEE REVISION Right 07/22/2019   Procedure: Right knee polyethylene exchange;  Surgeon: Gaynelle Arabian, MD;  Location: [facility];  Service: Orthopedics;  Laterality: Right;  113min    SOCIAL HISTORY: Social History   Socioeconomic History  . Marital status: Single    Spouse name: Not on file  . Number of children: 0  . Years of education: Not on file  . Highest education level: Not on file  Occupational History  . Occupation: Disabled.  Tobacco Use  . Smoking status: Former Smoker    Packs/day: 0.25    Years: 50.00    Pack years: 12.50    Types: Cigarettes    Quit date: 05/07/2013    Years since quitting: 7.3  . Smokeless tobacco: Never Used  Vaping Use  . Vaping Use: Never used  Substance and Sexual Activity  . Alcohol use: Not Currently    Comment: rare  . Drug use: No  . Sexual activity: Not on file  Other Topics Concern  . Not on file  Social History Narrative   Single.  Lives with family.   Social Determinants of Health   Financial Resource Strain: Not on file  Food Insecurity: Not on file  Transportation Needs: Not on file  Physical Activity: Not on file  Stress: Not on file  Social Connections: Not on file  Intimate Partner Violence: Not on file    FAMILY HISTORY: Family History  Problem Relation Age of Onset  . Breast cancer Neg Hx     ALLERGIES:  is allergic to erythromycin, aspirin, and penicillins.  MEDICATIONS:  Current Outpatient Medications  Medication Sig Dispense Refill  . acetaminophen (TYLENOL) 500 MG tablet Take 1,000 mg  by mouth every 6 (six) hours as needed for moderate pain.    . Ascorbic Acid (VITAMIN C) 1000 MG tablet Take 1,000 mg by mouth daily.    . Blood Glucose Monitoring Suppl (BLOOD GLUCOSE METER) kit Use as instructed 1 each 0  . Calcium Carbonate-Simethicone (MAALOX MAX PO) Take 5-10 mLs by mouth 3 (three) times daily as needed (for acid reflux/heartburn.).     Marland Kitchen gabapentin (NEURONTIN) 100 MG capsule Take a 100 mg capsule three times a day for two weeks following surgery.Then take a 100 mg capsule two times a day for two weeks. Then take a 100 mg capsule once a day for two weeks. Then discontinue. (Patient taking differently: Take 100 mg by mouth 2 (two) times daily.) 84 capsule 0  . hydrochlorothiazide (HYDRODIURIL) 25 MG tablet Take 1 tablet (25 mg total) by mouth daily.    Marland Kitchen losartan (COZAAR) 100 MG tablet Take 100 mg by mouth daily.    . metFORMIN (  GLUCOPHAGE) 500 MG tablet Take 500 mg by mouth 2 (two) times daily as needed (for blood sugar).  0  . methocarbamol (ROBAXIN) 500 MG tablet Take 1 tablet (500 mg total) by mouth every 6 (six) hours as needed for muscle spasms. 40 tablet 0  . omeprazole (PRILOSEC) 40 MG capsule Take 40 mg by mouth daily.  1  . polyethylene glycol powder (MIRALAX) 17 GM/SCOOP powder Take 17 g by mouth 2 (two) times daily as needed for moderate constipation. 255 g 0  . Psyllium (METAMUCIL) 48.57 % POWD Dissolve 3.3 gram (1 teaspoonful) in 8 ounce of water and take one to three times a day as needed for mild constipation 368 g 0  . simvastatin (ZOCOR) 40 MG tablet Take 40 mg by mouth daily.  3  . traMADol (ULTRAM) 50 MG tablet Take 1-2 tablets (50-100 mg total) by mouth every 6 (six) hours as needed for moderate pain. 40 tablet 0   No current facility-administered medications for this visit.    PHYSICAL EXAMINATION:  ECOG PERFORMANCE STATUS: 0 - Asymptomatic  Vitals:   09/13/20 0951  BP: 140/72  Pulse: 82  Resp: 18  Temp: 97.9 F (36.6 C)  SpO2: 100%   Filed  Weights   09/13/20 0951  Weight: 151 lb 12.8 oz (68.9 kg)    GENERAL:alert, no distress and comfortable BLE: Left knee swollen, no swelling of LLE Breast: Pendulous breasts, inverted nipples bilaterally, no definitive palpable masses in sitting position, no palpable lymphadenopathy in axillary region. We didn't get her on the exam table because of the knee issues.  LABORATORY DATA:  I have reviewed the data as listed Lab Results  Component Value Date   WBC 7.2 08/20/2020   HGB 10.5 (L) 08/20/2020   HCT 32.6 (L) 08/20/2020   MCV 90.6 08/20/2020   PLT 270 08/20/2020     Chemistry      Component Value Date/Time   NA 136 08/20/2020 1332   K 3.0 (L) 08/20/2020 1332   CL 103 08/20/2020 1332   CO2 22 08/20/2020 1332   BUN 10 08/20/2020 1332   CREATININE 1.11 (H) 08/20/2020 1332      Component Value Date/Time   CALCIUM 9.6 08/20/2020 1332   ALKPHOS 78 08/20/2020 1332   AST 16 08/20/2020 1332   ALT 11 08/20/2020 1332   BILITOT 0.7 08/20/2020 1332       RADIOGRAPHIC STUDIES: I have personally reviewed the radiological images as listed and agreed with the findings in the report. CT Abdomen Pelvis W Contrast  Result Date: 08/14/2020 CLINICAL DATA:  Diffuse abdominal pain. EXAM: CT ABDOMEN AND PELVIS WITH CONTRAST TECHNIQUE: Multidetector CT imaging of the abdomen and pelvis was performed using the standard protocol following bolus administration of intravenous contrast. CONTRAST:  18mL OMNIPAQUE IOHEXOL 300 MG/ML  SOLN COMPARISON:  None. FINDINGS: Lower chest: No acute abnormality. Hepatobiliary: Diffuse fatty infiltration of the liver parenchyma is seen. No focal liver abnormality is seen. No gallstones, gallbladder wall thickening, or biliary dilatation. Pancreas: Unremarkable. No pancreatic ductal dilatation or surrounding inflammatory changes. Spleen: Normal in size without focal abnormality. Adrenals/Urinary Tract: Adrenal glands are unremarkable. Kidneys are normal in size,  without renal calculi or hydronephrosis. 4.1 cm and 6.3 cm diameter cysts are seen within the right kidney. The urinary bladder is contracted and subsequently limited in evaluation. Stomach/Bowel: Stomach is within normal limits. Appendix appears normal. No evidence of bowel dilatation. Markedly inflamed diverticula are seen within the proximal sigmoid colon. There is no  evidence of associated perforation or abscess. Vascular/Lymphatic: Aortic atherosclerosis. No enlarged abdominal or pelvic lymph nodes. Reproductive: Status post hysterectomy. No adnexal masses. Other: No abdominal wall hernia or abnormality. No abdominopelvic ascites. Musculoskeletal: No acute or significant osseous findings. IMPRESSION: 1. Marked severity sigmoid diverticulitis. 2. Large simple cyst within the right kidney. 3. Aortic atherosclerosis. Aortic Atherosclerosis (ICD10-I70.0). Electronically Signed   By: Virgina Norfolk M.D.   On: 08/14/2020 22:47   DG Abd Portable 1V  Result Date: 08/16/2020 CLINICAL DATA:  Abdominal pain with diverticulitis EXAM: PORTABLE ABDOMEN - 1 VIEW COMPARISON:  CT abdomen and pelvis August 14, 2020 FINDINGS: There is moderate stool in the colon. There is no bowel dilatation or air-fluid level to suggest bowel obstruction. No evident free air. Lung bases clear. Vascular calcification noted in the pelvis. IMPRESSION: Moderate stool in colon. No demonstrable bowel obstruction or free air. Lung bases clear. Electronically Signed   By: Lowella Grip III M.D.   On: 08/16/2020 11:37    All questions were answered. The patient knows to call the clinic with any problems, questions or concerns.     Benay Pike, MD 09/13/2020 11:26 AM

## 2020-09-13 NOTE — Assessment & Plan Note (Signed)
Atypical lobular hyperplasia noted on left breast needle core biopsy,  We have discussed that atypical lobular hyperplasia is a pre precancerous condition and increases the risk of breast cancer by 3-5 times and hence she was referred to high risk breast cancer clinic for consideration of chemoprevention with tamoxifen or aromatase inhibitors.  During last visit we discussed about using tamoxifen or arimidex for prevention of breast cancer given ALH. She wanted to read about the medication. She read the material we gave her, She doesn't want to try antiestrogen therapy. She understands ALH increases her risk of breast cancer but she feels comfortable because she is going to get surgery. She agrees to surveillance visits every 6 months and mammo annually. Mammogram ordered. She will RTC in 6 months.

## 2020-09-27 ENCOUNTER — Other Ambulatory Visit: Payer: Self-pay | Admitting: Hematology and Oncology

## 2020-09-27 ENCOUNTER — Other Ambulatory Visit: Payer: Self-pay | Admitting: General Surgery

## 2020-09-27 ENCOUNTER — Ambulatory Visit: Payer: Self-pay | Admitting: General Surgery

## 2020-09-27 DIAGNOSIS — Z9189 Other specified personal risk factors, not elsewhere classified: Secondary | ICD-10-CM

## 2020-09-27 DIAGNOSIS — N6092 Unspecified benign mammary dysplasia of left breast: Secondary | ICD-10-CM

## 2020-09-28 ENCOUNTER — Other Ambulatory Visit: Payer: Self-pay | Admitting: Hematology and Oncology

## 2020-10-20 ENCOUNTER — Other Ambulatory Visit: Payer: Self-pay

## 2020-10-20 ENCOUNTER — Ambulatory Visit: Admission: RE | Admit: 2020-10-20 | Payer: Medicare HMO | Source: Ambulatory Visit

## 2020-10-20 ENCOUNTER — Ambulatory Visit
Admission: RE | Admit: 2020-10-20 | Discharge: 2020-10-20 | Disposition: A | Discharge status: Home / Self Care | Payer: Medicare HMO | Source: Ambulatory Visit | Attending: Hematology and Oncology | Admitting: Hematology and Oncology

## 2020-10-20 DIAGNOSIS — Z9189 Other specified personal risk factors, not elsewhere classified: Secondary | ICD-10-CM

## 2020-10-20 DIAGNOSIS — N6092 Unspecified benign mammary dysplasia of left breast: Secondary | ICD-10-CM

## 2020-10-24 ENCOUNTER — Ambulatory Visit
Admission: RE | Admit: 2020-10-24 | Discharge: 2020-10-24 | Disposition: A | Discharge status: Home / Self Care | Payer: Medicare HMO | Source: Ambulatory Visit | Attending: Hematology and Oncology | Admitting: Hematology and Oncology

## 2020-10-24 ENCOUNTER — Other Ambulatory Visit: Payer: Self-pay | Admitting: Hematology and Oncology

## 2020-10-24 ENCOUNTER — Other Ambulatory Visit: Payer: Self-pay

## 2020-10-24 DIAGNOSIS — N6092 Unspecified benign mammary dysplasia of left breast: Secondary | ICD-10-CM

## 2020-10-24 DIAGNOSIS — Z9189 Other specified personal risk factors, not elsewhere classified: Secondary | ICD-10-CM

## 2020-11-09 NOTE — Pre-Procedure Instructions (Addendum)
Surgical Instructions    Your procedure is scheduled on Monday, July 18th.  Report to [facility] Main Entrance "A" at 6:30 A.M., then check in with the Admitting office.  Call this number if you have problems the morning of surgery:  [phone]   If you have any questions prior to your surgery date call [phone]: Open Monday-Friday 8am-4pm    Remember:  Do not eat after midnight the night before your surgery  You may drink clear liquids until 5:30 a.m. the morning of your surgery.   Clear liquids allowed are: Water, Non-Citrus Juices (without pulp), Carbonated Beverages, Clear Tea, Black Coffee Only, and Gatorade. (Please choose sugar-free or diet options)    Take these medicines the morning of surgery with A SIP OF WATER  gabapentin (NEURONTIN)  omeprazole (PRILOSEC)  simvastatin (ZOCOR)   Take these medications as needed acetaminophen (TYLENOL) methocarbamol (ROBAXIN)   As of today, STOP taking any Aspirin (unless otherwise instructed by your surgeon) Aleve, Naproxen, Ibuprofen, Motrin, Advil, Goody's, BC's, all herbal medications, fish oil, and all vitamins.          WHAT DO I DO ABOUT MY DIABETES MEDICATION?  Do not take metFORMIN (GLUCOPHAGE) the morning of surgery.   HOW TO MANAGE YOUR DIABETES BEFORE AND AFTER SURGERY  Why is it important to control my blood sugar before and after surgery? Improving blood sugar levels before and after surgery helps healing and can limit problems. A way of improving blood sugar control is eating a healthy diet by:  Eating less sugar and carbohydrates  Increasing activity/exercise  Talking with your doctor about reaching your blood sugar goals High blood sugars (greater than 180 mg/dL) can raise your risk of infections and slow your recovery, so you will need to focus on controlling your diabetes during the weeks before surgery. Make sure that the doctor who takes care of your diabetes knows about your planned surgery including  the date and location.  How do I manage my blood sugar before surgery? Check your blood sugar at least 4 times a day, starting 2 days before surgery, to make sure that the level is not too high or low.  Check your blood sugar the morning of your surgery when you wake up and every 2 hours until you get to the Short Stay unit.  If your blood sugar is less than 70 mg/dL, you will need to treat for low blood sugar: Do not take insulin. Treat a low blood sugar (less than 70 mg/dL) with  cup of clear juice (cranberry or apple), 4 glucose tablets, OR glucose gel. Recheck blood sugar in 15 minutes after treatment (to make sure it is greater than 70 mg/dL). If your blood sugar is not greater than 70 mg/dL on recheck, call [phone] for further instructions. Report your blood sugar to the short stay nurse when you get to Short Stay.  If you are admitted to the hospital after surgery: Your blood sugar will be checked by the staff and you will probably be given insulin after surgery (instead of oral diabetes medicines) to make sure you have good blood sugar levels. The goal for blood sugar control after surgery is 80-180 mg/dL.             Do NOT Smoke (Tobacco/Vaping) or drink Alcohol 24 hours prior to your procedure.  If you use a CPAP at night, you may bring all equipment for your overnight stay.   Contacts, glasses, piercing's, hearing aid's, dentures or partials may  not be worn into surgery, please bring cases for these belongings.    For patients admitted to the hospital, discharge time will be determined by your treatment team.   Patients discharged the day of surgery will not be allowed to drive home, and someone needs to stay with them for 24 hours.  ONLY 1 SUPPORT PERSON MAY BE PRESENT WHILE YOU ARE IN SURGERY. IF YOU ARE TO BE ADMITTED ONCE YOU ARE IN YOUR ROOM YOU WILL BE ALLOWED TWO (2) VISITORS.  Minor children may have two parents present. Special consideration for safety and  communication needs will be reviewed on a case by case basis.   Special instructions:   [place]- Preparing For Surgery  Before surgery, you can play an important role. Because skin is not sterile, your skin needs to be as free of germs as possible. You can reduce the number of germs on your skin by washing with CHG (chlorahexidine gluconate) Soap before surgery.  CHG is an antiseptic cleaner which kills germs and bonds with the skin to continue killing germs even after washing.    Oral Hygiene is also important to reduce your risk of infection.  Remember - BRUSH YOUR TEETH THE MORNING OF SURGERY WITH YOUR REGULAR TOOTHPASTE  Please do not use if you have an allergy to CHG or antibacterial soaps. If your skin becomes reddened/irritated stop using the CHG.  Do not shave (including legs and underarms) for at least 48 hours prior to first CHG shower. It is OK to shave your face.  Please follow these instructions carefully.   Shower the NIGHT BEFORE SURGERY and the MORNING OF SURGERY  If you chose to wash your hair, wash your hair first as usual with your normal shampoo.  After you shampoo, rinse your hair and body thoroughly to remove the shampoo.  Use CHG Soap as you would any other liquid soap. You can apply CHG directly to the skin and wash gently with a scrungie or a clean washcloth.   Apply the CHG Soap to your body ONLY FROM THE NECK DOWN.  Do not use on open wounds or open sores. Avoid contact with your eyes, ears, mouth and genitals (private parts). Wash Face and genitals (private parts)  with your normal soap.   Wash thoroughly, paying special attention to the area where your surgery will be performed.  Thoroughly rinse your body with warm water from the neck down.  DO NOT shower/wash with your normal soap after using and rinsing off the CHG Soap.  Pat yourself dry with a CLEAN TOWEL.  Wear CLEAN PAJAMAS to bed the night before surgery  Place CLEAN SHEETS on your bed the  night before your surgery  DO NOT SLEEP WITH PETS.   Day of Surgery: Shower with CHG soap. Do not wear jewelry, make up, nail polish, gel polish, artificial nails, or any other type of covering on natural nails including finger and toenails. If patients have artificial nails, gel coating, etc. that need to be removed by a nail salon please have this removed prior to surgery. Surgery may need to be canceled/delayed if the surgeon/ anesthesia feels like the patient is unable to be adequately monitored. Do not wear lotions, powders, perfumes, or deodorant. Do not shave 48 hours prior to surgery.   Do not bring valuables to the hospital. [facility] is not responsible for any belongings or valuables. Wear Clean/Comfortable clothing the morning of surgery Remember to brush your teeth WITH YOUR REGULAR TOOTHPASTE.  Please read over the following fact sheets that you were given.

## 2020-11-10 ENCOUNTER — Other Ambulatory Visit: Payer: Self-pay

## 2020-11-10 ENCOUNTER — Encounter (HOSPITAL_COMMUNITY): Payer: Self-pay

## 2020-11-10 ENCOUNTER — Encounter (HOSPITAL_COMMUNITY)
Admission: RE | Admit: 2020-11-10 | Discharge: 2020-11-10 | Disposition: A | Discharge status: Home / Self Care | Payer: Medicare HMO | Source: Ambulatory Visit | Attending: General Surgery | Admitting: General Surgery

## 2020-11-10 DIAGNOSIS — Z01818 Encounter for other preprocedural examination: Secondary | ICD-10-CM | POA: Insufficient documentation

## 2020-11-10 HISTORY — DX: Nausea with vomiting, unspecified: R11.2

## 2020-11-10 HISTORY — DX: Other specified postprocedural states: Z98.890

## 2020-11-10 LAB — CBC
HCT: 34 % — ABNORMAL LOW (ref 36.0–46.0)
Hemoglobin: 10.9 g/dL — ABNORMAL LOW (ref 12.0–15.0)
MCH: 29.5 pg (ref 26.0–34.0)
MCHC: 32.1 g/dL (ref 30.0–36.0)
MCV: 91.9 fL (ref 80.0–100.0)
Platelets: 121 10*3/uL — ABNORMAL LOW (ref 150–400)
RBC: 3.7 MIL/uL — ABNORMAL LOW (ref 3.87–5.11)
RDW: 14.6 % (ref 11.5–15.5)
WBC: 4.6 10*3/uL (ref 4.0–10.5)
nRBC: 0 % (ref 0.0–0.2)

## 2020-11-10 LAB — HEMOGLOBIN A1C
Hgb A1c MFr Bld: 5.1 % (ref 4.8–5.6)
Mean Plasma Glucose: 99.67 mg/dL

## 2020-11-10 LAB — BASIC METABOLIC PANEL
Anion gap: 8 (ref 5–15)
BUN: 14 mg/dL (ref 8–23)
CO2: 26 mmol/L (ref 22–32)
Calcium: 9.4 mg/dL (ref 8.9–10.3)
Chloride: 103 mmol/L (ref 98–111)
Creatinine, Ser: 0.85 mg/dL (ref 0.44–1.00)
GFR, Estimated: >60 mL/min (ref >60)
Glucose, Bld: 95 mg/dL (ref 70–99)
Potassium: 3.8 mmol/L (ref 3.5–5.1)
Sodium: 137 mmol/L (ref 135–145)

## 2020-11-10 LAB — GLUCOSE, CAPILLARY: Glucose-Capillary: 84 mg/dL (ref 70–99)

## 2020-11-10 NOTE — Progress Notes (Signed)
PCP - Dr. Sigmund Hazel Cardiologist - denies  Chest x-ray - n/a EKG - 11/10/20 Stress Test - denies ECHO - denies Cardiac Cath - denies  Sleep Study - denies CPAP - denies  CBG at PAT 84 Fasting Blood Sugar - 100-130 Checks Blood Sugar 1 time a day Will collect A1C today.  Blood Thinner Instructions: n/a Aspirin Instructions:n/a  ERAS Protcol -Clear liquids until 0530 DOS PRE-SURGERY Ensure or G2- none ordered.  COVID TEST- Not indicated. Ambulatory surgery.  Anesthesia review: No  Patient denies shortness of breath, fever, cough and chest pain at PAT appointment   All instructions explained to the patient, with a verbal understanding of the material. Patient agrees to go over the instructions while at home for a better understanding. Patient also instructed to self quarantine after being tested for COVID-19. The opportunity to ask questions was provided.

## 2020-11-18 ENCOUNTER — Other Ambulatory Visit: Payer: Self-pay | Admitting: General Surgery

## 2020-11-18 ENCOUNTER — Inpatient Hospital Stay: Admission: RE | Admit: 2020-11-18 | Payer: Medicare HMO | Source: Ambulatory Visit

## 2020-11-18 DIAGNOSIS — N6092 Unspecified benign mammary dysplasia of left breast: Secondary | ICD-10-CM

## 2020-11-21 ENCOUNTER — Encounter (HOSPITAL_COMMUNITY): Admission: RE | Payer: Self-pay | Source: Home / Self Care

## 2020-11-21 ENCOUNTER — Ambulatory Visit (HOSPITAL_COMMUNITY): Admission: RE | Admit: 2020-11-21 | Payer: Medicare HMO | Source: Home / Self Care | Admitting: General Surgery

## 2020-11-21 ENCOUNTER — Other Ambulatory Visit: Payer: Self-pay | Admitting: Hematology and Oncology

## 2020-11-21 DIAGNOSIS — N6092 Unspecified benign mammary dysplasia of left breast: Secondary | ICD-10-CM

## 2020-11-21 SURGERY — BREAST LUMPECTOMY WITH RADIOACTIVE SEED LOCALIZATION
Anesthesia: General | Site: Breast | Laterality: Left

## 2020-11-24 ENCOUNTER — Other Ambulatory Visit: Payer: Self-pay | Admitting: Hematology and Oncology

## 2020-11-24 ENCOUNTER — Other Ambulatory Visit: Payer: Self-pay

## 2020-11-24 ENCOUNTER — Ambulatory Visit
Admission: RE | Admit: 2020-11-24 | Discharge: 2020-11-24 | Disposition: A | Discharge status: Home / Self Care | Payer: Medicare HMO | Source: Ambulatory Visit | Attending: Hematology and Oncology | Admitting: Hematology and Oncology

## 2020-11-24 ENCOUNTER — Ambulatory Visit
Admission: RE | Admit: 2020-11-24 | Discharge: 2020-11-24 | Disposition: A | Discharge status: Home / Self Care | Payer: Medicare HMO | Source: Ambulatory Visit | Attending: General Surgery | Admitting: General Surgery

## 2020-11-24 DIAGNOSIS — N6092 Unspecified benign mammary dysplasia of left breast: Secondary | ICD-10-CM

## 2020-11-29 ENCOUNTER — Ambulatory Visit: Payer: Self-pay | Admitting: General Surgery

## 2020-11-29 DIAGNOSIS — N6092 Unspecified benign mammary dysplasia of left breast: Secondary | ICD-10-CM

## 2020-12-05 ENCOUNTER — Other Ambulatory Visit: Payer: Self-pay | Admitting: General Surgery

## 2020-12-05 DIAGNOSIS — N6092 Unspecified benign mammary dysplasia of left breast: Secondary | ICD-10-CM

## 2020-12-08 ENCOUNTER — Telehealth: Payer: Self-pay | Admitting: Hematology and Oncology

## 2020-12-08 NOTE — Telephone Encounter (Signed)
Scheduled per sch msg. Called and was not able to leave msg. Mailed printout  

## 2020-12-28 ENCOUNTER — Other Ambulatory Visit: Payer: Self-pay

## 2020-12-28 ENCOUNTER — Encounter (HOSPITAL_BASED_OUTPATIENT_CLINIC_OR_DEPARTMENT_OTHER): Payer: Self-pay | Admitting: General Surgery

## 2021-01-02 ENCOUNTER — Encounter (HOSPITAL_BASED_OUTPATIENT_CLINIC_OR_DEPARTMENT_OTHER)
Admission: RE | Admit: 2021-01-02 | Discharge: 2021-01-02 | Disposition: A | Discharge status: Home / Self Care | Payer: Medicare HMO | Source: Ambulatory Visit | Attending: General Surgery | Admitting: General Surgery

## 2021-01-02 DIAGNOSIS — Z01812 Encounter for preprocedural laboratory examination: Secondary | ICD-10-CM | POA: Diagnosis not present

## 2021-01-02 LAB — BASIC METABOLIC PANEL
Anion gap: 8 (ref 5–15)
BUN: 14 mg/dL (ref 8–23)
CO2: 26 mmol/L (ref 22–32)
Calcium: 9.3 mg/dL (ref 8.9–10.3)
Chloride: 106 mmol/L (ref 98–111)
Creatinine, Ser: 0.73 mg/dL (ref 0.44–1.00)
GFR, Estimated: >60 mL/min (ref >60)
Glucose, Bld: 103 mg/dL — ABNORMAL HIGH (ref 70–99)
Potassium: 3.6 mmol/L (ref 3.5–5.1)
Sodium: 140 mmol/L (ref 135–145)

## 2021-01-02 NOTE — Progress Notes (Signed)

## 2021-01-03 ENCOUNTER — Other Ambulatory Visit: Payer: Self-pay | Admitting: General Surgery

## 2021-01-03 ENCOUNTER — Ambulatory Visit
Admission: RE | Admit: 2021-01-03 | Discharge: 2021-01-03 | Disposition: A | Discharge status: Home / Self Care | Payer: Medicare HMO | Source: Ambulatory Visit | Attending: General Surgery | Admitting: General Surgery

## 2021-01-03 ENCOUNTER — Other Ambulatory Visit: Payer: Self-pay

## 2021-01-03 DIAGNOSIS — N6092 Unspecified benign mammary dysplasia of left breast: Secondary | ICD-10-CM

## 2021-01-04 ENCOUNTER — Encounter (HOSPITAL_BASED_OUTPATIENT_CLINIC_OR_DEPARTMENT_OTHER)
Admission: RE | Disposition: A | Discharge status: Home / Self Care | Payer: Self-pay | Source: Home / Self Care | Attending: General Surgery

## 2021-01-04 ENCOUNTER — Ambulatory Visit (HOSPITAL_BASED_OUTPATIENT_CLINIC_OR_DEPARTMENT_OTHER): Payer: Medicare HMO | Admitting: Certified Registered"

## 2021-01-04 ENCOUNTER — Ambulatory Visit
Admission: RE | Admit: 2021-01-04 | Discharge: 2021-01-04 | Disposition: A | Discharge status: Home / Self Care | Payer: Medicare HMO | Source: Ambulatory Visit | Attending: General Surgery | Admitting: General Surgery

## 2021-01-04 ENCOUNTER — Other Ambulatory Visit: Payer: Self-pay

## 2021-01-04 ENCOUNTER — Encounter (HOSPITAL_BASED_OUTPATIENT_CLINIC_OR_DEPARTMENT_OTHER): Payer: Self-pay | Admitting: General Surgery

## 2021-01-04 ENCOUNTER — Ambulatory Visit (HOSPITAL_BASED_OUTPATIENT_CLINIC_OR_DEPARTMENT_OTHER)
Admission: RE | Admit: 2021-01-04 | Discharge: 2021-01-04 | Disposition: A | Discharge status: Home / Self Care | Payer: Medicare HMO | Attending: General Surgery | Admitting: General Surgery

## 2021-01-04 DIAGNOSIS — N6092 Unspecified benign mammary dysplasia of left breast: Secondary | ICD-10-CM

## 2021-01-04 DIAGNOSIS — N6022 Fibroadenosis of left breast: Secondary | ICD-10-CM | POA: Diagnosis not present

## 2021-01-04 DIAGNOSIS — Z886 Allergy status to analgesic agent status: Secondary | ICD-10-CM | POA: Diagnosis not present

## 2021-01-04 DIAGNOSIS — Z87891 Personal history of nicotine dependence: Secondary | ICD-10-CM | POA: Diagnosis not present

## 2021-01-04 DIAGNOSIS — N61 Mastitis without abscess: Secondary | ICD-10-CM | POA: Insufficient documentation

## 2021-01-04 DIAGNOSIS — Z7984 Long term (current) use of oral hypoglycemic drugs: Secondary | ICD-10-CM | POA: Diagnosis not present

## 2021-01-04 DIAGNOSIS — Z79899 Other long term (current) drug therapy: Secondary | ICD-10-CM | POA: Diagnosis not present

## 2021-01-04 DIAGNOSIS — Z7901 Long term (current) use of anticoagulants: Secondary | ICD-10-CM | POA: Diagnosis not present

## 2021-01-04 DIAGNOSIS — Z880 Allergy status to penicillin: Secondary | ICD-10-CM | POA: Diagnosis not present

## 2021-01-04 DIAGNOSIS — Z79891 Long term (current) use of opiate analgesic: Secondary | ICD-10-CM | POA: Diagnosis not present

## 2021-01-04 HISTORY — DX: Unspecified benign mammary dysplasia of left breast: N60.92

## 2021-01-04 HISTORY — PX: BREAST LUMPECTOMY WITH RADIOACTIVE SEED LOCALIZATION: SHX6424

## 2021-01-04 LAB — GLUCOSE, CAPILLARY
Glucose-Capillary: 109 mg/dL — ABNORMAL HIGH (ref 70–99)
Glucose-Capillary: 149 mg/dL — ABNORMAL HIGH (ref 70–99)

## 2021-01-04 SURGERY — BREAST LUMPECTOMY WITH RADIOACTIVE SEED LOCALIZATION
Anesthesia: General | Site: Breast | Laterality: Left

## 2021-01-04 MED ORDER — MEPERIDINE HCL 25 MG/ML IJ SOLN: 6.2500 mg | INTRAMUSCULAR | Status: DC | PRN

## 2021-01-04 MED ORDER — GABAPENTIN 300 MG PO CAPS
300.0000 mg | ORAL_CAPSULE | ORAL | Status: AC
  Administered 2021-01-04: 300 mg via ORAL

## 2021-01-04 MED ORDER — EPHEDRINE SULFATE 50 MG/ML IJ SOLN
INTRAMUSCULAR | Status: DC | PRN
  Administered 2021-01-04: 10 mg via INTRAVENOUS

## 2021-01-04 MED ORDER — LIDOCAINE HCL (PF) 2 % IJ SOLN
INTRAMUSCULAR | Status: AC
  Filled 2021-01-04: qty 5

## 2021-01-04 MED ORDER — CHLORHEXIDINE GLUCONATE CLOTH 2 % EX PADS: 6.0000 | MEDICATED_PAD | Freq: Once | CUTANEOUS | Status: DC

## 2021-01-04 MED ORDER — ONDANSETRON HCL 4 MG/2ML IJ SOLN
INTRAMUSCULAR | Status: AC
  Filled 2021-01-04: qty 2

## 2021-01-04 MED ORDER — PHENYLEPHRINE HCL (PRESSORS) 10 MG/ML IV SOLN
INTRAVENOUS | Status: DC | PRN
  Administered 2021-01-04 (×2): 80 ug via INTRAVENOUS
  Administered 2021-01-04: 40 ug via INTRAVENOUS

## 2021-01-04 MED ORDER — EPHEDRINE 5 MG/ML INJ
INTRAVENOUS | Status: AC
  Filled 2021-01-04: qty 10

## 2021-01-04 MED ORDER — HYDROMORPHONE HCL 1 MG/ML IJ SOLN: 0.2500 mg | INTRAMUSCULAR | Status: DC | PRN

## 2021-01-04 MED ORDER — FENTANYL CITRATE (PF) 100 MCG/2ML IJ SOLN
INTRAMUSCULAR | Status: AC
  Filled 2021-01-04: qty 2

## 2021-01-04 MED ORDER — PROPOFOL 10 MG/ML IV BOLUS
INTRAVENOUS | Status: DC | PRN
  Administered 2021-01-04: 200 mg via INTRAVENOUS

## 2021-01-04 MED ORDER — PROPOFOL 10 MG/ML IV BOLUS
INTRAVENOUS | Status: AC
  Filled 2021-01-04: qty 20

## 2021-01-04 MED ORDER — FENTANYL CITRATE (PF) 100 MCG/2ML IJ SOLN
INTRAMUSCULAR | Status: DC | PRN
  Administered 2021-01-04 (×2): 25 ug via INTRAVENOUS

## 2021-01-04 MED ORDER — ACETAMINOPHEN 500 MG PO TABS
1000.0000 mg | ORAL_TABLET | ORAL | Status: AC
  Administered 2021-01-04: 1000 mg via ORAL

## 2021-01-04 MED ORDER — OXYCODONE HCL 5 MG/5ML PO SOLN: 5.0000 mg | Freq: Once | ORAL | Status: AC | PRN

## 2021-01-04 MED ORDER — OXYCODONE HCL 5 MG PO TABS
ORAL_TABLET | ORAL | Status: AC
  Filled 2021-01-04: qty 1

## 2021-01-04 MED ORDER — BUPIVACAINE-EPINEPHRINE (PF) 0.25% -1:200000 IJ SOLN
INTRAMUSCULAR | Status: AC
  Filled 2021-01-04: qty 30

## 2021-01-04 MED ORDER — LACTATED RINGERS IV SOLN: INTRAVENOUS | Status: DC

## 2021-01-04 MED ORDER — ONDANSETRON HCL 4 MG/2ML IJ SOLN
INTRAMUSCULAR | Status: DC | PRN
  Administered 2021-01-04: 4 mg via INTRAVENOUS

## 2021-01-04 MED ORDER — GABAPENTIN 300 MG PO CAPS
ORAL_CAPSULE | ORAL | Status: AC
  Filled 2021-01-04: qty 1

## 2021-01-04 MED ORDER — DROPERIDOL 2.5 MG/ML IJ SOLN
INTRAMUSCULAR | Status: DC | PRN
  Administered 2021-01-04: .625 mg via INTRAVENOUS

## 2021-01-04 MED ORDER — MIDAZOLAM HCL 5 MG/5ML IJ SOLN
INTRAMUSCULAR | Status: DC | PRN
  Administered 2021-01-04: 2 mg via INTRAVENOUS

## 2021-01-04 MED ORDER — PROMETHAZINE HCL 25 MG/ML IJ SOLN: 6.2500 mg | INTRAMUSCULAR | Status: DC | PRN

## 2021-01-04 MED ORDER — VANCOMYCIN HCL IN DEXTROSE 1-5 GM/200ML-% IV SOLN
1000.0000 mg | INTRAVENOUS | Status: AC
  Administered 2021-01-04: 1000 mg via INTRAVENOUS

## 2021-01-04 MED ORDER — DEXAMETHASONE SODIUM PHOSPHATE 10 MG/ML IJ SOLN
INTRAMUSCULAR | Status: AC
  Filled 2021-01-04: qty 1

## 2021-01-04 MED ORDER — DEXAMETHASONE SODIUM PHOSPHATE 10 MG/ML IJ SOLN
INTRAMUSCULAR | Status: DC | PRN
  Administered 2021-01-04: 4 mg via INTRAVENOUS

## 2021-01-04 MED ORDER — OXYCODONE HCL 5 MG PO TABS
5.0000 mg | ORAL_TABLET | Freq: Once | ORAL | Status: AC | PRN
  Administered 2021-01-04: 5 mg via ORAL

## 2021-01-04 MED ORDER — VANCOMYCIN HCL IN DEXTROSE 1-5 GM/200ML-% IV SOLN
INTRAVENOUS | Status: AC
  Filled 2021-01-04: qty 200

## 2021-01-04 MED ORDER — PHENYLEPHRINE 40 MCG/ML (10ML) SYRINGE FOR IV PUSH (FOR BLOOD PRESSURE SUPPORT)
PREFILLED_SYRINGE | INTRAVENOUS | Status: AC
  Filled 2021-01-04: qty 10

## 2021-01-04 MED ORDER — 0.9 % SODIUM CHLORIDE (POUR BTL) OPTIME
TOPICAL | Status: DC | PRN
  Administered 2021-01-04: 1000 mL

## 2021-01-04 MED ORDER — MIDAZOLAM HCL 2 MG/2ML IJ SOLN
INTRAMUSCULAR | Status: AC
  Filled 2021-01-04: qty 2

## 2021-01-04 MED ORDER — ACETAMINOPHEN 500 MG PO TABS
ORAL_TABLET | ORAL | Status: AC
  Filled 2021-01-04: qty 2

## 2021-01-04 MED ORDER — LIDOCAINE HCL (CARDIAC) PF 100 MG/5ML IV SOSY
PREFILLED_SYRINGE | INTRAVENOUS | Status: DC | PRN
  Administered 2021-01-04: 60 mg via INTRAVENOUS

## 2021-01-04 MED ORDER — BUPIVACAINE-EPINEPHRINE (PF) 0.25% -1:200000 IJ SOLN
INTRAMUSCULAR | Status: DC | PRN
  Administered 2021-01-04: 28 mL via PERINEURAL

## 2021-01-04 MED ORDER — DROPERIDOL 2.5 MG/ML IJ SOLN
INTRAMUSCULAR | Status: AC
  Filled 2021-01-04: qty 2

## 2021-01-04 MED ORDER — TRAMADOL HCL 50 MG PO TABS: 50.0000 mg | ORAL_TABLET | Freq: Four times a day (QID) | ORAL | 0 refills | Status: AC | PRN

## 2021-01-04 SURGICAL SUPPLY — 42 items
ADH SKN CLS APL DERMABOND .7 (GAUZE/BANDAGES/DRESSINGS) ×1
APL PRP STRL LF DISP 70% ISPRP (MISCELLANEOUS) ×1
APPLIER CLIP 9.375 MED OPEN (MISCELLANEOUS)
APR CLP MED 9.3 20 MLT OPN (MISCELLANEOUS)
BLADE SURG 15 STRL LF DISP TIS (BLADE) ×1 IMPLANT
BLADE SURG 15 STRL SS (BLADE) ×2
CANISTER SUC SOCK COL 7IN (MISCELLANEOUS) IMPLANT
CANISTER SUCT 1200ML W/VALVE (MISCELLANEOUS) ×2 IMPLANT
CHLORAPREP W/TINT 26 (MISCELLANEOUS) ×2 IMPLANT
CLIP APPLIE 9.375 MED OPEN (MISCELLANEOUS) IMPLANT
COVER BACK TABLE 60X90IN (DRAPES) ×2 IMPLANT
COVER MAYO STAND STRL (DRAPES) ×2 IMPLANT
COVER PROBE W GEL 5X96 (DRAPES) ×2 IMPLANT
DECANTER SPIKE VIAL GLASS SM (MISCELLANEOUS) IMPLANT
DERMABOND ADVANCED (GAUZE/BANDAGES/DRESSINGS) ×1
DERMABOND ADVANCED .7 DNX12 (GAUZE/BANDAGES/DRESSINGS) ×1 IMPLANT
DRAPE LAPAROSCOPIC ABDOMINAL (DRAPES) ×2 IMPLANT
DRAPE UTILITY XL STRL (DRAPES) ×2 IMPLANT
ELECT COATED BLADE 2.86 ST (ELECTRODE) ×2 IMPLANT
ELECT REM PT RETURN 9FT ADLT (ELECTROSURGICAL) ×2
ELECTRODE REM PT RTRN 9FT ADLT (ELECTROSURGICAL) ×1 IMPLANT
GLOVE SURG ENC MOIS LTX SZ7.5 (GLOVE) ×4 IMPLANT
GOWN STRL REUS W/ TWL LRG LVL3 (GOWN DISPOSABLE) ×2 IMPLANT
GOWN STRL REUS W/TWL LRG LVL3 (GOWN DISPOSABLE) ×4
ILLUMINATOR WAVEGUIDE N/F (MISCELLANEOUS) IMPLANT
KIT MARKER MARGIN INK (KITS) ×2 IMPLANT
LIGHT WAVEGUIDE WIDE FLAT (MISCELLANEOUS) IMPLANT
NEEDLE HYPO 25X1 1.5 SAFETY (NEEDLE) IMPLANT
NS IRRIG 1000ML POUR BTL (IV SOLUTION) ×2 IMPLANT
PACK BASIN DAY SURGERY FS (CUSTOM PROCEDURE TRAY) ×2 IMPLANT
PENCIL SMOKE EVACUATOR (MISCELLANEOUS) ×2 IMPLANT
SLEEVE SCD COMPRESS KNEE MED (STOCKING) ×2 IMPLANT
SPONGE T-LAP 18X18 ~~LOC~~+RFID (SPONGE) ×2 IMPLANT
SUT MNCRL AB 4-0 PS2 18 (SUTURE) ×4 IMPLANT
SUT MON AB 4-0 PC3 18 (SUTURE) ×2 IMPLANT
SUT SILK 2 0 SH (SUTURE) IMPLANT
SUT VICRYL 3-0 CR8 SH (SUTURE) ×2 IMPLANT
SYR CONTROL 10ML LL (SYRINGE) IMPLANT
TOWEL GREEN STERILE FF (TOWEL DISPOSABLE) ×2 IMPLANT
TRAY FAXITRON CT DISP (TRAY / TRAY PROCEDURE) ×2 IMPLANT
TUBE CONNECTING 20X1/4 (TUBING) IMPLANT
YANKAUER SUCT BULB TIP NO VENT (SUCTIONS) IMPLANT

## 2021-01-04 NOTE — Interval H&P Note (Signed)
History and Physical Interval Note:  01/04/2021 8:25 AM  Connie Branch  has presented today for surgery, with the diagnosis of LEFT BREAST ALH.  The various methods of treatment have been discussed with the patient and family. After consideration of risks, benefits and other options for treatment, the patient has consented to  Procedure(s): LEFT BREAST LUMPECTOMY WITH RADIOACTIVE SEED LOCALIZATION X 3 (Left) as a surgical intervention.  The patient's history has been reviewed, patient examined, no change in status, stable for surgery.  I have reviewed the patient's chart and labs.  Questions were answered to the patient's satisfaction.     Chevis Pretty III

## 2021-01-04 NOTE — Transfer of Care (Signed)
Immediate Anesthesia Transfer of Care Note  Patient: Connie Branch  Procedure(s) Performed: LEFT BREAST LUMPECTOMY WITH RADIOACTIVE SEED LOCALIZATION X 3 (Left: Breast)  Patient Location: PACU  Anesthesia Type:General  Level of Consciousness: awake, alert  and oriented  Airway & Oxygen Therapy: Patient Spontanous Breathing and Patient connected to face mask oxygen  Post-op Assessment: Report given to RN and Post -op Vital signs reviewed and stable  Post vital signs: Reviewed and stable  Last Vitals:  Vitals Value Taken Time  BP    Temp    Pulse 85 01/04/21 1006  Resp 13 01/04/21 1006  SpO2 100 % 01/04/21 1006  Vitals shown include unvalidated device data.  Last Pain:  Vitals:   01/04/21 0741  TempSrc: Oral  PainSc: 0-No pain      Patients Stated Pain Goal: 4 (01/04/21 0741)  Complications: No notable events documented.

## 2021-01-04 NOTE — Discharge Instructions (Signed)
No Tylenol before 1:50pm today.   Post Anesthesia Home Care Instructions  Activity: Get plenty of rest for the remainder of the day. A responsible individual must stay with you for 24 hours following the procedure.  For the next 24 hours, DO NOT: -Drive a car -Advertising copywriter -Drink alcoholic beverages -Take any medication unless instructed by your physician -Make any legal decisions or sign important papers.  Meals: Start with liquid foods such as gelatin or soup. Progress to regular foods as tolerated. Avoid greasy, spicy, heavy foods. If nausea and/or vomiting occur, drink only clear liquids until the nausea and/or vomiting subsides. Call your physician if vomiting continues.  Special Instructions/Symptoms: Your throat may feel dry or sore from the anesthesia or the breathing tube placed in your throat during surgery. If this causes discomfort, gargle with warm salt water. The discomfort should disappear within 24 hours.  If you had a scopolamine patch placed behind your ear for the management of post- operative nausea and/or vomiting:  1. The medication in the patch is effective for 72 hours, after which it should be removed.  Wrap patch in a tissue and discard in the trash. Wash hands thoroughly with soap and water. 2. You may remove the patch earlier than 72 hours if you experience unpleasant side effects which may include dry mouth, dizziness or visual disturbances. 3. Avoid touching the patch. Wash your hands with soap and water after contact with the patch.

## 2021-01-04 NOTE — Anesthesia Preprocedure Evaluation (Addendum)
Anesthesia Evaluation  Patient identified by MRN, date of birth, ID band Patient awake    Reviewed: Allergy & Precautions, NPO status , Patient's Chart, lab work & pertinent test results  History of Anesthesia Complications (+) PONV  Airway Mallampati: II  TM Distance: >3 FB Neck ROM: Full    Dental  (+) Dental Advisory Given, Edentulous Upper   Pulmonary neg pulmonary ROS, former smoker,    Pulmonary exam normal breath sounds clear to auscultation       Cardiovascular hypertension, Pt. on medications Normal cardiovascular exam Rhythm:Regular Rate:Normal     Neuro/Psych  Headaches, Anxiety negative psych ROS   GI/Hepatic Neg liver ROS, GERD  Medicated,  Endo/Other  diabetes, Oral Hypoglycemic AgentsMorbid obesity  Renal/GU Renal disease     Musculoskeletal  (+) Arthritis , Osteoarthritis,    Abdominal   Peds  Hematology negative hematology ROS (+) anemia ,   Anesthesia Other Findings   Reproductive/Obstetrics negative OB ROS                            Anesthesia Physical  Anesthesia Plan  ASA: III  Anesthesia Plan: General   Post-op Pain Management:    Induction: Intravenous  PONV Risk Score and Plan: 4 or greater and Treatment may vary due to age or medical condition, Ondansetron, Dexamethasone, Midazolam and Droperidol  Airway Management Planned: LMA  Additional Equipment:   Intra-op Plan:   Post-operative Plan: Extubation in OR  Informed Consent: I have reviewed the patients History and Physical, chart, labs and discussed the procedure including the risks, benefits and alternatives for the proposed anesthesia with the patient or authorized representative who has indicated his/her understanding and acceptance.     Dental advisory given  Plan Discussed with: CRNA  Anesthesia Plan Comments: (  )       Anesthesia Quick Evaluation

## 2021-01-04 NOTE — H&P (Signed)
[name]  Location: [facility] Patient #: [id number] DOB: Aug 05, 1950 Single / Language: Connie Branch / Race: Black or African American Female   History of Present Illness  The patient is a 70 year old female who presents for a follow-up for Breast problems. The patient is a 70 year old black female who we saw earlier this year with 2 areas of atypical lobular hyperplasia in the inner left breast.  She had about 7 cm of distortion in this area.  The presence of atypical lobular hyperplasia increased her risk of breast cancer to approximately 25% which puts her in a high risk category.  She met with the oncology doctors at the high-risk clinic who recommended anti-estrogens but she declined.  She denies any breast pain.  She did not schedule surgery earlier this year because of the need for hip surgery which she has completed.  She still has pain associated with that surgery.  She denies any breast problems.   Allergies  Penicillins   Aspirin *ANALGESICS - NonNarcotic*   Allergies Reconciled    Medication History  HYDROcodone-Acetaminophen  (5-325MG Tablet, Oral) Active. traMADol HCl  (50MG Tablet, Oral) Active. Accu-Chek Aviva Plus  (In Vitro) Active. Accu-Chek Softclix Lancets  Active. Accu-Chek Guide  (w/Device Kit,) Active. Gabapentin  (300MG Capsule, Oral) Active. Clindamycin HCl  (300MG Capsule, Oral) Active. Losartan Potassium  (100MG Tablet, Oral) Active. hydroCHLOROthiazide  (25MG Tablet, Oral) Active. Methocarbamol  (500MG Tablet, Oral) Active. Omeprazole  (40MG Capsule DR, Oral) Active. Potassium Chloride ER  (20MEQ Tablet ER, Oral) Active. Gabapentin  (100MG Capsule, Oral) Active. metFORMIN HCl  (500MG Tablet, Oral) Active. Simvastatin  (40MG Tablet, Oral) Active. Xarelto  (10MG Tablet, Oral) Active. Medications Reconciled     Review of Systems  General Not Present- Appetite Loss, Chills, Fatigue, Fever, Night Sweats, Weight Gain and Weight Loss. Skin  Not Present- Change in Wart/Mole, Dryness, Hives, Jaundice, New Lesions, Non-Healing Wounds, Rash and Ulcer. HEENT Not Present- Earache, Hearing Loss, Hoarseness, Nose Bleed, Oral Ulcers, Ringing in the Ears, Seasonal Allergies, Sinus Pain, Sore Throat, Visual Disturbances, Wears glasses/contact lenses and Yellow Eyes. Respiratory Present- Snoring. Not Present- Bloody sputum, Chronic Cough, Difficulty Breathing and Wheezing. Cardiovascular Not Present- Chest Pain, Difficulty Breathing Lying Down, Leg Cramps, Palpitations, Rapid Heart Rate, Shortness of Breath and Swelling of Extremities. Gastrointestinal Not Present- Abdominal Pain, Bloating, Bloody Stool, Change in Bowel Habits, Chronic diarrhea, Constipation, Difficulty Swallowing, Excessive gas, Gets full quickly at meals, Hemorrhoids, Indigestion, Nausea, Rectal Pain and Vomiting. Female Genitourinary Not Present- Frequency, Nocturia, Painful Urination, Pelvic Pain and Urgency. Musculoskeletal Not Present- Back Pain, Joint Pain, Joint Stiffness, Muscle Pain, Muscle Weakness and Swelling of Extremities. Neurological Not Present- Decreased Memory, Fainting, Headaches, Numbness, Seizures, Tingling, Tremor, Trouble walking and Weakness. Psychiatric Not Present- Anxiety, Bipolar, Change in Sleep Pattern, Depression, Fearful and Frequent crying. Endocrine Not Present- Cold Intolerance, Excessive Hunger, Hair Changes, Heat Intolerance, Hot flashes and New Diabetes. Hematology Not Present- Blood Thinners, Easy Bruising, Excessive bleeding, Gland problems, HIV and Persistent Infections.   Physical Exam  General Mental Status - Alert. General Appearance - Consistent with stated age. Hydration - Well hydrated. Voice - Normal.  Head and Neck Head - normocephalic, atraumatic with no lesions or palpable masses. Trachea - midline. Thyroid Gland Characteristics - normal size and consistency.  Eye Eyeball - Bilateral - Extraocular movements  intact. Sclera/Conjunctiva - Bilateral - No scleral icterus.  Chest and Lung Exam Chest and lung exam reveals  - quiet, even and easy respiratory effort with  no use of accessory muscles and on auscultation, normal breath sounds, no adventitious sounds and normal vocal resonance. Inspection Chest Wall - Normal. Back - normal.  Breast Note:  There is palpable scar tissue at the needlestick sites in the left breast.  Other than this there is no palpable mass in either breast.  There is no palpable axillary, supraclavicular, or cervical lymphadenopathy.   Cardiovascular Cardiovascular examination reveals  - normal heart sounds, regular rate and rhythm with no murmurs and normal pedal pulses bilaterally.  Abdomen Inspection Inspection of the abdomen reveals - No Hernias. Skin - Scar - no surgical scars. Palpation/Percussion Palpation and Percussion of the abdomen reveal - Soft, Non Tender, No Rebound tenderness, No Rigidity (guarding) and No hepatosplenomegaly. Auscultation Auscultation of the abdomen reveals - Bowel sounds normal.  Neurologic Neurologic evaluation reveals  - alert and oriented x 3 with no impairment of recent or remote memory. Mental Status - Normal.  Musculoskeletal Normal Exam - Left - Upper Extremity Strength Normal and Lower Extremity Strength Normal. Normal Exam - Right - Upper Extremity Strength Normal and Lower Extremity Strength Normal.  Lymphatic Head & Neck  General Head & Neck Lymphatics: Bilateral - Description - Normal. Axillary  General Axillary Region: Bilateral - Description - Normal. Tenderness - Non Tender. Femoral & Inguinal  Generalized Femoral & Inguinal Lymphatics: Bilateral - Description - Normal. Tenderness - Non Tender.    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 At that time she was  unable to schedule surgery. She is now ready to schedule lumpectomies for these 2 areas. I have discussed with her in detail the risks and benefits of the operation as well as some of the technical aspects including the use of a radioactive seed for localization and she understands and wishes to proceed. She has met with the high-risk clinic doctors and declines anti-estrogens. This patient encounter took 20 minutes today to perform the following: take history, perform exam, review outside records, interpret imaging, counsel the patient on their diagnosis and document encounter, findings & plan in the EHR

## 2021-01-04 NOTE — Anesthesia Postprocedure Evaluation (Signed)
Anesthesia Post Note  Patient: Connie Branch  Procedure(s) Performed: LEFT BREAST LUMPECTOMY WITH RADIOACTIVE SEED LOCALIZATION X 3 (Left: Breast)     Patient location during evaluation: PACU Anesthesia Type: General Level of consciousness: awake and alert Pain management: pain level controlled Vital Signs Assessment: post-procedure vital signs reviewed and stable Respiratory status: spontaneous breathing, nonlabored ventilation and respiratory function stable Cardiovascular status: blood pressure returned to baseline and stable Postop Assessment: no apparent nausea or vomiting Anesthetic complications: no   No notable events documented.  Last Vitals:  Vitals:   01/04/21 1015 01/04/21 1036  BP: (!) 160/67 (!) 170/84  Pulse: 69 63  Resp: 17 18  Temp:  36.6 C  SpO2: 99% 100%    Last Pain:  Vitals:   01/04/21 1036  TempSrc:   PainSc: 3                  Lowella Curb

## 2021-01-04 NOTE — Anesthesia Procedure Notes (Signed)
Procedure Name: LMA Insertion Date/Time: 01/04/2021 8:47 AM Performed by: Lauralyn Primes, CRNA Pre-anesthesia Checklist: Patient identified, Emergency Drugs available, Suction available and Patient being monitored Patient Re-evaluated:Patient Re-evaluated prior to induction Oxygen Delivery Method: Circle system utilized Preoxygenation: Pre-oxygenation with 100% oxygen Induction Type: IV induction Ventilation: Mask ventilation without difficulty LMA: LMA inserted LMA Size: 4.0 Number of attempts: 1 Airway Equipment and Method: Bite block Placement Confirmation: positive ETCO2 Tube secured with: Tape Dental Injury: Teeth and Oropharynx as per pre-operative assessment

## 2021-01-04 NOTE — Op Note (Signed)
01/04/2021  9:57 AM  PATIENT:  Connie Branch  70 y.o. female  PRE-OPERATIVE DIAGNOSIS:  LEFT BREAST ALH  POST-OPERATIVE DIAGNOSIS:  LEFT BREAST ALH  PROCEDURE:  Procedure(s): LEFT BREAST LUMPECTOMY WITH RADIOACTIVE SEED LOCALIZATION X 3 (Left)  SURGEON:  Surgeon(s) and Role:    Griselda Miner, MD - Primary  PHYSICIAN ASSISTANT:   ASSISTANTS: none   ANESTHESIA:   local and general  EBL:  10 mL   BLOOD ADMINISTERED:none  DRAINS: none   LOCAL MEDICATIONS USED:  MARCAINE     SPECIMEN:  Source of Specimen:  medial left breast alh, superiorlateral alh  DISPOSITION OF SPECIMEN:  PATHOLOGY  COUNTS:  YES  TOURNIQUET:  * No tourniquets in log *  DICTATION: .Dragon Dictation  After informed consent was obtained the patient was brought to the operating room and placed in the supine position on the operating table.  After adequate induction of general anesthesia the patient's left breast was prepped with ChloraPrep, allowed to dry, and draped in usual sterile manner.  An appropriate timeout was performed.  Previously an I-125 seed was placed in the medial aspect of the left breast to mark an area of atypical lobular hyperplasia.  The patient also had 2 I-125 seeds placed in the upper outer aspect of the left breast also to mark more areas of atypical lobular hyperplasia.  The neoprobe was set to I-125 in the area of the 3 radioactive seeds was readily identified.  Attention was first turned to the medial one.  The area around this was infiltrated with quarter percent Marcaine.  An elliptical incision was made in the skin overlying the area of radioactivity as it was very shallow to the skin.  The incision was carried through the skin and subcutaneous tissue sharply with the electrocautery.  Dissection was carried widely around the radioactive seed while checking the area of radioactivity frequently.  Once the specimen was removed it was oriented with the appropriate paint colors.  A  specimen radiograph was obtained that showed the seed but no clip.  An additional deep margin was taken and marked appropriately and in this specimen the clip was identified.  This tissue was then sent to pathology for further evaluation.  Hemostasis was achieved using the Bovie electrocautery.  The deep layer of the wound was closed with interrupted 3-0 Vicryl stitches.  The skin was closed with a running 4-0 Monocryl subcuticular stitch.  Attention was then turned to the upper outer left breast.  I elected to make an elliptical incision in the skin overlying the 2 radioactive seeds.  The incision was carried through the skin and subcutaneous tissue sharply with the electrocautery.  Dissection was then carried widely around the 2 radioactive seeds while checking the area of radioactivity frequently.  Once the specimen was removed it was oriented with the appropriate paint colors.  A specimen radiograph was obtained that showed 2 clips and 2 seeds to be within the specimen.  The specimen was then sent to pathology for further evaluation.  Hemostasis was achieved using the Bovie electrocautery.  The wound was irrigated with saline and infiltrated with more quarter percent Marcaine.  The deep layer of the wound was then closed with layers of interrupted 3-0 Vicryl stitches.  The skin was closed with a running 4-0 Monocryl subcuticular stitch.  Dermabond dressings were applied.  The patient tolerated the procedure well.  At the end of the case all needle sponge and instrument counts were correct.  The patient  was then awakened and taken to recovery in stable condition.  PLAN OF CARE: Discharge to home after PACU  PATIENT DISPOSITION:  PACU - hemodynamically stable.   Delay start of Pharmacological VTE agent (>24hrs) due to surgical blood loss or risk of bleeding: not applicable

## 2021-01-06 ENCOUNTER — Encounter (HOSPITAL_BASED_OUTPATIENT_CLINIC_OR_DEPARTMENT_OTHER): Payer: Self-pay | Admitting: General Surgery

## 2021-01-10 LAB — SURGICAL PATHOLOGY

## 2021-01-17 ENCOUNTER — Encounter: Payer: Self-pay | Admitting: Hematology and Oncology

## 2021-01-17 ENCOUNTER — Other Ambulatory Visit: Payer: Self-pay

## 2021-01-17 ENCOUNTER — Inpatient Hospital Stay: Payer: Medicare HMO | Attending: Hematology and Oncology | Admitting: Hematology and Oncology

## 2021-01-17 DIAGNOSIS — N6092 Unspecified benign mammary dysplasia of left breast: Secondary | ICD-10-CM | POA: Insufficient documentation

## 2021-01-17 DIAGNOSIS — Z87891 Personal history of nicotine dependence: Secondary | ICD-10-CM | POA: Diagnosis not present

## 2021-01-17 NOTE — Progress Notes (Signed)
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 She refused endocrine therapy.  She underwent lumpectomy since her last visit and this showed atypical lobular hyperplasia, adenosis and fibroadenomatoid changes periductular chronic inflammation without any identifiable malignancy.  She is healing well from her lumpectomy.  She still does not want to consider endocrine therapy.  She is however amenable to following up in the high-risk breast clinic for every 37-month exams.  Today's exam shows postlumpectomy changes, no other concerns in the left breast.  Right breast not examined. She was encouraged to self breast exam and report any new changes or concerns to [place].   Role of MRIs for surveillance and atypical hyperplasia is unclear.  We will continue to do annual mammograms and follow-up.  She is scheduled for her annual mammogram in December.   With regards to bone density, we have discussed about considering calcium, vitamin D supplements.  She is reluctant about trying any of the supplements since she had some complications in the past.   Continue to follow-up with her primary care physician for bone density abnormalities.  HISTORY OF PRESENTING ILLNESS:   Connie Branch 70 y.o. female is here because of atypical lobular hyperplasia.  Oncology History   No history exists.    Connie Branch is a very pleasant 70 year old female patient with past medical  history significant for diabetes, peripheral neuropathy, osteoarthritis, hypertension referred to high-risk breast cancer clinic given her recent biopsy of left breast which demonstrated atypical lobular hyperplasia. During her last visit she became very anxious when we discussed anti estrogen therapy.  INTERIM HISTORY  Since she came in for her last visit she had left breast lumpectomy with radioactive seed localization. Lumpectomy showed atypical lobular hyperplasia, adenosis and fibroadenomatoid changes, periductular chronic inflammation no malignancy identified. She still does not want to consider endocrine therapy for atypical lobular hyperplasia.  She tells me that she is scared of the side effects. She is healing well from left breast lumpectomy. She does not want to take any calcium and vitamin D, she tells me that vitamin D caused hypercalcemia and complications with in the past. Rest of the pertinent 10 point ROS reviewed and negative.  MEDICAL HISTORY:  Past Medical History:  Diagnosis Date   Anxiety    panic attacks   ARF (acute renal failure) secondary to dehydration from osmotic diuresis 05/21/2013   KIDNEY FUNCTION NORMAL NOW   Arthritis    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    Cataracts, bilateral    DDD (degenerative disc disease)    DKA (diabetic ketoacidoses) 05/21/2013   TYPE 2   GERD (gastroesophageal reflux disease)    Headache    SINUS   High cholesterol    History of claustrophobia    Hypertension    Neuropathy 2015   feet   Obesity    Osteopenia    PONV (postoperative nausea and vomiting)     SURGICAL HISTORY: Past Surgical History:  Procedure Laterality Date   ABDOMINAL  HYSTERECTOMY     COMPLETE   BREAST EXCISIONAL BIOPSY Left 04/20/2020   ALH   BREAST LUMPECTOMY WITH RADIOACTIVE SEED LOCALIZATION Left 01/04/2021   Procedure: LEFT BREAST LUMPECTOMY WITH RADIOACTIVE SEED LOCALIZATION X 3;  Surgeon: Jovita Kussmaul, MD;  Location:  [facility];  Service: General;  Laterality: Left;   BREAST SURGERY Left 04/20/2020   BX   JOINT REPLACEMENT     KNEE JOINT MANIPULATION Right    OVARIAN CYST AND OVARY REMOVED  YRS AGO   TOTAL KNEE ARTHROPLASTY Right 06/21/2017   Procedure: RIGHT TOTAL KNEE ARTHROPLASTY;  Surgeon: Dorna Leitz, MD;  Location: [facility];  Service: Orthopedics;  Laterality: Right;   TOTAL KNEE ARTHROPLASTY Left 07/18/2020   Procedure: TOTAL KNEE ARTHROPLASTY;  Surgeon: Gaynelle Arabian, MD;  Location: [facility];  Service: Orthopedics;  Laterality: Left;  79min   TOTAL KNEE REVISION Right 07/22/2019   Procedure: Right knee polyethylene exchange;  Surgeon: Gaynelle Arabian, MD;  Location: [facility];  Service: Orthopedics;  Laterality: Right;  160min    SOCIAL HISTORY: Social History   Socioeconomic History   Marital status: Single    Spouse name: Not on file   Number of children: 0   Years of education: Not on file   Highest education level: Not on file  Occupational History   Occupation: Disabled.  Tobacco Use   Smoking status: Former    Packs/day: 0.25    Years: 50.00    Pack years: 12.50    Types: Cigarettes    Quit date: 05/07/2013    Years since quitting: 7.7   Smokeless tobacco: Never  Vaping Use   Vaping Use: Never used  Substance and Sexual Activity   Alcohol use: Not Currently    Comment: rare   Drug use: No   Sexual activity: Not Currently    Birth control/protection: Surgical  Other Topics Concern   Not on file  Social History Narrative   Single.  Lives with family.   Social Determinants of Health   Financial Resource Strain: Not on file  Food Insecurity: Not on file  Transportation Needs: Not on file  Physical Activity: Not on file  Stress: Not on file  Social Connections: Not on file  Intimate Partner Violence: Not on file    FAMILY HISTORY: Family History  Problem Relation Age of Onset   Breast cancer Neg Hx     ALLERGIES:  is allergic to erythromycin,  aspirin, and penicillins.  MEDICATIONS:  Current Outpatient Medications  Medication Sig Dispense Refill   acetaminophen (TYLENOL) 500 MG tablet Take 1,000 mg by mouth every 6 (six) hours as needed for moderate pain.     Ascorbic Acid (VITAMIN C) 1000 MG tablet Take 1,000 mg by mouth daily.     Blood Glucose Monitoring Suppl (BLOOD GLUCOSE METER) kit Use as instructed 1 each 0   gabapentin (NEURONTIN) 300 MG capsule Take 300 mg by mouth 2 (two) times daily.     hydrochlorothiazide (HYDRODIURIL) 25 MG tablet Take 1 tablet (25 mg total) by mouth daily.     KLOR-CON M20 20 MEQ tablet Take 40 mEq by mouth daily.     losartan (COZAAR) 100 MG tablet Take 100 mg by mouth daily.     metFORMIN (GLUCOPHAGE) 500 MG tablet Take 500 mg by mouth 2 (two) times daily with a meal.  0   methocarbamol (ROBAXIN) 500 MG tablet Take 1 tablet (500 mg total) by mouth every 6 (six) hours as needed for  muscle spasms. 40 tablet 0   omeprazole (PRILOSEC) 40 MG capsule Take 40 mg by mouth daily.  1   simvastatin (ZOCOR) 40 MG tablet Take 40 mg by mouth daily.  3   traMADol (ULTRAM) 50 MG tablet Take 1 tablet (50 mg total) by mouth every 6 (six) hours as needed. 20 tablet 0   No current facility-administered medications for this visit.    PHYSICAL EXAMINATION:  ECOG PERFORMANCE STATUS: 0 - Asymptomatic  Vitals:   01/17/21 1255  BP: 135/84  Pulse: 73  Resp: 19  Temp: 97.9 F (36.6 C)  SpO2: 100%    GENERAL:alert, no distress and comfortable Breast: Left breast status postlumpectomy.  Postop seroma noted.  Otherwise well-healing.  LABORATORY DATA:  I have reviewed the data as listed Lab Results  Component Value Date   WBC 4.6 11/10/2020   HGB 10.9 (L) 11/10/2020   HCT 34.0 (L) 11/10/2020   MCV 91.9 11/10/2020   PLT 121 (L) 11/10/2020     Chemistry      Component Value Date/Time   NA 140 01/02/2021 0830   K 3.6 01/02/2021 0830   CL 106 01/02/2021 0830   CO2 26 01/02/2021 0830   BUN 14 01/02/2021  0830   CREATININE 0.73 01/02/2021 0830      Component Value Date/Time   CALCIUM 9.3 01/02/2021 0830   ALKPHOS 78 08/20/2020 1332   AST 16 08/20/2020 1332   ALT 11 08/20/2020 1332   BILITOT 0.7 08/20/2020 1332     FINAL MICROSCOPIC DIAGNOSIS:   A. BREAST, LEFT UPPER OUTER, LUMPECTOMY:  -  Lobular neoplasia (atypical lobular hyperplasia)  -  Adenosis and fibroadenomatoid changes  -  Periductular chronic inflammation  -  Previous biopsy site changes  -  No malignancy identified  -  See comment   B. BREAST, LEFT MEDIAL, LUMPECTOMY:  -  Lobular neoplasia (atypical lobular hyperplasia)  -  No malignancy identified   C. BREAST, LEFT ADDITIONAL DEEP MARGIN, EXCISION:  -  Lobular neoplasia (atypical lobular hyperplasia)  -  Fibroadenomatoid and fibrocystic changes with calcifications  -  No malignancy identified  -  See comment   RADIOGRAPHIC STUDIES: I have personally reviewed the radiological images as listed and agreed with the findings in the report. MM Breast Surgical Specimen  Result Date: 01/04/2021 CLINICAL DATA:  Evaluate specimen EXAM: SPECIMEN RADIOGRAPH OF THE LEFT BREAST COMPARISON:  Previous exam(s). FINDINGS: Status post excision of the left breast. The radioactive seed and biopsy marker clip are present, completely intact, and were marked for pathology. IMPRESSION: Specimen radiograph of the left breast. Electronically Signed   By: Dorise Bullion III M.D.   On: 01/04/2021 10:21  MM Breast Surgical Specimen  Result Date: 01/04/2021 CLINICAL DATA:  Evaluate specimen EXAM: SPECIMEN RADIOGRAPH OF THE LEFT BREAST COMPARISON:  Previous exam(s). FINDINGS: Status post excision of the left breast. The ribbon shaped clip and adjacent radioactive seed as well as the coil shaped clip and adjacent radioactive seed are present, completely intact, and were marked for pathology. IMPRESSION: Specimen radiograph of the left breast. Electronically Signed   By: Dorise Bullion III M.D.    On: 01/04/2021 09:36  MM LT RADIOACTIVE SEED LOC MAMMO GUIDE  Result Date: 01/03/2021 CLINICAL DATA:  ALH.  Localization prior to surgery. EXAM: MAMMOGRAPHIC GUIDED RADIOACTIVE SEED LOCALIZATION OF THE LEFT BREAST COMPARISON:  Previous exam(s). FINDINGS: Patient presents for radioactive seed localization prior to surgery. I met with the patient and we discussed the procedure of  seed localization including benefits and alternatives. We discussed the high likelihood of a successful procedure. We discussed the risks of the procedure including infection, bleeding, tissue injury and further surgery. We discussed the low dose of radioactivity involved in the procedure. Informed, written consent was given. The usual time-out protocol was performed immediately prior to the procedure. Using mammographic guidance, sterile technique, 1% lidocaine and an I-125 radioactive seed, the medial X shaped clip was localized using a medial approach. The follow-up mammogram images confirm the seed in the expected location and were marked for surgeon. Follow-up survey of the patient confirms presence of the radioactive seed. Order number of I-125 seed:  [id number]. Total activity:  2.035 millicuries reference Date: December 26, 2020 Using mammographic guidance, sterile technique, 1% lidocaine and an I-125 radioactive seed, the ribbon shaped clip was localized using a superior approach. The follow-up mammogram images confirm the seed in the expected location and were marked for surgeon. Follow-up survey of the patient confirms presence of the radioactive seed. Order number of I-125 seed:  [id number]. Total activity:  5.364 millicuries reference Date: December 26, 2020 Using mammographic guidance, sterile technique, 1% lidocaine and an I-125 radioactive seed, the coil shaped clip was localized using a superior approach. The follow-up mammogram images confirm the seed in the expected location and were marked for surgeon. Follow-up survey of  the patient confirms presence of the radioactive seed. Order number of I-125 seed:  [id number]. Total activity:  8.250 millicuries reference Date: December 26, 2020 The patient tolerated the procedure well and was released from the [facility]. She was given instructions regarding seed removal. IMPRESSION: Radioactive seed localization left breast. No apparent complications. Electronically Signed   By: Dorise Bullion III M.D.   On: 01/03/2021 14:32  MM LT RADIOACTIVE SEED LOC MAMMO GUIDE  Result Date: 01/03/2021 CLINICAL DATA:  ALH.  Localization prior to surgery. EXAM: MAMMOGRAPHIC GUIDED RADIOACTIVE SEED LOCALIZATION OF THE LEFT BREAST COMPARISON:  Previous exam(s). FINDINGS: Patient presents for radioactive seed localization prior to surgery. I met with the patient and we discussed the procedure of seed localization including benefits and alternatives. We discussed the high likelihood of a successful procedure. We discussed the risks of the procedure including infection, bleeding, tissue injury and further surgery. We discussed the low dose of radioactivity involved in the procedure. Informed, written consent was given. The usual time-out protocol was performed immediately prior to the procedure. Using mammographic guidance, sterile technique, 1% lidocaine and an I-125 radioactive seed, the medial X shaped clip was localized using a medial approach. The follow-up mammogram images confirm the seed in the expected location and were marked for surgeon. Follow-up survey of the patient confirms presence of the radioactive seed. Order number of I-125 seed:  [id number]. Total activity:  1.694 millicuries reference Date: December 26, 2020 Using mammographic guidance, sterile technique, 1% lidocaine and an I-125 radioactive seed, the ribbon shaped clip was localized using a superior approach. The follow-up mammogram images confirm the seed in the expected location and were marked for surgeon. Follow-up survey of the  patient confirms presence of the radioactive seed. Order number of I-125 seed:  [id number]. Total activity:  0.349 millicuries reference Date: December 26, 2020 Using mammographic guidance, sterile technique, 1% lidocaine and an I-125 radioactive seed, the coil shaped clip was localized using a superior approach. The follow-up mammogram images confirm the seed in the expected location and were marked for surgeon. Follow-up survey of the patient confirms presence of the radioactive seed. Order number of  I-125 seed:  [id number]. Total activity:  8.144 millicuries reference Date: December 26, 2020 The patient tolerated the procedure well and was released from the [facility]. She was given instructions regarding seed removal. IMPRESSION: Radioactive seed localization left breast. No apparent complications. Electronically Signed   By: Dorise Bullion III M.D.   On: 01/03/2021 14:32  MM LT RAD SEED EA ADD LESION LOC MAMMO  Result Date: 01/03/2021 CLINICAL DATA:  ALH.  Localization prior to surgery. EXAM: MAMMOGRAPHIC GUIDED RADIOACTIVE SEED LOCALIZATION OF THE LEFT BREAST COMPARISON:  Previous exam(s). FINDINGS: Patient presents for radioactive seed localization prior to surgery. I met with the patient and we discussed the procedure of seed localization including benefits and alternatives. We discussed the high likelihood of a successful procedure. We discussed the risks of the procedure including infection, bleeding, tissue injury and further surgery. We discussed the low dose of radioactivity involved in the procedure. Informed, written consent was given. The usual time-out protocol was performed immediately prior to the procedure. Using mammographic guidance, sterile technique, 1% lidocaine and an I-125 radioactive seed, the medial X shaped clip was localized using a medial approach. The follow-up mammogram images confirm the seed in the expected location and were marked for surgeon. Follow-up survey of the patient  confirms presence of the radioactive seed. Order number of I-125 seed:  [id number]. Total activity:  7.026 millicuries reference Date: December 26, 2020 Using mammographic guidance, sterile technique, 1% lidocaine and an I-125 radioactive seed, the ribbon shaped clip was localized using a superior approach. The follow-up mammogram images confirm the seed in the expected location and were marked for surgeon. Follow-up survey of the patient confirms presence of the radioactive seed. Order number of I-125 seed:  [id number]. Total activity:  7.741 millicuries reference Date: December 26, 2020 Using mammographic guidance, sterile technique, 1% lidocaine and an I-125 radioactive seed, the coil shaped clip was localized using a superior approach. The follow-up mammogram images confirm the seed in the expected location and were marked for surgeon. Follow-up survey of the patient confirms presence of the radioactive seed. Order number of I-125 seed:  [id number]. Total activity:  0.947 millicuries reference Date: December 26, 2020 The patient tolerated the procedure well and was released from the [facility]. She was given instructions regarding seed removal. IMPRESSION: Radioactive seed localization left breast. No apparent complications. Electronically Signed   By: Dorise Bullion III M.D.   On: 01/03/2021 14:32    All questions were answered. The patient knows to call the clinic with any problems, questions or concerns.     Benay Pike, MD 01/17/2021 1:35 PM

## 2021-01-17 NOTE — Assessment & Plan Note (Signed)
Atypical lobular hyperplasia noted on left breast needle core biopsy,  We have discussed that atypical lobular hyperplasia is a pre precancerous condition and increases the risk of breast cancer by 3-5 times and hence she was referred to high risk breast cancer clinic for consideration of chemoprevention with tamoxifen or aromatase inhibitor. She refused endocrine therapy.  She underwent lumpectomy since her last visit and this showed atypical lobular hyperplasia, adenosis and fibroadenomatoid changes periductular chronic inflammation without any identifiable malignancy.  She is healing well from her lumpectomy.  She still does not want to consider endocrine therapy.  She is however amenable to following up in the high-risk breast clinic for every 74-month exams.  Today's exam shows postlumpectomy changes, no other concerns in the left breast.  Right breast not examined. She was encouraged to self breast exam and report any new changes or concerns to [place].   Role of MRIs for surveillance and atypical hyperplasia is unclear.  We will continue to do annual mammograms and follow-up.  She is scheduled for her annual mammogram in December.

## 2021-02-07 ENCOUNTER — Ambulatory Visit: Payer: Medicare HMO | Admitting: Hematology and Oncology

## 2021-02-08 ENCOUNTER — Encounter (HOSPITAL_BASED_OUTPATIENT_CLINIC_OR_DEPARTMENT_OTHER): Payer: Self-pay | Admitting: Physical Therapy

## 2021-02-08 ENCOUNTER — Ambulatory Visit (HOSPITAL_BASED_OUTPATIENT_CLINIC_OR_DEPARTMENT_OTHER): Payer: Medicare HMO | Attending: Orthopedic Surgery | Admitting: Physical Therapy

## 2021-02-08 ENCOUNTER — Other Ambulatory Visit: Payer: Self-pay

## 2021-02-08 DIAGNOSIS — D649 Anemia, unspecified: Secondary | ICD-10-CM | POA: Diagnosis not present

## 2021-02-08 DIAGNOSIS — I10 Essential (primary) hypertension: Secondary | ICD-10-CM | POA: Diagnosis not present

## 2021-02-08 DIAGNOSIS — M1990 Unspecified osteoarthritis, unspecified site: Secondary | ICD-10-CM | POA: Insufficient documentation

## 2021-02-08 DIAGNOSIS — Z96651 Presence of right artificial knee joint: Secondary | ICD-10-CM | POA: Diagnosis not present

## 2021-02-08 DIAGNOSIS — M6281 Muscle weakness (generalized): Secondary | ICD-10-CM | POA: Insufficient documentation

## 2021-02-08 DIAGNOSIS — E119 Type 2 diabetes mellitus without complications: Secondary | ICD-10-CM | POA: Diagnosis not present

## 2021-02-08 DIAGNOSIS — M25562 Pain in left knee: Secondary | ICD-10-CM | POA: Diagnosis not present

## 2021-02-08 DIAGNOSIS — Z96652 Presence of left artificial knee joint: Secondary | ICD-10-CM | POA: Insufficient documentation

## 2021-02-08 DIAGNOSIS — K5792 Diverticulitis of intestine, part unspecified, without perforation or abscess without bleeding: Secondary | ICD-10-CM | POA: Diagnosis not present

## 2021-02-08 DIAGNOSIS — M25561 Pain in right knee: Secondary | ICD-10-CM | POA: Insufficient documentation

## 2021-02-08 DIAGNOSIS — E669 Obesity, unspecified: Secondary | ICD-10-CM | POA: Diagnosis not present

## 2021-02-08 DIAGNOSIS — R262 Difficulty in walking, not elsewhere classified: Secondary | ICD-10-CM | POA: Diagnosis not present

## 2021-02-08 NOTE — Therapy (Signed)
OUTPATIENT PHYSICAL THERAPY LOWER EXTREMITY EVALUATION   Patient Name: Connie Branch MRN: [id number] DOB:[birth year], 70 y.o., female Today's Date: 02/08/2021   PT End of Session - 02/08/21 1356     Visit Number 1    Number of Visits 16    Date for PT Re-Evaluation 05/09/21    Authorization Type [employer]    PT Start Time 1345    PT Stop Time 1430    PT Time Calculation (min) 45 min    Equipment Utilized During Treatment Other (comment) RW   Activity Tolerance Patient tolerated treatment well    Behavior During Therapy WFL for tasks assessed/performed             Past Medical History:  Diagnosis Date   Anxiety    panic attacks   ARF (acute renal failure) secondary to dehydration from osmotic diuresis 05/21/2013   KIDNEY FUNCTION NORMAL NOW   Arthritis    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    Cataracts, bilateral    DDD (degenerative disc disease)    DKA (diabetic ketoacidoses) 05/21/2013   TYPE 2   GERD (gastroesophageal reflux disease)    Headache    SINUS   High cholesterol    History of claustrophobia    Hypertension    Neuropathy 2015   feet   Obesity    Osteopenia    PONV (postoperative nausea and vomiting)    Past Surgical History:  Procedure Laterality Date   ABDOMINAL HYSTERECTOMY     COMPLETE   BREAST EXCISIONAL BIOPSY Left 04/20/2020   ALH   BREAST LUMPECTOMY WITH RADIOACTIVE SEED LOCALIZATION Left 01/04/2021   Procedure: LEFT BREAST LUMPECTOMY WITH RADIOACTIVE SEED LOCALIZATION X 3;  Surgeon: Griselda Miner, MD;  Location: [facility];  Service: General;  Laterality: Left;   BREAST SURGERY Left 04/20/2020   BX   JOINT REPLACEMENT     KNEE JOINT MANIPULATION Right    OVARIAN CYST AND OVARY REMOVED  YRS AGO   TOTAL KNEE ARTHROPLASTY Right 06/21/2017   Procedure: RIGHT TOTAL KNEE ARTHROPLASTY;  Surgeon: Jodi Geralds, MD;  Location: [facility];  Service: Orthopedics;  Laterality: Right;   TOTAL KNEE ARTHROPLASTY  Left 07/18/2020   Procedure: TOTAL KNEE ARTHROPLASTY;  Surgeon: Ollen Gross, MD;  Location: [facility];  Service: Orthopedics;  Laterality: Left;    TOTAL KNEE REVISION Right 07/22/2019   Procedure: Right knee polyethylene exchange;  Surgeon: Ollen Gross, MD;  Location: [facility];  Service: Orthopedics;  Laterality: Right;    Patient Active Problem List   Diagnosis Date Noted   Atypical lobular hyperplasia [facility]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 09/13/2020   Acute diverticulitis 08/15/2020   Hypomagnesemia 08/15/2020   Hypocalcemia 08/15/2020   Type 2 diabetes mellitus with hyperlipidemia (HCC) 08/15/2020   Anemia 08/15/2020   Failed total right knee replacement (HCC) 07/22/2019   Postoperative anemia due to acute blood loss 06/24/2017   Primary osteoarthritis of right knee 06/21/2017   Primary osteoarthritis of left knee 06/21/2017   DM (diabetes mellitus), secondary, uncontrolled, with ketoacidosis (HCC) 05/23/2013   Hypokalemia 05/22/2013   Obesity (BMI 30-39.9) 05/22/2013   DKA (diabetic ketoacidoses) 05/21/2013   Muscle spasm 05/21/2013   Hyponatremia 05/21/2013   ARF (acute renal failure) secondary to dehydration from osmotic diuresis 05/21/2013   HTN (hypertension) 05/21/2013   Hyperlipidemia 05/21/2013   GERD (gastroesophageal reflux disease) 05/21/2013   Infection due to trichomonas 05/21/2013    PCP: Sigmund Hazel, MD  REFERRING PROVIDER: Aluisio,  Homero Fellers, MD  REFERRING DIAG: Z02.585 (ICD-10-CM) - Presence of left artificial knee joint   THERAPY DIAG:  Pain in joint of right knee  Pain in joint of left knee  Muscle weakness (generalized)  Difficulty walking  ONSET DATE: March 14th 2022 L TKA   SUBJECTIVE:   SUBJECTIVE STATEMENT: Pt states the L knee is weak. She states he is here for aquatic therapy. She is unable to walk with cane and has to walk with a walker. She "has a problem with the hamstring that is always swollen." She states she has difficulty walking down  the steps. She has had 6 months of PT at [facility]. Pt states the L knee is extremely stiff. She can only stand about 15 minutes and pain shoots down the L knee. Pt states she was doing leg press, exercise biking, and balance exercises at previous PT. Pt states she has not been continuing to do HEP. She was going to the [facility] and using the bike. She would do the bike for and walk in the pool. She does not perform other forms of exercise. Pt denies cancer red flags or signs of infection.   She did do some aquatic therapy at [facility] for the R knee revision.   PERTINENT HISTORY: R TKA in 2019 or 2020 and revision done March 2021 in R, peripheral neuropathy, DDD, HTN   PAIN:  Are you having pain? No  Pain location: L anterior and posterior knee Pain orientation: Left  PAIN TYPE: stiffness, sharp, and throbbing Pain description: intermittent  Aggravating factors: standing for too long, walking up ramps, steps/stairs, laying still for too long  Relieving factors: resting, meds,  PRECAUTIONS: None  WEIGHT BEARING RESTRICTIONS No  FALLS:  Has patient fallen in last 6 months? No,  LIVING ENVIRONMENT: Lives with: lives with their family Lives in: House/apartment Stairs: Yes; Has following equipment at home: Single point cane, Environmental consultant - 2 wheeled, and Grab bars   PATIENT GOALS Walk without any sort of AD and reduce the pain   OBJECTIVE:   DIAGNOSTIC FINDINGS: No imaging shown in record  PATIENT SURVEYS:  LEFS 38 / 80 = 47.5 %  COGNITION:  Overall cognitive status: Within functional limits for tasks assessed     SENSATION:  Light touch: Appears intact  MUSCLE LENGTH: L HS limited in 90/90 >30 deg Significant quadriceps tightness in seated   POSTURE:  R shift in standing, L hip rests in slight IR  LE AROM/PROM:  A/PROM Right 02/08/2021 Left 02/08/2021  Knee flexion 126 112  Knee extension 0 -2   (Blank rows = not tested)  LE MMT:  MMT  Right 02/08/2021 Left 02/08/2021  Hip extension 4+/5 4/5  Hip abduction 4/5 4/5  Knee flexion 4+/5 4/5  Knee extension 4+/5 4/5   (Blank rows = not tested)   JOINT MOBILITY ASSESSMENT:  Decreased tibial glide into flexion and ext Limited patellar mobility in all planes  FUNCTIONAL TESTS:  5 times sit to stand: 34.1s  GAIT: Distance walked: 82ft Assistive device utilized: Environmental consultant - 2 wheeled Level of assistance: Modified independence Comments: shuffling gait, fwd lean, lateral lean away from L, decreased L stance time  SPECIAL TEST: (-) Varus/valgus  Significant quad atrophy on L as compared to R  TODAY'S TREATMENT:  Exercises Supine Bridge - 2 x daily - 7 x weekly - 3 sets - 10 reps Seated Long Arc Quad - 2 x daily - 7 x weekly - 3 sets - 10  reps Seated Ankle Dorsiflexion Stretch - 2 x daily - 7 x weekly - 1 sets - 10 reps - 10 hold    PATIENT EDUCATION:  Education details: MOI, diagnosis, prognosis, anatomy, exercise progression, DOMS expectations, muscle firing,  envelope of function, HEP, POC  Person educated: Patient Education method: Explanation, Demonstration, Tactile cues, Verbal cues, and Handouts Education comprehension: verbalized understanding, returned demonstration, verbal cues required, tactile cues required, and needs further education   HOME EXERCISE PROGRAM: Access Code: [email or web address] URL: [email or web address] Date: 02/08/2021 Prepared by: Zebedee Iba    ASSESSMENT:  CLINICAL IMPRESSION: Patient is a 70 y.o. female who was seen today for physical therapy evaluation and treatment for CC of L knee pain. Pt's s/s appear consistent with L knee pain and weakness due to muscle atrophy and joint stiffness following TKA. Objective impairments include Abnormal gait, decreased activity tolerance, decreased balance, decreased endurance, decreased knowledge of condition, decreased mobility, difficulty walking, decreased ROM, decreased strength,  hypomobility, increased fascial restrictions, impaired flexibility, improper body mechanics, and pain. These impairments are limiting patient from cleaning, community activity, driving, and shopping. Personal factors including Age, Behavior pattern, Past/current experiences, Social background, Time since onset of injury/illness/exacerbation, and 1-2 comorbidities:    are also affecting patient's functional outcome. Patient will benefit from skilled PT to address above impairments and improve overall function.  REHAB POTENTIAL: Fair    CLINICAL DECISION MAKING: Stable/uncomplicated  EVALUATION COMPLEXITY: Low   GOALS:   SHORT TERM GOALS:  STG Name Target Date Goal status  1 Pt will become independent with HEP in order to demonstrate synthesis of PT education.  :  02/22/2021 INITIAL  2 Pt will have an at least 9 pt improvement in LEFS measure in order to demonstrate MCID improvement in daily function.  :  03/08/2021 INITIAL  3 Pt will report at least 2 pt reduction on VAS scale for pain in order to demonstrate functional improvement with household activity, self care, and ADL.  : 03/08/2021 INITIAL   LONG TERM GOALS:   LTG Name Target Date Goal status  1 Pt  will become independent with final HEP in order to demonstrate synthesis of PT education.   04/05/2021 INITIAL  2 Pt will be able to demonstrate/report ability to walk >15s mins without pain in order to demonstrate functional improvement and tolerance to exercise and community mobility.  04/05/2021 INITIAL  3 Pt will be able to demonstrate/report ability to sit/stand for >25 min periods of time without pain in order to demonstrate functional improvement and tolerance to static positioning.   04/05/2021 INITIAL  4 Pt will be able to demonstrate ability to walk without AD in order to demonstrate functional improvement in LE function for self-care and house hold duties.   04/05/2021 INITIAL   PLAN: PT FREQUENCY: 2x/week  PT  DURATION: 8 weeks  PLANNED INTERVENTIONS: Therapeutic exercises, Therapeutic activity, Neuro Muscular re-education, Balance training, Gait training, Patient/Family education, Joint mobilization, Stair training, Orthotic/Fit training, Aquatic Therapy, Dry Needling, Electrical stimulation, Spinal mobilization, Cryotherapy, Moist heat, scar mobilization, Taping, Vasopneumatic device, Traction, Ultrasound, Ionotophoresis 4mg /ml Dexamethasone, and Manual therapy  PLAN FOR NEXT SESSION: introduce aquatic, knee flexion stretching, quad strength   PT, DPT 02/08/21 6:04 PM

## 2021-02-13 ENCOUNTER — Ambulatory Visit (HOSPITAL_BASED_OUTPATIENT_CLINIC_OR_DEPARTMENT_OTHER): Payer: Medicare HMO | Admitting: Physical Therapy

## 2021-02-13 ENCOUNTER — Encounter (HOSPITAL_BASED_OUTPATIENT_CLINIC_OR_DEPARTMENT_OTHER): Payer: Self-pay | Admitting: Physical Therapy

## 2021-02-13 ENCOUNTER — Other Ambulatory Visit: Payer: Self-pay

## 2021-02-13 DIAGNOSIS — R262 Difficulty in walking, not elsewhere classified: Secondary | ICD-10-CM

## 2021-02-13 DIAGNOSIS — M6281 Muscle weakness (generalized): Secondary | ICD-10-CM

## 2021-02-13 DIAGNOSIS — M25561 Pain in right knee: Secondary | ICD-10-CM

## 2021-02-13 DIAGNOSIS — M25562 Pain in left knee: Secondary | ICD-10-CM

## 2021-02-13 NOTE — Therapy (Signed)
OUTPATIENT PHYSICAL THERAPY TREATMENT NOTE   Patient Name: Connie Branch MRN: [id number] DOB:November 04, 1950, 70 y.o., female Today's Date: 02/13/2021  PCP: Sigmund Hazel, MD REFERRING PROVIDER: Sigmund Hazel, MD   PT End of Session - 02/13/21 0919     Visit Number 2    Number of Visits 16    Date for PT Re-Evaluation 05/09/21    Authorization Type [employer] Medicare    PT Start Time 0930    PT Stop Time 1010    PT Time Calculation (min) 40 min    Equipment Utilized During Treatment Other (comment)    Activity Tolerance Patient tolerated treatment well    Behavior During Therapy [facility] for tasks assessed/performed             Past Medical History:  Diagnosis Date   Anxiety    panic attacks   ARF (acute renal failure) secondary to dehydration from osmotic diuresis 05/21/2013   KIDNEY FUNCTION NORMAL NOW   Arthritis    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    Cataracts, bilateral    DDD (degenerative disc disease)    DKA (diabetic ketoacidoses) 05/21/2013   TYPE 2   GERD (gastroesophageal reflux disease)    Headache    SINUS   High cholesterol    History of claustrophobia    Hypertension    Neuropathy 2015   feet   Obesity    Osteopenia    PONV (postoperative nausea and vomiting)    Past Surgical History:  Procedure Laterality Date   ABDOMINAL HYSTERECTOMY     COMPLETE   BREAST EXCISIONAL BIOPSY Left 04/20/2020   ALH   BREAST LUMPECTOMY WITH RADIOACTIVE SEED LOCALIZATION Left 01/04/2021   Procedure: LEFT BREAST LUMPECTOMY WITH RADIOACTIVE SEED LOCALIZATION X 3;  Surgeon: Griselda Miner, MD;  Location: [facility];  Service: General;  Laterality: Left;   BREAST SURGERY Left 04/20/2020   BX   JOINT REPLACEMENT     KNEE JOINT MANIPULATION Right    OVARIAN CYST AND OVARY REMOVED  YRS AGO   TOTAL KNEE ARTHROPLASTY Right 06/21/2017   Procedure: RIGHT TOTAL KNEE ARTHROPLASTY;  Surgeon: Jodi Geralds, MD;  Location: [facility];  Service: Orthopedics;   Laterality: Right;   TOTAL KNEE ARTHROPLASTY Left 07/18/2020   Procedure: TOTAL KNEE ARTHROPLASTY;  Surgeon: Ollen Gross, MD;  Location: [facility];  Service: Orthopedics;  Laterality: Left;    TOTAL KNEE REVISION Right 07/22/2019   Procedure: Right knee polyethylene exchange;  Surgeon: Ollen Gross, MD;  Location: [facility];  Service: Orthopedics;  Laterality: Right;    Patient Active Problem List   Diagnosis Date Noted   Atypical lobular hyperplasia [facility]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 09/13/2020   Acute diverticulitis 08/15/2020   Hypomagnesemia 08/15/2020   Hypocalcemia 08/15/2020   Type 2 diabetes mellitus with hyperlipidemia (HCC) 08/15/2020   Anemia 08/15/2020   Failed total right knee replacement (HCC) 07/22/2019   Postoperative anemia due to acute blood loss 06/24/2017   Primary osteoarthritis of right knee 06/21/2017   Primary osteoarthritis of left knee 06/21/2017   DM (diabetes mellitus), secondary, uncontrolled, with ketoacidosis (HCC) 05/23/2013   Hypokalemia 05/22/2013   Obesity (BMI 30-39.9) 05/22/2013   DKA (diabetic ketoacidoses) 05/21/2013   Muscle spasm 05/21/2013   Hyponatremia 05/21/2013   ARF (acute renal failure) secondary to dehydration from osmotic diuresis 05/21/2013   HTN (hypertension) 05/21/2013   Hyperlipidemia 05/21/2013   GERD (gastroesophageal reflux disease) 05/21/2013   Infection due to trichomonas 05/21/2013  REFERRING DIAG: O97.353 (ICD-10-CM) - Presence of left artificial knee joint   THERAPY DIAG:  Pain in joint of left knee  Pain in joint of right knee  Muscle weakness (generalized)  Difficulty walking  PERTINENT HISTORY: PERTINENT HISTORY: R TKA in 2019 or 2020 and revision done March 2021 in R, peripheral neuropathy, DDD, HTN   PRECAUTIONS: N/A  SUBJECTIVE: Pt states her knee is a little stiff from the exercise.   PAIN:  Are you having pain? No     OBJECTIVE:   TODAY'S TREATMENT: Pt seen for aquatic therapy today.   Treatment took place in water 3.25-4 ft in depth at the [facility] pool. Temp of water was 94 deg.  Pt entered/exited the pool via stairs (step to pattern) with bilat rail and assistive device (walker/rolling cart) once getting out of pool.     Warm up: waist deep fwd and back, marching sidestepping 4x (yellow DB support bilat)     Exercises: standing march 20x STS 20x Fwd step up 10x, attempted lateral but too much UE and hip compensation [place]quat at edge of pool 20x Stair step flexion stretch 10s 10x    Pt requires buoyancy for support and to offload joints with strengthening exercises. Viscosity of the water is needed for resistance of strengthening; water current perturbations provides challenge to standing balance unsupported, requiring increased core activation.  PATIENT EDUCATION:  Education details: anatomy, exercise progression, DOMS expectations, muscle firing,  envelope of function, HEP, POC   Person educated: Patient Education method: Explanation, Demonstration, Tactile cues, Verbal cues, and Handouts Education comprehension: verbalized understanding, returned demonstration, verbal cues required, tactile cues required, and needs further education     HOME EXERCISE PROGRAM: Access Code: [email or web address] URL: [email or web address] Date: 02/08/2021 Prepared by: Zebedee Iba       ASSESSMENT:   CLINICAL IMPRESSION: Pt with good tolerance to aquatic introduction and exercise but very apprehensive in the aquatic setting. Pt required bilat UE support for walking for confidence vs a physical inability to perform water movements. Pt without pain throughout setting and was able to improve L knee flexion at end of session to roughly 120 upon observation. Pt had good demonstration of stair mechanics in 50% BW environment and was able to demonstrate step to pattern at end of session for ascended and descend. Plan to continue with aquatic knee ROM and strength. Pt  would benefit from continued skilled therapy in order to reach goals and maximize functional bilat L strength and ROM for full return to PLOF.    REHAB POTENTIAL: Fair     CLINICAL DECISION MAKING: Stable/uncomplicated   EVALUATION COMPLEXITY: Low     GOALS:     SHORT TERM GOALS:   STG Name Target Date Goal status  1 Pt will become independent with HEP in order to demonstrate synthesis of PT education.   :  02/22/2021 INITIAL  2 Pt will have an at least 9 pt improvement in LEFS measure in order to demonstrate MCID improvement in daily function.   :  03/08/2021 INITIAL  3 Pt will report at least 2 pt reduction on VAS scale for pain in order to demonstrate functional improvement with household activity, self care, and ADL.   : 03/08/2021 INITIAL    LONG TERM GOALS:    LTG Name Target Date Goal status  1 Pt  will become independent with final HEP in order to demonstrate synthesis of PT education.     04/05/2021 INITIAL  2 Pt will be able to  demonstrate/report ability to walk >15s mins without pain in order to demonstrate functional improvement and tolerance to exercise and community mobility.   04/05/2021 INITIAL  3 Pt will be able to demonstrate/report ability to sit/stand for >25 min periods of time without pain in order to demonstrate functional improvement and tolerance to static positioning.    04/05/2021 INITIAL  4 Pt will be able to demonstrate ability to walk without AD in order to demonstrate functional improvement in LE function for self-care and house hold duties.     04/05/2021 INITIAL    PLAN: PT FREQUENCY: 2x/week   PT DURATION: 8 weeks   PLANNED INTERVENTIONS: Therapeutic exercises, Therapeutic activity, Neuro Muscular re-education, Balance training, Gait training, Patient/Family education, Joint mobilization, Stair training, Orthotic/Fit training, Aquatic Therapy, Dry Needling, Electrical stimulation, Spinal mobilization, Cryotherapy, Moist heat, scar  mobilization, Taping, Vasopneumatic device, Traction, Ultrasound, Ionotophoresis 4mg /ml Dexamethasone, and Manual therapy   PLAN FOR NEXT SESSION: knee flexion stretching, quad strength     PT, DPT 02/13/21 12:37 PM

## 2021-02-16 ENCOUNTER — Ambulatory Visit (HOSPITAL_BASED_OUTPATIENT_CLINIC_OR_DEPARTMENT_OTHER): Payer: Medicare HMO | Admitting: Physical Therapy

## 2021-02-16 ENCOUNTER — Other Ambulatory Visit: Payer: Self-pay

## 2021-02-16 ENCOUNTER — Encounter (HOSPITAL_BASED_OUTPATIENT_CLINIC_OR_DEPARTMENT_OTHER): Payer: Self-pay | Admitting: Physical Therapy

## 2021-02-16 DIAGNOSIS — R262 Difficulty in walking, not elsewhere classified: Secondary | ICD-10-CM

## 2021-02-16 DIAGNOSIS — M25562 Pain in left knee: Secondary | ICD-10-CM

## 2021-02-16 DIAGNOSIS — M25561 Pain in right knee: Secondary | ICD-10-CM

## 2021-02-16 DIAGNOSIS — M6281 Muscle weakness (generalized): Secondary | ICD-10-CM

## 2021-02-16 NOTE — Therapy (Signed)
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    Cataracts, bilateral    DDD (degenerative disc disease)    DKA (diabetic ketoacidoses) 05/21/2013   TYPE 2   GERD (gastroesophageal reflux disease)    Headache    SINUS   High cholesterol    History of claustrophobia    Hypertension    Neuropathy 2015   feet   Obesity    Osteopenia    PONV (postoperative nausea and vomiting)    Past Surgical History:  Procedure Laterality Date   ABDOMINAL HYSTERECTOMY     COMPLETE   BREAST EXCISIONAL BIOPSY Left 04/20/2020   ALH   BREAST LUMPECTOMY WITH RADIOACTIVE SEED LOCALIZATION Left 01/04/2021   Procedure: LEFT BREAST LUMPECTOMY WITH RADIOACTIVE SEED LOCALIZATION X 3;  Surgeon: Griselda Miner, MD;  Location: [facility];  Service: General;  Laterality: Left;   BREAST SURGERY Left 04/20/2020   BX   JOINT REPLACEMENT     KNEE JOINT MANIPULATION Right    OVARIAN CYST AND OVARY REMOVED  YRS AGO   TOTAL KNEE ARTHROPLASTY Right 06/21/2017   Procedure: RIGHT TOTAL KNEE ARTHROPLASTY;  Surgeon: Jodi Geralds, MD;  Location: [facility];  Service: Orthopedics;  Laterality: Right;   TOTAL KNEE ARTHROPLASTY Left 07/18/2020   Procedure: TOTAL KNEE ARTHROPLASTY;  Surgeon: Ollen Gross, MD;  Location: [facility];  Service: Orthopedics;  Laterality: Left;    TOTAL KNEE REVISION Right 07/22/2019   Procedure: Right knee polyethylene exchange;  Surgeon: Ollen Gross, MD;  Location: [facility];  Service: Orthopedics;  Laterality: Right;    Patient Active Problem List    Diagnosis Date Noted   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 09/13/2020   Acute diverticulitis 08/15/2020   Hypomagnesemia 08/15/2020   Hypocalcemia 08/15/2020   Type 2 diabetes mellitus with hyperlipidemia (HCC) 08/15/2020   Anemia 08/15/2020   Failed total right knee replacement (HCC) 07/22/2019   Postoperative anemia due to acute blood loss 06/24/2017   Primary osteoarthritis of right knee 06/21/2017   Primary osteoarthritis of left knee 06/21/2017   DM (diabetes mellitus), secondary, uncontrolled, with ketoacidosis (HCC) 05/23/2013   Hypokalemia 05/22/2013   Obesity (BMI 30-39.9) 05/22/2013   DKA (diabetic ketoacidoses) 05/21/2013   Muscle spasm 05/21/2013   Hyponatremia 05/21/2013   ARF (acute renal failure) secondary to dehydration from osmotic diuresis 05/21/2013   HTN (hypertension) 05/21/2013   Hyperlipidemia 05/21/2013   GERD (gastroesophageal reflux disease) 05/21/2013   Infection due to trichomonas 05/21/2013    REFERRING DIAG: E33.295 (ICD-10-CM) - Presence of left artificial knee joint   THERAPY DIAG:  No diagnosis found.  PERTINENT HISTORY: PERTINENT HISTORY: R TKA in 2019 or 2020 and revision done March 2021 in R, peripheral neuropathy, DDD, HTN   PRECAUTIONS: N/A  SUBJECTIVE: Pt states her knee is a little stiff from the exercise.   PAIN:  PAIN:  Are you having pain? Yes VAS scale: 4/10 Pain location: L and R knee PAIN TYPE: aching and sharp Pain description: intermittent        OBJECTIVE:  TODAY'S TREATMENT: Pt seen for aquatic therapy today.  Treatment took place in water 3.25-4 ft in depth at the [facility] pool. Temp of water was 92 deg.  Pt entered/exited the pool via stairs (step to pattern) with bilat rail and assistive device (walker/rolling cart) once getting out of pool.     Warm up: waist deep fwd and back, marching, sidestepping 4x (blue DB support bilat)     Exercises: standing march 20x STS 2x10 Fwd  step up 10x each side bilat UE support at steps  Stair step flexion stretch 10s 10x 2x each side Seated cycling 20x fwd and retro Seated HS stretch 30s 2x each- shown stair HS flexion stretch    Pt requires buoyancy for support and to offload joints with strengthening exercises. Viscosity of the water is needed for resistance of strengthening; water current perturbations provides challenge to standing balance unsupported, requiring increased core activation.  PATIENT EDUCATION:  Education details: anatomy, exercise progression, DOMS expectations, muscle firing, relaxation, envelope of function, HEP, POC   Person educated: Patient Education method: Explanation, Demonstration, Tactile cues, Verbal cues, and Handouts Education comprehension: verbalized understanding, returned demonstration, verbal cues required, tactile cues required, and needs further education     HOME EXERCISE PROGRAM: Access Code: [email or web address] URL: [email or web address] Date: 02/08/2021 Prepared by: Zebedee Iba       ASSESSMENT:   CLINICAL IMPRESSION: Pt with good tolerance to follow up session  but continues to have excessive tension and guarding in the aquatic setting. Pt required significant VC and reminders for relaxation of UE and bilat LE during walking in order to reduce report of stiffness and sharpness with exercise. Once pt able to relax, pt had report of decreased stiffness. Pt continues to request bilat UE support for walking due to confidence with walking in the pool. Pt able to progress knee flexion and improve L knee fleixon to be symmetrical to R at stairs in [facility].  Pt able to introduce more strengthening at today's session but will likely need review at next session. Plan to continue with aquatic knee ROM and strength. Pt would benefit from continued skilled therapy in order to reach goals and maximize functional bilat L strength and ROM for full return to PLOF.    REHAB POTENTIAL: Fair      CLINICAL DECISION MAKING: Stable/uncomplicated   EVALUATION COMPLEXITY: Low     GOALS:     SHORT TERM GOALS:   STG Name Target Date Goal status  1 Pt will become independent with HEP in order to demonstrate synthesis of PT education.   :  02/22/2021 INITIAL  2 Pt will have an at least 9 pt improvement in LEFS measure in order to demonstrate MCID improvement in daily function.   :  03/08/2021 INITIAL  3 Pt will report at least 2 pt reduction on VAS scale for pain in order to demonstrate functional improvement with household activity, self care, and ADL.   : 03/08/2021 INITIAL    LONG TERM GOALS:    LTG Name Target Date Goal status  1 Pt  will become independent with final HEP in order to demonstrate synthesis of PT education.     04/05/2021 INITIAL  2 Pt will be able to demonstrate/report ability to walk >15s mins without pain in order to demonstrate functional improvement and tolerance to exercise and community mobility.   04/05/2021 INITIAL  3 Pt will be able to demonstrate/report ability to sit/stand for >25 min periods of time without pain in order to  demonstrate functional improvement and tolerance to static positioning.    04/05/2021 INITIAL  4 Pt will be able to demonstrate ability to walk without AD in order to demonstrate functional improvement in LE function for self-care and house hold duties.     04/05/2021 INITIAL    PLAN: PT FREQUENCY: 2x/week   PT DURATION: 8 weeks   PLANNED INTERVENTIONS: Therapeutic exercises, Therapeutic activity, Neuro Muscular re-education, Balance training, Gait training, Patient/Family education, Joint mobilization, Stair training, Orthotic/Fit training, Aquatic Therapy, Dry Needling, Electrical stimulation, Spinal mobilization, Cryotherapy, Moist heat, scar mobilization, Taping, Vasopneumatic device, Traction, Ultrasound, Ionotophoresis 4mg /ml Dexamethasone, and Manual therapy   PLAN FOR NEXT SESSION: knee flexion stretching, quad  strength     PT, DPT 02/16/21 9:35 AM

## 2021-02-20 ENCOUNTER — Encounter (HOSPITAL_BASED_OUTPATIENT_CLINIC_OR_DEPARTMENT_OTHER): Payer: Self-pay | Admitting: Physical Therapy

## 2021-02-20 ENCOUNTER — Other Ambulatory Visit: Payer: Self-pay

## 2021-02-20 ENCOUNTER — Ambulatory Visit (HOSPITAL_BASED_OUTPATIENT_CLINIC_OR_DEPARTMENT_OTHER): Payer: Medicare HMO | Admitting: Physical Therapy

## 2021-02-20 DIAGNOSIS — R262 Difficulty in walking, not elsewhere classified: Secondary | ICD-10-CM | POA: Diagnosis not present

## 2021-02-20 DIAGNOSIS — G8929 Other chronic pain: Secondary | ICD-10-CM

## 2021-02-20 DIAGNOSIS — R293 Abnormal posture: Secondary | ICD-10-CM

## 2021-02-20 DIAGNOSIS — M6281 Muscle weakness (generalized): Secondary | ICD-10-CM

## 2021-02-20 DIAGNOSIS — R2681 Unsteadiness on feet: Secondary | ICD-10-CM

## 2021-02-20 DIAGNOSIS — R2689 Other abnormalities of gait and mobility: Secondary | ICD-10-CM

## 2021-02-20 NOTE — Therapy (Signed)
OUTPATIENT PHYSICAL THERAPY TREATMENT NOTE   Patient Name: Connie Branch MRN: [id number] DOB:[birth year], 70 y.o., female Today's Date: 02/20/2021  PCP: Sigmund Hazel, MD REFERRING PROVIDER: Sigmund Hazel, MD   PT End of Session - 02/20/21 1042     Visit Number 4    Number of Visits 16    Date for PT Re-Evaluation 05/09/21    Authorization Type [employer] Medicare    PT Start Time 0945    PT Stop Time 1032    PT Time Calculation (min) 47 min    Equipment Utilized During Treatment Other (comment)    Activity Tolerance Patient tolerated treatment well    Behavior During Therapy [facility] for tasks assessed/performed              Past Medical History:  Diagnosis Date   Anxiety    panic attacks   ARF (acute renal failure) secondary to dehydration from osmotic diuresis 05/21/2013   KIDNEY FUNCTION NORMAL NOW   Arthritis    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    Cataracts, bilateral    DDD (degenerative disc disease)    DKA (diabetic ketoacidoses) 05/21/2013   TYPE 2   GERD (gastroesophageal reflux disease)    Headache    SINUS   High cholesterol    History of claustrophobia    Hypertension    Neuropathy 2015   feet   Obesity    Osteopenia    PONV (postoperative nausea and vomiting)    Past Surgical History:  Procedure Laterality Date   ABDOMINAL HYSTERECTOMY     COMPLETE   BREAST EXCISIONAL BIOPSY Left 04/20/2020   ALH   BREAST LUMPECTOMY WITH RADIOACTIVE SEED LOCALIZATION Left 01/04/2021   Procedure: LEFT BREAST LUMPECTOMY WITH RADIOACTIVE SEED LOCALIZATION X 3;  Surgeon: Griselda Miner, MD;  Location: [facility];  Service: General;  Laterality: Left;   BREAST SURGERY Left 04/20/2020   BX   JOINT REPLACEMENT     KNEE JOINT MANIPULATION Right    OVARIAN CYST AND OVARY REMOVED  YRS AGO   TOTAL KNEE ARTHROPLASTY Right 06/21/2017   Procedure: RIGHT TOTAL KNEE ARTHROPLASTY;  Surgeon: Jodi Geralds, MD;  Location: [facility];  Service:  Orthopedics;  Laterality: Right;   TOTAL KNEE ARTHROPLASTY Left 07/18/2020   Procedure: TOTAL KNEE ARTHROPLASTY;  Surgeon: Ollen Gross, MD;  Location: [facility];  Service: Orthopedics;  Laterality: Left;    TOTAL KNEE REVISION Right 07/22/2019   Procedure: Right knee polyethylene exchange;  Surgeon: Ollen Gross, MD;  Location: [facility];  Service: Orthopedics;  Laterality: Right;    Patient Active Problem List   Diagnosis Date Noted   Atypical lobular hyperplasia [facility]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 09/13/2020   Acute diverticulitis 08/15/2020   Hypomagnesemia 08/15/2020   Hypocalcemia 08/15/2020   Type 2 diabetes mellitus with hyperlipidemia (HCC) 08/15/2020   Anemia 08/15/2020   Failed total right knee replacement (HCC) 07/22/2019   Postoperative anemia due to acute blood loss 06/24/2017   Primary osteoarthritis of right knee 06/21/2017   Primary osteoarthritis of left knee 06/21/2017   DM (diabetes mellitus), secondary, uncontrolled, with ketoacidosis (HCC) 05/23/2013   Hypokalemia 05/22/2013   Obesity (BMI 30-39.9) 05/22/2013   DKA (diabetic ketoacidoses) 05/21/2013   Muscle spasm 05/21/2013   Hyponatremia 05/21/2013   ARF (acute renal failure) secondary to dehydration from osmotic diuresis 05/21/2013   HTN (hypertension) 05/21/2013   Hyperlipidemia 05/21/2013   GERD (gastroesophageal reflux disease) 05/21/2013   Infection due to trichomonas 05/21/2013  REFERRING DIAG: J24.268 (ICD-10-CM) - Presence of left artificial knee joint   THERAPY DIAG:  No diagnosis found.  PERTINENT HISTORY: PERTINENT HISTORY: R TKA in 2019 or 2020 and revision done March 2021 in R, peripheral neuropathy, DDD, HTN   PRECAUTIONS: N/A  SUBJECTIVE: Pt states her knee is a little stiff from the exercise.   PAIN:  PAIN:  Are you having pain? Yes VAS scale: 4/10 Pain location: L and R knee PAIN TYPE: aching and sharp Pain description: intermittent        OBJECTIVE:   TODAY'S  TREATMENT: 02/20/21  Pt seen for aquatic therapy today.  Treatment took place in water 3.25-4 ft in depth at the [facility] pool. Temp of water was 92 deg.  Pt entered/exited the pool via stairs (step to pattern) with bilat rail and assistive device (walker/rolling cart) once getting out of pool.     Warm up: waist deep fwd and back, marching, sidestepping 4x (yellow DB support bilat)   Stretching Stair step knee flexion stretch 10s  4x each side -HS and gastroc  seated with manual assist x 5 10-15s   Exercises: Seated STS 2 x 10 -cycling 2 x 20 fwd and retro standing march 2x20 Fwd Step ups bottom step 10x R/L -side stepping left x 10 - Water step 50% submerged x10 R/L. Cues for proper tech/execution for function and strength.  Pt amb 2 widths of pool between most exercises for stretching and recovery decreasing ue support from 2 handbuoys to HHA, 1 hand assist to cga  Pt requires buoyancy for support and to offload joints with strengthening exercises. Viscosity of the water is needed for resistance of strengthening; water current perturbations provides challenge to standing balance unsupported, requiring increased core activation.   02/16/21 Pt seen for aquatic therapy today.  Treatment took place in water 3.25-4 ft in depth at the [facility] pool. Temp of water was 92 deg.  Pt entered/exited the pool via stairs (step to pattern) with bilat rail and assistive device (walker/rolling cart) once getting out of pool.     Warm up: waist deep fwd and back, marching, sidestepping 4x (blue DB support bilat)     Exercises: standing march 20x STS 2x10 Fwd step up 10x each side bilat UE support at steps  Stair step flexion stretch 10s 10x 2x each side Seated cycling 20x fwd and retro Seated HS stretch 30s 2x each- shown stair HS flexion stretch    Pt requires buoyancy for support and to offload joints with strengthening exercises. Viscosity of the water is needed  for resistance of strengthening; water current perturbations provides challenge to standing balance unsupported, requiring increased core activation.  PATIENT EDUCATION:  Education details: anatomy, exercise progression, DOMS expectations, muscle firing, relaxation, envelope of function, HEP, POC   Person educated: Patient Education method: Explanation, Demonstration, Tactile cues, Verbal cues, and Handouts Education comprehension: verbalized understanding, returned demonstration, verbal cues required, tactile cues required, and needs further education     HOME EXERCISE PROGRAM: Access Code: [email or web address] URL: [email or web address] Date: 02/08/2021 Prepared by: Zebedee Iba       ASSESSMENT:   CLINICAL IMPRESSION: Pt using improper technique with stair climbing. With cues and demonstration pt able to shift weight properly rising on step with improved technique. She does c/o some left knee discomfort but executes forward rising and descending using alternate pattern and decreased ue assist improving safety.  With encouragement and cues for proper gait pattern pt amb with decreasing UE support in 3  ft.    REHAB POTENTIAL: Fair     CLINICAL DECISION MAKING: Stable/uncomplicated   EVALUATION COMPLEXITY: Low     GOALS:     SHORT TERM GOALS:   STG Name Target Date Goal status  1 Pt will become independent with HEP in order to demonstrate synthesis of PT education.   :  02/22/2021 INITIAL  2 Pt will have an at least 9 pt improvement in LEFS measure in order to demonstrate MCID improvement in daily function.   :  03/08/2021 INITIAL  3 Pt will report at least 2 pt reduction on VAS scale for pain in order to demonstrate functional improvement with household activity, self care, and ADL.   : 03/08/2021 INITIAL    LONG TERM GOALS:    LTG Name Target Date Goal status  1 Pt  will become independent with final HEP in order to demonstrate synthesis of PT education.      04/05/2021 INITIAL  2 Pt will be able to demonstrate/report ability to walk >15s mins without pain in order to demonstrate functional improvement and tolerance to exercise and community mobility.   04/05/2021 INITIAL  3 Pt will be able to demonstrate/report ability to sit/stand for >25 min periods of time without pain in order to demonstrate functional improvement and tolerance to static positioning.    04/05/2021 INITIAL  4 Pt will be able to demonstrate ability to walk without AD in order to demonstrate functional improvement in LE function for self-care and house hold duties.     04/05/2021 INITIAL    PLAN: PT FREQUENCY: 2x/week   PT DURATION: 8 weeks   PLANNED INTERVENTIONS: Therapeutic exercises, Therapeutic activity, Neuro Muscular re-education, Balance training, Gait training, Patient/Family education, Joint mobilization, Stair training, Orthotic/Fit training, Aquatic Therapy, Dry Needling, Electrical stimulation, Spinal mobilization, Cryotherapy, Moist heat, scar mobilization, Taping, Vasopneumatic device, Traction, Ultrasound, Ionotophoresis 4mg /ml Dexamethasone, and Manual therapy   PLAN FOR NEXT SESSION: knee flexion stretching, quad strength     Erhard Senske (Frankie) Karely Hurtado MPT 02/20/21 10:43 AM

## 2021-02-22 ENCOUNTER — Other Ambulatory Visit: Payer: Self-pay

## 2021-02-22 ENCOUNTER — Encounter (HOSPITAL_BASED_OUTPATIENT_CLINIC_OR_DEPARTMENT_OTHER): Payer: Self-pay | Admitting: Physical Therapy

## 2021-02-22 ENCOUNTER — Ambulatory Visit (HOSPITAL_BASED_OUTPATIENT_CLINIC_OR_DEPARTMENT_OTHER): Payer: Medicare HMO | Admitting: Physical Therapy

## 2021-02-22 DIAGNOSIS — R262 Difficulty in walking, not elsewhere classified: Secondary | ICD-10-CM

## 2021-02-22 DIAGNOSIS — R2681 Unsteadiness on feet: Secondary | ICD-10-CM

## 2021-02-22 DIAGNOSIS — R2689 Other abnormalities of gait and mobility: Secondary | ICD-10-CM

## 2021-02-22 DIAGNOSIS — R293 Abnormal posture: Secondary | ICD-10-CM

## 2021-02-22 DIAGNOSIS — M6281 Muscle weakness (generalized): Secondary | ICD-10-CM

## 2021-02-22 DIAGNOSIS — G8929 Other chronic pain: Secondary | ICD-10-CM

## 2021-02-22 NOTE — Therapy (Signed)
[facility] [address] [place], [place], [address] Phone: [phone]   Fax:  [phone]  Physical Therapy Treatment  Patient Details  Name: Connie Branch MRN: [id number] Date of Birth: [birth year] No data recorded  Encounter Date: 02/22/2021   PT End of Session - 02/22/21 1317     Visit Number 5    Number of Visits 16    Date for PT Re-Evaluation 05/09/21    Authorization Type [employer] Medicare    PT Start Time 1120    PT Stop Time 1200    PT Time Calculation (min) 40 min    Equipment Utilized During Treatment Other (comment)    Activity Tolerance Patient tolerated treatment well    Behavior During Therapy [facility] for tasks assessed/performed             Past Medical History:  Diagnosis Date   Anxiety    panic attacks   ARF (acute renal failure) secondary to dehydration from osmotic diuresis 05/21/2013   KIDNEY FUNCTION NORMAL NOW   Arthritis    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    Cataracts, bilateral    DDD (degenerative disc disease)    DKA (diabetic ketoacidoses) 05/21/2013   TYPE 2   GERD (gastroesophageal reflux disease)    Headache    SINUS   High cholesterol    History of claustrophobia    Hypertension    Neuropathy 2015   feet   Obesity    Osteopenia    PONV (postoperative nausea and vomiting)     Past Surgical History:  Procedure Laterality Date   ABDOMINAL HYSTERECTOMY     COMPLETE   BREAST EXCISIONAL BIOPSY Left 04/20/2020   ALH   BREAST LUMPECTOMY WITH RADIOACTIVE SEED LOCALIZATION Left 01/04/2021   Procedure: LEFT BREAST LUMPECTOMY WITH RADIOACTIVE SEED LOCALIZATION X 3;  Surgeon: Griselda Miner, MD;  Location: [facility];  Service: General;  Laterality: Left;   BREAST SURGERY Left 04/20/2020   BX   JOINT REPLACEMENT     KNEE JOINT MANIPULATION Right    OVARIAN CYST AND OVARY REMOVED  YRS AGO   TOTAL KNEE ARTHROPLASTY Right 06/21/2017   Procedure:  RIGHT TOTAL KNEE ARTHROPLASTY;  Surgeon: Jodi Geralds, MD;  Location: [facility];  Service: Orthopedics;  Laterality: Right;   TOTAL KNEE ARTHROPLASTY Left 07/18/2020   Procedure: TOTAL KNEE ARTHROPLASTY;  Surgeon: Ollen Gross, MD;  Location: [facility];  Service: Orthopedics;  Laterality: Left;    TOTAL KNEE REVISION Right 07/22/2019   Procedure: Right knee polyethylene exchange;  Surgeon: Ollen Gross, MD;  Location: [facility];  Service: Orthopedics;  Laterality: Right;     There were no vitals filed for this visit.                                            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 09/13/2020   Acute diverticulitis 08/15/2020   Hypomagnesemia 08/15/2020  Hypocalcemia 08/15/2020   Type 2 diabetes mellitus with hyperlipidemia (HCC) 08/15/2020   Anemia 08/15/2020   Failed total right knee replacement (HCC) 07/22/2019   Postoperative anemia due to acute blood loss 06/24/2017   Primary osteoarthritis of right knee 06/21/2017   Primary osteoarthritis of left knee 06/21/2017   DM (diabetes mellitus), secondary, uncontrolled, with ketoacidosis (HCC) 05/23/2013   Hypokalemia 05/22/2013   Obesity (BMI 30-39.9) 05/22/2013   DKA (diabetic ketoacidoses) 05/21/2013   Muscle spasm 05/21/2013   Hyponatremia 05/21/2013   ARF (acute renal failure) secondary to dehydration from osmotic diuresis 05/21/2013   HTN (hypertension) 05/21/2013   Hyperlipidemia 05/21/2013   GERD (gastroesophageal reflux disease) 05/21/2013    Infection due to trichomonas 05/21/2013    Jeanmarie Hubert, PT 02/22/2021, 1:19 PM  [facility] [address] [place], [place], [address] Phone: [phone]   Fax:  [phone]  Name: Connie Branch MRN: [id number] Date of Birth: 10/30/1950

## 2021-02-22 NOTE — Therapy (Signed)
OUTPATIENT PHYSICAL THERAPY TREATMENT NOTE   Patient Name: Connie Branch MRN: [id number] DOB:02-Jun-1950, 71 y.o., female Today's Date: 02/22/2021  PCP: Sigmund Hazel, MD REFERRING PROVIDER: Sigmund Hazel, MD   PT End of Session - 02/22/21 1317     Visit Number 5    Number of Visits 16    Date for PT Re-Evaluation 05/09/21    Authorization Type [employer]    PT Start Time 1120    PT Stop Time 1200    PT Time Calculation (min) 40 min    Equipment Utilized During Treatment Other (comment)    Activity Tolerance Patient tolerated treatment well    Behavior During Therapy [facility] for tasks assessed/performed              Past Medical History:  Diagnosis Date   Anxiety    panic attacks   ARF (acute renal failure) secondary to dehydration from osmotic diuresis 05/21/2013   KIDNEY FUNCTION NORMAL NOW   Arthritis    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    Cataracts, bilateral    DDD (degenerative disc disease)    DKA (diabetic ketoacidoses) 05/21/2013   TYPE 2   GERD (gastroesophageal reflux disease)    Headache    SINUS   High cholesterol    History of claustrophobia    Hypertension    Neuropathy 2015   feet   Obesity    Osteopenia    PONV (postoperative nausea and vomiting)    Past Surgical History:  Procedure Laterality Date   ABDOMINAL HYSTERECTOMY     COMPLETE   BREAST EXCISIONAL BIOPSY Left 04/20/2020   ALH   BREAST LUMPECTOMY WITH RADIOACTIVE SEED LOCALIZATION Left 01/04/2021   Procedure: LEFT BREAST LUMPECTOMY WITH RADIOACTIVE SEED LOCALIZATION X 3;  Surgeon: Griselda Miner, MD;  Location: [facility];  Service: General;  Laterality: Left;   BREAST SURGERY Left 04/20/2020   BX   JOINT REPLACEMENT     KNEE JOINT MANIPULATION Right    OVARIAN CYST AND OVARY REMOVED  YRS AGO   TOTAL KNEE ARTHROPLASTY Right 06/21/2017   Procedure: RIGHT TOTAL KNEE ARTHROPLASTY;  Surgeon: Jodi Geralds, MD;  Location: [facility];  Service:  Orthopedics;  Laterality: Right;   TOTAL KNEE ARTHROPLASTY Left 07/18/2020   Procedure: TOTAL KNEE ARTHROPLASTY;  Surgeon: Ollen Gross, MD;  Location: [facility];  Service: Orthopedics;  Laterality: Left;    TOTAL KNEE REVISION Right 07/22/2019   Procedure: Right knee polyethylene exchange;  Surgeon: Ollen Gross, MD;  Location: [facility];  Service: Orthopedics;  Laterality: Right;    Patient Active Problem List   Diagnosis Date Noted   Atypical lobular hyperplasia [facility]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 09/13/2020   Acute diverticulitis 08/15/2020   Hypomagnesemia 08/15/2020   Hypocalcemia 08/15/2020   Type 2 diabetes mellitus with hyperlipidemia (HCC) 08/15/2020   Anemia 08/15/2020   Failed total right knee replacement (HCC) 07/22/2019   Postoperative anemia due to acute blood loss 06/24/2017   Primary osteoarthritis of right knee 06/21/2017   Primary osteoarthritis of left knee 06/21/2017   DM (diabetes mellitus), secondary, uncontrolled, with ketoacidosis (HCC) 05/23/2013   Hypokalemia 05/22/2013   Obesity (BMI 30-39.9) 05/22/2013   DKA (diabetic ketoacidoses) 05/21/2013   Muscle spasm 05/21/2013   Hyponatremia 05/21/2013   ARF (acute renal failure) secondary to dehydration from osmotic diuresis 05/21/2013   HTN (hypertension) 05/21/2013   Hyperlipidemia 05/21/2013   GERD (gastroesophageal reflux disease) 05/21/2013   Infection due to trichomonas 05/21/2013  REFERRING DIAG: O53.664 (ICD-10-CM) - Presence of left artificial knee joint   THERAPY DIAG:  Abnormal posture  Chronic pain of right knee  Difficulty in walking, not elsewhere classified  Muscle weakness (generalized)  Unsteadiness on feet  Other abnormalities of gait and mobility  PERTINENT HISTORY: PERTINENT HISTORY: R TKA in 2019 or 2020 and revision done March 2021 in R, peripheral neuropathy, DDD, HTN   PRECAUTIONS: N/A  SUBJECTIVE: "Knee is stiff"  PAIN:  PAIN:  Are you having pain? Yes VAS scale:  4/10 Pain location: L and R knee PAIN TYPE: stiff/tight Pain description: intermittent        OBJECTIVE:   TODAY'S TREATMENT: 02/22/21 Pt seen for aquatic therapy today.  Treatment took place in water 3.25-4 ft in depth at the [facility] pool. Temp of water was 92 deg.  Pt entered/exited the pool via stairs backward with bilat rail and assistive device (walker/rolling cart) once getting out of pool.     Warm up: waist deep fwd and back, marching, sidestepping 4x (yellow DB support bilat)   Stretching -HS, gastroc and adductor seated with manual assist x 5 10-15s   Exercises: Seated STS 2 x 10 -cycling 2 x 20 fwd and retro -high knee marching -knee flex/ext 2 x 10 reps. Cues for increased speed throughout entire range. standing march 2x10 supported by yellow hand buoys. -knee flex 2 x 10 Hip abd/add 2 x 10 Fwd Step ups bottom step 10x R/L - then completed on 2nd step increasing weight bearing -side step up left x 10   Pt amb 2 widths of pool between most exercises for stretching and recovery ue support 2 handbuoys. Gait training using hand buoys. Cues and demo for increased knee flex through swing and heel strike.  Pt requires buoyancy for support and to offload joints with strengthening exercises. Viscosity of the water is needed for resistance of strengthening; water current perturbations provides challenge to standing balance unsupported, requiring increased core activation.  02/20/21  Pt seen for aquatic therapy today.  Treatment took place in water 3.25-4 ft in depth at the [facility] pool. Temp of water was 92 deg.  Pt entered/exited the pool via stairs (step to pattern) with bilat rail and assistive device (walker/rolling cart) once getting out of pool.     Warm up: waist deep fwd and back, marching, sidestepping 4x (yellow DB support bilat)   Stretching Stair step knee flexion stretch 10s  4x each side -HS and gastroc  seated with manual  assist x 5 10-15s   Exercises: Seated STS 2 x 10 -cycling 2 x 20 fwd and retro standing march 2x20 Fwd Step ups bottom step 10x R/L -side stepping left x 10 - Water step 50% submerged x10 R/L. Cues for proper tech/execution for function and strength.  Pt amb 2 widths of pool between most exercises for stretching and recovery decreasing ue support from 2 handbuoys to HHA, 1 hand assist to cga  Pt requires buoyancy for support and to offload joints with strengthening exercises. Viscosity of the water is needed for resistance of strengthening; water current perturbations provides challenge to standing balance unsupported, requiring increased core activation.   02/16/21 Pt seen for aquatic therapy today.  Treatment took place in water 3.25-4 ft in depth at the [facility] pool. Temp of water was 92 deg.  Pt entered/exited the pool via stairs (step to pattern) with bilat rail and assistive device (walker/rolling cart) once getting out of pool.  Warm up: waist deep fwd and back, marching, sidestepping 4x (blue DB support bilat)     Exercises: standing march 20x STS 2x10 Fwd step up 10x each side bilat UE support at steps  Stair step flexion stretch 10s 10x 2x each side Seated cycling 20x fwd and retro Seated HS stretch 30s 2x each- shown stair HS flexion stretch    Pt requires buoyancy for support and to offload joints with strengthening exercises. Viscosity of the water is needed for resistance of strengthening; water current perturbations provides challenge to standing balance unsupported, requiring increased core activation.  PATIENT EDUCATION:  Education details: anatomy, exercise progression, DOMS expectations, muscle firing, relaxation, envelope of function, HEP, POC   Person educated: Patient Education method: Explanation, Demonstration, Tactile cues, Verbal cues, and Handouts Education comprehension: verbalized understanding, returned demonstration, verbal cues  required, tactile cues required, and needs further education     HOME EXERCISE PROGRAM: Access Code: [email or web address] URL: [email or web address] Date: 02/08/2021 Prepared by: Zebedee Iba       ASSESSMENT:   CLINICAL IMPRESSION: Pt with guarded position throughout session.  Despite VC and demonstration pt has difficulty with improving step length (knee flex and heel strike") with gait.  C/o "feeling" most exercises in her left knee, not pain, but limiting her engagement.  She demonstrates improvement with stair climbing positioning and weight shifting, but does continue to need VC.  She is slightly more confident in environment.    REHAB POTENTIAL: Fair     CLINICAL DECISION MAKING: Stable/uncomplicated   EVALUATION COMPLEXITY: Low     GOALS:     SHORT TERM GOALS:   STG Name Target Date Goal status  1 Pt will become independent with HEP in order to demonstrate synthesis of PT education.   :  02/22/2021 INITIAL  2 Pt will have an at least 9 pt improvement in LEFS measure in order to demonstrate MCID improvement in daily function.   :  03/08/2021 INITIAL  3 Pt will report at least 2 pt reduction on VAS scale for pain in order to demonstrate functional improvement with household activity, self care, and ADL.   : 03/08/2021 INITIAL    LONG TERM GOALS:    LTG Name Target Date Goal status  1 Pt  will become independent with final HEP in order to demonstrate synthesis of PT education.     04/05/2021 INITIAL  2 Pt will be able to demonstrate/report ability to walk >15s mins without pain in order to demonstrate functional improvement and tolerance to exercise and community mobility.   04/05/2021 INITIAL  3 Pt will be able to demonstrate/report ability to sit/stand for >25 min periods of time without pain in order to demonstrate functional improvement and tolerance to static positioning.    04/05/2021 INITIAL  4 Pt will be able to demonstrate ability to walk without AD in  order to demonstrate functional improvement in LE function for self-care and house hold duties.     04/05/2021 INITIAL    PLAN: PT FREQUENCY: 2x/week   PT DURATION: 8 weeks   PLANNED INTERVENTIONS: Therapeutic exercises, Therapeutic activity, Neuro Muscular re-education, Balance training, Gait training, Patient/Family education, Joint mobilization, Stair training, Orthotic/Fit training, Aquatic Therapy, Dry Needling, Electrical stimulation, Spinal mobilization, Cryotherapy, Moist heat, scar mobilization, Taping, Vasopneumatic device, Traction, Ultrasound, Ionotophoresis 4mg /ml Dexamethasone, and Manual therapy   PLAN FOR NEXT SESSION: knee flexion stretching, quad strength     Sebastien Jackson (Frankie) Marsden Zaino MPT 02/22/21 1:37 PM

## 2021-02-27 ENCOUNTER — Other Ambulatory Visit: Payer: Self-pay

## 2021-02-27 ENCOUNTER — Encounter (HOSPITAL_BASED_OUTPATIENT_CLINIC_OR_DEPARTMENT_OTHER): Payer: Medicare HMO | Attending: Family Medicine | Admitting: Physical Therapy

## 2021-02-27 ENCOUNTER — Encounter (HOSPITAL_BASED_OUTPATIENT_CLINIC_OR_DEPARTMENT_OTHER): Payer: Self-pay | Admitting: Physical Therapy

## 2021-02-27 DIAGNOSIS — R262 Difficulty in walking, not elsewhere classified: Secondary | ICD-10-CM | POA: Diagnosis present

## 2021-02-27 DIAGNOSIS — M25561 Pain in right knee: Secondary | ICD-10-CM | POA: Diagnosis present

## 2021-02-27 DIAGNOSIS — R2689 Other abnormalities of gait and mobility: Secondary | ICD-10-CM | POA: Diagnosis present

## 2021-02-27 DIAGNOSIS — R2681 Unsteadiness on feet: Secondary | ICD-10-CM | POA: Insufficient documentation

## 2021-02-27 DIAGNOSIS — G8929 Other chronic pain: Secondary | ICD-10-CM | POA: Insufficient documentation

## 2021-02-27 DIAGNOSIS — R293 Abnormal posture: Secondary | ICD-10-CM | POA: Insufficient documentation

## 2021-02-27 DIAGNOSIS — M6281 Muscle weakness (generalized): Secondary | ICD-10-CM | POA: Diagnosis present

## 2021-02-27 NOTE — Therapy (Signed)
OUTPATIENT PHYSICAL THERAPY TREATMENT NOTE   Patient Name: Connie Branch MRN: [id number] DOB:[birth year], 70 y.o., female Today's Date: 02/27/2021  PCP: Sigmund Hazel, MD REFERRING PROVIDER: Sigmund Hazel, MD   PT End of Session - 02/27/21 1031     Visit Number 6    Number of Visits 16    Date for PT Re-Evaluation 05/09/21    Authorization Type [employer] Medicare    PT Start Time 1030    PT Stop Time 1115    PT Time Calculation (min) 45 min    Equipment Utilized During Treatment Other (comment)    Activity Tolerance Patient tolerated treatment well    Behavior During Therapy [facility] for tasks assessed/performed              Past Medical History:  Diagnosis Date   Anxiety    panic attacks   ARF (acute renal failure) secondary to dehydration from osmotic diuresis 05/21/2013   KIDNEY FUNCTION NORMAL NOW   Arthritis    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    Cataracts, bilateral    DDD (degenerative disc disease)    DKA (diabetic ketoacidoses) 05/21/2013   TYPE 2   GERD (gastroesophageal reflux disease)    Headache    SINUS   High cholesterol    History of claustrophobia    Hypertension    Neuropathy 2015   feet   Obesity    Osteopenia    PONV (postoperative nausea and vomiting)    Past Surgical History:  Procedure Laterality Date   ABDOMINAL HYSTERECTOMY     COMPLETE   BREAST EXCISIONAL BIOPSY Left 04/20/2020   ALH   BREAST LUMPECTOMY WITH RADIOACTIVE SEED LOCALIZATION Left 01/04/2021   Procedure: LEFT BREAST LUMPECTOMY WITH RADIOACTIVE SEED LOCALIZATION X 3;  Surgeon: Griselda Miner, MD;  Location: [facility];  Service: General;  Laterality: Left;   BREAST SURGERY Left 04/20/2020   BX   JOINT REPLACEMENT     KNEE JOINT MANIPULATION Right    OVARIAN CYST AND OVARY REMOVED  YRS AGO   TOTAL KNEE ARTHROPLASTY Right 06/21/2017   Procedure: RIGHT TOTAL KNEE ARTHROPLASTY;  Surgeon: Jodi Geralds, MD;  Location: [facility];  Service:  Orthopedics;  Laterality: Right;   TOTAL KNEE ARTHROPLASTY Left 07/18/2020   Procedure: TOTAL KNEE ARTHROPLASTY;  Surgeon: Ollen Gross, MD;  Location: [facility];  Service: Orthopedics;  Laterality: Left;    TOTAL KNEE REVISION Right 07/22/2019   Procedure: Right knee polyethylene exchange;  Surgeon: Ollen Gross, MD;  Location: [facility];  Service: Orthopedics;  Laterality: Right;    Patient Active Problem List   Diagnosis Date Noted   Atypical lobular hyperplasia [facility]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 09/13/2020   Acute diverticulitis 08/15/2020   Hypomagnesemia 08/15/2020   Hypocalcemia 08/15/2020   Type 2 diabetes mellitus with hyperlipidemia (HCC) 08/15/2020   Anemia 08/15/2020   Failed total right knee replacement (HCC) 07/22/2019   Postoperative anemia due to acute blood loss 06/24/2017   Primary osteoarthritis of right knee 06/21/2017   Primary osteoarthritis of left knee 06/21/2017   DM (diabetes mellitus), secondary, uncontrolled, with ketoacidosis (HCC) 05/23/2013   Hypokalemia 05/22/2013   Obesity (BMI 30-39.9) 05/22/2013   DKA (diabetic ketoacidoses) 05/21/2013   Muscle spasm 05/21/2013   Hyponatremia 05/21/2013   ARF (acute renal failure) secondary to dehydration from osmotic diuresis 05/21/2013   HTN (hypertension) 05/21/2013   Hyperlipidemia 05/21/2013   GERD (gastroesophageal reflux disease) 05/21/2013   Infection due to trichomonas 05/21/2013  REFERRING DIAG: T01.601 (ICD-10-CM) - Presence of left artificial knee joint   THERAPY DIAG:  Abnormal posture  Other abnormalities of gait and mobility  Unsteadiness on feet  Chronic pain of right knee  Difficulty in walking, not elsewhere classified  Muscle weakness (generalized)  PERTINENT HISTORY: PERTINENT HISTORY: R TKA in 2019 or 2020 and revision done March 2021 in R, peripheral neuropathy, DDD, HTN   PRECAUTIONS: N/A  SUBJECTIVE: "Knee is stiff" Repeated again today.  PAIN:  PAIN:  Are you having  pain? Yes VAS scale: 4/10 Pain location: L and R knee PAIN TYPE: stiff/tight Pain description: intermittent        OBJECTIVE:   TODAY'S TREATMENT: 02/27/21 Pt seen for aquatic therapy today.  Treatment took place in water 3.25-4 ft in depth at the [facility] pool. Temp of water was 92 deg.  Pt entered/exited the pool via stairs backward with bilat rail and assistive device (walker/rolling cart) once getting out of pool.     Warm up: waist deep fwd and back, marching, sidestepping 4x (yellow DB support bilat)   Stretching -HS, gastroc and adductor seated with manual assist x 5 10-15s   Exercises: Seated STS 2 x 10 -cycling 2 x 20 fwd and retro -high knee marching -knee flex/ext 2 x 10 reps. Cues for increased speed throughout entire range. -flutter kicking at hip x 20   Standing - march 2x20 Hip abd/add 2 x 10 Hip hinges standing x15 Lateral rotation x10 Fwd Step ups bottom step 10x R/L -side stepping left x10, then x5; R x 10 decreased ue support to hand held bilaterally SL squat R/L x 10 supported by wall then bilat ue hand held to improve execution.  Gait training using hand buoys. Cues and demo for increased knee flex through swing and heel strike. Backward amb x 6 widths   Pt requires buoyancy for support and to offload joints with strengthening exercises. Viscosity of the water is needed for resistance of strengthening; water current perturbations provides challenge to standing balance unsupported, requiring increased core activation.    02/22/21 Pt seen for aquatic therapy today.  Treatment took place in water 3.25-4 ft in depth at the [facility] pool. Temp of water was 92 deg.  Pt entered/exited the pool via stairs backward with bilat rail and assistive device (walker/rolling cart) once getting out of pool.     Warm up: waist deep fwd and back, marching, sidestepping 4x (yellow DB support bilat)   Stretching -HS, gastroc and adductor  seated with manual assist x 5 10-15s   Exercises: Seated STS 2 x 10 -cycling 2 x 20 fwd and retro -high knee marching -knee flex/ext 2 x 10 reps. Cues for increased speed throughout entire range. standing march 2x10 supported by yellow hand buoys. -knee flex 2 x 10 Hip abd/add 2 x 10 Fwd Step ups bottom step 10x R/L - then completed on 2nd step increasing weight bearing -side step up left x 10   Pt amb 2 widths of pool between most exercises for stretching and recovery ue support 2 handbuoys. Gait training using hand buoys. Cues and demo for increased knee flex through swing and heel strike.  Pt requires buoyancy for support and to offload joints with strengthening exercises. Viscosity of the water is needed for resistance of strengthening; water current perturbations provides challenge to standing balance unsupported, requiring increased core activation.  02/20/21  Pt seen for aquatic therapy today.  Treatment took place in water 3.25-4 ft in depth  at the [facility] pool. Temp of water was 92 deg.  Pt entered/exited the pool via stairs (step to pattern) with bilat rail and assistive device (walker/rolling cart) once getting out of pool.     Warm up: waist deep fwd and back, marching, sidestepping 4x (yellow DB support bilat)   Stretching Stair step knee flexion stretch 10s  4x each side -HS and gastroc  seated with manual assist x 5 10-15s   Exercises: Seated  -STS x 10 -Knee flex/ext 2 x10 -Flutter kicking at hip x20 -cycling 2 x 20 fwd and retro  Standing - march 2x20 Hip hinges standing x15 Lateral rotation x10 Fwd Step ups bottom step 10x R/L -side stepping left x10, then x5; R x 10 decreased ue support to hand held bilaterally SL squat R/L x 10    Pt amb 2 widths of pool between most exercises for stretching and recovery decreasing ue support from 2 handbuoys to HHA, 1 hand assist to cga  Pt requires buoyancy for support and to offload joints with  strengthening exercises. Viscosity of the water is needed for resistance of strengthening; water current perturbations provides challenge to standing balance unsupported, requiring increased core activation.   02/16/21 Pt seen for aquatic therapy today.  Treatment took place in water 3.25-4 ft in depth at the [facility] pool. Temp of water was 92 deg.  Pt entered/exited the pool via stairs (step to pattern) with bilat rail and assistive device (walker/rolling cart) once getting out of pool.     Warm up: waist deep fwd and back, marching, sidestepping 4x (blue DB support bilat)     Exercises: standing march 20x STS 2x10 Fwd step up 10x each side bilat UE support at steps  Stair step flexion stretch 10s 10x 2x each side Seated cycling 20x fwd and retro Seated HS stretch 30s 2x each- shown stair HS flexion stretch    Pt requires buoyancy for support and to offload joints with strengthening exercises. Viscosity of the water is needed for resistance of strengthening; water current perturbations provides challenge to standing balance unsupported, requiring increased core activation.  PATIENT EDUCATION:  Education details: anatomy, exercise progression, DOMS expectations, muscle firing, relaxation, envelope of function, HEP, POC   Person educated: Patient Education method: Explanation, Demonstration, Tactile cues, Verbal cues, and Handouts Education comprehension: verbalized understanding, returned demonstration, verbal cues required, tactile cues required, and needs further education     HOME EXERCISE PROGRAM: Access Code: [email or web address] URL: [email or web address] Date: 02/08/2021 Prepared by: Zebedee Iba       ASSESSMENT:   CLINICAL IMPRESSION:  Pt continues to be in guarded position throughout treatment. Completed hip hiking and core rotation to decrease fear and encourage improved fluidity of motion. Gait training: Of note: pt in full left knee extension  throughout  heel strike until toe off.   Worked with pt on initiating knee flex prior to toe off without success.  She does c/o left knee "not wanting to bend".  With backward amb pt forced to flex knees. I do believe it is volitional although subconscious as she is so fearful. VMO weakness evident.     REHAB POTENTIAL: Fair     CLINICAL DECISION MAKING: Stable/uncomplicated   EVALUATION COMPLEXITY: Low     GOALS:     SHORT TERM GOALS:   STG Name Target Date Goal status  1 Pt will become independent with HEP in order to demonstrate synthesis of PT education.   :  02/22/2021 INITIAL  2 Pt  will have an at least 9 pt improvement in LEFS measure in order to demonstrate MCID improvement in daily function.   :  03/08/2021 INITIAL  3 Pt will report at least 2 pt reduction on VAS scale for pain in order to demonstrate functional improvement with household activity, self care, and ADL.   : 03/08/2021 INITIAL    LONG TERM GOALS:    LTG Name Target Date Goal status  1 Pt  will become independent with final HEP in order to demonstrate synthesis of PT education.     04/05/2021 INITIAL  2 Pt will be able to demonstrate/report ability to walk >15s mins without pain in order to demonstrate functional improvement and tolerance to exercise and community mobility.   04/05/2021 INITIAL  3 Pt will be able to demonstrate/report ability to sit/stand for >25 min periods of time without pain in order to demonstrate functional improvement and tolerance to static positioning.    04/05/2021 INITIAL  4 Pt will be able to demonstrate ability to walk without AD in order to demonstrate functional improvement in LE function for self-care and house hold duties.     04/05/2021 INITIAL    PLAN: PT FREQUENCY: 2x/week   PT DURATION: 8 weeks   PLANNED INTERVENTIONS: Therapeutic exercises, Therapeutic activity, Neuro Muscular re-education, Balance training, Gait training, Patient/Family education, Joint  mobilization, Stair training, Orthotic/Fit training, Aquatic Therapy, Dry Needling, Electrical stimulation, Spinal mobilization, Cryotherapy, Moist heat, scar mobilization, Taping, Vasopneumatic device, Traction, Ultrasound, Ionotophoresis 4mg /ml Dexamethasone, and Manual therapy   PLAN FOR NEXT SESSION: knee flexion stretching, quad strength     Bergen Melle (Frankie) Jden Want MPT 02/27/21 11:34 AM

## 2021-03-01 ENCOUNTER — Encounter (HOSPITAL_BASED_OUTPATIENT_CLINIC_OR_DEPARTMENT_OTHER): Payer: Self-pay | Admitting: Physical Therapy

## 2021-03-01 ENCOUNTER — Other Ambulatory Visit: Payer: Self-pay

## 2021-03-01 ENCOUNTER — Ambulatory Visit (HOSPITAL_BASED_OUTPATIENT_CLINIC_OR_DEPARTMENT_OTHER): Payer: Medicare HMO | Admitting: Physical Therapy

## 2021-03-01 DIAGNOSIS — M6281 Muscle weakness (generalized): Secondary | ICD-10-CM

## 2021-03-01 DIAGNOSIS — R2681 Unsteadiness on feet: Secondary | ICD-10-CM

## 2021-03-01 DIAGNOSIS — R293 Abnormal posture: Secondary | ICD-10-CM

## 2021-03-01 DIAGNOSIS — R2689 Other abnormalities of gait and mobility: Secondary | ICD-10-CM

## 2021-03-01 DIAGNOSIS — R262 Difficulty in walking, not elsewhere classified: Secondary | ICD-10-CM

## 2021-03-01 DIAGNOSIS — G8929 Other chronic pain: Secondary | ICD-10-CM

## 2021-03-01 NOTE — Therapy (Signed)
OUTPATIENT PHYSICAL THERAPY TREATMENT NOTE   Patient Name: Connie Branch MRN: [id number] DOB:[birth year], 70 y.o., female Today's Date: 03/01/2021  PCP: Sigmund Hazel, MD REFERRING PROVIDER: Sigmund Hazel, MD   PT End of Session - 03/01/21 0958     Visit Number 7    Number of Visits 16    Date for PT Re-Evaluation 05/09/21    Authorization Type [employer] Medicare    PT Start Time [id number]    PT Stop Time 1033    PT Time Calculation (min) 45 min    Equipment Utilized During Treatment Other (comment)    Activity Tolerance Patient tolerated treatment well    Behavior During Therapy [facility] for tasks assessed/performed              Past Medical History:  Diagnosis Date   Anxiety    panic attacks   ARF (acute renal failure) secondary to dehydration from osmotic diuresis 05/21/2013   KIDNEY FUNCTION NORMAL NOW   Arthritis    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    Cataracts, bilateral    DDD (degenerative disc disease)    DKA (diabetic ketoacidoses) 05/21/2013   TYPE 2   GERD (gastroesophageal reflux disease)    Headache    SINUS   High cholesterol    History of claustrophobia    Hypertension    Neuropathy 2015   feet   Obesity    Osteopenia    PONV (postoperative nausea and vomiting)    Past Surgical History:  Procedure Laterality Date   ABDOMINAL HYSTERECTOMY     COMPLETE   BREAST EXCISIONAL BIOPSY Left 04/20/2020   ALH   BREAST LUMPECTOMY WITH RADIOACTIVE SEED LOCALIZATION Left 01/04/2021   Procedure: LEFT BREAST LUMPECTOMY WITH RADIOACTIVE SEED LOCALIZATION X 3;  Surgeon: Griselda Miner, MD;  Location: [facility];  Service: General;  Laterality: Left;   BREAST SURGERY Left 04/20/2020   BX   JOINT REPLACEMENT     KNEE JOINT MANIPULATION Right    OVARIAN CYST AND OVARY REMOVED  YRS AGO   TOTAL KNEE ARTHROPLASTY Right 06/21/2017   Procedure: RIGHT TOTAL KNEE ARTHROPLASTY;  Surgeon: Jodi Geralds, MD;  Location: [facility];  Service:  Orthopedics;  Laterality: Right;   TOTAL KNEE ARTHROPLASTY Left 07/18/2020   Procedure: TOTAL KNEE ARTHROPLASTY;  Surgeon: Ollen Gross, MD;  Location: [facility];  Service: Orthopedics;  Laterality: Left;    TOTAL KNEE REVISION Right 07/22/2019   Procedure: Right knee polyethylene exchange;  Surgeon: Ollen Gross, MD;  Location: [facility];  Service: Orthopedics;  Laterality: Right;    Patient Active Problem List   Diagnosis Date Noted   Atypical lobular hyperplasia [facility]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 09/13/2020   Acute diverticulitis 08/15/2020   Hypomagnesemia 08/15/2020   Hypocalcemia 08/15/2020   Type 2 diabetes mellitus with hyperlipidemia (HCC) 08/15/2020   Anemia 08/15/2020   Failed total right knee replacement (HCC) 07/22/2019   Postoperative anemia due to acute blood loss 06/24/2017   Primary osteoarthritis of right knee 06/21/2017   Primary osteoarthritis of left knee 06/21/2017   DM (diabetes mellitus), secondary, uncontrolled, with ketoacidosis (HCC) 05/23/2013   Hypokalemia 05/22/2013   Obesity (BMI 30-39.9) 05/22/2013   DKA (diabetic ketoacidoses) 05/21/2013   Muscle spasm 05/21/2013   Hyponatremia 05/21/2013   ARF (acute renal failure) secondary to dehydration from osmotic diuresis 05/21/2013   HTN (hypertension) 05/21/2013   Hyperlipidemia 05/21/2013   GERD (gastroesophageal reflux disease) 05/21/2013   Infection due to trichomonas 05/21/2013  REFERRING DIAG: I14.431 (ICD-10-CM) - Presence of left artificial knee joint   THERAPY DIAG:  Abnormal posture  Chronic pain of right knee  Difficulty in walking, not elsewhere classified  Muscle weakness (generalized)  Unsteadiness on feet  Other abnormalities of gait and mobility  PERTINENT HISTORY: PERTINENT HISTORY: R TKA in 2019 or 2020 and revision done March 2021 in R, peripheral neuropathy, DDD, HTN   PRECAUTIONS: N/A  SUBJECTIVE: "I fell like I just can't get my left knee to bend".  PAIN:  PAIN:  Are  you having pain? Yes VAS scale: 4/10 Pain location: L knee; R knee stiff PAIN TYPE: stiff/tight Pain description: intermittent        OBJECTIVE:   TODAY'S TREATMENT:  03/01/21 Pt seen for aquatic therapy today.  Treatment took place in water 3.25-4 ft in depth at the [facility] pool. Temp of water was 92 deg.  Pt entered/exited the pool via stairs backward with bilat rail and assistive device (walker/rolling cart) once getting out of pool.     Warm up: waist deep fwd and back, marching, sidestepping 6x ea (yellow DB support bilat)     Exercises: Standing Fwd Step ups bottom step 10x R/L -side stepping R/L x10 -backward R/L x10  Vc, demonstration and TC for proper execution Squats bottom water step and 55ft x10 ea SL squat R/L x 10 24ft holding to wall SL squat R/L x 10 supported by wall then bilat ue hand held to improve execution. VC for full knee extension and focus of pushing up through heel  Gait training using hand buoys. Cues and demo for increased knee flex through swing.  Pt requires buoyancy for support and to offload joints with strengthening exercises. Viscosity of the water is needed for resistance of strengthening; water current perturbations provides challenge to standing balance unsupported, requiring increased core activation.      02/27/21 Pt seen for aquatic therapy today.  Treatment took place in water 3.25-4 ft in depth at the [facility] pool. Temp of water was 92 deg.  Pt entered/exited the pool via stairs backward with bilat rail and assistive device (walker/rolling cart) once getting out of pool.     Warm up: waist deep fwd and back, marching, sidestepping 4x (yellow DB support bilat)   Stretching -HS, gastroc and adductor seated with manual assist x 5 10-15s   Exercises: Seated STS 2 x 10 -cycling 2 x 20 fwd and retro -high knee marching -knee flex/ext 2 x 10 reps. Cues for increased speed throughout entire range. -flutter  kicking at hip x 20   Standing - march 2x20 Hip abd/add 2 x 10 Hip hinges standing x15 Lateral rotation x10 Fwd Step ups bottom step 10x R/L -side stepping left x10, then x5; R x 10 decreased ue support to hand held bilaterally SL squat R/L x 10 supported by wall then bilat ue hand held to improve execution.  Gait training using hand buoys. Cues and demo for increased knee flex through swing and heel strike. Backward amb x 6 widths   Pt requires buoyancy for support and to offload joints with strengthening exercises. Viscosity of the water is needed for resistance of strengthening; water current perturbations provides challenge to standing balance unsupported, requiring increased core activation.    02/22/21 Pt seen for aquatic therapy today.  Treatment took place in water 3.25-4 ft in depth at the [facility] pool. Temp of water was 92 deg.  Pt entered/exited the pool via stairs backward with bilat  rail and assistive device (walker/rolling cart) once getting out of pool.     Warm up: waist deep fwd and back, marching, sidestepping 4x (yellow DB support bilat)   Stretching -HS, gastroc and adductor seated with manual assist x 5 10-15s   Exercises: Seated STS 2 x 10 -cycling 2 x 20 fwd and retro -high knee marching -knee flex/ext 2 x 10 reps. Cues for increased speed throughout entire range. standing march 2x10 supported by yellow hand buoys. -knee flex 2 x 10 Hip abd/add 2 x 10 Fwd Step ups bottom step 10x R/L - then completed on 2nd step increasing weight bearing -side step up left x 10   Pt amb 2 widths of pool between most exercises for stretching and recovery ue support 2 handbuoys. Gait training using hand buoys. Cues and demo for increased knee flex through swing and heel strike.  Pt requires buoyancy for support and to offload joints with strengthening exercises. Viscosity of the water is needed for resistance of strengthening; water current perturbations  provides challenge to standing balance unsupported, requiring increased core activation.  02/20/21  Pt seen for aquatic therapy today.  Treatment took place in water 3.25-4 ft in depth at the [facility] pool. Temp of water was 92 deg.  Pt entered/exited the pool via stairs (step to pattern) with bilat rail and assistive device (walker/rolling cart) once getting out of pool.     Warm up: waist deep fwd and back, marching, sidestepping 4x (yellow DB support bilat)   Stretching Stair step knee flexion stretch 10s  4x each side -HS and gastroc  seated with manual assist x 5 10-15s   Exercises: Seated  -STS x 10 -Knee flex/ext 2 x10 -Flutter kicking at hip x20 -cycling 2 x 20 fwd and retro  Standing - march 2x20 Hip hinges standing x15 Lateral rotation x10 Fwd Step ups bottom step 10x R/L -side stepping left x10, then x5; R x 10 decreased ue support to hand held bilaterally SL squat R/L x 10    Pt amb 2 widths of pool between most exercises for stretching and recovery decreasing ue support from 2 handbuoys to HHA, 1 hand assist to cga  Pt requires buoyancy for support and to offload joints with strengthening exercises. Viscosity of the water is needed for resistance of strengthening; water current perturbations provides challenge to standing balance unsupported, requiring increased core activation.   02/16/21 Pt seen for aquatic therapy today.  Treatment took place in water 3.25-4 ft in depth at the [facility] pool. Temp of water was 92 deg.  Pt entered/exited the pool via stairs (step to pattern) with bilat rail and assistive device (walker/rolling cart) once getting out of pool.     Warm up: waist deep fwd and back, marching, sidestepping 4x (blue DB support bilat)     Exercises: standing march 20x STS 2x10 Fwd step up 10x each side bilat UE support at steps  Stair step flexion stretch 10s 10x 2x each side Seated cycling 20x fwd and retro Seated HS  stretch 30s 2x each- shown stair HS flexion stretch    Pt requires buoyancy for support and to offload joints with strengthening exercises. Viscosity of the water is needed for resistance of strengthening; water current perturbations provides challenge to standing balance unsupported, requiring increased core activation.  PATIENT EDUCATION:  Education details: anatomy, exercise progression, DOMS expectations, muscle firing, relaxation, envelope of function, HEP, POC   Person educated: Patient Education method: Explanation, Demonstration, Tactile cues, Verbal  cues, and Handouts Education comprehension: verbalized understanding, returned demonstration, verbal cues required, tactile cues required, and needs further education     HOME EXERCISE PROGRAM: Access Code: [email or web address] URL: [email or web address] Date: 02/08/2021 Prepared by: Zebedee Iba       ASSESSMENT:   CLINICAL IMPRESSION:  Pt continues to guard left knee with movement.  Difficult for her to disengage left quad to initiate knee flex. Worked on proximal strength of bilat le to improve knee stabilization. Pt with slightly improved gait with practice of left knee flex initiating knee flex just prior to toe off. VMO strengthening bilaterally         REHAB POTENTIAL: Fair     CLINICAL DECISION MAKING: Stable/uncomplicated   EVALUATION COMPLEXITY: Low     GOALS:     SHORT TERM GOALS:   STG Name Target Date Goal status  1 Pt will become independent with HEP in order to demonstrate synthesis of PT education.   :  02/22/2021 INITIAL  2 Pt will have an at least 9 pt improvement in LEFS measure in order to demonstrate MCID improvement in daily function.   :  03/08/2021 INITIAL  3 Pt will report at least 2 pt reduction on VAS scale for pain in order to demonstrate functional improvement with household activity, self care, and ADL.   : 03/08/2021 INITIAL    LONG TERM GOALS:    LTG Name Target Date Goal  status  1 Pt  will become independent with final HEP in order to demonstrate synthesis of PT education.     04/05/2021 INITIAL  2 Pt will be able to demonstrate/report ability to walk >15s mins without pain in order to demonstrate functional improvement and tolerance to exercise and community mobility.   04/05/2021 INITIAL  3 Pt will be able to demonstrate/report ability to sit/stand for >25 min periods of time without pain in order to demonstrate functional improvement and tolerance to static positioning.    04/05/2021 INITIAL  4 Pt will be able to demonstrate ability to walk without AD in order to demonstrate functional improvement in LE function for self-care and house hold duties.     04/05/2021 INITIAL    PLAN: PT FREQUENCY: 2x/week   PT DURATION: 8 weeks   PLANNED INTERVENTIONS: Therapeutic exercises, Therapeutic activity, Neuro Muscular re-education, Balance training, Gait training, Patient/Family education, Joint mobilization, Stair training, Orthotic/Fit training, Aquatic Therapy, Dry Needling, Electrical stimulation, Spinal mobilization, Cryotherapy, Moist heat, scar mobilization, Taping, Vasopneumatic device, Traction, Ultrasound, Ionotophoresis 4mg /ml Dexamethasone, and Manual therapy   PLAN FOR NEXT SESSION: knee flexion stretching, VMO strength     Princes [place]inger (Frankie) Mairin Lindsley MPT 03/01/21 1:31 PM

## 2021-03-06 ENCOUNTER — Ambulatory Visit (HOSPITAL_BASED_OUTPATIENT_CLINIC_OR_DEPARTMENT_OTHER): Payer: Medicare HMO | Admitting: Physical Therapy

## 2021-03-06 ENCOUNTER — Encounter (HOSPITAL_BASED_OUTPATIENT_CLINIC_OR_DEPARTMENT_OTHER): Payer: Self-pay | Admitting: Physical Therapy

## 2021-03-06 ENCOUNTER — Other Ambulatory Visit: Payer: Self-pay

## 2021-03-06 DIAGNOSIS — R262 Difficulty in walking, not elsewhere classified: Secondary | ICD-10-CM

## 2021-03-06 DIAGNOSIS — M25561 Pain in right knee: Secondary | ICD-10-CM

## 2021-03-06 DIAGNOSIS — R2681 Unsteadiness on feet: Secondary | ICD-10-CM

## 2021-03-06 DIAGNOSIS — R293 Abnormal posture: Secondary | ICD-10-CM

## 2021-03-06 DIAGNOSIS — M6281 Muscle weakness (generalized): Secondary | ICD-10-CM

## 2021-03-06 DIAGNOSIS — R2689 Other abnormalities of gait and mobility: Secondary | ICD-10-CM

## 2021-03-06 DIAGNOSIS — G8929 Other chronic pain: Secondary | ICD-10-CM

## 2021-03-06 NOTE — Therapy (Signed)
OUTPATIENT PHYSICAL THERAPY TREATMENT NOTE   Patient Name: Mckinzie Saksa MRN: [id number] DOB:1951-02-07, 70 y.o., female Today's Date: 03/07/2021  PCP: Sigmund Hazel, MD REFERRING PROVIDER: Sigmund Hazel, MD   PT End of Session - 03/06/21 1014     Visit Number 8    Number of Visits 16    Date for PT Re-Evaluation 05/09/21    Authorization Type [employer] Medicare    PT Start Time [phone]    PT Stop Time 1030    PT Time Calculation (min) 44 min    Equipment Utilized During Treatment Other (comment)    Activity Tolerance Patient tolerated treatment well    Behavior During Therapy [facility] for tasks assessed/performed              Past Medical History:  Diagnosis Date   Anxiety    panic attacks   ARF (acute renal failure) secondary to dehydration from osmotic diuresis 05/21/2013   KIDNEY FUNCTION NORMAL NOW   Arthritis    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    Cataracts, bilateral    DDD (degenerative disc disease)    DKA (diabetic ketoacidoses) 05/21/2013   TYPE 2   GERD (gastroesophageal reflux disease)    Headache    SINUS   High cholesterol    History of claustrophobia    Hypertension    Neuropathy 2015   feet   Obesity    Osteopenia    PONV (postoperative nausea and vomiting)    Past Surgical History:  Procedure Laterality Date   ABDOMINAL HYSTERECTOMY     COMPLETE   BREAST EXCISIONAL BIOPSY Left 04/20/2020   ALH   BREAST LUMPECTOMY WITH RADIOACTIVE SEED LOCALIZATION Left 01/04/2021   Procedure: LEFT BREAST LUMPECTOMY WITH RADIOACTIVE SEED LOCALIZATION X 3;  Surgeon: Griselda Miner, MD;  Location: [facility];  Service: General;  Laterality: Left;   BREAST SURGERY Left 04/20/2020   BX   JOINT REPLACEMENT     KNEE JOINT MANIPULATION Right    OVARIAN CYST AND OVARY REMOVED  YRS AGO   TOTAL KNEE ARTHROPLASTY Right 06/21/2017   Procedure: RIGHT TOTAL KNEE ARTHROPLASTY;  Surgeon: Jodi Geralds, MD;  Location: [facility];  Service:  Orthopedics;  Laterality: Right;   TOTAL KNEE ARTHROPLASTY Left 07/18/2020   Procedure: TOTAL KNEE ARTHROPLASTY;  Surgeon: Ollen Gross, MD;  Location: [facility];  Service: Orthopedics;  Laterality: Left;    TOTAL KNEE REVISION Right 07/22/2019   Procedure: Right knee polyethylene exchange;  Surgeon: Ollen Gross, MD;  Location: [facility];  Service: Orthopedics;  Laterality: Right;    Patient Active Problem List   Diagnosis Date Noted   Atypical lobular hyperplasia [facility]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 09/13/2020   Acute diverticulitis 08/15/2020   Hypomagnesemia 08/15/2020   Hypocalcemia 08/15/2020   Type 2 diabetes mellitus with hyperlipidemia (HCC) 08/15/2020   Anemia 08/15/2020   Failed total right knee replacement (HCC) 07/22/2019   Postoperative anemia due to acute blood loss 06/24/2017   Primary osteoarthritis of right knee 06/21/2017   Primary osteoarthritis of left knee 06/21/2017   DM (diabetes mellitus), secondary, uncontrolled, with ketoacidosis (HCC) 05/23/2013   Hypokalemia 05/22/2013   Obesity (BMI 30-39.9) 05/22/2013   DKA (diabetic ketoacidoses) 05/21/2013   Muscle spasm 05/21/2013   Hyponatremia 05/21/2013   ARF (acute renal failure) secondary to dehydration from osmotic diuresis 05/21/2013   HTN (hypertension) 05/21/2013   Hyperlipidemia 05/21/2013   GERD (gastroesophageal reflux disease) 05/21/2013   Infection due to trichomonas 05/21/2013  REFERRING DIAG: M63.817 (ICD-10-CM) - Presence of left artificial knee joint   THERAPY DIAG:  Abnormal posture  Chronic pain of right knee  Difficulty in walking, not elsewhere classified  Muscle weakness (generalized)  Unsteadiness on feet  Other abnormalities of gait and mobility  PERTINENT HISTORY: PERTINENT HISTORY: R TKA in 2019 or 2020 and revision done March 2021 in R, peripheral neuropathy, DDD, HTN   PRECAUTIONS: N/A  SUBJECTIVE: "I was really soar all weekend needed to take mt tramadol"  PAIN:  PAIN:   Are you having pain? Yes VAS scale: 4/10 Pain location: L knee; R knee stiff PAIN TYPE: stiff/tight Pain description: intermittent        OBJECTIVE:   TODAY'S TREATMENT:    03/06/21 Pt seen for aquatic therapy today.  Treatment took place in water 3.25-4 ft in depth at the [facility] pool. Temp of water was 92 deg.  Pt entered/exited the pool via stairs backward with bilat rail and assistive device (walker/rolling cart) once getting out of pool.     Warm up: waist deep fwd and back, marching, sidestepping 6x ea (yellow DB support bilat)     Exercises: Standing Fwd Step ups bottom step 10x R/L -side stepping R/L x10 -backward R/L x10  Vc, demonstration and TC for proper execution Squats bottom water step and 56ft x10 ea SL squat R/L x 10 63ft holding to wall SL squat R/L x 10 supported by wall then bilat ue hand held to improve execution. VC for full knee extension and focus of pushing up through heel  Gait training using hand buoys. Cues and demo for increased knee flex through swing.  Pt requires buoyancy for support and to offload joints with strengthening exercises. Viscosity of the water is needed for resistance of strengthening; water current perturbations provides challenge to standing balance unsupported, requiring increased core activation.   03/01/21 Pt seen for aquatic therapy today.  Treatment took place in water 3.25-4 ft in depth at the [facility] pool. Temp of water was 92 deg.  Pt entered/exited the pool via stairs backward with bilat rail and assistive device (walker/rolling cart) once getting out of pool.     Warm up: waist deep fwd and back, marching, sidestepping 6x ea (yellow DB support bilat)     Exercises: Standing Fwd Step ups bottom step 10x R/L -side stepping R/L x10 -backward R/L x10  Vc, demonstration and TC for proper execution Squats bottom water step and 69ft x10 ea SL squat R/L x 10 83ft holding to wall SL squat R/L  x 10 supported by wall then bilat ue hand held to improve execution. VC for full knee extension and focus of pushing up through heel  Gait training using hand buoys. Cues and demo for increased knee flex through swing.  Pt requires buoyancy for support and to offload joints with strengthening exercises. Viscosity of the water is needed for resistance of strengthening; water current perturbations provides challenge to standing balance unsupported, requiring increased core activation.03/01/21 Pt seen for aquatic therapy today.  Treatment took place in water 3.25-4 ft in depth at the [facility] pool. Temp of water was 92 deg.  Pt entered/exited the pool via stairs backward with bilat rail and assistive device (walker/rolling cart) once getting out of pool.     Warm up: waist deep fwd and back, marching, sidestepping 6x ea (yellow DB support bilat)     Exercises: Standing Fwd Step ups bottom step 10x R/L -side stepping R/L x10 -backward R/L  x10  Vc, demonstration and TC for proper execution Squats bottom water step and 19ft x10 ea SL squat R/L x 10 39ft holding to wall SL squat R/L x 10 supported by wall then bilat ue hand held to improve execution. VC for full knee extension and focus of pushing up through heel  Gait training using hand buoys. Cues and demo for increased knee flex through swing.  Pt requires buoyancy for support and to offload joints with strengthening exercises. Viscosity of the water is needed for resistance of strengthening; water current perturbations provides challenge to standing balance unsupported, requiring increased core activation.        Pt requires buoyancy for support and to offload joints with strengthening exercises. Viscosity of the water is needed for resistance of strengthening; water current perturbations provides challenge to standing balance unsupported, requiring increased core activation.  PATIENT EDUCATION:  Education details: anatomy,  exercise progression, DOMS expectations, muscle firing, relaxation, envelope of function, HEP, POC   Person educated: Patient Education method: Explanation, Demonstration, Tactile cues, Verbal cues, and Handouts Education comprehension: verbalized understanding, returned demonstration, verbal cues required, tactile cues required, and needs further education     HOME EXERCISE PROGRAM: Access Code: [email or web address] URL: [email or web address] Date: 02/08/2021 Prepared by: Zebedee Iba       ASSESSMENT:   CLINICAL IMPRESSION:  Focus on knee flex and heel strike with gait and VMO as well as proximal strength in LE.  Pt with increased pain in hamstrings and gastrocs over weekend. I explained probably secondary to exercises and focus of last treatment as muscle are weak. HHA with water walking to increase stride and cadence normalizing gait pattern.  Pt with difficulty self normalizing gait with small steps and constant VC needed for heel strike and toe off.    REHAB POTENTIAL: Fair     CLINICAL DECISION MAKING: Stable/uncomplicated   EVALUATION COMPLEXITY: Low     GOALS:     SHORT TERM GOALS:   STG Name Target Date Goal status  1 Pt will become independent with HEP in order to demonstrate synthesis of PT education.   :  02/22/2021 INITIAL  2 Pt will have an at least 9 pt improvement in LEFS measure in order to demonstrate MCID improvement in daily function.   :  03/08/2021 INITIAL  3 Pt will report at least 2 pt reduction on VAS scale for pain in order to demonstrate functional improvement with household activity, self care, and ADL.   : 03/08/2021 INITIAL    LONG TERM GOALS:    LTG Name Target Date Goal status  1 Pt  will become independent with final HEP in order to demonstrate synthesis of PT education.     04/05/2021 INITIAL  2 Pt will be able to demonstrate/report ability to walk >15s mins without pain in order to demonstrate functional improvement and tolerance to  exercise and community mobility.   04/05/2021 INITIAL  3 Pt will be able to demonstrate/report ability to sit/stand for >25 min periods of time without pain in order to demonstrate functional improvement and tolerance to static positioning.    04/05/2021 INITIAL  4 Pt will be able to demonstrate ability to walk without AD in order to demonstrate functional improvement in LE function for self-care and house hold duties.     04/05/2021 INITIAL    PLAN: PT FREQUENCY: 2x/week   PT DURATION: 8 weeks   PLANNED INTERVENTIONS: Therapeutic exercises, Therapeutic activity, Neuro Muscular re-education, Balance training, Gait training, Patient/Family education, Joint mobilization,  Stair training, Orthotic/Fit training, Aquatic Therapy, Dry Needling, Electrical stimulation, Spinal mobilization, Cryotherapy, Moist heat, scar mobilization, Taping, Vasopneumatic device, Traction, Ultrasound, Ionotophoresis 4mg /ml Dexamethasone, and Manual therapy   PLAN FOR NEXT SESSION: knee flexion stretching, VMO strength     Samuel Rittenhouse (Frankie) Lekisha Mcghee MPT 03/07/21 3:52 PM

## 2021-03-08 ENCOUNTER — Other Ambulatory Visit: Payer: Self-pay

## 2021-03-08 ENCOUNTER — Ambulatory Visit (HOSPITAL_BASED_OUTPATIENT_CLINIC_OR_DEPARTMENT_OTHER): Payer: Medicare HMO | Attending: Orthopedic Surgery | Admitting: Physical Therapy

## 2021-03-08 DIAGNOSIS — R2681 Unsteadiness on feet: Secondary | ICD-10-CM

## 2021-03-08 DIAGNOSIS — R293 Abnormal posture: Secondary | ICD-10-CM

## 2021-03-08 DIAGNOSIS — M25562 Pain in left knee: Secondary | ICD-10-CM | POA: Diagnosis present

## 2021-03-08 DIAGNOSIS — G8929 Other chronic pain: Secondary | ICD-10-CM

## 2021-03-08 DIAGNOSIS — R2689 Other abnormalities of gait and mobility: Secondary | ICD-10-CM | POA: Diagnosis present

## 2021-03-08 DIAGNOSIS — M25561 Pain in right knee: Secondary | ICD-10-CM | POA: Diagnosis present

## 2021-03-08 DIAGNOSIS — R262 Difficulty in walking, not elsewhere classified: Secondary | ICD-10-CM | POA: Diagnosis present

## 2021-03-08 DIAGNOSIS — M6281 Muscle weakness (generalized): Secondary | ICD-10-CM

## 2021-03-08 NOTE — Therapy (Signed)
OUTPATIENT PHYSICAL THERAPY TREATMENT NOTE   Patient Name: Connie Branch MRN: [id number] DOB:[birth year], 70 y.o., female Today's Date: 03/08/2021  PCP: Sigmund Hazel, MD REFERRING PROVIDER: Sigmund Hazel, MD   PT End of Session - 03/08/21 0958     Visit Number 9    Number of Visits 16    Date for PT Re-Evaluation 05/09/21    Authorization Type [employer] Medicare    PT Start Time [phone]    PT Stop Time 1033    PT Time Calculation (min) 47 min    Equipment Utilized During Treatment Other (comment)    Activity Tolerance Patient tolerated treatment well    Behavior During Therapy [facility] for tasks assessed/performed              Past Medical History:  Diagnosis Date   Anxiety    panic attacks   ARF (acute renal failure) secondary to dehydration from osmotic diuresis 05/21/2013   KIDNEY FUNCTION NORMAL NOW   Arthritis    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    Cataracts, bilateral    DDD (degenerative disc disease)    DKA (diabetic ketoacidoses) 05/21/2013   TYPE 2   GERD (gastroesophageal reflux disease)    Headache    SINUS   High cholesterol    History of claustrophobia    Hypertension    Neuropathy 2015   feet   Obesity    Osteopenia    PONV (postoperative nausea and vomiting)    Past Surgical History:  Procedure Laterality Date   ABDOMINAL HYSTERECTOMY     COMPLETE   BREAST EXCISIONAL BIOPSY Left 04/20/2020   ALH   BREAST LUMPECTOMY WITH RADIOACTIVE SEED LOCALIZATION Left 01/04/2021   Procedure: LEFT BREAST LUMPECTOMY WITH RADIOACTIVE SEED LOCALIZATION X 3;  Surgeon: Griselda Miner, MD;  Location: [facility];  Service: General;  Laterality: Left;   BREAST SURGERY Left 04/20/2020   BX   JOINT REPLACEMENT     KNEE JOINT MANIPULATION Right    OVARIAN CYST AND OVARY REMOVED  YRS AGO   TOTAL KNEE ARTHROPLASTY Right 06/21/2017   Procedure: RIGHT TOTAL KNEE ARTHROPLASTY;  Surgeon: Jodi Geralds, MD;  Location: [facility];  Service:  Orthopedics;  Laterality: Right;   TOTAL KNEE ARTHROPLASTY Left 07/18/2020   Procedure: TOTAL KNEE ARTHROPLASTY;  Surgeon: Ollen Gross, MD;  Location: [facility];  Service: Orthopedics;  Laterality: Left;    TOTAL KNEE REVISION Right 07/22/2019   Procedure: Right knee polyethylene exchange;  Surgeon: Ollen Gross, MD;  Location: [facility];  Service: Orthopedics;  Laterality: Right;    Patient Active Problem List   Diagnosis Date Noted   Atypical lobular hyperplasia [facility]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 09/13/2020   Acute diverticulitis 08/15/2020   Hypomagnesemia 08/15/2020   Hypocalcemia 08/15/2020   Type 2 diabetes mellitus with hyperlipidemia (HCC) 08/15/2020   Anemia 08/15/2020   Failed total right knee replacement (HCC) 07/22/2019   Postoperative anemia due to acute blood loss 06/24/2017   Primary osteoarthritis of right knee 06/21/2017   Primary osteoarthritis of left knee 06/21/2017   DM (diabetes mellitus), secondary, uncontrolled, with ketoacidosis (HCC) 05/23/2013   Hypokalemia 05/22/2013   Obesity (BMI 30-39.9) 05/22/2013   DKA (diabetic ketoacidoses) 05/21/2013   Muscle spasm 05/21/2013   Hyponatremia 05/21/2013   ARF (acute renal failure) secondary to dehydration from osmotic diuresis 05/21/2013   HTN (hypertension) 05/21/2013   Hyperlipidemia 05/21/2013   GERD (gastroesophageal reflux disease) 05/21/2013   Infection due to trichomonas 05/21/2013  REFERRING DIAG: N39.767 (ICD-10-CM) - Presence of left artificial knee joint   THERAPY DIAG:  Abnormal posture  Other abnormalities of gait and mobility  Unsteadiness on feet  Chronic pain of right knee  Difficulty in walking, not elsewhere classified  Muscle weakness (generalized)  PERTINENT HISTORY: PERTINENT HISTORY: R TKA in 2019 or 2020 and revision done March 2021 in R, peripheral neuropathy, DDD, HTN   PRECAUTIONS: N/A  SUBJECTIVE: "I wasn't real soar after last treatment.  I did walk in the water at the  [facility] for 2 hours yesterday""  PAIN:  PAIN:  Are you having pain? Yes VAS scale: 4/10 03/08/21 Pain location: L knee; R knee stiff PAIN TYPE: stiff/tight Pain description: intermittent        OBJECTIVE:   TODAY'S TREATMENT:    03/08/21 Pt seen for aquatic therapy today.  Treatment took place in water 3.25-4 ft in depth at the [facility] pool. Temp of water was 92 deg.  Pt entered/exited the pool via stairs backward with bilat rail and assistive device (walker/rolling cart) once getting out of pool.   Warm up: waist deep fwd and back, marching, sidestepping 6x ea (yellow DB support bilat)  Gait training  Multiple widths of pool with HHA to increased cadence and step length.  VC and demonstration on proper execution along with using arm swing for balance.  Pt amb without hand buoys with apprehension but gains confidence as session continues.  Backward amb to encourage weight shift and knee flex. Cues for toe/heel for proper foot strike.  Exercises Step ups backward decreasing ue support. VC and tc for weight shifting.  Side step ups hha for weight shift. Cues for proper technique/not leaning with shoulder but shifting hip over knee and ankle before rising VMO strengthening on bottom step x 10 reps.    Pt requires buoyancy for support and to offload joints with strengthening exercises. Viscosity of the water is needed for resistance of strengthening; water current perturbations provides challenge to standing balance unsupported, requiring increased core activation.  03/06/21 Pt seen for aquatic therapy today.  Treatment took place in water 3.25-4 ft in depth at the [facility] pool. Temp of water was 92 deg.  Pt entered/exited the pool via stairs backward with bilat rail and assistive device (walker/rolling cart) once getting out of pool.     Warm up: waist deep fwd and back, marching, sidestepping 6x ea (yellow DB support bilat)   Exercises: Standing Fwd Step  ups bottom step 10x R/L -side stepping R/L x10 -backward R/L x10  Vc, demonstration and TC for proper execution Squats bottom water step and 61ft x10 ea SL squat R/L x 10 56ft holding to wall SL squat R/L x 10 supported by wall then bilat ue hand held to improve execution. VC for full knee extension and focus of pushing up through heel  Gait training using hand buoys. Cues and demo for increased knee flex through swing.  Pt requires buoyancy for support and to offload joints with strengthening exercises. Viscosity of the water is needed for resistance of strengthening; water current perturbations provides challenge to standing balance unsupported, requiring increased core activation.   PATIENT EDUCATION:  Education details: anatomy, exercise progression, DOMS expectations, muscle firing, relaxation, envelope of function, HEP, POC   Person educated: Patient Education method: Explanation, Demonstration, Tactile cues, Verbal cues, and Handouts Education comprehension: verbalized understanding, returned demonstration, verbal cues required, tactile cues required, and needs further education     HOME EXERCISE PROGRAM: Access  Code: [email or web address] URL: [email or web address] Date: 02/08/2021 Prepared by: Zebedee Iba       ASSESSMENT:   CLINICAL IMPRESSION:  Focus upon initiation of session is improving gait pattern. HHA forward "pulling" pt increasing step length as well as speed and improving heel/toe strike and knee flex through swing.  Took yellow hand buoys away from pt as it was pushing her into forward hip flex with gait.  Pt demonstrates significant improvement in gait pattern with cues for arm swing and long step/increasing speed.  Worked on backward amb with more difficulty gaining normal gait. Need vc for confidence and demonstration for weight shift with step ups backward and side.    REHAB POTENTIAL: Fair     CLINICAL DECISION MAKING: Stable/uncomplicated   EVALUATION  COMPLEXITY: Low     GOALS:     SHORT TERM GOALS:   STG Name Target Date Goal status  1 Pt will become independent with HEP in order to demonstrate synthesis of PT education.   :  02/22/2021 INITIAL  2 Pt will have an at least 9 pt improvement in LEFS measure in order to demonstrate MCID improvement in daily function.   :  03/08/2021 INITIAL  3 Pt will report at least 2 pt reduction on VAS scale for pain in order to demonstrate functional improvement with household activity, self care, and ADL.   : 03/08/2021 INITIAL    LONG TERM GOALS:    LTG Name Target Date Goal status  1 Pt  will become independent with final HEP in order to demonstrate synthesis of PT education.     04/05/2021 INITIAL  2 Pt will be able to demonstrate/report ability to walk >15s mins without pain in order to demonstrate functional improvement and tolerance to exercise and community mobility.   04/05/2021 INITIAL  3 Pt will be able to demonstrate/report ability to sit/stand for >25 min periods of time without pain in order to demonstrate functional improvement and tolerance to static positioning.    04/05/2021 INITIAL  4 Pt will be able to demonstrate ability to walk without AD in order to demonstrate functional improvement in LE function for self-care and house hold duties.     04/05/2021 INITIAL    PLAN: PT FREQUENCY: 2x/week   PT DURATION: 8 weeks   PLANNED INTERVENTIONS: Therapeutic exercises, Therapeutic activity, Neuro Muscular re-education, Balance training, Gait training, Patient/Family education, Joint mobilization, Stair training, Orthotic/Fit training, Aquatic Therapy, Dry Needling, Electrical stimulation, Spinal mobilization, Cryotherapy, Moist heat, scar mobilization, Taping, Vasopneumatic device, Traction, Ultrasound, Ionotophoresis 4mg /ml Dexamethasone, and Manual therapy   PLAN FOR NEXT SESSION: gait without ue support     Corrie Dandy) Tabbetha Kutscher MPT 03/08/21 1:56 PM

## 2021-03-13 ENCOUNTER — Ambulatory Visit (HOSPITAL_BASED_OUTPATIENT_CLINIC_OR_DEPARTMENT_OTHER): Payer: Medicare HMO | Admitting: Physical Therapy

## 2021-03-13 ENCOUNTER — Other Ambulatory Visit: Payer: Self-pay

## 2021-03-13 ENCOUNTER — Encounter (HOSPITAL_BASED_OUTPATIENT_CLINIC_OR_DEPARTMENT_OTHER): Payer: Self-pay | Admitting: Physical Therapy

## 2021-03-13 DIAGNOSIS — R262 Difficulty in walking, not elsewhere classified: Secondary | ICD-10-CM

## 2021-03-13 DIAGNOSIS — R2681 Unsteadiness on feet: Secondary | ICD-10-CM

## 2021-03-13 DIAGNOSIS — R293 Abnormal posture: Secondary | ICD-10-CM | POA: Diagnosis not present

## 2021-03-13 DIAGNOSIS — M25562 Pain in left knee: Secondary | ICD-10-CM

## 2021-03-13 DIAGNOSIS — M6281 Muscle weakness (generalized): Secondary | ICD-10-CM

## 2021-03-13 DIAGNOSIS — G8929 Other chronic pain: Secondary | ICD-10-CM

## 2021-03-13 NOTE — Therapy (Signed)
OUTPATIENT PHYSICAL THERAPY PROGRESS NOTE   Patient Name: Connie Branch MRN: [id number] DOB:24-Jul-1950, 70 y.o., female Today's Date: 03/13/2021  PCP: Kathyrn Lass, MD REFERRING PROVIDER: Kathyrn Lass, MD   PT End of Session - 03/13/21 0918     Visit Number 10    Number of Visits 16    Date for PT Re-Evaluation 05/09/21    Authorization Type [employer] Medicare    PT Start Time 0930    PT Stop Time 1015    PT Time Calculation (min) 45 min    Equipment Utilized During Treatment Other (comment)    Activity Tolerance Patient tolerated treatment well    Behavior During Therapy [facility] for tasks assessed/performed              Past Medical History:  Diagnosis Date   Anxiety    panic attacks   ARF (acute renal failure) secondary to dehydration from osmotic diuresis 05/21/2013   KIDNEY FUNCTION NORMAL NOW   Arthritis    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    Cataracts, bilateral    DDD (degenerative disc disease)    DKA (diabetic ketoacidoses) 05/21/2013   TYPE 2   GERD (gastroesophageal reflux disease)    Headache    SINUS   High cholesterol    History of claustrophobia    Hypertension    Neuropathy 2015   feet   Obesity    Osteopenia    PONV (postoperative nausea and vomiting)    Past Surgical History:  Procedure Laterality Date   ABDOMINAL HYSTERECTOMY     COMPLETE   BREAST EXCISIONAL BIOPSY Left 04/20/2020   ALH   BREAST LUMPECTOMY WITH RADIOACTIVE SEED LOCALIZATION Left 01/04/2021   Procedure: LEFT BREAST LUMPECTOMY WITH RADIOACTIVE SEED LOCALIZATION X 3;  Surgeon: Jovita Kussmaul, MD;  Location: [place];  Service: General;  Laterality: Left;   BREAST SURGERY Left 04/20/2020   BX   JOINT REPLACEMENT     KNEE JOINT MANIPULATION Right    OVARIAN CYST AND OVARY REMOVED  YRS AGO   TOTAL KNEE ARTHROPLASTY Right 06/21/2017   Procedure: RIGHT TOTAL KNEE ARTHROPLASTY;  Surgeon: Dorna Leitz, MD;  Location: [facility];  Service:  Orthopedics;  Laterality: Right;   TOTAL KNEE ARTHROPLASTY Left 07/18/2020   Procedure: TOTAL KNEE ARTHROPLASTY;  Surgeon: Gaynelle Arabian, MD;  Location: [facility];  Service: Orthopedics;  Laterality: Left;  67min   TOTAL KNEE REVISION Right 07/22/2019   Procedure: Right knee polyethylene exchange;  Surgeon: Gaynelle Arabian, MD;  Location: [facility];  Service: Orthopedics;  Laterality: Right;  135min   Patient Active Problem List   Diagnosis Date Noted   Atypical lobular hyperplasia [facility]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 09/13/2020   Acute diverticulitis 08/15/2020   Hypomagnesemia 08/15/2020   Hypocalcemia 08/15/2020   Type 2 diabetes mellitus with hyperlipidemia ([place]) 08/15/2020   Anemia 08/15/2020   Failed total right knee replacement ([place]) 07/22/2019   Postoperative anemia due to acute blood loss 06/24/2017   Primary osteoarthritis of right knee 06/21/2017   Primary osteoarthritis of left knee 06/21/2017   DM (diabetes mellitus), secondary, uncontrolled, with ketoacidosis ([place]) 05/23/2013   Hypokalemia 05/22/2013   Obesity (BMI 30-39.9) 05/22/2013   DKA (diabetic ketoacidoses) 05/21/2013   Muscle spasm 05/21/2013   Hyponatremia 05/21/2013   ARF (acute renal failure) secondary to dehydration from osmotic diuresis 05/21/2013   HTN (hypertension) 05/21/2013   Hyperlipidemia 05/21/2013   GERD (gastroesophageal reflux disease) 05/21/2013   Infection due to trichomonas 05/21/2013  REFERRING DIAG: W09.811 (ICD-10-CM) - Presence of left artificial knee joint   THERAPY DIAG:  Unsteadiness on feet  Chronic pain of right knee  Difficulty in walking, not elsewhere classified  Muscle weakness (generalized)  Pain in joint of left knee  PERTINENT HISTORY: PERTINENT HISTORY: R TKA in 2019 or 2020 and revision done March 2021 in R, peripheral neuropathy, DDD, HTN   PRECAUTIONS: N/A  SUBJECTIVE: Pt states the L knee medial pain has increased since last time. She states it is "nerve pain."  PAIN:  Are  you having pain? Yes VAS scale: 5/10 03/08/21 Pain location: L knee; R knee stiff PAIN TYPE: stiff/tight Pain description: intermittent     OBJECTIVE:   LE AROM/PROM:   A/PROM Right 02/08/2021 Left 02/08/2021  Knee flexion WFL 118  Knee extension WFL -1   (Blank rows = not tested)   -Able to perform stairs with reciprocal gait pattern and single UE GAIT: decreased step length on land but able to perform without UE support/cart when cued. Modified independence   TODAY'S TREATMENT:  03/13/21 Pt seen for aquatic therapy today.  Treatment took place in water 3.25-4 ft in depth at the [facility] pool. Temp of water was 94 deg.  Pt entered/exited the pool via stairs backward with bilat rail and assistive device (walker/rolling cart) once getting out of pool.   Gait: waist deep fwd and back, marching, sidestepping 6x ea (no UE)- cuing given for lengthening stride, arm swing, heel toe gait, and reduction of stiff knee/guarding  Exercises Fwd, lateral  2x10 (cued for hip extension and quad activation, single UE support with cuing for reduced pulling) Double UE support squat at edge of pool 2x10 Standing march single UE support 20x Seated cylcing 20x retro and fwd 2x Standing squat with yellow noodle around chest 3x10 (no holding onto pool edge, cuing for hip extension)    Pt requires buoyancy for support and to offload joints with strengthening exercises. Viscosity of the water is needed for resistance of strengthening; water current perturbations provides challenge to standing balance unsupported, requiring increased core activation.    03/08/21 Pt seen for aquatic therapy today.  Treatment took place in water 3.25-4 ft in depth at the [facility] pool. Temp of water was 92 deg.  Pt entered/exited the pool via stairs backward with bilat rail and assistive device (walker/rolling cart) once getting out of pool.   Warm up: waist deep fwd and back, marching, sidestepping  6x ea (yellow DB support bilat)  Gait training  Multiple widths of pool with HHA to increased cadence and step length.  VC and demonstration on proper execution along with using arm swing for balance.  Pt amb without hand buoys with apprehension but gains confidence as session continues.  Backward amb to encourage weight shift and knee flex. Cues for toe/heel for proper foot strike.  Exercises Step ups backward decreasing ue support. VC and tc for weight shifting.  Side step ups hha for weight shift. Cues for proper technique/not leaning with shoulder but shifting hip over knee and ankle before rising VMO strengthening on bottom step x 10 reps.    Pt requires buoyancy for support and to offload joints with strengthening exercises. Viscosity of the water is needed for resistance of strengthening; water current perturbations provides challenge to standing balance unsupported, requiring increased core activation.    PATIENT EDUCATION:  Education details: anatomy, exercise progression, DOMS expectations, muscle firing, relaxation, envelope of function, HEP, POC   Person educated: Patient  Education method: Explanation, Demonstration, Tactile cues, Verbal cues, and Handouts Education comprehension: verbalized understanding, returned demonstration, verbal cues required, tactile cues required, and needs further education     HOME EXERCISE PROGRAM: Access Code: [email or web address] URL: [email or web address] Date: 02/08/2021 Prepared by: Daleen Bo       ASSESSMENT:   CLINICAL IMPRESSION:  Pt progressing well today, demonstrating gait in multiple directions without UE support. Pt demonstrates decreased apprehension but does require reinforcement throughout for normal step length. Pt able to perform CKC loaded exercise without pain and reports decreased stiffness and pain by end of session. Pt still with continued knee joint stiffness during gait and exercise though does improve as pt  begins to relax and reduce anxiety with movement. Due to scheduling, pt to have remaining objective strength and LEFS measures taken at next land based visit. Pt has improved functional knee flexion and ext ROM as well as ability to climb stairs with reciprocal pattern. Pt would benefit from continued skilled therapy in order to reach goals and maximize functional bilat knee strength and ROM for full return to PLOF.     REHAB POTENTIAL: Fair     CLINICAL DECISION MAKING: Stable/uncomplicated   EVALUATION COMPLEXITY: Low     GOALS:     SHORT TERM GOALS:   STG Name Target Date Goal status  1 Pt will become independent with HEP in order to demonstrate synthesis of PT education.   :  02/22/2021 Ongoing  2 Pt will have an at least 9 pt improvement in LEFS measure in order to demonstrate MCID improvement in daily function.   :  03/08/2021 INITIAL  3 Pt will report at least 2 pt reduction on VAS scale for pain in order to demonstrate functional improvement with household activity, self care, and ADL.   : 03/08/2021 Met    LONG TERM GOALS:    LTG Name Target Date Goal status  1 Pt  will become independent with final HEP in order to demonstrate synthesis of PT education.     04/05/2021 ongoing  2 Pt will be able to demonstrate/report ability to walk >15 mins without pain in order to demonstrate functional improvement and tolerance to exercise and community mobility.   04/05/2021 ongoing  3 Pt will be able to demonstrate/report ability to sit/stand for >25 min periods of time without pain in order to demonstrate functional improvement and tolerance to static positioning.    04/05/2021 ongoing  4 Pt will be able to demonstrate ability to walk without AD in order to demonstrate functional improvement in LE function for self-care and house hold duties.     04/05/2021 ongoing    PLAN: PT FREQUENCY: 2x/week   PT DURATION: 8 weeks   PLANNED INTERVENTIONS: Therapeutic exercises, Therapeutic  activity, Neuro Muscular re-education, Balance training, Gait training, Patient/Family education, Joint mobilization, Stair training, Orthotic/Fit training, Aquatic Therapy, Dry Needling, Electrical stimulation, Spinal mobilization, Cryotherapy, Moist heat, scar mobilization, Taping, Vasopneumatic device, Traction, Ultrasound, Ionotophoresis 4mg /ml Dexamethasone, and Manual therapy   PLAN FOR NEXT SESSION: continue gait without ue support, trial SPC, take objective measures and LEFS   Daleen Bo PT, DPT 03/13/21 10:52 AM

## 2021-03-15 ENCOUNTER — Ambulatory Visit (HOSPITAL_BASED_OUTPATIENT_CLINIC_OR_DEPARTMENT_OTHER): Payer: Medicare HMO | Admitting: Physical Therapy

## 2021-03-15 ENCOUNTER — Other Ambulatory Visit: Payer: Self-pay

## 2021-03-15 ENCOUNTER — Encounter (HOSPITAL_BASED_OUTPATIENT_CLINIC_OR_DEPARTMENT_OTHER): Payer: Self-pay | Admitting: Physical Therapy

## 2021-03-15 DIAGNOSIS — R2681 Unsteadiness on feet: Secondary | ICD-10-CM

## 2021-03-15 DIAGNOSIS — M25561 Pain in right knee: Secondary | ICD-10-CM

## 2021-03-15 DIAGNOSIS — R293 Abnormal posture: Secondary | ICD-10-CM | POA: Diagnosis not present

## 2021-03-15 DIAGNOSIS — G8929 Other chronic pain: Secondary | ICD-10-CM

## 2021-03-15 DIAGNOSIS — R262 Difficulty in walking, not elsewhere classified: Secondary | ICD-10-CM

## 2021-03-15 DIAGNOSIS — M6281 Muscle weakness (generalized): Secondary | ICD-10-CM

## 2021-03-15 DIAGNOSIS — M25562 Pain in left knee: Secondary | ICD-10-CM

## 2021-03-15 NOTE — Therapy (Signed)
OUTPATIENT PHYSICAL THERAPY TREATMENT NOTE   Patient Name: Connie Branch MRN: [id number] DOB:07-29-1950, 70 y.o., female Today's Date: 03/15/2021  PCP: Kathyrn Lass, MD REFERRING PROVIDER: Kathyrn Lass, MD   PT End of Session - 03/15/21 1154     Visit Number 11    Number of Visits 16    Date for PT Re-Evaluation 05/09/21    Authorization Type Aetna Medicare    PT Start Time 4970    PT Stop Time 1227    PT Time Calculation (min) 41 min    Equipment Utilized During Treatment Other (comment)    Activity Tolerance Patient tolerated treatment well    Behavior During Therapy [facility] for tasks assessed/performed              Past Medical History:  Diagnosis Date   Anxiety    panic attacks   ARF (acute renal failure) secondary to dehydration from osmotic diuresis 05/21/2013   KIDNEY FUNCTION NORMAL NOW   Arthritis    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    Cataracts, bilateral    DDD (degenerative disc disease)    DKA (diabetic ketoacidoses) 05/21/2013   TYPE 2   GERD (gastroesophageal reflux disease)    Headache    SINUS   High cholesterol    History of claustrophobia    Hypertension    Neuropathy 2015   feet   Obesity    Osteopenia    PONV (postoperative nausea and vomiting)    Past Surgical History:  Procedure Laterality Date   ABDOMINAL HYSTERECTOMY     COMPLETE   BREAST EXCISIONAL BIOPSY Left 04/20/2020   ALH   BREAST LUMPECTOMY WITH RADIOACTIVE SEED LOCALIZATION Left 01/04/2021   Procedure: LEFT BREAST LUMPECTOMY WITH RADIOACTIVE SEED LOCALIZATION X 3;  Surgeon: Jovita Kussmaul, MD;  Location: [place];  Service: General;  Laterality: Left;   BREAST SURGERY Left 04/20/2020   BX   JOINT REPLACEMENT     KNEE JOINT MANIPULATION Right    OVARIAN CYST AND OVARY REMOVED  YRS AGO   TOTAL KNEE ARTHROPLASTY Right 06/21/2017   Procedure: RIGHT TOTAL KNEE ARTHROPLASTY;  Surgeon: Dorna Leitz, MD;  [facility];  Service:  Orthopedics;  Laterality: Right;   TOTAL KNEE ARTHROPLASTY Left 07/18/2020   Procedure: TOTAL KNEE ARTHROPLASTY;  Surgeon: Gaynelle Arabian, MD;  Location[facility];  Service: Orthopedics;  Laterality: Left;  59mn   TOTAL KNEE REVISION Right 07/22/2019   Procedure: Right knee polyethylene exchange;  Surgeon: AGaynelle Arabian MD;  [facility];  Service: Orthopedics;  Laterality: Right;  1254m   Patient Active Problem List   Diagnosis Date Noted   [facility] 09/13/2020   Acute diverticulitis 08/15/2020   Hypomagnesemia 08/15/2020   Hypocalcemia 08/15/2020   Type 2 diabetes mellitus with hyperlipidemia ([facility]04/03/2021   Anemia 08/15/2020   Failed total right knee replacement (HCH[facility]03/17/2021   Postoperative anemia due to acute blood loss 06/24/2017   Primary osteoarthritis of right knee 06/21/2017   Primary osteoarthritis of left knee 06/21/2017   DM (diabetes mellitus), secondary, uncontrolled, with ketoacidosis ([facility]01/17/2015   Hypokalemia 05/22/2013   Obesity (BMI 30-39.9) 05/22/2013   DKA (diabetic ketoacidoses) 05/21/2013   Muscle spasm 05/21/2013   Hyponatremia 05/21/2013   ARF (acute renal failure) secondary to dehydration from osmotic diuresis 05/21/2013   HTN (hypertension) 05/21/2013   Hyperlipidemia 05/21/2013   GERD (gastroesophageal reflux disease) 05/21/2013   Infection due to trichomonas 05/21/2013  REFERRING DIAG: E31.540 (ICD-10-CM) - Presence of left artificial knee joint   THERAPY DIAG:  Unsteadiness on feet  Chronic pain of right knee  Difficulty in walking, not elsewhere classified  Muscle weakness (generalized)  Pain in joint of left knee  PERTINENT HISTORY: PERTINENT HISTORY: R TKA in 2019 or 2020 and revision done March 2021 in R, peripheral neuropathy, DDD, HTN   PRECAUTIONS: N/A  SUBJECTIVE: Pt states her knees are very stiff today. She feels that her pain is still consistent. She states she has  tried heel toe walking and "notices a difference." She reports doing HEP 1x/week.   PAIN:  Are you having pain? Yes VAS scale: 3/10  Pain location: L knee; R knee stiff PAIN TYPE: stiff/tight Pain description: intermittent     OBJECTIVE:   Lower Extremity Functional Score: 38 / 80 = 47.5 %  5XSTS 19.6s   GAIT: Mod. Indep with SPC, SBA with outdoor walking on turf surface and well as with curb navigation; independent walking inside clinic on level surface  TODAY'S TREATMENT:  03/15/21 Gait training with [place]; cuing needed for heel strike and toe off, proper sequencing with level ground, turf surface, sloped surface, and curb/step management- outdoor walking in courtyard area of [place] facility (gait belt used) STS 5x Supine strap AAROM knee flexion 10s 10x LAQ 20x  03/13/21 Pt seen for aquatic therapy today.  Treatment took place in water 3.25-4 ft in depth at the [facility] pool. Temp of water was 94 deg.  Pt entered/exited the pool via stairs backward with bilat rail and assistive device (walker/rolling cart) once getting out of pool.   Gait: waist deep fwd and back, marching, sidestepping 6x ea (no UE)- cuing given for lengthening stride, arm swing, heel toe gait, and reduction of stiff knee/guarding  Exercises Fwd, lateral  2x10 (cued for hip extension and quad activation, single UE support with cuing for reduced pulling) Double UE support squat at edge of pool 2x10 Standing march single UE support 20x Seated cylcing 20x retro and fwd 2x Standing squat with yellow noodle around chest 3x10 (no holding onto pool edge, cuing for hip extension)    Pt requires buoyancy for support and to offload joints with strengthening exercises. Viscosity of the water is needed for resistance of strengthening; water current perturbations provides challenge to standing balance unsupported, requiring increased core activation.    03/08/21 Pt seen for aquatic therapy today.   Treatment took place in water 3.25-4 ft in depth at the [facility] pool. Temp of water was 92 deg.  Pt entered/exited the pool via stairs backward with bilat rail and assistive device (walker/rolling cart) once getting out of pool.   Warm up: waist deep fwd and back, marching, sidestepping 6x ea (yellow DB support bilat)  Gait training  Multiple widths of pool with HHA to increased cadence and step length.  VC and demonstration on proper execution along with using arm swing for balance.  Pt amb without hand buoys with apprehension but gains confidence as session continues.  Backward amb to encourage weight shift and knee flex. Cues for toe/heel for proper foot strike.  Exercises Step ups backward decreasing ue support. VC and tc for weight shifting.  Side step ups hha for weight shift. Cues for proper technique/not leaning with shoulder but shifting hip over knee and ankle before rising VMO strengthening on bottom step x 10 reps.    Pt requires buoyancy for support and to offload joints with strengthening exercises. Viscosity of  the water is needed for resistance of strengthening; water current perturbations provides challenge to standing balance unsupported, requiring increased core activation.    PATIENT EDUCATION:  Education details: anatomy, exercise progression, DOMS expectations, muscle firing, relaxation, envelope of function, HEP, POC   Person educated: Patient Education method: Explanation, Demonstration, Tactile cues, Verbal cues, and Handouts Education comprehension: verbalized understanding, returned demonstration, verbal cues required, tactile cues required, and needs further education     HOME EXERCISE PROGRAM: Access Code: [email or web address] URL: [email or web address] Date: 02/08/2021 Prepared by: Daleen Bo       ASSESSMENT:   CLINICAL IMPRESSION:  Pt able to progress today to outdoor walking with [facility] and no AD indoors. Pt required continued cuing for  sequencing during curb management but was able to return demo without reinforcement by end of session. HEP updated and modified to promote better compliance at home and discussed integration of HEP at [facility]. Pt progressing well when given encouragement and reassurance of safety. Pt is now able to perform STS without UE support from chair surface. Eccentric lowering during stair management still weak and painful with full size step. Plan to continue with gait training and strengthening in pool and on land. Plan to alternate aquatic and land visits moving forward to promote transition to land. Pt would benefit from continued skilled therapy in order to reach goals and maximize functional bilat knee strength and ROM for full return to PLOF.     REHAB POTENTIAL: Fair     CLINICAL DECISION MAKING: Stable/uncomplicated   EVALUATION COMPLEXITY: Low     GOALS:     SHORT TERM GOALS:   STG Name Target Date Goal status  1 Pt will become independent with HEP in order to demonstrate synthesis of PT education.   :  02/22/2021 Ongoing  2 Pt will have an at least 9 pt improvement in LEFS measure in order to demonstrate MCID improvement in daily function.   :  03/08/2021 ongoing  3 Pt will report at least 2 pt reduction on VAS scale for pain in order to demonstrate functional improvement with household activity, self care, and ADL.   : 03/08/2021 Met    LONG TERM GOALS:    LTG Name Target Date Goal status  1 Pt  will become independent with final HEP in order to demonstrate synthesis of PT education.     04/05/2021 ongoing  2 Pt will be able to demonstrate/report ability to walk >15 mins without pain in order to demonstrate functional improvement and tolerance to exercise and community mobility.   04/05/2021 ongoing  3 Pt will be able to demonstrate/report ability to sit/stand for >25 min periods of time without pain in order to demonstrate functional improvement and tolerance to static positioning.     04/05/2021 ongoing  4 Pt will be able to demonstrate ability to walk without AD in order to demonstrate functional improvement in LE function for self-care and house hold duties.     04/05/2021 ongoing    PLAN: PT FREQUENCY: 2x/week   PT DURATION: 8 weeks   PLANNED INTERVENTIONS: Therapeutic exercises, Therapeutic activity, Neuro Muscular re-education, Balance training, Gait training, Patient/Family education, Joint mobilization, Stair training, Orthotic/Fit training, Aquatic Therapy, Dry Needling, Electrical stimulation, Spinal mobilization, Cryotherapy, Moist heat, scar mobilization, Taping, Vasopneumatic device, Traction, Ultrasound, Ionotophoresis 77m/ml Dexamethasone, and Manual therapy   PLAN FOR NEXT SESSION: continue gait training with SPC on land, LE strength in pool,   ADaleen BoPT, DPT 03/15/21 12:39 PM

## 2021-03-20 ENCOUNTER — Ambulatory Visit (HOSPITAL_BASED_OUTPATIENT_CLINIC_OR_DEPARTMENT_OTHER): Payer: Medicare HMO | Admitting: Physical Therapy

## 2021-03-21 ENCOUNTER — Encounter (HOSPITAL_BASED_OUTPATIENT_CLINIC_OR_DEPARTMENT_OTHER): Payer: Self-pay | Admitting: Physical Therapy

## 2021-03-21 ENCOUNTER — Ambulatory Visit (HOSPITAL_BASED_OUTPATIENT_CLINIC_OR_DEPARTMENT_OTHER): Payer: Medicare HMO | Admitting: Physical Therapy

## 2021-03-21 ENCOUNTER — Other Ambulatory Visit: Payer: Self-pay

## 2021-03-21 DIAGNOSIS — G8929 Other chronic pain: Secondary | ICD-10-CM

## 2021-03-21 DIAGNOSIS — R262 Difficulty in walking, not elsewhere classified: Secondary | ICD-10-CM

## 2021-03-21 DIAGNOSIS — R293 Abnormal posture: Secondary | ICD-10-CM | POA: Diagnosis not present

## 2021-03-21 DIAGNOSIS — M6281 Muscle weakness (generalized): Secondary | ICD-10-CM

## 2021-03-21 DIAGNOSIS — R2689 Other abnormalities of gait and mobility: Secondary | ICD-10-CM

## 2021-03-21 DIAGNOSIS — R2681 Unsteadiness on feet: Secondary | ICD-10-CM

## 2021-03-21 NOTE — Therapy (Signed)
OUTPATIENT PHYSICAL THERAPY TREATMENT NOTE   Patient Name: Connie Branch MRN: [id number] DOB:12-08-1950, 70 y.o., female Today's Date: 03/21/2021  PCP: Kathyrn Lass, MD REFERRING PROVIDER: Kathyrn Lass, MD   PT End of Session - 03/21/21 1118     Visit Number 12    Number of Visits 16    Date for PT Re-Evaluation 05/09/21    Authorization Type Aetna Medicare    PT Start Time 1115    PT Stop Time 1200    PT Time Calculation (min) 45 min    Equipment Utilized During Treatment Other (comment)    Activity Tolerance Patient tolerated treatment well    Behavior During Therapy [facility] for tasks assessed/performed              Past Medical History:  Diagnosis Date   Anxiety    panic attacks   ARF (acute renal failure) secondary to dehydration from osmotic diuresis 05/21/2013   KIDNEY FUNCTION NORMAL NOW   Arthritis    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    Cataracts, bilateral    DDD (degenerative disc disease)    DKA (diabetic ketoacidoses) 05/21/2013   TYPE 2   GERD (gastroesophageal reflux disease)    Headache    SINUS   High cholesterol    History of claustrophobia    Hypertension    Neuropathy 2015   feet   Obesity    Osteopenia    PONV (postoperative nausea and vomiting)    Past Surgical History:  Procedure Laterality Date   ABDOMINAL HYSTERECTOMY     COMPLETE   BREAST EXCISIONAL BIOPSY Left 04/20/2020   ALH   BREAST LUMPECTOMY WITH RADIOACTIVE SEED LOCALIZATION Left 01/04/2021   Procedure: LEFT BREAST LUMPECTOMY WITH RADIOACTIVE SEED LOCALIZATION X 3;  Surgeon: Jovita Kussmaul, MD;  Location: [place];  Service: General;  Laterality: Left;   BREAST SURGERY Left 04/20/2020   BX   JOINT REPLACEMENT     KNEE JOINT MANIPULATION Right    OVARIAN CYST AND OVARY REMOVED  YRS AGO   TOTAL KNEE ARTHROPLASTY Right 06/21/2017   Procedure: RIGHT TOTAL KNEE ARTHROPLASTY;  Surgeon: Dorna Leitz, MD;  [facility];  Service:  Orthopedics;  Laterality: Right;   TOTAL KNEE ARTHROPLASTY Left 07/18/2020   Procedure: TOTAL KNEE ARTHROPLASTY;  Surgeon: Gaynelle Arabian, MD;  [facility];  Service: Orthopedics;  Laterality: Left;  81min   TOTAL KNEE REVISION Right 07/22/2019   Procedure: Right knee polyethylene exchange;  Surgeon: Gaynelle Arabian, MD;  [facility];  Service: Orthopedics;  Laterality: Right;  18min   Patient Active Problem List   Diagnosis Date Noted   Atypical lobular hyperplasia [facility]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 09/13/2020   Acute diverticulitis 08/15/2020   Hypomagnesemia 08/15/2020   Hypocalcemia 08/15/2020   Type 2 diabetes mellitus with hyperlipidemia ([place]) 08/15/2020   Anemia 08/15/2020   Failed total right knee replacement ([place]) 07/22/2019   Postoperative anemia due to acute blood loss 06/24/2017   Primary osteoarthritis of right knee 06/21/2017   Primary osteoarthritis of left knee 06/21/2017   DM (diabetes mellitus), secondary, uncontrolled, with ketoacidosis ([place]) 05/23/2013   Hypokalemia 05/22/2013   Obesity (BMI 30-39.9) 05/22/2013   DKA (diabetic ketoacidoses) 05/21/2013   Muscle spasm 05/21/2013   Hyponatremia 05/21/2013   ARF (acute renal failure) secondary to dehydration from osmotic diuresis 05/21/2013   HTN (hypertension) 05/21/2013   Hyperlipidemia 05/21/2013   GERD (gastroesophageal reflux disease) 05/21/2013   Infection due to trichomonas 05/21/2013  REFERRING DIAG: R44.315 (ICD-10-CM) - Presence of left artificial knee joint   THERAPY DIAG:  Abnormal posture  Chronic pain of right knee  Difficulty in walking, not elsewhere classified  Muscle weakness (generalized)  Other abnormalities of gait and mobility  Unsteadiness on feet  PERTINENT HISTORY: PERTINENT HISTORY: R TKA in 2019 or 2020 and revision done March 2021 in R, peripheral neuropathy, DDD, HTN   PRECAUTIONS: N/A  SUBJECTIVE: "I just came from the [employer] rode the recumbent bike for a long time and  walked.  I'm tired"  PAIN:  Are you having pain? Yes VAS scale: 2/10  Pain location: L knee; R knee stiff PAIN TYPE: stiff/tight Pain description: intermittent     OBJECTIVE:   Lower Extremity Functional Score: 38 / 80 = 47.5 %  5XSTS 19.6s   GAIT: Mod. Indep with SPC, SBA with outdoor walking on turf surface and well as with curb navigation; independent walking inside clinic on level surface  TODAY'S TREATMENT:  11/15 Pt seen for aquatic therapy today.  Treatment took place in water 3.25-4 ft in depth at the [facility] pool. Temp of water was 94 deg.  Pt entered/exited the pool via stairs backward with bilat rail and assistive device (walker/rolling cart) once getting out of pool.  Gait training:  amb in pool without ue support, waist deep fwd and back, marching, sidestepping 6x ea - cuing given for lengthening stride  Standing; hip flex and extension unsupported 2x10 Squats 2 x 10 on bottom step Step ups forward and backward R/L x10. Cues for decreased UE support and weight shifting Cycling  Stair climbing 2x5 steps. Cues for initiation of left knee flex with descent.   03/15/21 Gait training with [place]; cuing needed for heel strike and toe off, proper sequencing with level ground, turf surface, sloped surface, and curb/step management- outdoor walking in courtyard area of [place] facility (gait belt used) STS 5x Supine strap AAROM knee flexion 10s 10x LAQ 20x  03/13/21 Pt seen for aquatic therapy today.  Treatment took place in water 3.25-4 ft in depth at the [facility] pool. Temp of water was 94 deg.  Pt entered/exited the pool via stairs backward with bilat rail and assistive device (walker/rolling cart) once getting out of pool.   Gait: waist deep fwd and back, marching, sidestepping 6x ea (no UE)- cuing given for lengthening stride, arm swing, heel toe gait, and reduction of stiff knee/guarding  Exercises Fwd, lateral  2x10 (cued for hip extension  and quad activation, single UE support with cuing for reduced pulling) Double UE support squat at edge of pool 2x10 Standing march single UE support 20x Seated cylcing 20x retro and fwd 2x Standing squat with yellow noodle around chest 3x10 (no holding onto pool edge, cuing for hip extension)    Pt requires buoyancy for support and to offload joints with strengthening exercises. Viscosity of the water is needed for resistance of strengthening; water current perturbations provides challenge to standing balance unsupported, requiring increased core activation.    03/08/21 Pt seen for aquatic therapy today.  Treatment took place in water 3.25-4 ft in depth at the [facility] pool. Temp of water was 92 deg.  Pt entered/exited the pool via stairs backward with bilat rail and assistive device (walker/rolling cart) once getting out of pool.   Warm up: waist deep fwd and back, marching, sidestepping 6x ea (yellow DB support bilat)  Gait training  Multiple widths of pool with HHA to increased cadence and step  length.  VC and demonstration on proper execution along with using arm swing for balance.  Pt amb without hand buoys with apprehension but gains confidence as session continues.  Backward amb to encourage weight shift and knee flex. Cues for toe/heel for proper foot strike.  Exercises Step ups backward decreasing ue support. VC and tc for weight shifting.  Side step ups hha for weight shift. Cues for proper technique/not leaning with shoulder but shifting hip over knee and ankle before rising VMO strengthening on bottom step x 10 reps.    Pt requires buoyancy for support and to offload joints with strengthening exercises. Viscosity of the water is needed for resistance of strengthening; water current perturbations provides challenge to standing balance unsupported, requiring increased core activation.    PATIENT EDUCATION:  Education details: anatomy, exercise progression, DOMS  expectations, muscle firing, relaxation, envelope of function, HEP, POC   Person educated: Patient Education method: Explanation, Demonstration, Tactile cues, Verbal cues, and Handouts Education comprehension: verbalized understanding, returned demonstration, verbal cues required, tactile cues required, and needs further education     HOME EXERCISE PROGRAM: Access Code: [email or web address] URL: [email or web address] Date: 02/08/2021 Prepared by: Daleen Bo       ASSESSMENT:   CLINICAL IMPRESSION:  Pt enters pool with instruction of no UE support. Gait submerged wfl; normal arm swing and step length. Focued on proximal le strength and closed chain exercises. Pt worked out at [employer] prior to session today and reports she is tired.  She is encouraged to come to PT session prior to going to [facility]. With cuing pt has improved eccentric control of left knee closed chain flex. Continues to be limited by fear. She does demo ability to amb on land prior to session using cane with good execution and safety.      REHAB POTENTIAL: Fair     CLINICAL DECISION MAKING: Stable/uncomplicated   EVALUATION COMPLEXITY: Low     GOALS:     SHORT TERM GOALS:   STG Name Target Date Goal status  1 Pt will become independent with HEP in order to demonstrate synthesis of PT education.   :  02/22/2021 Ongoing  2 Pt will have an at least 9 pt improvement in LEFS measure in order to demonstrate MCID improvement in daily function.   :  03/08/2021 ongoing  3 Pt will report at least 2 pt reduction on VAS scale for pain in order to demonstrate functional improvement with household activity, self care, and ADL.   : 03/08/2021 Met    LONG TERM GOALS:    LTG Name Target Date Goal status  1 Pt  will become independent with final HEP in order to demonstrate synthesis of PT education.     04/05/2021 ongoing  2 Pt will be able to demonstrate/report ability to walk >15 mins without pain in order to demonstrate  functional improvement and tolerance to exercise and community mobility.   04/05/2021 ongoing  3 Pt will be able to demonstrate/report ability to sit/stand for >25 min periods of time without pain in order to demonstrate functional improvement and tolerance to static positioning.    04/05/2021 ongoing  4 Pt will be able to demonstrate ability to walk without AD in order to demonstrate functional improvement in LE function for self-care and house hold duties.     04/05/2021 ongoing    PLAN: PT FREQUENCY: 2x/week   PT DURATION: 8 weeks   PLANNED INTERVENTIONS: Therapeutic exercises, Therapeutic activity, Neuro Muscular re-education, Balance training, Gait training, Patient/Family  education, Engineer, manufacturing, Stair training, Orthotic/Fit training, Aquatic Therapy, Dry Needling, Electrical stimulation, Spinal mobilization, Cryotherapy, Moist heat, scar mobilization, Taping, Vasopneumatic device, Traction, Ultrasound, Ionotophoresis 4mg /ml Dexamethasone, and Manual therapy   PLAN FOR NEXT SESSION:  LE strength in pool, eccentric left knee strengthening.   Stanton Kidney Tharon Aquas) Mccauley Diehl MPT 03/21/21 1:35 PM

## 2021-03-23 ENCOUNTER — Other Ambulatory Visit: Payer: Self-pay

## 2021-03-23 ENCOUNTER — Encounter (HOSPITAL_BASED_OUTPATIENT_CLINIC_OR_DEPARTMENT_OTHER): Payer: Self-pay | Admitting: Physical Therapy

## 2021-03-23 ENCOUNTER — Ambulatory Visit (HOSPITAL_BASED_OUTPATIENT_CLINIC_OR_DEPARTMENT_OTHER): Payer: Medicare HMO | Admitting: Physical Therapy

## 2021-03-23 DIAGNOSIS — R293 Abnormal posture: Secondary | ICD-10-CM | POA: Diagnosis not present

## 2021-03-23 DIAGNOSIS — R262 Difficulty in walking, not elsewhere classified: Secondary | ICD-10-CM

## 2021-03-23 DIAGNOSIS — M6281 Muscle weakness (generalized): Secondary | ICD-10-CM

## 2021-03-23 DIAGNOSIS — G8929 Other chronic pain: Secondary | ICD-10-CM

## 2021-03-23 NOTE — Therapy (Signed)
OUTPATIENT PHYSICAL THERAPY TREATMENT NOTE   Patient Name: Connie Branch MRN: [id number] DOB:January 24, 1951, 70 y.o., female Today's Date: 03/23/2021  PCP: Kathyrn Lass, MD REFERRING PROVIDER: Kathyrn Lass, MD   PT End of Session - 03/23/21 0848     Visit Number 13    Number of Visits 16    Date for PT Re-Evaluation 05/09/21    Authorization Type [employer] Medicare    PT Start Time 0930    PT Stop Time 1010    PT Time Calculation (min) 40 min    Equipment Utilized During Treatment Other (comment)    Activity Tolerance Patient tolerated treatment well    Behavior During Therapy 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    Cataracts, bilateral    DDD (degenerative disc disease)    DKA (diabetic ketoacidoses) 05/21/2013   TYPE 2   GERD (gastroesophageal reflux disease)    Headache    SINUS   High cholesterol    History of claustrophobia    Hypertension    Neuropathy 2015   feet   Obesity    Osteopenia    PONV (postoperative nausea and vomiting)    Past Surgical History:  Procedure Laterality Date   ABDOMINAL HYSTERECTOMY     COMPLETE   BREAST EXCISIONAL BIOPSY Left 04/20/2020   ALH   BREAST LUMPECTOMY WITH RADIOACTIVE SEED LOCALIZATION Left 01/04/2021   Procedure: LEFT BREAST LUMPECTOMY WITH RADIOACTIVE SEED LOCALIZATION X 3;  Surgeon: Jovita Kussmaul, MD;  Location: [place];  Service: General;  Laterality: Left;   BREAST SURGERY Left 04/20/2020   BX   JOINT REPLACEMENT     KNEE JOINT MANIPULATION Right    OVARIAN CYST AND OVARY REMOVED  YRS AGO   TOTAL KNEE ARTHROPLASTY Right 06/21/2017   Procedure: RIGHT TOTAL KNEE ARTHROPLASTY;  Surgeon: Dorna Leitz, MD;  [facility];  Service:  Orthopedics;  Laterality: Right;   TOTAL KNEE ARTHROPLASTY Left 07/18/2020   Procedure: TOTAL KNEE ARTHROPLASTY;  Surgeon: Gaynelle Arabian, MD;  Location: [facility];  Service: Orthopedics;  Laterality: Left;  56mn   TOTAL KNEE REVISION Right 07/22/2019   Procedure: Right knee polyethylene exchange;  Surgeon: AGaynelle Arabian MD;  Location[facility];  Service: Orthopedics;  Laterality: Right;  1273m   Patient Active Problem List   Diagnosis Date Noted   [facility] 09/13[facility]   Acute diverticulitis 08/15/2020   Hypomagnesemia 08/15/2020   Hypocalcemia 08/15/2020   Type 2 diabetes mellitus with hyperlipidemia (HCTaos04/03/2021   Anemia 08/15/2020   Failed total right knee replacement (HCLynd03/17/2021   Postoperative anemia due to acute blood loss 06/24/2017   Primary osteoarthritis of right knee 06/21/2017   Primary osteoarthritis of left knee 06/21/2017   DM (diabetes mellitus), secondary, uncontrolled, with ketoacidosis (HCCoffey01/17/2015   Hypokalemia 05/22/2013   Obesity (BMI 30-39.9) 05/22/2013   DKA (diabetic ketoacidoses) 05/21/2013   Muscle spasm 05/21/2013   Hyponatremia 05/21/2013   ARF (acute renal failure) secondary to dehydration from osmotic diuresis 05/21/2013   HTN (hypertension) 05/21/2013   Hyperlipidemia 05/21/2013   GERD (gastroesophageal reflux disease) 05/21/2013   Infection due to trichomonas 05/21/2013  REFERRING DIAG: T70.017 (ICD-10-CM) - Presence of left artificial knee joint   THERAPY DIAG:  Chronic pain of right knee  Difficulty in walking, not elsewhere classified  Muscle weakness (generalized)  PERTINENT HISTORY: PERTINENT HISTORY: R TKA in 2019 or 2020 and revision done March 2021 in R, peripheral neuropathy, DDD, HTN   PRECAUTIONS: N/A  SUBJECTIVE: "I just came from the [employer] rode the recumbent bike for a long time and walked.  I'm tired"  PAIN:  Are you having pain? Yes VAS scale: 2/10  Pain location: L  knee; R knee stiff PAIN TYPE: stiff/tight Pain description: intermittent     OBJECTIVE:   Last Re-assess  Lower Extremity Functional Score: 38 / 80 = 47.5 %  5XSTS 19.6s   TODAY'S TREATMENT:  11/15 Pt seen for aquatic therapy today.  Treatment took place in water 3.25-4 ft in depth at the [facility] pool. Temp of water was 94 deg.  Pt entered/exited the pool via stairs (step to pattern) with bilat rail and assistive device (walker/rolling cart) once getting out of pool.  Gait training:  amb in pool without ue support, waist deep fwd and back, marching, sidestepping 6x ea - cuing given for lengthening stride  Standing; hip flex and extension unsupported 2x10 Pool marching 4x  Squats 2 x 10 on bottom step no UE supported Step ups lateral and backward R/L 10x. Cues for square, hip extension, and level pelvis Fwd step up on bottom step without UE 2x10 each Stair climbing 2x5 steps. Cues for initiation of left knee flex with descent.   11/15 Pt seen for aquatic therapy today.  Treatment took place in water 3.25-4 ft in depth at the [facility] pool. Temp of water was 94 deg.  Pt entered/exited the pool via stairs backward with bilat rail and assistive device (walker/rolling cart) once getting out of pool.  Gait training:  amb in pool without ue support, waist deep fwd and back, marching, sidestepping 6x ea - cuing given for lengthening stride  Standing; hip flex and extension unsupported 2x10 Squats 2 x 10 on bottom step Step ups forward and backward R/L x10. Cues for decreased UE support and weight shifting Cycling  Stair climbing 2x5 steps. Cues for initiation of left knee flex with descent.   03/15/21- LAND Gait training with SPC; cuing needed for heel strike and toe off, proper sequencing with level ground, turf surface, sloped surface, and curb/step management- outdoor walking in courtyard area of [place] facility (gait belt used) STS 5x Supine strap  AAROM knee flexion 10s 10x LAQ 20x  03/13/21 Pt seen for aquatic therapy today.  Treatment took place in water 3.25-4 ft in depth at the [facility] pool. Temp of water was 94 deg.  Pt entered/exited the pool via stairs backward with bilat rail and assistive device (walker/rolling cart) once getting out of pool.   Gait: waist deep fwd and back, marching, sidestepping 6x ea (no UE)- cuing given for lengthening stride, arm swing, heel toe gait, and reduction of stiff knee/guarding  Exercises Fwd, lateral  2x10 (cued for hip extension and quad activation, single UE support with cuing for reduced pulling) Double UE support squat at edge of pool 2x10 Standing march single UE support 20x Seated cylcing 20x retro and fwd 2x Standing squat with yellow noodle around chest 3x10 (no holding onto pool edge, cuing for hip extension)    Pt requires buoyancy for support and to offload joints with strengthening exercises. Viscosity of the water is  needed for resistance of strengthening; water current perturbations provides challenge to standing balance unsupported, requiring increased core activation.      PATIENT EDUCATION:  Education details: anatomy, exercise progression, DOMS expectations, muscle firing, relaxation, envelope of function, HEP, POC   Person educated: Patient Education method: Explanation, Demonstration, Tactile cues, Verbal cues, and Handouts Education comprehension: verbalized understanding, returned demonstration, verbal cues required, tactile cues required, and needs further education     HOME EXERCISE PROGRAM: Access Code: [email or web address] URL: [email or web address] Date: 02/08/2021 Prepared by: Daleen Bo       ASSESSMENT:   CLINICAL IMPRESSION:  Pt able to progress dynamic movements and LE strength at today's session without increased pain. Pt able to tolerate more CKC exercise without UE support and improve dynamic balance. Pt appears to be gaining  confidence with unsupported movement but continues to show apprehension with unsupported walking and turbulent environments with < ~60% BW. Plan to proceed with alternating land pool visits in order to begin transition to land based exercise. Pt would benefit from continued skilled therapy in order to reach goals and maximize functional bilat knee strength and ROM for prevention of falls and functional decline.        REHAB POTENTIAL: Fair     CLINICAL DECISION MAKING: Stable/uncomplicated   EVALUATION COMPLEXITY: Low     GOALS:     SHORT TERM GOALS:   STG Name Target Date Goal status  1 Pt will become independent with HEP in order to demonstrate synthesis of PT education.   :  02/22/2021 Ongoing  2 Pt will have an at least 9 pt improvement in LEFS measure in order to demonstrate MCID improvement in daily function.   :  03/08/2021 ongoing  3 Pt will report at least 2 pt reduction on VAS scale for pain in order to demonstrate functional improvement with household activity, self care, and ADL.   : 03/08/2021 Met    LONG TERM GOALS:    LTG Name Target Date Goal status  1 Pt  will become independent with final HEP in order to demonstrate synthesis of PT education.     04/05/2021 ongoing  2 Pt will be able to demonstrate/report ability to walk >15 mins without pain in order to demonstrate functional improvement and tolerance to exercise and community mobility.   04/05/2021 ongoing  3 Pt will be able to demonstrate/report ability to sit/stand for >25 min periods of time without pain in order to demonstrate functional improvement and tolerance to static positioning.    04/05/2021 ongoing  4 Pt will be able to demonstrate ability to walk without AD in order to demonstrate functional improvement in LE function for self-care and house hold duties.     04/05/2021 ongoing    PLAN: PT FREQUENCY: 2x/week   PT DURATION: 8 weeks   PLANNED INTERVENTIONS: Therapeutic exercises, Therapeutic  activity, Neuro Muscular re-education, Balance training, Gait training, Patient/Family education, Joint mobilization, Stair training, Orthotic/Fit training, Aquatic Therapy, Dry Needling, Electrical stimulation, Spinal mobilization, Cryotherapy, Moist heat, scar mobilization, Taping, Vasopneumatic device, Traction, Ultrasound, Ionotophoresis 9m/ml Dexamethasone, and Manual therapy   PLAN FOR NEXT SESSION:  LE strength in pool, eccentric left knee strengthening.   ADaleen BoPT, DPT 03/23/21 10:06 AM

## 2021-03-27 ENCOUNTER — Ambulatory Visit (HOSPITAL_BASED_OUTPATIENT_CLINIC_OR_DEPARTMENT_OTHER): Payer: Medicare HMO | Admitting: Physical Therapy

## 2021-03-28 ENCOUNTER — Ambulatory Visit (HOSPITAL_BASED_OUTPATIENT_CLINIC_OR_DEPARTMENT_OTHER): Payer: Self-pay | Admitting: Physical Therapy

## 2021-04-03 ENCOUNTER — Encounter (HOSPITAL_BASED_OUTPATIENT_CLINIC_OR_DEPARTMENT_OTHER): Payer: Self-pay | Admitting: Physical Therapy

## 2021-04-03 ENCOUNTER — Other Ambulatory Visit: Payer: Self-pay

## 2021-04-03 ENCOUNTER — Ambulatory Visit (HOSPITAL_BASED_OUTPATIENT_CLINIC_OR_DEPARTMENT_OTHER): Payer: Medicare HMO | Admitting: Physical Therapy

## 2021-04-03 DIAGNOSIS — M25562 Pain in left knee: Secondary | ICD-10-CM

## 2021-04-03 DIAGNOSIS — M6281 Muscle weakness (generalized): Secondary | ICD-10-CM

## 2021-04-03 DIAGNOSIS — R262 Difficulty in walking, not elsewhere classified: Secondary | ICD-10-CM

## 2021-04-03 DIAGNOSIS — R293 Abnormal posture: Secondary | ICD-10-CM | POA: Diagnosis not present

## 2021-04-03 DIAGNOSIS — M25561 Pain in right knee: Secondary | ICD-10-CM

## 2021-04-03 NOTE — Therapy (Signed)
OUTPATIENT PHYSICAL THERAPY TREATMENT NOTE   Patient Name: Connie Branch MRN: [id number] DOB:November 15, 1950, 70 y.o., female Today's Date: 04/03/2021  PCP: Kathyrn Lass, MD REFERRING PROVIDER: Kathyrn Lass, MD   PT End of Session - 04/03/21 0851     Visit Number 14    Number of Visits 16    Date for PT Re-Evaluation 05/09/21    Authorization Type A[employer]na Medicare    PT Start Time 0900    PT Stop Time 0945    PT Time Calculation (min) 45 min    Equipment Utilized During Treatment Other (comment)    Activity Tolerance Patient tolerated treatment well    Behavior During Therapy [facility] for tasks assessed/performed              Past Medical History:  Diagnosis Date   Anxiety    panic attacks   ARF (acute renal failure) secondary to dehydration from osmotic diuresis 05/21/2013   KIDNEY FUNCTION NORMAL NOW   Arthritis    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    Cataracts, bilateral    DDD (degenerative disc disease)    DKA (diabetic ketoacidoses) 05/21/2013   TYPE 2   GERD (gastroesophageal reflux disease)    Headache    SINUS   High cholesterol    History of claustrophobia    Hypertension    Neuropathy 2015   feet   Obesity    Osteopenia    PONV (postoperative nausea and vomiting)    Past Surgical History:  Procedure Laterality Date   ABDOMINAL HYSTERECTOMY     COMPLETE   BREAST EXCISIONAL BIOPSY Left 04/20/2020   ALH   BREAST LUMPECTOMY WITH RADIOACTIVE SEED LOCALIZATION Left 01/04/2021   Procedure: LEFT BREAST LUMPECTOMY WITH RADIOACTIVE SEED LOCALIZATION X 3;  Surgeon: Jovita Kussmaul, MD;  Location: [place];  Service: General;  Laterality: Left;   BREAST SURGERY Left 04/20/2020   BX   JOINT REPLACEMENT     KNEE JOINT MANIPULATION Right    OVARIAN CYST AND OVARY REMOVED  YRS AGO   TOTAL KNEE ARTHROPLASTY Right 06/21/2017   Procedure: RIGHT TOTAL KNEE ARTHROPLASTY;  Surgeon: Dorna Leitz, MD;  [facility];  Service:  Orthopedics;  Laterality: Right;   TOTAL KNEE ARTHROPLASTY Left 07/18/2020   Procedure: TOTAL KNEE ARTHROPLASTY;  Surgeon: Gaynelle Arabian, MD;  [facility];  Service: Orthopedics;  Laterality: Left;  14min   TOTAL KNEE REVISION Right 07/22/2019   Procedure: Right knee polyethylene exchange;  Surgeon: Gaynelle Arabian, MD;  [facility];  Service: Orthopedics;  Laterality: Right;  125min   Patient Active Problem List   Diagnosis Date Noted   Atypical lobular hyperplasia [facility]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 09/13/2020   Acute diverticulitis 08/15/2020   Hypomagnesemia 08/15/2020   Hypocalcemia 08/15/2020   Type 2 diabetes mellitus with hyperlipidemia ([place]) 08/15/2020   Anemia 08/15/2020   Failed total right knee replacement ([place]) 07/22/2019   Postoperative anemia due to acute blood loss 06/24/2017   Primary osteoarthritis of right knee 06/21/2017   Primary osteoarthritis of left knee 06/21/2017   DM (diabetes mellitus), secondary, uncontrolled, with ketoacidosis ([place]) 05/23/2013   Hypokalemia 05/22/2013   Obesity (BMI 30-39.9) 05/22/2013   DKA (diabetic ketoacidoses) 05/21/2013   Muscle spasm 05/21/2013   Hyponatremia 05/21/2013   ARF (acute renal failure) secondary to dehydration from osmotic diuresis 05/21/2013   HTN (hypertension) 05/21/2013   Hyperlipidemia 05/21/2013   GERD (gastroesophageal reflux disease) 05/21/2013   Infection due to trichomonas 05/21/2013  REFERRING DIAG: O17.711 (ICD-10-CM) - Presence of left artificial knee joint   THERAPY DIAG:  Difficulty in walking, not elsewhere classified  Muscle weakness (generalized)  Pain in joint of left knee  Pain in joint of right knee  PERTINENT HISTORY: PERTINENT HISTORY: R TKA in 2019 or 2020 and revision done March 2021 in R, peripheral neuropathy, DDD, HTN   PRECAUTIONS: N/A  SUBJECTIVE: "My knee was stiff all weekend"  PAIN:  Are you having pain? Yes VAS scale: 2/10  Pain location: L knee; R knee stiff PAIN  TYPE: stiff/tight Pain description: intermittent     OBJECTIVE:   Last Re-assess  Lower Extremity Functional Score: 38 / 80 = 47.5 %  5XSTS 19.6s   TODAY'S TREATMENT:  11/15 Pt seen for aquatic therapy today.  Treatment took place in water 3.25-4 ft in depth at the [facility] pool. Temp of water was 94 deg.  Pt entered/exited the pool via stairs (step to pattern) with bilat rail and assistive device (walker/rolling cart) once getting out of pool.  Gait training:  amb in pool without ue support, waist deep fwd and back, marching, sidestepping 6x ea - cuing given for lengthening stride  Standing hip flex and extension unsupported 2x10 Squats 2 x 10 ea in 3 ft, on bottom step then on 2nd to bottom step  Fwd step up on bottom step without UE 2x10 each vc for weight shifting VMO strengthening R/L: SLS squat cues for full extension of knee, proper weight shift (complete on step) and decreased use of ue pulling Noodle kick downs R/L 3 x 10. VC and demonstration for proper execution.  Initiated holding to wall, then to hand buoys.  Needed cues for posture.  11/15 Pt seen for aquatic therapy today.  Treatment took place in water 3.25-4 ft in depth at the [facility] pool. Temp of water was 94 deg.  Pt entered/exited the pool via stairs backward with bilat rail and assistive device (walker/rolling cart) once getting out of pool.  Gait training:  amb in pool without ue support, waist deep fwd and back, marching, sidestepping 6x ea - cuing given for lengthening stride  Standing; hip flex and extension unsupported 2x10 Squats 2 x 10 on bottom step Step ups forward and backward R/L x10. Cues for decreased UE support and weight shifting Cycling  Stair climbing 2x5 steps. Cues for initiation of left knee flex with descent.   03/15/21- LAND Gait training with SPC; cuing needed for heel strike and toe off, proper sequencing with level ground, turf surface, sloped surface, and  curb/step management- outdoor walking in courtyard area of [place] facility (gait belt used) STS 5x Supine strap AAROM knee flexion 10s 10x LAQ 20x  03/13/21 Pt seen for aquatic therapy today.  Treatment took place in water 3.25-4 ft in depth at the [facility] pool. Temp of water was 94 deg.  Pt entered/exited the pool via stairs backward with bilat rail and assistive device (walker/rolling cart) once getting out of pool.   Gait: waist deep fwd and back, marching, sidestepping 6x ea (no UE)- cuing given for lengthening stride, arm swing, heel toe gait, and reduction of stiff knee/guarding  Exercises Fwd, lateral  2x10 (cued for hip extension and quad activation, single UE support with cuing for reduced pulling) Double UE support squat at edge of pool 2x10 Standing march single UE support 20x Seated cylcing 20x retro and fwd 2x Standing squat with yellow noodle around chest 3x10 (no holding onto pool edge,  cuing for hip extension)    Pt requires buoyancy for support and to offload joints with strengthening exercises. Viscosity of the water is needed for resistance of strengthening; water current perturbations provides challenge to standing balance unsupported, requiring increased core activation.      PATIENT EDUCATION:  Education details: anatomy, exercise progression, DOMS expectations, muscle firing, relaxation, envelope of function, HEP, POC   Person educated: Patient Education method: Explanation, Demonstration, Tactile cues, Verbal cues, and Handouts Education comprehension: verbalized understanding, returned demonstration, verbal cues required, tactile cues required, and needs further education     HOME EXERCISE PROGRAM: Access Code: [email or web address] URL: [email or web address] Date: 02/08/2021 Prepared by: Daleen Bo       ASSESSMENT:   CLINICAL IMPRESSION:  Pt with increased "stiffness"/discomfort due to active weekend. Increased guarding with movement  today. Added new exercise to move pt into higher level balance challenges in attempts to decrease fear. Minimally successful.       REHAB POTENTIAL: Fair     CLINICAL DECISION MAKING: Stable/uncomplicated   EVALUATION COMPLEXITY: Low     GOALS:     SHORT TERM GOALS:   STG Name Target Date Goal status  1 Pt will become independent with HEP in order to demonstrate synthesis of PT education.   :  02/22/2021 Ongoing  2 Pt will have an at least 9 pt improvement in LEFS measure in order to demonstrate MCID improvement in daily function.   :  03/08/2021 ongoing  3 Pt will report at least 2 pt reduction on VAS scale for pain in order to demonstrate functional improvement with household activity, self care, and ADL.   : 03/08/2021 Met    LONG TERM GOALS:    LTG Name Target Date Goal status  1 Pt  will become independent with final HEP in order to demonstrate synthesis of PT education.     04/05/2021 ongoing  2 Pt will be able to demonstrate/report ability to walk >15 mins without pain in order to demonstrate functional improvement and tolerance to exercise and community mobility.   04/05/2021 ongoing  3 Pt will be able to demonstrate/report ability to sit/stand for >25 min periods of time without pain in order to demonstrate functional improvement and tolerance to static positioning.    04/05/2021 ongoing  4 Pt will be able to demonstrate ability to walk without AD in order to demonstrate functional improvement in LE function for self-care and house hold duties.     04/05/2021 ongoing    PLAN: PT FREQUENCY: 2x/week   PT DURATION: 8 weeks   PLANNED INTERVENTIONS: Therapeutic exercises, Therapeutic activity, Neuro Muscular re-education, Balance training, Gait training, Patient/Family education, Joint mobilization, Stair training, Orthotic/Fit training, Aquatic Therapy, Dry Needling, Electrical stimulation, Spinal mobilization, Cryotherapy, Moist heat, scar mobilization, Taping,  Vasopneumatic device, Traction, Ultrasound, Ionotophoresis 4mg /ml Dexamethasone, and Manual therapy   PLAN FOR NEXT SESSION:  LE strength in pool, eccentric left knee strengthening.   Stanton Kidney Tharon Aquas) Lindie Roberson MPT 04/03/21 2:48 PM

## 2021-04-05 ENCOUNTER — Ambulatory Visit (HOSPITAL_BASED_OUTPATIENT_CLINIC_OR_DEPARTMENT_OTHER): Payer: Medicare HMO | Admitting: Physical Therapy

## 2021-04-05 ENCOUNTER — Other Ambulatory Visit: Payer: Self-pay

## 2021-04-05 ENCOUNTER — Encounter (HOSPITAL_BASED_OUTPATIENT_CLINIC_OR_DEPARTMENT_OTHER): Payer: Self-pay | Admitting: Physical Therapy

## 2021-04-05 DIAGNOSIS — M25561 Pain in right knee: Secondary | ICD-10-CM

## 2021-04-05 DIAGNOSIS — M6281 Muscle weakness (generalized): Secondary | ICD-10-CM

## 2021-04-05 DIAGNOSIS — R262 Difficulty in walking, not elsewhere classified: Secondary | ICD-10-CM

## 2021-04-05 DIAGNOSIS — R293 Abnormal posture: Secondary | ICD-10-CM | POA: Diagnosis not present

## 2021-04-05 DIAGNOSIS — M25562 Pain in left knee: Secondary | ICD-10-CM

## 2021-04-05 NOTE — Therapy (Signed)
OUTPATIENT PHYSICAL THERAPY RECERTIFICATION  Patient Name: Connie Branch MRN: [id number] DOB:1950/08/07, 70 y.o., female Today's Date: 04/05/2021  PCP: Kathyrn Lass, MD REFERRING PROVIDER: Kathyrn Lass, MD   PT End of Session - 04/05/21 [phone]     Visit Number 15    Number of Visits 25    Date for PT Re-Evaluation 07/04/21    Authorization Type [employer]    PT Start Time 0930    PT Stop Time 1010    PT Time Calculation (min) 40 min    Equipment Utilized During Treatment Other (comment)    Activity Tolerance Patient tolerated treatment well    Behavior During Therapy [facility] for tasks assessed/performed              Past Medical History:  Diagnosis Date   Anxiety    panic attacks   ARF (acute renal failure) secondary to dehydration from osmotic diuresis 05/21/2013   KIDNEY FUNCTION NORMAL NOW   Arthritis    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    Cataracts, bilateral    DDD (degenerative disc disease)    DKA (diabetic ketoacidoses) 05/21/2013   TYPE 2   GERD (gastroesophageal reflux disease)    Headache    SINUS   High cholesterol    History of claustrophobia    Hypertension    Neuropathy 2015   feet   Obesity    Osteopenia    PONV (postoperative nausea and vomiting)    Past Surgical History:  Procedure Laterality Date   ABDOMINAL HYSTERECTOMY     COMPLETE   BREAST EXCISIONAL BIOPSY Left 04/20/2020   ALH   BREAST LUMPECTOMY WITH RADIOACTIVE SEED LOCALIZATION Left 01/04/2021   Procedure: LEFT BREAST LUMPECTOMY WITH RADIOACTIVE SEED LOCALIZATION X 3;  Surgeon: Jovita Kussmaul, MD;  Location: [place];  Service: General;  Laterality: Left;   BREAST SURGERY Left 04/20/2020   BX   JOINT REPLACEMENT     KNEE JOINT MANIPULATION Right    OVARIAN CYST AND OVARY REMOVED  YRS AGO   TOTAL KNEE ARTHROPLASTY Right 06/21/2017   Procedure: RIGHT TOTAL KNEE ARTHROPLASTY;  Surgeon: Dorna Leitz, MD;  [facility];  Service:  Orthopedics;  Laterality: Right;   TOTAL KNEE ARTHROPLASTY Left 07/18/2020   Procedure: TOTAL KNEE ARTHROPLASTY;  Surgeon: Gaynelle Arabian, MD;  Location[facility];  Service: Orthopedics;  Laterality: Left;  70mn   TOTAL KNEE REVISION Right 07/22/2019   Procedure: Right knee polyethylene exchange;  Surgeon: AGaynelle Arabian MD;  Location[facility];  Service: Orthopedics;  Laterality: Right;  1260m   Patient Active Problem List   Diagnosis Date Noted   [facility] breast 09/13/2020   Acute diverticulitis 08/15/2020   Hypomagnesemia 08/15/2020   Hypocalcemia 08/15/2020   Type 2 diabetes mellitus with hyperlipidemia ([facility]CO[place]04/03/2021   Anemia 08/15/2020   Failed total right knee replacement ([facility]03/17/2021   Postoperative anemia due to acute blood loss 06/24/2017   Primary osteoarthritis of right knee 06/21/2017   Primary osteoarthritis of left knee 06/21/2017   DM (diabetes mellitus), secondary, uncontrolled, with ketoacidosis ([facility] [place]01/17/2015   Hypokalemia 05/22/2013   Obesity (BMI 30-39.9) 05/22/2013   DKA (diabetic ketoacidoses) 05/21/2013   Muscle spasm 05/21/2013   Hyponatremia 05/21/2013   ARF (acute renal failure) secondary to dehydration from osmotic diuresis 05/21/2013   HTN (hypertension) 05/21/2013   Hyperlipidemia 05/21/2013   GERD (gastroesophageal reflux disease) 05/21/2013   Infection due to trichomonas 05/21/2013  REFERRING DIAG: A76.811 (ICD-10-CM) - Presence of left artificial knee joint   REFERRING PROVIDER: Hector Shade, MD  THERAPY DIAG:  Difficulty in walking, not elsewhere classified - Plan: PT plan of care cert/re-cert  Muscle weakness (generalized) - Plan: PT plan of care cert/re-cert  Pain in joint of left knee - Plan: PT plan of care cert/re-cert  Pain in joint of right knee - Plan: PT plan of care cert/re-cert  PERTINENT HISTORY: PERTINENT HISTORY: R TKA in 2019 or 2020 and revision done March 2021 in R, peripheral  neuropathy, DDD, HTN   PRECAUTIONS: N/A  SUBJECTIVE: My knees have been stiff. [facility] and then therapy here.   PAIN:  Are you having pain? Yes VAS scale: 2/10  Pain location: L knee; R knee stiff PAIN TYPE: stiff/tight Pain description: intermittent     OBJECTIVE:   SLS: <10s bilat  LEFS 36/80 points (or 45.00%)  5XSTS 18.3s  Gait: able to ambulate level surface with SPC, Mod I  Knee ROM with WFL for tasks assessed. Still limited at end range flexion due to apprehension and reported stiffness with flexion. Able to reach TKE bilaterally  TODAY'S TREATMENT:  04/05/21- LAND  Gait training with SPC; cuing needed for heel strike and toe off, proper sequencing with level ground, obstacle management  pliant surface(gait belt used)  Leg Press: shuttle staggered stance 75lbs 3x10 Knee ext 3x10 15lbs  11/15 Pt seen for aquatic therapy today.  Treatment took place in water 3.25-4 ft in depth at the [facility] pool. Temp of water was 94 deg.  Pt entered/exited the pool via stairs (step to pattern) with bilat rail and assistive device (walker/rolling cart) once getting out of pool.  Gait training:  amb in pool without ue support, waist deep fwd and back, marching, sidestepping 6x ea - cuing given for lengthening stride  Standing hip flex and extension unsupported 2x10 Squats 2 x 10 ea in 3 ft, on bottom step then on 2nd to bottom step  Fwd step up on bottom step without UE 2x10 each vc for weight shifting VMO strengthening R/L: SLS squat cues for full extension of knee, proper weight shift (complete on step) and decreased use of ue pulling Noodle kick downs R/L 3 x 10. VC and demonstration for proper execution.  Initiated holding to wall, then to hand buoys.  Needed cues for posture.  Pt requires buoyancy for support and to offload joints with strengthening exercises. Viscosity of the water is needed for resistance of strengthening; water  current perturbations provides challenge to standing balance unsupported, requiring increased core activation.      PATIENT EDUCATION:  Education details: anatomy, exercise progression, DOMS expectations, muscle firing, relaxation, envelope of function, HEP, POC   Person educated: Patient Education method: Explanation, Demonstration, Tactile cues, Verbal cues, and Handouts Education comprehension: verbalized understanding, returned demonstration, verbal cues required, tactile cues required, and needs further education     HOME EXERCISE PROGRAM: Access Code: [email or web address] URL: [email or web address] Date: 02/08/2021 Prepared by: Daleen Bo       ASSESSMENT:   CLINICAL IMPRESSION:  Pt does demonstrate improvement with gait with use of SPC rather than RW but continues to have apprehension and fear limiting her functional mobility. On indoor, level surfaces,  pt is now able to ambulate with AD. Pt requires cuing and reinforcement for use of bilat knees during functional mobility and gait due to knee stiffness and guarding. Pt to alternate visits from land  to aquatic in ordure to promote better land strength and mobility. Overall, pt has made progress with functional mobility but continues to have knee strength and ROM deficits that limit her ADL and report of performing household duties per LEFS. HEP updated with gym based machines for pt to perform when outside of PT. Pt would benefit from continued skilled therapy in order to reach goals and maximize functional bilat LE strength and ROM for prevention of functional decline.       REHAB POTENTIAL: Fair     CLINICAL DECISION MAKING: Stable/uncomplicated   EVALUATION COMPLEXITY: Low     GOALS:     SHORT TERM GOALS:   STG Name Target Date Goal status  1 Pt will become independent with HEP in order to demonstrate synthesis of PT education.   :  02/22/2021 Ongoing  2 Pt will have an at least 9 pt improvement in LEFS  measure in order to demonstrate MCID improvement in daily function.   :  03/08/2021 ongoing  3 Pt will report at least 2 pt reduction on VAS scale for pain in order to demonstrate functional improvement with household activity, self care, and ADL.   : 03/08/2021 Met    LONG TERM GOALS:    LTG Name Target Date Goal status  1 Pt  will become independent with final HEP in order to demonstrate synthesis of PT education.     04/05/2021 ongoing  2 Pt will be able to demonstrate/report ability to walk >15 mins without pain in order to demonstrate functional improvement and tolerance to exercise and community mobility.   04/05/2021 ongoing  3 Pt will be able to demonstrate/report ability to sit/stand for >25 min periods of time without pain in order to demonstrate functional improvement and tolerance to static positioning.    04/05/2021 ongoing  4 Pt will be able to demonstrate ability to walk without AD in order to demonstrate functional improvement in LE function for self-care and house hold duties.     04/05/2021 ongoing    PLAN: PT FREQUENCY: 2x/week   PT DURATION: 5 weeks   PLANNED INTERVENTIONS: Therapeutic exercises, Therapeutic activity, Neuro Muscular re-education, Balance training, Gait training, Patient/Family education, Joint mobilization, Stair training, Orthotic/Fit training, Aquatic Therapy, Dry Needling, Electrical stimulation, Spinal mobilization, Cryotherapy, Moist heat, scar mobilization, Taping, Vasopneumatic device, Traction, Ultrasound, Ionotophoresis 32m/ml Dexamethasone, and Manual therapy   PLAN FOR NEXT SESSION:  alternate land and pool, pool for dynamic balance and stability, land for gait, ROM, strength   ADaleen BoPT, DPT 04/05/21 1:03 PM

## 2021-04-06 ENCOUNTER — Ambulatory Visit (HOSPITAL_BASED_OUTPATIENT_CLINIC_OR_DEPARTMENT_OTHER): Payer: Medicare HMO | Admitting: Physical Therapy

## 2021-04-10 ENCOUNTER — Other Ambulatory Visit: Payer: Self-pay

## 2021-04-10 ENCOUNTER — Ambulatory Visit (HOSPITAL_BASED_OUTPATIENT_CLINIC_OR_DEPARTMENT_OTHER): Payer: Medicare HMO | Attending: Orthopedic Surgery | Admitting: Physical Therapy

## 2021-04-10 ENCOUNTER — Encounter (HOSPITAL_BASED_OUTPATIENT_CLINIC_OR_DEPARTMENT_OTHER): Payer: Self-pay | Admitting: Physical Therapy

## 2021-04-10 DIAGNOSIS — M25562 Pain in left knee: Secondary | ICD-10-CM | POA: Diagnosis present

## 2021-04-10 DIAGNOSIS — M25561 Pain in right knee: Secondary | ICD-10-CM | POA: Insufficient documentation

## 2021-04-10 DIAGNOSIS — M6281 Muscle weakness (generalized): Secondary | ICD-10-CM | POA: Insufficient documentation

## 2021-04-10 DIAGNOSIS — R262 Difficulty in walking, not elsewhere classified: Secondary | ICD-10-CM | POA: Diagnosis not present

## 2021-04-10 NOTE — Therapy (Signed)
OUTPATIENT PHYSICAL THERAPY RECERTIFICATION  Patient Name: Connie Branch MRN: [id number] DOB:1951/03/04, 70 y.o., female Today's Date: 04/10/2021  PCP: Kathyrn Lass, MD REFERRING PROVIDER: Kathyrn Lass, MD   PT End of Session - 04/10/21 1004     Visit Number 16    Number of Visits 25    Date for PT Re-Evaluation 07/04/21    Authorization Type Aetna Medicare    PT Start Time [id number]    PT Stop Time 1035    PT Time Calculation (min) 44 min    Equipment Utilized During Treatment Other (comment)    Activity Tolerance Patient tolerated treatment well    Behavior During Therapy [facility] for tasks assessed/performed              Past Medical History:  Diagnosis Date   Anxiety    panic attacks   ARF (acute renal failure) secondary to dehydration from osmotic diuresis 05/21/2013   KIDNEY FUNCTION NORMAL NOW   Arthritis    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    Cataracts, bilateral    DDD (degenerative disc disease)    DKA (diabetic ketoacidoses) 05/21/2013   TYPE 2   GERD (gastroesophageal reflux disease)    Headache    SINUS   High cholesterol    History of claustrophobia    Hypertension    Neuropathy 2015   feet   Obesity    Osteopenia    PONV (postoperative nausea and vomiting)    Past Surgical History:  Procedure Laterality Date   ABDOMINAL HYSTERECTOMY     COMPLETE   BREAST EXCISIONAL BIOPSY Left 04/20/2020   ALH   BREAST LUMPECTOMY WITH RADIOACTIVE SEED LOCALIZATION Left 01/04/2021   Procedure: LEFT BREAST LUMPECTOMY WITH RADIOACTIVE SEED LOCALIZATION X 3;  Surgeon: Jovita Kussmaul, MD;  Location: [place];  Service: General;  Laterality: Left;   BREAST SURGERY Left 04/20/2020   BX   JOINT REPLACEMENT     KNEE JOINT MANIPULATION Right    OVARIAN CYST AND OVARY REMOVED  YRS AGO   TOTAL KNEE ARTHROPLASTY Right 06/21/2017   Procedure: RIGHT TOTAL KNEE ARTHROPLASTY;  Surgeon: Dorna Leitz, MD;  [facility];  Service:  Orthopedics;  Laterality: Right;   TOTAL KNEE ARTHROPLASTY Left 07/18/2020   Procedure: TOTAL KNEE ARTHROPLASTY;  Surgeon: Gaynelle Arabian, MD;  [facility];  Service: Orthopedics;  Laterality: Left;  58min   TOTAL KNEE REVISION Right 07/22/2019   Procedure: Right knee polyethylene exchange;  Surgeon: Gaynelle Arabian, MD;  [facility];  Service: Orthopedics;  Laterality: Right;  158min   Patient Active Problem List   Diagnosis Date Noted   Atypical lobular hyperplasia [facility]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 09/13/2020   Acute diverticulitis 08/15/2020   Hypomagnesemia 08/15/2020   Hypocalcemia 08/15/2020   Type 2 diabetes mellitus with hyperlipidemia ([place]) 08/15/2020   Anemia 08/15/2020   Failed total right knee replacement ([place]) 07/22/2019   Postoperative anemia due to acute blood loss 06/24/2017   Primary osteoarthritis of right knee 06/21/2017   Primary osteoarthritis of left knee 06/21/2017   DM (diabetes mellitus), secondary, uncontrolled, with ketoacidosis ([place]) 05/23/2013   Hypokalemia 05/22/2013   Obesity (BMI 30-39.9) 05/22/2013   DKA (diabetic ketoacidoses) 05/21/2013   Muscle spasm 05/21/2013   Hyponatremia 05/21/2013   ARF (acute renal failure) secondary to dehydration from osmotic diuresis 05/21/2013   HTN (hypertension) 05/21/2013   Hyperlipidemia 05/21/2013   GERD (gastroesophageal reflux disease) 05/21/2013   Infection due to trichomonas 05/21/2013  REFERRING DIAG: I68.032 (ICD-10-CM) - Presence of left artificial knee joint   REFERRING PROVIDER: Hector Shade, MD  THERAPY DIAG:  Difficulty in walking, not elsewhere classified  Muscle weakness (generalized)  Pain in joint of left knee  Pain in joint of right knee   PERTINENT HISTORY: PERTINENT HISTORY: R TKA in 2019 or 2020 and revision done March 2021 in R, peripheral neuropathy, DDD, HTN   PRECAUTIONS: N/A  SUBJECTIVE: My knees have been stiff.   PAIN:  Are you having pain? Yes VAS scale: 2/10  Pain  location: L knee; R knee stiff PAIN TYPE: stiff/tight Pain description: intermittent     OBJECTIVE:   SLS: <10s bilat  LEFS 36/80 points (or 45.00%)  5XSTS 18.3s  Gait: able to ambulate level surface with SPC, Mod I  Knee ROM with WFL for tasks assessed. Still limited at end range flexion due to apprehension and reported stiffness with flexion. Able to reach TKE bilaterally  TODAY'S TREATMENT:  04/10/21-POOL  Pt seen for aquatic therapy today.  Treatment took place in water 3.25-4 ft in depth at the [facility] pool. Temp of water was 94 deg.  Pt entered/exited the pool via stairs (step to pattern) with bilat rail and assistive device (walker/rolling cart) once getting out of pool.  Seated Kicking at hip 2 x 30 reps Cycling forward and retro 2 x 30 revolutions.  Gait training:  amb in pool without ue support, waist deep fwd and back, marching, sidestepping 6x ea - cuing given for lengthening stride  Standing Long leg hip flex; extension; add/abd supported by hand buoys 2x10 Squats 2 x 10 bottom step Fwd step up on bottom step without UE 2x10 each vc for weight shifting VMO strengthening R/L: SLS squat cues for full extension of knee x15 Walking with buoy submerged to hips x 6 widths.  With vc for encouragement for core engagement  Pt requires buoyancy for support and to offload joints with strengthening exercises. Viscosity of the water is needed for resistance of strengthening; water current perturbations provides challenge to standing balance unsupported, requiring increased core activation.   04/05/21- LAND  Gait training with SPC; cuing needed for heel strike and toe off, proper sequencing with level ground, obstacle management  pliant surface(gait belt used)  Leg Press: shuttle staggered stance 75lbs 3x10 Knee ext 3x10 15lbs      PATIENT EDUCATION:  Education details: anatomy, exercise progression, DOMS expectations, muscle firing, relaxation, envelope  of function, HEP, POC   Person educated: Patient Education method: Explanation, Demonstration, Tactile cues, Verbal cues, and Handouts Education comprehension: verbalized understanding, returned demonstration, verbal cues required, tactile cues required, and needs further education     HOME EXERCISE PROGRAM: Access Code: [email or web address] URL: [email or web address] Date: 02/08/2021 Prepared by: Daleen Bo       ASSESSMENT:   CLINICAL IMPRESSION: Pt continues to be limited to fear.  Needed convincing to complete proximal strengthening submerged slightly deeper. States she went about 3 days without walking much because her toenail broke off low and created discomfort. Gait is more guarded today.  She does report using the exercise machines at the [facility] last visit as instructed by land therapist but complains due to having to wait for them (other people using).  She will benefit from 1/1 land/aquatics.  Aquatics to focus on strengthening, difficult to work on balance due to pt apprehensiveness to try new positions/activities.    Pt does demonstrate improvement with gait with use of SPC rather than RW but continues to  have apprehension and fear limiting her functional mobility. On indoor, level surfaces,  pt is now able to ambulate with AD. Pt requires cuing and reinforcement for use of bilat knees during functional mobility and gait due to knee stiffness and guarding. Pt to alternate visits from land to aquatic in ordure to promote better land strength and mobility. Overall, pt has made progress with functional mobility but continues to have knee strength and ROM deficits that limit her ADL and report of performing household duties per LEFS. HEP updated with gym based machines for pt to perform when outside of PT. Pt would benefit from continued skilled therapy in order to reach goals and maximize functional bilat LE strength and ROM for prevention of functional decline.       REHAB  POTENTIAL: Fair     CLINICAL DECISION MAKING: Stable/uncomplicated   EVALUATION COMPLEXITY: Low     GOALS:     SHORT TERM GOALS:   STG Name Target Date Goal status  1 Pt will become independent with HEP in order to demonstrate synthesis of PT education.   :  02/22/2021 Ongoing  2 Pt will have an at least 9 pt improvement in LEFS measure in order to demonstrate MCID improvement in daily function.   :  03/08/2021 ongoing  3 Pt will report at least 2 pt reduction on VAS scale for pain in order to demonstrate functional improvement with household activity, self care, and ADL.   : 03/08/2021 Met    LONG TERM GOALS:    LTG Name Target Date Goal status  1 Pt  will become independent with final HEP in order to demonstrate synthesis of PT education.     04/05/2021 ongoing  2 Pt will be able to demonstrate/report ability to walk >15 mins without pain in order to demonstrate functional improvement and tolerance to exercise and community mobility.   04/05/2021 ongoing  3 Pt will be able to demonstrate/report ability to sit/stand for >25 min periods of time without pain in order to demonstrate functional improvement and tolerance to static positioning.    04/05/2021 ongoing  4 Pt will be able to demonstrate ability to walk without AD in order to demonstrate functional improvement in LE function for self-care and house hold duties.     04/05/2021 ongoing    PLAN: PT FREQUENCY: 2x/week   PT DURATION: 5 weeks   PLANNED INTERVENTIONS: Therapeutic exercises, Therapeutic activity, Neuro Muscular re-education, Balance training, Gait training, Patient/Family education, Joint mobilization, Stair training, Orthotic/Fit training, Aquatic Therapy, Dry Needling, Electrical stimulation, Spinal mobilization, Cryotherapy, Moist heat, scar mobilization, Taping, Vasopneumatic device, Traction, Ultrasound, Ionotophoresis 4mg /ml Dexamethasone, and Manual therapy   PLAN FOR NEXT SESSION:  alternate land and  pool, pool for dynamic balance and stability, land for gait, ROM, strength   Stanton Kidney ([place]) Livy Ross MPT 04/10/21 2:17 PM

## 2021-04-12 ENCOUNTER — Other Ambulatory Visit: Payer: Self-pay

## 2021-04-12 ENCOUNTER — Ambulatory Visit (HOSPITAL_BASED_OUTPATIENT_CLINIC_OR_DEPARTMENT_OTHER): Payer: Medicare HMO | Admitting: Physical Therapy

## 2021-04-12 ENCOUNTER — Encounter (HOSPITAL_BASED_OUTPATIENT_CLINIC_OR_DEPARTMENT_OTHER): Payer: Self-pay | Admitting: Physical Therapy

## 2021-04-12 DIAGNOSIS — M6281 Muscle weakness (generalized): Secondary | ICD-10-CM

## 2021-04-12 DIAGNOSIS — R262 Difficulty in walking, not elsewhere classified: Secondary | ICD-10-CM | POA: Diagnosis not present

## 2021-04-12 DIAGNOSIS — M25561 Pain in right knee: Secondary | ICD-10-CM

## 2021-04-12 DIAGNOSIS — M25562 Pain in left knee: Secondary | ICD-10-CM

## 2021-04-12 NOTE — Therapy (Signed)
OUTPATIENT PHYSICAL THERAPY RECERTIFICATION  Patient Name: Connie Branch MRN: [id number] DOB:[birth year], 70 y.o., female Today's Date: 04/12/2021  PCP: Kathyrn Lass, MD REFERRING PROVIDER: Kathyrn Lass, MD   PT End of Session - 04/12/21 1131     Visit Number 17    Number of Visits 25    Date for PT Re-Evaluation 07/04/21    Authorization Type [employer]na Medicare    PT Start Time 1116    PT Stop Time 1200    PT Time Calculation (min) 44 min    Equipment Utilized During Treatment Other (comment)    Activity Tolerance Patient tolerated treatment well    Behavior During Therapy [facility] for tasks assessed/performed              Past Medical History:  Diagnosis Date   Anxiety    panic attacks   ARF (acute renal failure) secondary to dehydration from osmotic diuresis 05/21/2013   KIDNEY FUNCTION NORMAL NOW   Arthritis    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    Cataracts, bilateral    DDD (degenerative disc disease)    DKA (diabetic ketoacidoses) 05/21/2013   TYPE 2   GERD (gastroesophageal reflux disease)    Headache    SINUS   High cholesterol    History of claustrophobia    Hypertension    Neuropathy 2015   feet   Obesity    Osteopenia    PONV (postoperative nausea and vomiting)    Past Surgical History:  Procedure Laterality Date   ABDOMINAL HYSTERECTOMY     COMPLETE   BREAST EXCISIONAL BIOPSY Left 04/20/2020   ALH   BREAST LUMPECTOMY WITH RADIOACTIVE SEED LOCALIZATION Left 01/04/2021   Procedure: LEFT BREAST LUMPECTOMY WITH RADIOACTIVE SEED LOCALIZATION X 3;  Surgeon: Jovita Kussmaul, MD;  Location: [place];  Service: General;  Laterality: Left;   BREAST SURGERY Left 04/20/2020   BX   JOINT REPLACEMENT     KNEE JOINT MANIPULATION Right    OVARIAN CYST AND OVARY REMOVED  YRS AGO   TOTAL KNEE ARTHROPLASTY Right 06/21/2017   Procedure: RIGHT TOTAL KNEE ARTHROPLASTY;  Surgeon: Dorna Leitz, MD;  Location[facility];  Service:  Orthopedics;  Laterality: Right;   TOTAL KNEE ARTHROPLASTY Left 07/18/2020   Procedure: TOTAL KNEE ARTHROPLASTY;  Surgeon: Gaynelle Arabian, MD;  Location: [facility];  Service: Orthopedics;  Laterality: Left;  92min   TOTAL KNEE REVISION Right 07/22/2019   Procedure: Right knee polyethylene exchange;  Surgeon: Gaynelle Arabian, MD;  Location: [facility];  Service: Orthopedics;  Laterality: Right;  168min   Patient Active Problem List   Diagnosis Date Noted   Atypical lobular hyperplasia [facility]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 09/13/2020   Acute diverticulitis 08/15/2020   Hypomagnesemia 08/15/2020   Hypocalcemia 08/15/2020   Type 2 diabetes mellitus with hyperlipidemia ([place]) 08/15/2020   Anemia 08/15/2020   Failed total right knee replacement ([place]) 07/22/2019   Postoperative anemia due to acute blood loss 06/24/2017   Primary osteoarthritis of right knee 06/21/2017   Primary osteoarthritis of left knee 06/21/2017   DM (diabetes mellitus), secondary, uncontrolled, with ketoacidosis ([place]) 05/23/2013   Hypokalemia 05/22/2013   Obesity (BMI 30-39.9) 05/22/2013   DKA (diabetic ketoacidoses) 05/21/2013   Muscle spasm 05/21/2013   Hyponatremia 05/21/2013   ARF (acute renal failure) secondary to dehydration from osmotic diuresis 05/21/2013   HTN (hypertension) 05/21/2013   Hyperlipidemia 05/21/2013   GERD (gastroesophageal reflux disease) 05/21/2013   Infection due to trichomonas 05/21/2013  REFERRING DIAG: N46.270 (ICD-10-CM) - Presence of left artificial knee joint   REFERRING PROVIDER: Hector Shade, MD  THERAPY DIAG:  Difficulty in walking, not elsewhere classified  Muscle weakness (generalized)  Pain in joint of left knee  Pain in joint of right knee   PERTINENT HISTORY: PERTINENT HISTORY: R TKA in 2019 or 2020 and revision done March 2021 in R, peripheral neuropathy, DDD, HTN   PRECAUTIONS: N/A  SUBJECTIVE: "My blood sugar feels low, may need a candy, almost didn't come today"  PAIN:  Are you  having pain? Yes VAS scale: 2/10  Pain location: L knee; R knee stiff PAIN TYPE: stiff/tight Pain description: intermittent     OBJECTIVE:   SLS: <10s bilat  LEFS 36/80 points (or 45.00%)  5XSTS 18.3s  Gait: able to ambulate level surface with SPC, Mod I  Knee ROM with WFL for tasks assessed. Still limited at end range flexion due to apprehension and reported stiffness with flexion. Able to reach TKE bilaterally  TODAY'S TREATMENT:  04/10/21-POOL  Pt seen for aquatic therapy today.  Treatment took place in water 3.25-4 ft in depth at the [facility] pool. Temp of water was 94 deg.  Pt entered/exited the pool via stairs (step to pattern) with bilat rail and assistive device (walker/rolling cart) once getting out of pool.  Seated Kicking at hip 2 x 30 reps Cycling forward and retro 2 x 30 revolutions.  Standing hip flex; extension; add/abd; and hip circles supported by pool wall  2x12 Squats 2 x 10 pool bottom Walking with buoy submerged to hips x 6 widths.  With vc for encouragement for core engagement  Pt requires buoyancy for support and to offload joints with strengthening exercises. Viscosity of the water is needed for resistance of strengthening; water current perturbations provides challenge to standing balance unsupported, requiring increased core activation.       PATIENT EDUCATION:  Education details: anatomy, exercise progression, DOMS expectations, muscle firing, relaxation, envelope of function, HEP, POC   Person educated: Patient Education method: Explanation, Demonstration, Tactile cues, Verbal cues, and Handouts Education comprehension: verbalized understanding, returned demonstration, verbal cues required, tactile cues required, and needs further education     HOME EXERCISE PROGRAM: Access Code: [email or web address] added aquatics 12/7  URL: [email or web address] Date: 02/08/2021 Prepared by: Daleen Bo       ASSESSMENT:   CLINICAL  IMPRESSION: Modified session today due to lower BS as per pt. Was cautious not to over exert. She will bring her monitor with her next time to be able to measure when feeling shaky. Pt ate 2 mints throughout and reports feeling better. Prior to warming up. She tolerate exercises fair. Added aquatics to HEP.  Did not laminate as it may change. Pt completed exercises we do here at therapy at [facility] multiple times weekly.  She reports not liking the new on land HEP as she feels they are difficult. She is encouraged to complete as instructed.            REHAB POTENTIAL: Fair     CLINICAL DECISION MAKING: Stable/uncomplicated   EVALUATION COMPLEXITY: Low     GOALS:     SHORT TERM GOALS:   STG Name Target Date Goal status  1 Pt will become independent with HEP in order to demonstrate synthesis of PT education.   :  02/22/2021 Ongoing  2 Pt will have an at least 9 pt improvement in LEFS measure in order to demonstrate MCID improvement in daily function.   :  03/08/2021 ongoing  3 Pt will report at least 2 pt reduction on VAS scale for pain in order to demonstrate functional improvement with household activity, self care, and ADL.   : 03/08/2021 Met    LONG TERM GOALS:    LTG Name Target Date Goal status  1 Pt  will become independent with final HEP in order to demonstrate synthesis of PT education.     04/05/2021 ongoing  2 Pt will be able to demonstrate/report ability to walk >15 mins without pain in order to demonstrate functional improvement and tolerance to exercise and community mobility.   04/05/2021 ongoing  3 Pt will be able to demonstrate/report ability to sit/stand for >25 min periods of time without pain in order to demonstrate functional improvement and tolerance to static positioning.    04/05/2021 ongoing  4 Pt will be able to demonstrate ability to walk without AD in order to demonstrate functional improvement in LE function for self-care and house hold duties.      04/05/2021 ongoing    PLAN: PT FREQUENCY: 2x/week   PT DURATION: 5 weeks   PLANNED INTERVENTIONS: Therapeutic exercises, Therapeutic activity, Neuro Muscular re-education, Balance training, Gait training, Patient/Family education, Joint mobilization, Stair training, Orthotic/Fit training, Aquatic Therapy, Dry Needling, Electrical stimulation, Spinal mobilization, Cryotherapy, Moist heat, scar mobilization, Taping, Vasopneumatic device, Traction, Ultrasound, Ionotophoresis 4mg /ml Dexamethasone, and Manual therapy   PLAN FOR NEXT SESSION:  alternate land and pool, pool for dynamic balance and stability, land for gait, ROM, strength   Birdell Frasier (Frankie) Sande Pickert MPT 04/12/21 1:50 PM

## 2021-04-19 ENCOUNTER — Other Ambulatory Visit: Payer: Self-pay

## 2021-04-19 ENCOUNTER — Ambulatory Visit (HOSPITAL_BASED_OUTPATIENT_CLINIC_OR_DEPARTMENT_OTHER): Payer: Medicare HMO | Admitting: Physical Therapy

## 2021-04-19 ENCOUNTER — Encounter (HOSPITAL_BASED_OUTPATIENT_CLINIC_OR_DEPARTMENT_OTHER): Payer: Self-pay | Admitting: Physical Therapy

## 2021-04-19 DIAGNOSIS — M25562 Pain in left knee: Secondary | ICD-10-CM

## 2021-04-19 DIAGNOSIS — M6281 Muscle weakness (generalized): Secondary | ICD-10-CM

## 2021-04-19 DIAGNOSIS — M25561 Pain in right knee: Secondary | ICD-10-CM

## 2021-04-19 DIAGNOSIS — R262 Difficulty in walking, not elsewhere classified: Secondary | ICD-10-CM

## 2021-04-19 NOTE — Therapy (Signed)
OUTPATIENT PHYSICAL THERAPY TREATMENT NOTE  Patient Name: Connie Branch MRN: [id number] DOB:07-13-1950, 70 y.o., female Today's Date: 04/19/2021  PCP: Kathyrn Lass, MD REFERRING PROVIDER: Kathyrn Lass, MD   PT End of Session - 04/19/21 [phone]     Visit Number 18    Number of Visits 25    Date for PT Re-Evaluation 07/04/21    Authorization Type Aetna Medicare    PT Start Time 0930    PT Stop Time 1010    PT Time Calculation (min) 40 min    Equipment Utilized During Treatment Other (comment)    Activity Tolerance Patient tolerated treatment well    Behavior During Therapy [facility] for tasks assessed/performed               Past Medical History:  Diagnosis Date   Anxiety    panic attacks   ARF (acute renal failure) secondary to dehydration from osmotic diuresis 05/21/2013   KIDNEY FUNCTION NORMAL NOW   Arthritis    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    Cataracts, bilateral    DDD (degenerative disc disease)    DKA (diabetic ketoacidoses) 05/21/2013   TYPE 2   GERD (gastroesophageal reflux disease)    Headache    SINUS   High cholesterol    History of claustrophobia    Hypertension    Neuropathy 2015   feet   Obesity    Osteopenia    PONV (postoperative nausea and vomiting)    Past Surgical History:  Procedure Laterality Date   ABDOMINAL HYSTERECTOMY     COMPLETE   BREAST EXCISIONAL BIOPSY Left 04/20/2020   ALH   BREAST LUMPECTOMY WITH RADIOACTIVE SEED LOCALIZATION Left 01/04/2021   Procedure: LEFT BREAST LUMPECTOMY WITH RADIOACTIVE SEED LOCALIZATION X 3;  Surgeon: Jovita Kussmaul, MD;  Location: [place];  Service: General;  Laterality: Left;   BREAST SURGERY Left 04/20/2020   BX   JOINT REPLACEMENT     KNEE JOINT MANIPULATION Right    OVARIAN CYST AND OVARY REMOVED  YRS AGO   TOTAL KNEE ARTHROPLASTY Right 06/21/2017   Procedure: RIGHT TOTAL KNEE ARTHROPLASTY;  Surgeon: Dorna Leitz, MD;  [facility];  Service:  Orthopedics;  Laterality: Right;   TOTAL KNEE ARTHROPLASTY Left 07/18/2020   Procedure: TOTAL KNEE ARTHROPLASTY;  Surgeon: Gaynelle Arabian, MD;  [facility];  Service: Orthopedics;  Laterality: Left;  42mn   TOTAL KNEE REVISION Right 07/22/2019   Procedure: Right knee polyethylene exchange;  Surgeon: AGaynelle Arabian MD;  [facility];  Service: Orthopedics;  Laterality: Right;  1267m   Patient Active Problem List   Diagnosis Date Noted   [facility]   Acute diverticulitis 08/15/2020   Hypomagnesemia 08/15/2020   Hypocalcemia 08/15/2020   Type 2 diabetes mellitus with hyperlipidemia ([place]04/03/2021   Anemia 08/15/2020   Failed total right knee replacement ([place]/17/2021   Postoperative anemia due to acute blood loss 06/24/2017   Primary osteoarthritis of right knee 06/21/2017   Primary osteoarthritis of left knee 06/21/2017   DM (diabetes mellitus), secondary, uncontrolled, with ketoacidosis ([place]01/17/2015   Hypokalemia 05/22/2013   Obesity (BMI 30-39.9) 05/22/2013   DKA (diabetic ketoacidoses) 05/21/2013   Muscle spasm 05/21/2013   Hyponatremia 05/21/2013   ARF (acute renal failure) secondary to dehydration from osmotic diuresis 05/21/2013   HTN (hypertension) 05/21/2013   Hyperlipidemia 05/21/2013   GERD (gastroesophageal reflux disease) 05/21/2013   Infection due to trichomonas 05/21/2013  REFERRING DIAG: P71.062 (ICD-10-CM) - Presence of left artificial knee joint   REFERRING PROVIDER: Hector Shade, MD  THERAPY DIAG:  Difficulty in walking, not elsewhere classified  Muscle weakness (generalized)  Pain in joint of left knee  Pain in joint of right knee   PERTINENT HISTORY: PERTINENT HISTORY: R TKA in 2019 or 2020 and revision done March 2021 in R, peripheral neuropathy, DDD, HTN   PRECAUTIONS: N/A  SUBJECTIVE: Pt states the knees are stiff like always.  PAIN:  Are you having pain? Yes VAS scale:  2/10  Pain location: L knee; R knee stiff PAIN TYPE: stiff/tight Pain description: intermittent     OBJECTIVE:   SLS: <10s bilat  LEFS 36/80 points (or 45.00%)  5XSTS 18.3s   Gait: level surface gait without AD   TODAY'S TREATMENT:  04/05/21- LAND  Nu-step 6.5 mins Level 6  Leg Press: White leg press 65 lbs 5x5 STS  5x5 4lbs low table height Knee ext 3x10 15lbs Tandem balance on foam 30s 3x Foam marching 20x  04/10/21-POOL  Pt seen for aquatic therapy today.  Treatment took place in water 3.25-4 ft in depth at the [employer] pool. Temp of water was 94 deg.  Pt entered/exited the pool via stairs (step to pattern) with bilat rail and assistive device (walker/rolling cart) once getting out of pool.  Seated Kicking at hip 2 x 30 reps Cycling forward and retro 2 x 30 revolutions.  Standing hip flex; extension; add/abd; and hip circles supported by pool wall  2x12 Squats 2 x 10 pool bottom Walking with buoy submerged to hips x 6 widths.  With vc for encouragement for core engagement  Pt requires buoyancy for support and to offload joints with strengthening exercises. Viscosity of the water is needed for resistance of strengthening; water current perturbations provides challenge to standing balance unsupported, requiring increased core activation.       PATIENT EDUCATION:  Education details: anatomy, exercise progression, DOMS expectations, muscle firing, relaxation, envelope of function, HEP, POC   Person educated: Patient Education method: Explanation, Demonstration, Tactile cues, Verbal cues, and Handouts Education comprehension: verbalized understanding, returned demonstration, verbal cues required, tactile cues required, and needs further education     HOME EXERCISE PROGRAM: Access Code: [email or web address] added aquatics 12/7  URL: [email or web address] Date: 02/08/2021 Prepared by: Daleen Bo       ASSESSMENT:   CLINICAL IMPRESSION: Due to  mixed compliance with HEP, therapy reviewed land based strengthening exercise and gave edu about appropriate poundage with exercise machines in order provided adequate challenge in order to induce adaptations. Pt able to ambulate in clinic throughout session without AD. Pt demonstrates improved confidence with walking while with therapy. Plan to continue with strength and balance on land. Pt would benefit from continued skilled therapy in order to reach goals and maximize functional bilateral  strength and ROM for full return to PLOF.      REHAB POTENTIAL: Fair     CLINICAL DECISION MAKING: Stable/uncomplicated   EVALUATION COMPLEXITY: Low     GOALS:     SHORT TERM GOALS:   STG Name Target Date Goal status  1 Pt will become independent with HEP in order to demonstrate synthesis of PT education.   :  02/22/2021 Ongoing  2 Pt will have an at least 9 pt improvement in LEFS measure in order to demonstrate MCID improvement in daily function.   :  03/08/2021 ongoing  3 Pt will report at least 2 pt reduction on VAS scale  for pain in order to demonstrate functional improvement with household activity, self care, and ADL.   : 03/08/2021 Met    LONG TERM GOALS:    LTG Name Target Date Goal status  1 Pt  will become independent with final HEP in order to demonstrate synthesis of PT education.     04/05/2021 ongoing  2 Pt will be able to demonstrate/report ability to walk >15 mins without pain in order to demonstrate functional improvement and tolerance to exercise and community mobility.   04/05/2021 ongoing  3 Pt will be able to demonstrate/report ability to sit/stand for >25 min periods of time without pain in order to demonstrate functional improvement and tolerance to static positioning.    04/05/2021 ongoing  4 Pt will be able to demonstrate ability to walk without AD in order to demonstrate functional improvement in LE function for self-care and house hold duties.     04/05/2021  ongoing    PLAN: PT FREQUENCY: 2x/week   PT DURATION: 5 weeks   PLANNED INTERVENTIONS: Therapeutic exercises, Therapeutic activity, Neuro Muscular re-education, Balance training, Gait training, Patient/Family education, Joint mobilization, Stair training, Orthotic/Fit training, Aquatic Therapy, Dry Needling, Electrical stimulation, Spinal mobilization, Cryotherapy, Moist heat, scar mobilization, Taping, Vasopneumatic device, Traction, Ultrasound, Ionotophoresis 57m/ml Dexamethasone, and Manual therapy   PLAN FOR NEXT SESSION:  alternate land and pool, pool for dynamic balance and stability, land for gait, ROM, strength   ADaleen BoPT, DPT 04/19/21 10:15 AM

## 2021-04-21 ENCOUNTER — Ambulatory Visit (HOSPITAL_BASED_OUTPATIENT_CLINIC_OR_DEPARTMENT_OTHER): Payer: Medicare HMO | Admitting: Physical Therapy

## 2021-04-21 ENCOUNTER — Other Ambulatory Visit: Payer: Self-pay

## 2021-04-21 ENCOUNTER — Encounter (HOSPITAL_BASED_OUTPATIENT_CLINIC_OR_DEPARTMENT_OTHER): Payer: Self-pay | Admitting: Physical Therapy

## 2021-04-21 DIAGNOSIS — M25561 Pain in right knee: Secondary | ICD-10-CM

## 2021-04-21 DIAGNOSIS — R262 Difficulty in walking, not elsewhere classified: Secondary | ICD-10-CM | POA: Diagnosis not present

## 2021-04-21 DIAGNOSIS — M25562 Pain in left knee: Secondary | ICD-10-CM

## 2021-04-21 DIAGNOSIS — M6281 Muscle weakness (generalized): Secondary | ICD-10-CM

## 2021-04-21 NOTE — Therapy (Signed)
OUTPATIENT PHYSICAL THERAPY TREATMENT NOTE  Patient Name: Connie Branch MRN: [id number] DOB:04-Jan-1951, 70 y.o., female Today's Date: 04/21/2021  PCP: Kathyrn Lass, MD REFERRING PROVIDER: Kathyrn Lass, MD   PT End of Session - 04/21/21 0900     Visit Number 19    Number of Visits 25    Date for PT Re-Evaluation 07/04/21    Authorization Type [employer] Medicare    PT Start Time 0910    PT Stop Time 0950    PT Time Calculation (min) 40 min    Equipment Utilized During Treatment Other (comment)    Activity Tolerance Patient tolerated treatment well    Behavior During Therapy [facility] for tasks assessed/performed                Past Medical History:  Diagnosis Date   Anxiety    panic attacks   ARF (acute renal failure) secondary to dehydration from osmotic diuresis 05/21/2013   KIDNEY FUNCTION NORMAL NOW   Arthritis    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    Cataracts, bilateral    DDD (degenerative disc disease)    DKA (diabetic ketoacidoses) 05/21/2013   TYPE 2   GERD (gastroesophageal reflux disease)    Headache    SINUS   High cholesterol    History of claustrophobia    Hypertension    Neuropathy 2015   feet   Obesity    Osteopenia    PONV (postoperative nausea and vomiting)    Past Surgical History:  Procedure Laterality Date   ABDOMINAL HYSTERECTOMY     COMPLETE   BREAST EXCISIONAL BIOPSY Left 04/20/2020   ALH   BREAST LUMPECTOMY WITH RADIOACTIVE SEED LOCALIZATION Left 01/04/2021   Procedure: LEFT BREAST LUMPECTOMY WITH RADIOACTIVE SEED LOCALIZATION X 3;  Surgeon: Jovita Kussmaul, MD;  Location: [place];  Service: General;  Laterality: Left;   BREAST SURGERY Left 04/20/2020   BX   JOINT REPLACEMENT     KNEE JOINT MANIPULATION Right    OVARIAN CYST AND OVARY REMOVED  YRS AGO   TOTAL KNEE ARTHROPLASTY Right 06/21/2017   Procedure: RIGHT TOTAL KNEE ARTHROPLASTY;  Surgeon: Dorna Leitz, MD;  Location[facility];  Service:  Orthopedics;  Laterality: Right;   TOTAL KNEE ARTHROPLASTY Left 07/18/2020   Procedure: TOTAL KNEE ARTHROPLASTY;  Surgeon: Gaynelle Arabian, MD;  Location: [facility];  Service: Orthopedics;  Laterality: Left;  10min   TOTAL KNEE REVISION Right 07/22/2019   Procedure: Right knee polyethylene exchange;  Surgeon: Gaynelle Arabian, MD;  Location: [facility];  Service: Orthopedics;  Laterality: Right;  170min   Patient Active Problem List   Diagnosis Date Noted   Atypical lobular hyperplasia [facility]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 09/13/2020   Acute diverticulitis 08/15/2020   Hypomagnesemia 08/15/2020   Hypocalcemia 08/15/2020   Type 2 diabetes mellitus with hyperlipidemia ([place]) 08/15/2020   Anemia 08/15/2020   Failed total right knee replacement ([place]) 07/22/2019   Postoperative anemia due to acute blood loss 06/24/2017   Primary osteoarthritis of right knee 06/21/2017   Primary osteoarthritis of left knee 06/21/2017   DM (diabetes mellitus), secondary, uncontrolled, with ketoacidosis ([place]) 05/23/2013   Hypokalemia 05/22/2013   Obesity (BMI 30-39.9) 05/22/2013   DKA (diabetic ketoacidoses) 05/21/2013   Muscle spasm 05/21/2013   Hyponatremia 05/21/2013   ARF (acute renal failure) secondary to dehydration from osmotic diuresis 05/21/2013   HTN (hypertension) 05/21/2013   Hyperlipidemia 05/21/2013   GERD (gastroesophageal reflux disease) 05/21/2013   Infection due to trichomonas 05/21/2013  REFERRING DIAG: N45.886 (ICD-10-CM) - Presence of left artificial knee joint   REFERRING PROVIDER: Trudee Grip, MD  THERAPY DIAG:  Difficulty in walking, not elsewhere classified  Muscle weakness (generalized)  Pain in joint of left knee  Pain in joint of right knee   PERTINENT HISTORY: PERTINENT HISTORY: R TKA in 2019 or 2020 and revision done March 2021 in R, peripheral neuropathy, DDD, HTN   PRECAUTIONS: N/A  SUBJECTIVE: Pt states the knee are no longer painful. She still needs to walk downstairs backwards.    PAIN:  Are you having pain? No VAS scale: 0/10  Pain location: L knee; R knee stiff PAIN TYPE: stiff/tight Pain description: intermittent     OBJECTIVE:   SLS: <10s bilat  LEFS 36/80 points (or 45.00%)  5XSTS 18.3s   Gait: level surface gait without AD   TODAY'S TREATMENT:  12/16- LAND  Nu-step 6.5 mins Level 6  Leg Press: shuttle  leg press 75lbs 3x10 Hip ABD 3x10 55lbs LAQ 3x10 5lbs SLS with toe touch 30s 3x Foam marching 20x  04/05/21- LAND  Nu-step 6.5 mins Level 6  Leg Press: White leg press 75lbs 5x5 STS  5x5 4lbs low table height Knee ext 3x10 15lbs Tandem balance on foam 30s 3x Foam marching 20x  04/10/21-POOL  Pt seen for aquatic therapy today.  Treatment took place in water 3.25-4 ft in depth at the [facility] pool. Temp of water was 94 deg.  Pt entered/exited the pool via stairs (step to pattern) with bilat rail and assistive device (walker/rolling cart) once getting out of pool.  Seated Kicking at hip 2 x 30 reps Cycling forward and retro 2 x 30 revolutions.  Standing hip flex; extension; add/abd; and hip circles supported by pool wall  2x12 Squats 2 x 10 pool bottom Walking with buoy submerged to hips x 6 widths.  With vc for encouragement for core engagement  Pt requires buoyancy for support and to offload joints with strengthening exercises. Viscosity of the water is needed for resistance of strengthening; water current perturbations provides challenge to standing balance unsupported, requiring increased core activation.       PATIENT EDUCATION:  Education details: anatomy, exercise progression, DOMS expectations, muscle firing, relaxation, envelope of function, HEP, POC   Person educated: Patient Education method: Explanation, Demonstration, Tactile cues, Verbal cues, and Handouts Education comprehension: verbalized understanding, returned demonstration, verbal cues required, tactile cues required, and needs further  education     HOME EXERCISE PROGRAM: Access Code: [email or web address] added aquatics 12/7  URL: [email or web address] Date: 02/08/2021 Prepared by: Zebedee Iba       ASSESSMENT:   CLINICAL IMPRESSION: Pt able to continue with strengthening and balance exercise on land. Pt able to continue with gym based stregnthening. Pt appears apprehensive with adding adequate intensity to knee strengthening independently and requires PT to push towards RPE 7 intensity. Plan to work on eccentric quad strength and dynamic balance so pt can move to LRAD/no AD by D/C.  Pt would benefit from continued skilled therapy in order to reach goals and maximize functional bilateral  strength and ROM for full return to PLOF.      REHAB POTENTIAL: Fair     CLINICAL DECISION MAKING: Stable/uncomplicated   EVALUATION COMPLEXITY: Low     GOALS:     SHORT TERM GOALS:   STG Name Target Date Goal status  1 Pt will become independent with HEP in order to demonstrate synthesis of PT education.   :  02/22/2021 Ongoing  2 Pt will have an at least 9 pt improvement in LEFS measure in order to demonstrate MCID improvement in daily function.   :  03/08/2021 ongoing  3 Pt will report at least 2 pt reduction on VAS scale for pain in order to demonstrate functional improvement with household activity, self care, and ADL.   : 03/08/2021 Met    LONG TERM GOALS:    LTG Name Target Date Goal status  1 Pt  will become independent with final HEP in order to demonstrate synthesis of PT education.     04/05/2021 ongoing  2 Pt will be able to demonstrate/report ability to walk >15 mins without pain in order to demonstrate functional improvement and tolerance to exercise and community mobility.   04/05/2021 ongoing  3 Pt will be able to demonstrate/report ability to sit/stand for >25 min periods of time without pain in order to demonstrate functional improvement and tolerance to static positioning.    04/05/2021 ongoing  4  Pt will be able to demonstrate ability to walk without AD in order to demonstrate functional improvement in LE function for self-care and house hold duties.     04/05/2021 ongoing    PLAN: PT FREQUENCY: 2x/week   PT DURATION: 5 weeks   PLANNED INTERVENTIONS: Therapeutic exercises, Therapeutic activity, Neuro Muscular re-education, Balance training, Gait training, Patient/Family education, Joint mobilization, Stair training, Orthotic/Fit training, Aquatic Therapy, Dry Needling, Electrical stimulation, Spinal mobilization, Cryotherapy, Moist heat, scar mobilization, Taping, Vasopneumatic device, Traction, Ultrasound, Ionotophoresis 4mg /ml Dexamethasone, and Manual therapy   PLAN FOR NEXT SESSION:  land for balance, ROM, strength   Daleen Bo PT, DPT 04/21/21 10:27 AM

## 2021-04-24 ENCOUNTER — Ambulatory Visit (HOSPITAL_BASED_OUTPATIENT_CLINIC_OR_DEPARTMENT_OTHER): Payer: Medicare HMO | Admitting: Physical Therapy

## 2021-04-24 ENCOUNTER — Encounter (HOSPITAL_BASED_OUTPATIENT_CLINIC_OR_DEPARTMENT_OTHER): Payer: Self-pay | Admitting: Physical Therapy

## 2021-04-24 ENCOUNTER — Other Ambulatory Visit: Payer: Self-pay

## 2021-04-24 DIAGNOSIS — R262 Difficulty in walking, not elsewhere classified: Secondary | ICD-10-CM | POA: Diagnosis not present

## 2021-04-24 DIAGNOSIS — M25562 Pain in left knee: Secondary | ICD-10-CM

## 2021-04-24 DIAGNOSIS — M6281 Muscle weakness (generalized): Secondary | ICD-10-CM

## 2021-04-24 DIAGNOSIS — M25561 Pain in right knee: Secondary | ICD-10-CM

## 2021-04-24 NOTE — Therapy (Signed)
OUTPATIENT PHYSICAL THERAPY TREATMENT NOTE  Patient Name: Connie Branch MRN: [id number] DOB:[birth year], 70 y.o., female Today's Date: 04/24/2021  PCP: Kathyrn Lass, MD REFERRING PROVIDER: Kathyrn Lass, MD   PT End of Session - 04/24/21 1006     Visit Number 20    Number of Visits 25    Date for PT Re-Evaluation 07/04/21    Authorization Type Aetna Medicare    PT Start Time 4098    PT Stop Time 1100    PT Time Calculation (min) 45 min    Equipment Utilized During Treatment Other (comment)    Activity Tolerance Patient tolerated treatment well    Behavior During Therapy 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    Cataracts, bilateral    DDD (degenerative disc disease)    DKA (diabetic ketoacidoses) 05/21/2013   TYPE 2   GERD (gastroesophageal reflux disease)    Headache    SINUS   High cholesterol    History of claustrophobia    Hypertension    Neuropathy 2015   feet   Obesity    Osteopenia    PONV (postoperative nausea and vomiting)    Past Surgical History:  Procedure Laterality Date   ABDOMINAL HYSTERECTOMY     COMPLETE   BREAST EXCISIONAL BIOPSY Left 04/20/2020   ALH   BREAST LUMPECTOMY WITH RADIOACTIVE SEED LOCALIZATION Left 01/04/2021   Procedure: LEFT BREAST LUMPECTOMY WITH RADIOACTIVE SEED LOCALIZATION X 3;  Surgeon: Jovita Kussmaul, MD;  Location: [place];  Service: General;  Laterality: Left;   BREAST SURGERY Left 04/20/2020   BX   JOINT REPLACEMENT     KNEE JOINT MANIPULATION Right    OVARIAN CYST AND OVARY REMOVED  YRS AGO   TOTAL KNEE ARTHROPLASTY Right 06/21/2017   Procedure: RIGHT TOTAL KNEE ARTHROPLASTY;  Surgeon: Dorna Leitz, MD;  [facility];  Service:  Orthopedics;  Laterality: Right;   TOTAL KNEE ARTHROPLASTY Left 07/18/2020   Procedure: TOTAL KNEE ARTHROPLASTY;  Surgeon: Gaynelle Arabian, MD;  [facility];  Service: Orthopedics;  Laterality: Left;  27min   TOTAL KNEE REVISION Right 07/22/2019   Procedure: Right knee polyethylene exchange;  Surgeon: Gaynelle Arabian, MD;  [facility];  Service: Orthopedics;  Laterality: Right;  135min   Patient Active Problem List   Diagnosis Date Noted   Atypical lobular hyperplasia [facility]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 09/13/2020   Acute diverticulitis 08/15/2020   Hypomagnesemia 08/15/2020   Hypocalcemia 08/15/2020   Type 2 diabetes mellitus with hyperlipidemia ([place]) 08/15/2020   Anemia 08/15/2020   Failed total right knee replacement ([place]) 07/22/2019   Postoperative anemia due to acute blood loss 06/24/2017   Primary osteoarthritis of right knee 06/21/2017   Primary osteoarthritis of left knee 06/21/2017   DM (diabetes mellitus), secondary, uncontrolled, with ketoacidosis ([place]) 05/23/2013   Hypokalemia 05/22/2013   Obesity (BMI 30-39.9) 05/22/2013   DKA (diabetic ketoacidoses) 05/21/2013   Muscle spasm 05/21/2013   Hyponatremia 05/21/2013   ARF (acute renal failure) secondary to dehydration from osmotic diuresis 05/21/2013   HTN (hypertension) 05/21/2013   Hyperlipidemia 05/21/2013   GERD (gastroesophageal reflux disease) 05/21/2013   Infection due to trichomonas  05/21/2013    REFERRING DIAG: G92.119 (ICD-10-CM) - Presence of left artificial knee joint   REFERRING PROVIDER: Hector Shade, MD  THERAPY DIAG:  Difficulty in walking, not elsewhere classified  Pain in joint of left knee  Muscle weakness (generalized)  Pain in joint of right knee   PERTINENT HISTORY: PERTINENT HISTORY: R TKA in 2019 or 2020 and revision done March 2021 in R, peripheral neuropathy, DDD, HTN   PRECAUTIONS: N/A  SUBJECTIVE: Pt states the knee are no longer painful. She still needs to walk downstairs backwards.    PAIN:  Are you having pain? No VAS scale: 0/10  Pain location: L knee; R knee stiff PAIN TYPE: stiff/tight Pain description: intermittent     OBJECTIVE:   SLS: <10s bilat  LEFS 36/80 points (or 45.00%)  5XSTS 18.3s   Gait: level surface gait without AD   TODAY'S TREATMENT:   12/19- LAND  Nu-step 6.5 mins Level 6  Lateral lunge 2x10 Fwd lunge 2x10 [place]is[place]at UE support 3x10 Tandem balance 30s 3x each  STS from chair 2x10 SLS with toe touch 15s 2x  12/16- LAND  Nu-step 6.5 mins Level 6  Leg Press: shuttle  leg press 75lbs 3x10 Hip ABD 3x10 55lbs LAQ 3x10 5lbs SLS with toe touch 30s 3x Foam marching 20x  04/05/21- LAND  Nu-step 6.5 mins Level 6  Leg Press: White leg press 75lbs 5x5 STS  5x5 4lbs low table height Knee ext 3x10 15lbs Tandem balance on foam 30s 3x Foam marching 20x  04/10/21-POOL  Pt seen for aquatic therapy today.  Treatment took place in water 3.25-4 ft in depth at the [facility] pool. Temp of water was 94 deg.  Pt entered/exited the pool via stairs (step to pattern) with bilat rail and assistive device (walker/rolling cart) once getting out of pool.  Seated Kicking at hip 2 x 30 reps Cycling forward and retro 2 x 30 revolutions.  Standing hip flex; extension; add/abd; and hip circles supported by pool wall  2x12 Squats 2 x 10 pool bottom Walking with buoy submerged to hips x 6 widths.  With vc for encouragement for core engagement  Pt requires buoyancy for support and to offload joints with strengthening exercises. Viscosity of the water is needed for resistance of strengthening; water current perturbations provides challenge to standing balance unsupported, requiring increased core activation.       PATIENT EDUCATION:  Education details: anatomy, exercise progression, DOMS expectations, muscle firing, relaxation, envelope of function, HEP, POC   Person educated: Patient Education method: Explanation, Demonstration,  Tactile cues, Verbal cues, and Handouts Education comprehension: verbalized understanding, returned demonstration, verbal cues required, tactile cues required, and needs further education     HOME EXERCISE PROGRAM: Access Code: [email or web address] added aquatics 12/7  URL: [email or web address] Date: 02/08/2021 Prepared by: Daleen Bo       ASSESSMENT:   CLINICAL IMPRESSION: Pt given land based HEP without use of machinery given report of long wait times or needing assistance with gym equipment. Pt has noticeable L quad extensor weakness as compared to R, but likely exacerbated by pt apprehension. Pt requires consistent reinforcement of challenging herself with exercise. Plan to continue with dynamic balance, gait, and strength training on land. Pt would benefit from continued skilled therapy in order to reach goals and maximize functional bilateral  strength and ROM for full return to PLOF.      REHAB POTENTIAL: Fair     CLINICAL DECISION MAKING: Stable/uncomplicated   EVALUATION COMPLEXITY: Low  GOALS:     SHORT TERM GOALS:   STG Name Target Date Goal status  1 Pt will become independent with HEP in order to demonstrate synthesis of PT education.   :  02/22/2021 Ongoing  2 Pt will have an at least 9 pt improvement in LEFS measure in order to demonstrate MCID improvement in daily function.   :  03/08/2021 ongoing  3 Pt will report at least 2 pt reduction on VAS scale for pain in order to demonstrate functional improvement with household activity, self care, and ADL.   : 03/08/2021 Met    LONG TERM GOALS:    LTG Name Target Date Goal status  1 Pt  will become independent with final HEP in order to demonstrate synthesis of PT education.     04/05/2021 ongoing  2 Pt will be able to demonstrate/report ability to walk >15 mins without pain in order to demonstrate functional improvement and tolerance to exercise and community mobility.   04/05/2021 ongoing  3 Pt will be  able to demonstrate/report ability to sit/stand for >25 min periods of time without pain in order to demonstrate functional improvement and tolerance to static positioning.    04/05/2021 ongoing  4 Pt will be able to demonstrate ability to walk without AD in order to demonstrate functional improvement in LE function for self-care and house hold duties.     04/05/2021 ongoing    PLAN: PT FREQUENCY: 2x/week   PT DURATION: 5 weeks   PLANNED INTERVENTIONS: Therapeutic exercises, Therapeutic activity, Neuro Muscular re-education, Balance training, Gait training, Patient/Family education, Joint mobilization, Stair training, Orthotic/Fit training, Aquatic Therapy, Dry Needling, Electrical stimulation, Spinal mobilization, Cryotherapy, Moist heat, scar mobilization, Taping, Vasopneumatic device, Traction, Ultrasound, Ionotophoresis 4mg /ml Dexamethasone, and Manual therapy   PLAN FOR NEXT SESSION:  land for balance, ROM, strength   Daleen Bo PT, DPT 04/24/21 11:27 AM

## 2021-04-26 ENCOUNTER — Ambulatory Visit
Admission: RE | Admit: 2021-04-26 | Discharge: 2021-04-26 | Disposition: A | Discharge status: Home / Self Care | Payer: Medicare HMO | Source: Ambulatory Visit | Attending: Hematology and Oncology | Admitting: Hematology and Oncology

## 2021-04-26 DIAGNOSIS — N6092 Unspecified benign mammary dysplasia of left breast: Secondary | ICD-10-CM

## 2021-04-26 DIAGNOSIS — Z9189 Other specified personal risk factors, not elsewhere classified: Secondary | ICD-10-CM

## 2021-04-27 ENCOUNTER — Encounter (HOSPITAL_BASED_OUTPATIENT_CLINIC_OR_DEPARTMENT_OTHER): Payer: Self-pay | Admitting: Physical Therapy

## 2021-04-27 ENCOUNTER — Encounter (HOSPITAL_BASED_OUTPATIENT_CLINIC_OR_DEPARTMENT_OTHER): Payer: Medicare HMO | Admitting: Physical Therapy

## 2021-04-27 ENCOUNTER — Other Ambulatory Visit: Payer: Self-pay

## 2021-04-27 ENCOUNTER — Ambulatory Visit (HOSPITAL_BASED_OUTPATIENT_CLINIC_OR_DEPARTMENT_OTHER): Payer: Medicare HMO | Admitting: Physical Therapy

## 2021-04-27 DIAGNOSIS — M25561 Pain in right knee: Secondary | ICD-10-CM

## 2021-04-27 DIAGNOSIS — R262 Difficulty in walking, not elsewhere classified: Secondary | ICD-10-CM | POA: Diagnosis not present

## 2021-04-27 DIAGNOSIS — M25562 Pain in left knee: Secondary | ICD-10-CM

## 2021-04-27 DIAGNOSIS — M6281 Muscle weakness (generalized): Secondary | ICD-10-CM

## 2021-04-27 NOTE — Therapy (Signed)
OUTPATIENT PHYSICAL THERAPY TREATMENT NOTE  Patient Name: Connie Branch MRN: [id number] DOB:1951-01-17, 70 y.o., female Today's Date: 04/27/2021  PCP: Kathyrn Lass, MD REFERRING PROVIDER: Kathyrn Lass, MD   PT End of Session - 04/27/21 1435     Visit Number 21    Number of Visits 25    Date for PT Re-Evaluation 07/04/21    Authorization Type [employer] Medicare    PT Start Time 1430    PT Stop Time 1510    PT Time Calculation (min) 40 min    Equipment Utilized During Treatment Other (comment)    Activity Tolerance Patient tolerated treatment well    Behavior During Therapy [facility] for tasks assessed/performed                  Past Medical History:  Diagnosis Date   Anxiety    panic attacks   ARF (acute renal failure) secondary to dehydration from osmotic diuresis 05/21/2013   KIDNEY FUNCTION NORMAL NOW   Arthritis    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    Cataracts, bilateral    DDD (degenerative disc disease)    DKA (diabetic ketoacidoses) 05/21/2013   TYPE 2   GERD (gastroesophageal reflux disease)    Headache    SINUS   High cholesterol    History of claustrophobia    Hypertension    Neuropathy 2015   feet   Obesity    Osteopenia    PONV (postoperative nausea and vomiting)    Past Surgical History:  Procedure Laterality Date   ABDOMINAL HYSTERECTOMY     COMPLETE   BREAST EXCISIONAL BIOPSY Left 04/20/2020   ALH   BREAST LUMPECTOMY WITH RADIOACTIVE SEED LOCALIZATION Left 01/04/2021   Procedure: LEFT BREAST LUMPECTOMY WITH RADIOACTIVE SEED LOCALIZATION X 3;  Surgeon: Jovita Kussmaul, MD;  Location: [place];  Service: General;  Laterality: Left;   BREAST SURGERY Left 04/20/2020   BX   JOINT REPLACEMENT     KNEE JOINT MANIPULATION Right    OVARIAN CYST AND OVARY REMOVED  YRS AGO   TOTAL KNEE ARTHROPLASTY Right 06/21/2017   Procedure: RIGHT TOTAL KNEE ARTHROPLASTY;  Surgeon: Dorna Leitz, MD;  Location: [facility];  Service:  Orthopedics;  Laterality: Right;   TOTAL KNEE ARTHROPLASTY Left 07/18/2020   Procedure: TOTAL KNEE ARTHROPLASTY;  Surgeon: Gaynelle Arabian, MD;  Location: [facility];  Service: Orthopedics;  Laterality: Left;  2min   TOTAL KNEE REVISION Right 07/22/2019   Procedure: Right knee polyethylene exchange;  Surgeon: Gaynelle Arabian, MD;  Location: [facility];  Service: Orthopedics;  Laterality: Right;  156min   Patient Active Problem List   Diagnosis Date Noted   Atypical lobular hyperplasia [facility]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 09/13/2020   Acute diverticulitis 08/15/2020   Hypomagnesemia 08/15/2020   Hypocalcemia 08/15/2020   Type 2 diabetes mellitus with hyperlipidemia ([place]) 08/15/2020   Anemia 08/15/2020   Failed total right knee replacement ([place]) 07/22/2019   Postoperative anemia due to acute blood loss 06/24/2017   Primary osteoarthritis of right knee 06/21/2017   Primary osteoarthritis of left knee 06/21/2017   DM (diabetes mellitus), secondary, uncontrolled, with ketoacidosis ([place]) 05/23/2013   Hypokalemia 05/22/2013   Obesity (BMI 30-39.9) 05/22/2013   DKA (diabetic ketoacidoses) 05/21/2013   Muscle spasm 05/21/2013   Hyponatremia 05/21/2013   ARF (acute renal failure) secondary to dehydration from osmotic diuresis 05/21/2013   HTN (hypertension) 05/21/2013   Hyperlipidemia 05/21/2013   GERD (gastroesophageal reflux disease) 05/21/2013   Infection due to  trichomonas 05/21/2013    REFERRING DIAG: W97.948 (ICD-10-CM) - Presence of left artificial knee joint   REFERRING PROVIDER: Hector Shade, MD  THERAPY DIAG:  Difficulty in walking, not elsewhere classified  Pain in joint of left knee  Muscle weakness (generalized)  Pain in joint of right knee   PERTINENT HISTORY: PERTINENT HISTORY: R TKA in 2019 or 2020 and revision done March 2021 in R, peripheral neuropathy, DDD, HTN   PRECAUTIONS: N/A  SUBJECTIVE: Pt states her L HS was swollen this morning. She needed a Tylenol to start walking. She is  doing more movement and exercise at home.   PAIN:  Are you having pain? No VAS scale: 0/10  Pain location: L knee; R knee stiff PAIN TYPE: stiff/tight Pain description: intermittent     OBJECTIVE:   SLS: <10s bilat  LEFS 36/80 points (or 45.00%)  5XSTS 18.3s   TODAY'S TREATMENT:  12/22- LAND  Nu-step 8 mins Level 6  Lateral lunge 2x10 Lateral and fwd step up 4" box 2x10 each Fwd lunge 2x10 Minisquat UE support 3x10 STS from low table height 2x10 Stairs 4 steps reciprocal up/ step to down (down with L) 3x   12/19- LAND  Nu-step 6.5 mins Level 6  Lateral lunge 2x10 Fwd lunge 2x10 Minisquat UE support 3x10 Tandem balance 30s 3x each  STS from chair 2x10 SLS with toe touch 15s 2x  12/16- LAND  Nu-step 6.5 mins Level 6  Leg Press: shuttle  leg press 75lbs 3x10 Hip ABD 3x10 55lbs LAQ 3x10 5lbs SLS with toe touch 30s 3x Foam marching 20x  04/05/21- LAND  Nu-step 6.5 mins Level 6  Leg Press: White leg press 75lbs 5x5 STS  5x5 4lbs low table height Knee ext 3x10 15lbs Tandem balance on foam 30s 3x Foam marching 20x       PATIENT EDUCATION:  Education details:  exercise progression, muscle firing, relaxation, envelope of function, HEP, POC   Person educated: Patient Education method: Explanation, Demonstration, Tactile cues, Verbal cues, and Handouts Education comprehension: verbalized understanding, returned demonstration, verbal cues required, tactile cues required, and needs further education     HOME EXERCISE PROGRAM: Access Code: [email or web address] added aquatics 12/7  URL: [email or web address] Date: 02/08/2021 Prepared by: Daleen Bo       ASSESSMENT:   CLINICAL IMPRESSION: Pt able to demonstrate safe management of stairs with single UE support. However, still limited by L knee strength. Pt required cuing for transfer mechanics in order to improve efficiency as well as promote equal WB. Plan to perform unilateral strengthening at next  session and more SL balance activity. Pt would benefit from continued skilled therapy in order to reach goals and maximize functional bilateral  strength and ROM for full return to PLOF.      REHAB POTENTIAL: Fair     CLINICAL DECISION MAKING: Stable/uncomplicated   EVALUATION COMPLEXITY: Low     GOALS:     SHORT TERM GOALS:   STG Name Target Date Goal status  1 Pt will become independent with HEP in order to demonstrate synthesis of PT education.   :  02/22/2021 Ongoing  2 Pt will have an at least 9 pt improvement in LEFS measure in order to demonstrate MCID improvement in daily function.   :  03/08/2021 ongoing  3 Pt will report at least 2 pt reduction on VAS scale for pain in order to demonstrate functional improvement with household activity, self care, and ADL.   : 03/08/2021 Met    LONG  TERM GOALS:    LTG Name Target Date Goal status  1 Pt  will become independent with final HEP in order to demonstrate synthesis of PT education.     04/05/2021 ongoing  2 Pt will be able to demonstrate/report ability to walk >15 mins without pain in order to demonstrate functional improvement and tolerance to exercise and community mobility.   04/05/2021 ongoing  3 Pt will be able to demonstrate/report ability to sit/stand for >25 min periods of time without pain in order to demonstrate functional improvement and tolerance to static positioning.    04/05/2021 ongoing  4 Pt will be able to demonstrate ability to walk without AD in order to demonstrate functional improvement in LE function for self-care and house hold duties.     04/05/2021 ongoing    PLAN: PT FREQUENCY: 2x/week   PT DURATION: 5 weeks   PLANNED INTERVENTIONS: Therapeutic exercises, Therapeutic activity, Neuro Muscular re-education, Balance training, Gait training, Patient/Family education, Joint mobilization, Stair training, Orthotic/Fit training, Aquatic Therapy, Dry Needling, Electrical stimulation, Spinal  mobilization, Cryotherapy, Moist heat, scar mobilization, Taping, Vasopneumatic device, Traction, Ultrasound, Ionotophoresis 4mg /ml Dexamethasone, and Manual therapy   PLAN FOR NEXT SESSION:  land for balance, ROM, strength   Daleen Bo PT, DPT 04/27/21 3:20 PM

## 2021-05-03 ENCOUNTER — Ambulatory Visit (HOSPITAL_BASED_OUTPATIENT_CLINIC_OR_DEPARTMENT_OTHER): Payer: Medicare HMO | Admitting: Physical Therapy

## 2021-05-09 ENCOUNTER — Ambulatory Visit (HOSPITAL_BASED_OUTPATIENT_CLINIC_OR_DEPARTMENT_OTHER): Payer: Medicare HMO | Admitting: Physical Therapy

## 2021-05-10 ENCOUNTER — Other Ambulatory Visit: Payer: Self-pay

## 2021-05-10 ENCOUNTER — Encounter (HOSPITAL_BASED_OUTPATIENT_CLINIC_OR_DEPARTMENT_OTHER): Payer: Self-pay | Admitting: Physical Therapy

## 2021-05-10 ENCOUNTER — Ambulatory Visit (HOSPITAL_BASED_OUTPATIENT_CLINIC_OR_DEPARTMENT_OTHER): Payer: Medicare HMO | Attending: Orthopedic Surgery | Admitting: Physical Therapy

## 2021-05-10 DIAGNOSIS — G8929 Other chronic pain: Secondary | ICD-10-CM | POA: Insufficient documentation

## 2021-05-10 DIAGNOSIS — M25562 Pain in left knee: Secondary | ICD-10-CM | POA: Insufficient documentation

## 2021-05-10 DIAGNOSIS — M6281 Muscle weakness (generalized): Secondary | ICD-10-CM | POA: Diagnosis present

## 2021-05-10 DIAGNOSIS — R262 Difficulty in walking, not elsewhere classified: Secondary | ICD-10-CM | POA: Insufficient documentation

## 2021-05-10 DIAGNOSIS — M25561 Pain in right knee: Secondary | ICD-10-CM | POA: Insufficient documentation

## 2021-05-10 NOTE — Therapy (Signed)
OUTPATIENT PHYSICAL THERAPY PROGRESS NOTE  Patient Name: Connie Branch MRN: [id number] DOB:[birth year], 71 y.o., female Today's Date: 05/10/2021  PCP: Kathyrn Lass, MD REFERRING PROVIDER: Kathyrn Lass, MD   PT End of Session - 05/10/21 1017     Visit Number 22    Number of Visits 25    Date for PT Re-Evaluation 07/04/21    Authorization Type [employer]na Medicare    PT Start Time 9562    PT Stop Time 1055    PT Time Calculation (min) 40 min    Equipment Utilized During Treatment Other (comment)    Activity Tolerance Patient tolerated treatment well    Behavior During Therapy 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    Cataracts, bilateral    DDD (degenerative disc disease)    DKA (diabetic ketoacidoses) 05/21/2013   TYPE 2   GERD (gastroesophageal reflux disease)    Headache    SINUS   High cholesterol    History of claustrophobia    Hypertension    Neuropathy 2015   feet   Obesity    Osteopenia    PONV (postoperative nausea and vomiting)    Past Surgical History:  Procedure Laterality Date   ABDOMINAL HYSTERECTOMY     COMPLETE   BREAST EXCISIONAL BIOPSY Left 04/20/2020   ALH   BREAST LUMPECTOMY WITH RADIOACTIVE SEED LOCALIZATION Left 01/04/2021   Procedure: LEFT BREAST LUMPECTOMY WITH RADIOACTIVE SEED LOCALIZATION X 3;  Surgeon: Jovita Kussmaul, MD;  Location: [place];  Service: General;  Laterality: Left;   BREAST SURGERY Left 04/20/2020   BX   JOINT REPLACEMENT     KNEE JOINT MANIPULATION Right    OVARIAN CYST AND OVARY REMOVED  YRS AGO   TOTAL KNEE ARTHROPLASTY Right 06/21/2017   Procedure: RIGHT TOTAL KNEE ARTHROPLASTY;  Surgeon: Dorna Leitz, MD;  Location: [facility];  Service:  Orthopedics;  Laterality: Right;   TOTAL KNEE ARTHROPLASTY Left 07/18/2020   Procedure: TOTAL KNEE ARTHROPLASTY;  Surgeon: Gaynelle Arabian, MD;  Location: [facility];  Service: Orthopedics;  Laterality: Left;  73min   TOTAL KNEE REVISION Right 07/22/2019   Procedure: Right knee polyethylene exchange;  Surgeon: Gaynelle Arabian, MD;  Location: [facility];  Service: Orthopedics;  Laterality: Right;  124min   Patient Active Problem List   Diagnosis Date Noted   Atypical lobular hyperplasia [facility]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 09/13/2020   Acute diverticulitis 08/15/2020   Hypomagnesemia 08/15/2020   Hypocalcemia 08/15/2020   Type 2 diabetes mellitus with hyperlipidemia ([place]) 08/15/2020   Anemia 08/15/2020   Failed total right knee replacement ([place]) 07/22/2019   Postoperative anemia due to acute blood loss 06/24/2017   Primary osteoarthritis of right knee 06/21/2017   Primary osteoarthritis of left knee 06/21/2017   DM (diabetes mellitus), secondary, uncontrolled, with ketoacidosis ([place]) 05/23/2013   Hypokalemia 05/22/2013   Obesity (BMI 30-39.9) 05/22/2013   DKA (diabetic ketoacidoses) 05/21/2013   Muscle spasm 05/21/2013   Hyponatremia 05/21/2013   ARF (acute renal failure) secondary to dehydration from osmotic diuresis 05/21/2013   HTN (hypertension) 05/21/2013   Hyperlipidemia 05/21/2013   GERD (gastroesophageal reflux disease) 05/21/2013   Infection due  to trichomonas 05/21/2013    REFERRING DIAG: V56.433 (ICD-10-CM) - Presence of left artificial knee joint   REFERRING PROVIDER: Hector Shade, MD  THERAPY DIAG:  Difficulty in walking, not elsewhere classified  Pain in joint of left knee  Muscle weakness (generalized)  Pain in joint of right knee   PERTINENT HISTORY: PERTINENT HISTORY: R TKA in 2019 or 2020 and revision done March 2021 in R, peripheral neuropathy, DDD, HTN   PRECAUTIONS: N/A  SUBJECTIVE: Pt states her L knee has been hurting more the last 2 weeks. She states the knee has been  more swollen as well as the HS. She has had to take more gabapentin and tramadol due to the pain recently. She states it has gotten better and   PAIN:  Are you having pain? No VAS scale: 0/10  Pain location: L knee; R knee stiff PAIN TYPE: stiff/tight Pain description: intermittent     OBJECTIVE:   No acute swelling noted TTP of medial joints/ligament  38/80 points (or 47.50%)  5XSTS 14.6s    LE AROM/PROM:   A/PROM Right 02/08/2021 Left 02/08/2021  Knee flexion 126 117  Knee extension 0 0   (Blank rows = not tested)   LE MMT:   MMT Right 02/08/2021 Left 02/08/2021  Hip extension 4+/5 4+/5  Hip abduction 4/5 4+/5  Knee flexion 4+/5 4+/5  Knee extension 4+/5 4+/5   (Blank rows = not tested)      TODAY'S TREATMENT:  05/10/21  Nu-step 6 mins Level 1  [place] knee stretch 5s 15x LAQ 4lbs 15x Obstacle navigation with pliant surface 4x laps Tandem balance on foam 30s 4x Tandem walking 75ft   12/22- LAND  Nu-step 8 mins Level 6  Lateral lunge 2x10 Lateral and fwd step up 4" box 2x10 each Fwd lunge 2x10 Minisquat UE support 3x10 STS from low table height 2x10 Stairs 4 steps reciprocal up/ step to down (down with L) 3x   12/19- LAND  Nu-step 6.5 mins Level 6  Lateral lunge 2x10 Fwd lunge 2x10 Minisquat UE support 3x10 Tandem balance 30s 3x each  STS from chair 2x10 SLS with toe touch 15s 2x  12/16- LAND  Nu-step 6.5 mins Level 6  Leg Press: shuttle  leg press 75lbs 3x10 Hip ABD 3x10 55lbs LAQ 3x10 5lbs SLS with toe touch 30s 3x Foam marching 20x  04/05/21- LAND  Nu-step 6.5 mins Level 6  Leg Press: White leg press 75lbs 5x5 STS  5x5 4lbs low table height Knee ext 3x10 15lbs Tandem balance on foam 30s 3x Foam marching 20x       PATIENT EDUCATION:  Education details:  PT progression,  anatomy, muscle firing, relaxation, envelope of function, HEP, POC  Person educated: Patient Education method: Explanation, Demonstration, Tactile  cues, Verbal cues, and Handouts Education comprehension: verbalized understanding, returned demonstration, verbal cues required, tactile cues required, and needs further education     HOME EXERCISE PROGRAM: Access Code: [email or web address] added aquatics 12/7  URL: [email or web address] Date: 02/08/2021 Prepared by: Daleen Bo       ASSESSMENT:   CLINICAL IMPRESSION: Pt with improvement in L knee ROM as well as L LE strength as compared to previous assessments. Pt has improved her STS strength/power output. Pt seems to have functionally plateaued with SL balance but likely able to progress with dynamic gait activity within the remainder of current POC. Due to slowed improvement, pt likely able to finish out current POC and progress towards independent strengthening and management. No acute signs  of inflammation or injury noted at today's session. Pt's recent complaint of swelling and pain may be related to continued edema within the knee and may need further medical management outside the scope of PT. Pt would benefit from continued skilled therapy in order to reach goals and maximize functional bilateral  strength and ROM for full return to PLOF.      REHAB POTENTIAL: Fair     CLINICAL DECISION MAKING: Stable/uncomplicated   EVALUATION COMPLEXITY: Low     GOALS:     SHORT TERM GOALS:   STG Name Target Date Goal status  1 Pt will become independent with HEP in order to demonstrate synthesis of PT education.   :  02/22/2021 Ongoing  2 Pt will have an at least 9 pt improvement in LEFS measure in order to demonstrate MCID improvement in daily function.   :  03/08/2021 ongoing  3 Pt will report at least 2 pt reduction on VAS scale for pain in order to demonstrate functional improvement with household activity, self care, and ADL.   : 03/08/2021 Met    LONG TERM GOALS:    LTG Name Target Date Goal status  1 Pt  will become independent with final HEP in order to demonstrate  synthesis of PT education.     04/05/2021 ongoing  2 Pt will be able to demonstrate/report ability to walk >15 mins without pain in order to demonstrate functional improvement and tolerance to exercise and community mobility.   04/05/2021 ongoing  3 Pt will be able to demonstrate/report ability to sit/stand for >25 min periods of time without pain in order to demonstrate functional improvement and tolerance to static positioning.    04/05/2021 ongoing  4 Pt will be able to demonstrate ability to walk without AD in order to demonstrate functional improvement in LE function for self-care and house hold duties.     04/05/2021 ongoing    PLAN: PT FREQUENCY: 2x/week   PT DURATION: 5 weeks   PLANNED INTERVENTIONS: Therapeutic exercises, Therapeutic activity, Neuro Muscular re-education, Balance training, Gait training, Patient/Family education, Joint mobilization, Stair training, Orthotic/Fit training, Aquatic Therapy, Dry Needling, Electrical stimulation, Spinal mobilization, Cryotherapy, Moist heat, scar mobilization, Taping, Vasopneumatic device, Traction, Ultrasound, Ionotophoresis 4mg /ml Dexamethasone, and Manual therapy   PLAN FOR NEXT SESSION:  land for balance, ROM, strength   Daleen Bo PT, DPT 05/10/21 11:45 AM

## 2021-05-12 ENCOUNTER — Ambulatory Visit (HOSPITAL_BASED_OUTPATIENT_CLINIC_OR_DEPARTMENT_OTHER): Payer: Medicare HMO | Admitting: Physical Therapy

## 2021-05-12 ENCOUNTER — Encounter (HOSPITAL_BASED_OUTPATIENT_CLINIC_OR_DEPARTMENT_OTHER): Payer: Medicare HMO | Admitting: Physical Therapy

## 2021-05-12 ENCOUNTER — Encounter (HOSPITAL_BASED_OUTPATIENT_CLINIC_OR_DEPARTMENT_OTHER): Payer: Self-pay | Admitting: Physical Therapy

## 2021-05-12 ENCOUNTER — Other Ambulatory Visit: Payer: Self-pay

## 2021-05-12 DIAGNOSIS — M25561 Pain in right knee: Secondary | ICD-10-CM

## 2021-05-12 DIAGNOSIS — M6281 Muscle weakness (generalized): Secondary | ICD-10-CM

## 2021-05-12 DIAGNOSIS — M25562 Pain in left knee: Secondary | ICD-10-CM

## 2021-05-12 DIAGNOSIS — R262 Difficulty in walking, not elsewhere classified: Secondary | ICD-10-CM

## 2021-05-12 NOTE — Therapy (Signed)
OUTPATIENT PHYSICAL THERAPY TREATMENT NOTE  Patient Name: Connie Branch MRN: [id number] DOB:05/25/1950, 71 y.o., female Today's Date: 05/12/2021  PCP: Kathyrn Lass, MD REFERRING PROVIDER: Kathyrn Lass, MD   PT End of Session - 05/12/21 0955     Visit Number 23    Number of Visits 25    Date for PT Re-Evaluation 07/04/21    Authorization Type [employer]    PT Start Time 1000    PT Stop Time 3149    PT Time Calculation (min) 40 min    Equipment Utilized During Treatment Other (comment)    Activity Tolerance Patient tolerated treatment well    Behavior During Therapy [facility] for tasks assessed/performed                   Past Medical History:  Diagnosis Date   Anxiety    panic attacks   ARF (acute renal failure) secondary to dehydration from osmotic diuresis 05/21/2013   KIDNEY FUNCTION NORMAL NOW   Arthritis    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    Cataracts, bilateral    DDD (degenerative disc disease)    DKA (diabetic ketoacidoses) 05/21/2013   TYPE 2   GERD (gastroesophageal reflux disease)    Headache    SINUS   High cholesterol    History of claustrophobia    Hypertension    Neuropathy 2015   feet   Obesity    Osteopenia    PONV (postoperative nausea and vomiting)    Past Surgical History:  Procedure Laterality Date   ABDOMINAL HYSTERECTOMY     COMPLETE   BREAST EXCISIONAL BIOPSY Left 04/20/2020   ALH   BREAST LUMPECTOMY WITH RADIOACTIVE SEED LOCALIZATION Left 01/04/2021   Procedure: LEFT BREAST LUMPECTOMY WITH RADIOACTIVE SEED LOCALIZATION X 3;  Surgeon: Jovita Kussmaul, MD;  Location: [place];  Service: General;  Laterality: Left;   BREAST SURGERY Left 04/20/2020   BX   JOINT REPLACEMENT     KNEE JOINT MANIPULATION Right    OVARIAN CYST AND OVARY REMOVED  YRS AGO   TOTAL KNEE ARTHROPLASTY Right 06/21/2017   Procedure: RIGHT TOTAL KNEE ARTHROPLASTY;  Surgeon: Dorna Leitz, MD;  Location: [facility];  Service:  Orthopedics;  Laterality: Right;   TOTAL KNEE ARTHROPLASTY Left 07/18/2020   Procedure: TOTAL KNEE ARTHROPLASTY;  Surgeon: Gaynelle Arabian, MD;  Location: [facility];  Service: Orthopedics;  Laterality: Left;  57min   TOTAL KNEE REVISION Right 07/22/2019   Procedure: Right knee polyethylene exchange;  Surgeon: Gaynelle Arabian, MD;  Location: [facility];  Service: Orthopedics;  Laterality: Right;  169min   Patient Active Problem List   Diagnosis Date Noted   Atypical lobular hyperplasia [facility]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 09/13/2020   Acute diverticulitis 08/15/2020   Hypomagnesemia 08/15/2020   Hypocalcemia 08/15/2020   Type 2 diabetes mellitus with hyperlipidemia ([place]) 08/15/2020   Anemia 08/15/2020   Failed total right knee replacement ([place]) 07/22/2019   Postoperative anemia due to acute blood loss 06/24/2017   Primary osteoarthritis of right knee 06/21/2017   Primary osteoarthritis of left knee 06/21/2017   DM (diabetes mellitus), secondary, uncontrolled, with ketoacidosis ([place]) 05/23/2013   Hypokalemia 05/22/2013   Obesity (BMI 30-39.9) 05/22/2013   DKA (diabetic ketoacidoses) 05/21/2013   Muscle spasm 05/21/2013   Hyponatremia 05/21/2013   ARF (acute renal failure) secondary to dehydration from osmotic diuresis 05/21/2013   HTN (hypertension) 05/21/2013   Hyperlipidemia 05/21/2013   GERD (gastroesophageal reflux disease) 05/21/2013   Infection due  to trichomonas 05/21/2013    REFERRING DIAG: R41.638 (ICD-10-CM) - Presence of left artificial knee joint   REFERRING PROVIDER: Hector Shade, MD  THERAPY DIAG:  Difficulty in walking, not elsewhere classified  Pain in joint of left knee  Muscle weakness (generalized)  Pain in joint of right knee   PERTINENT HISTORY: PERTINENT HISTORY: R TKA in 2019 or 2020 and revision done March 2021 in R, peripheral neuropathy, DDD, HTN   PRECAUTIONS: N/A  SUBJECTIVE: Pt states the knee feels better today. She has had decreased swelling. She does feel more  tired today due to poor sleep. She has been taking the gabapentin again to reduce the nerve pain.   PAIN:  Are you having pain? No VAS scale: 0/10  Pain location: L knee; R knee stiff PAIN TYPE: stiff/tight Pain description: intermittent     OBJECTIVE:     TODAY'S TREATMENT:   05/12/21  Nu-step 6 mins Level 6  Leg Press: shuttle  leg press 75lbs 3x10 Hip ABD 3x10 60lbs Seated knee extension machine 15lbs 3x10 Seated HS curl machine 3x10 20lbs  Step up 6" box (unable to perform on L, 10x on R) TKE Green and black TB 3x10 on L  05/10/21  Nu-step 6 mins Level 1  Tailgate knee stretch 5s 15x LAQ 4lbs 15x Obstacle navigation with pliant surface 4x laps Tandem balance on foam 30s 4x Tandem walking 57ft   12/22- LAND  Nu-step 8 mins Level 6  Lateral lunge 2x10 Lateral and fwd step up 4" box 2x10 each Fwd lunge 2x10 Minisquat UE support 3x10 STS from low table height 2x10 Stairs 4 steps reciprocal up/ step to down (down with L) 3x   12/19- LAND  Nu-step 6.5 mins Level 6  Lateral lunge 2x10 Fwd lunge 2x10 Minisquat UE support 3x10 Tandem balance 30s 3x each  STS from chair 2x10 SLS with toe touch 15s 2x  12/16- LAND  Nu-step 6.5 mins Level 6  Leg Press: shuttle  leg press 75lbs 3x10 Hip ABD 3x10 55lbs LAQ 3x10 5lbs SLS with toe touch 30s 3x Foam marching 20x  04/05/21- LAND  Nu-step 6.5 mins Level 6  Leg Press: White leg press 75lbs 5x5 STS  5x5 4lbs low table height Knee ext 3x10 15lbs Tandem balance on foam 30s 3x Foam marching 20x       PATIENT EDUCATION:  Education details:  PT progression,  anatomy, muscle firing, relaxation, envelope of function, HEP, POC  Person educated: Patient Education method: Explanation, Demonstration, Tactile cues, Verbal cues, and Handouts Education comprehension: verbalized understanding, returned demonstration, verbal cues required, tactile cues required, and needs further education     HOME EXERCISE  PROGRAM: Access Code: [email or web address] added aquatics 12/7  URL: [email or web address] Date: 02/08/2021 Prepared by: Daleen Bo       ASSESSMENT:   CLINICAL IMPRESSION: Pt able to continue with L LE strengthening at today's session. Focus on L knee extensor strength. Due to pt's recent routine, pt unable to perform gym based exercise. Pt did well with review of equipment but continues to require encouragement for increasing intensity of loading on machines when not with skilled therapy. Pt's self directed progression will be very import for her L knee to continue making progress. Plan to do balance at next session, if pt is more compliant with self gym strengthening.  Pt would benefit from continued skilled therapy in order to reach goals and maximize functional bilateral  strength and ROM for full return to PLOF.  REHAB POTENTIAL: Fair     CLINICAL DECISION MAKING: Stable/uncomplicated   EVALUATION COMPLEXITY: Low     GOALS:     SHORT TERM GOALS:   STG Name Target Date Goal status  1 Pt will become independent with HEP in order to demonstrate synthesis of PT education.   :  02/22/2021 Ongoing  2 Pt will have an at least 9 pt improvement in LEFS measure in order to demonstrate MCID improvement in daily function.   :  03/08/2021 ongoing  3 Pt will report at least 2 pt reduction on VAS scale for pain in order to demonstrate functional improvement with household activity, self care, and ADL.   : 03/08/2021 Met    LONG TERM GOALS:    LTG Name Target Date Goal status  1 Pt  will become independent with final HEP in order to demonstrate synthesis of PT education.     04/05/2021 ongoing  2 Pt will be able to demonstrate/report ability to walk >15 mins without pain in order to demonstrate functional improvement and tolerance to exercise and community mobility.   04/05/2021 ongoing  3 Pt will be able to demonstrate/report ability to sit/stand for >25 min periods of time  without pain in order to demonstrate functional improvement and tolerance to static positioning.    04/05/2021 ongoing  4 Pt will be able to demonstrate ability to walk without AD in order to demonstrate functional improvement in LE function for self-care and house hold duties.     04/05/2021 ongoing    PLAN: PT FREQUENCY: 2x/week   PT DURATION: 5 weeks   PLANNED INTERVENTIONS: Therapeutic exercises, Therapeutic activity, Neuro Muscular re-education, Balance training, Gait training, Patient/Family education, Joint mobilization, Stair training, Orthotic/Fit training, Aquatic Therapy, Dry Needling, Electrical stimulation, Spinal mobilization, Cryotherapy, Moist heat, scar mobilization, Taping, Vasopneumatic device, Traction, Ultrasound, Ionotophoresis 4mg /ml Dexamethasone, and Manual therapy   PLAN FOR NEXT SESSION:  land for balance, ROM, strength   Daleen Bo PT, DPT 05/12/21 10:47 AM

## 2021-05-15 ENCOUNTER — Ambulatory Visit (HOSPITAL_BASED_OUTPATIENT_CLINIC_OR_DEPARTMENT_OTHER): Payer: Medicare HMO | Admitting: Physical Therapy

## 2021-05-17 ENCOUNTER — Encounter (HOSPITAL_BASED_OUTPATIENT_CLINIC_OR_DEPARTMENT_OTHER): Payer: Medicare HMO | Admitting: Physical Therapy

## 2021-05-22 ENCOUNTER — Ambulatory Visit (HOSPITAL_BASED_OUTPATIENT_CLINIC_OR_DEPARTMENT_OTHER): Payer: Medicare HMO | Admitting: Physical Therapy

## 2021-05-25 ENCOUNTER — Ambulatory Visit (HOSPITAL_BASED_OUTPATIENT_CLINIC_OR_DEPARTMENT_OTHER): Payer: Medicare HMO | Admitting: Physical Therapy

## 2021-05-31 ENCOUNTER — Ambulatory Visit (HOSPITAL_BASED_OUTPATIENT_CLINIC_OR_DEPARTMENT_OTHER): Payer: Medicare HMO | Admitting: Physical Therapy

## 2021-05-31 ENCOUNTER — Encounter (HOSPITAL_BASED_OUTPATIENT_CLINIC_OR_DEPARTMENT_OTHER): Payer: Self-pay | Admitting: Physical Therapy

## 2021-05-31 ENCOUNTER — Other Ambulatory Visit: Payer: Self-pay

## 2021-05-31 DIAGNOSIS — M6281 Muscle weakness (generalized): Secondary | ICD-10-CM

## 2021-05-31 DIAGNOSIS — G8929 Other chronic pain: Secondary | ICD-10-CM

## 2021-05-31 DIAGNOSIS — R262 Difficulty in walking, not elsewhere classified: Secondary | ICD-10-CM | POA: Diagnosis not present

## 2021-05-31 DIAGNOSIS — M25561 Pain in right knee: Secondary | ICD-10-CM

## 2021-05-31 DIAGNOSIS — M25562 Pain in left knee: Secondary | ICD-10-CM

## 2021-05-31 NOTE — Therapy (Signed)
OUTPATIENT PHYSICAL THERAPY TREATMENT NOTE  Patient Name: Connie Branch MRN: [id number] DOB:03/29/1951, 71 y.o., female Today's Date: 05/31/2021  PCP: Sigmund Hazel, MD REFERRING PROVIDER: Sigmund Hazel, MD   PT End of Session - 05/31/21 1236     Visit Number 24    Number of Visits 25    Date for PT Re-Evaluation 07/04/21    Authorization Type [employer] Medicare    PT Start Time 1145    PT Stop Time 1225    PT Time Calculation (min) 40 min    Equipment Utilized During Treatment Other (comment)    Activity Tolerance Patient tolerated treatment well    Behavior During Therapy [facility] for tasks assessed/performed              Past Medical History:  Diagnosis Date   Anxiety    panic attacks   ARF (acute renal failure) secondary to dehydration from osmotic diuresis 05/21/2013   KIDNEY FUNCTION NORMAL NOW   Arthritis    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    Cataracts, bilateral    DDD (degenerative disc disease)    DKA (diabetic ketoacidoses) 05/21/2013   TYPE 2   GERD (gastroesophageal reflux disease)    Headache    SINUS   High cholesterol    History of claustrophobia    Hypertension    Neuropathy 2015   feet   Obesity    Osteopenia    PONV (postoperative nausea and vomiting)    Past Surgical History:  Procedure Laterality Date   ABDOMINAL HYSTERECTOMY     COMPLETE   BREAST EXCISIONAL BIOPSY Left 04/20/2020   ALH   BREAST LUMPECTOMY WITH RADIOACTIVE SEED LOCALIZATION Left 01/04/2021   Procedure: LEFT BREAST LUMPECTOMY WITH RADIOACTIVE SEED LOCALIZATION X 3;  Surgeon: Griselda Miner, MD;  Location: [facility];  Service: General;  Laterality: Left;   BREAST SURGERY Left 04/20/2020   BX   JOINT REPLACEMENT     KNEE JOINT MANIPULATION Right    OVARIAN CYST AND OVARY REMOVED  YRS AGO   TOTAL KNEE ARTHROPLASTY Right 06/21/2017   Procedure: RIGHT TOTAL KNEE ARTHROPLASTY;  Surgeon: Jodi Geralds, MD;  [facility];  Service: Orthopedics;   Laterality: Right;   TOTAL KNEE ARTHROPLASTY Left 07/18/2020   Procedure: TOTAL KNEE ARTHROPLASTY;  Surgeon: Ollen Gross, MD;  [facility];  Service: Orthopedics;  Laterality: Left;    TOTAL KNEE REVISION Right 07/22/2019   Procedure: Right knee polyethylene exchange;  Surgeon: Ollen Gross, MD;  Location[facility];  Service: Orthopedics;  Laterality: Right;    Patient Active Problem List   Diagnosis Date Noted   Atypical lobular hyperplasia [facility]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 09/13/2020   Acute diverticulitis 08/15/2020   Hypomagnesemia 08/15/2020   Hypocalcemia 08/15/2020   Type 2 diabetes mellitus with hyperlipidemia ([facility]) 08/15/2020   Anemia 08/15/2020   Failed total right knee replacement ([facility]) 07/22/2019   Postoperative anemia due to acute blood loss 06/24/2017   Primary osteoarthritis of right knee 06/21/2017   Primary osteoarthritis of left knee 06/21/2017   DM (diabetes mellitus), secondary, uncontrolled, with ketoacidosis ([facility]) 05/23/2013   Hypokalemia 05/22/2013   Obesity (BMI 30-39.9) 05/22/2013   DKA (diabetic ketoacidoses) 05/21/2013   Muscle spasm 05/21/2013   Hyponatremia 05/21/2013   ARF (acute renal failure) secondary to dehydration from osmotic diuresis 05/21/2013   HTN (hypertension) 05/21/2013   Hyperlipidemia 05/21/2013   GERD (gastroesophageal reflux disease) 05/21/2013   Infection due to trichomonas 05/21/2013  REFERRING DIAG: Z61.096 (ICD-10-CM) - Presence of left artificial knee joint   REFERRING PROVIDER: Hector Shade, MD  THERAPY DIAG:  Difficulty in walking, not elsewhere classified  Pain in joint of left knee  Muscle weakness (generalized)  Pain in joint of right knee  Chronic pain of right knee   PERTINENT HISTORY: PERTINENT HISTORY: R TKA in 2019 or 2020 and revision done March 2021 in R, peripheral neuropathy, DDD, HTN   PRECAUTIONS: N/A  SUBJECTIVE: Pt states the knee does not have issues today. While she was in [place], she  was able to walk some without the walker but needed it on [place] walkways.    PAIN:  Are you having pain? No VAS scale: 0/10  Pain location: L knee; R knee stiff PAIN TYPE: stiff/tight Pain description: intermittent     OBJECTIVE:     TODAY'S TREATMENT:  05/31/21  Nu-step 6 mins Level 6  Leg Press: shuttle  leg press 75lbs 2x10 with DL support, 2x10 with single leg 25lb cord  Obstacle navigation with pliant surface/step ups/ step downs/ turning 12x laps Sidestepping in quarter squat position 4x laps around silver bars    05/12/21  Nu-step 6 mins Level 6  Leg Press: shuttle  leg press 75lbs 3x10 Hip ABD 3x10 60lbs Seated knee extension machine 15lbs 3x10 Seated HS curl machine 3x10 20lbs  Step up 6" box (unable to perform on L, 10x on R) TKE Green and black TB 3x10 on L  05/10/21  Nu-step 6 mins Level 1  Tailgate knee stretch 5s 15x LAQ 4lbs 15x Obstacle navigation with pliant surface 4x laps Tandem balance on foam 30s 4x Tandem walking 90ft   12/22- LAND  Nu-step 8 mins Level 6  Lateral lunge 2x10 Lateral and fwd step up 4" box 2x10 each Fwd lunge 2x10 Minisquat UE support 3x10 STS from low table height 2x10 Stairs 4 steps reciprocal up/ step to down (down with L) 3x   12/19- LAND  Nu-step 6.5 mins Level 6  Lateral lunge 2x10 Fwd lunge 2x10 Minisquat UE support 3x10 Tandem balance 30s 3x each  STS from chair 2x10 SLS with toe touch 15s 2x  12/16- LAND  Nu-step 6.5 mins Level 6  Leg Press: shuttle  leg press 75lbs 3x10 Hip ABD 3x10 55lbs LAQ 3x10 5lbs SLS with toe touch 30s 3x Foam marching 20x  04/05/21- LAND  Nu-step 6.5 mins Level 6  Leg Press: White leg press 75lbs 5x5 STS  5x5 4lbs low table height Knee ext 3x10 15lbs Tandem balance on foam 30s 3x Foam marching 20x       PATIENT EDUCATION:  Education details:  PT progression,  anatomy, muscle firing, relaxation, envelope of function, HEP, POC  Person educated:  Patient Education method: Explanation, Demonstration, Tactile cues, Verbal cues, and Handouts Education comprehension: verbalized understanding, returned demonstration, verbal cues required, tactile cues required, and needs further education     HOME EXERCISE PROGRAM: Access Code: [email or web address] added aquatics 12/7  URL: [email or web address] Date: 02/08/2021 Prepared by: Daleen Bo       ASSESSMENT:   CLINICAL IMPRESSION: Pt able to continue with balance based exercise at today's session with focus on reducing hyperextension and stiff legged gait with dynamic mobility. Pt has tendency to hyperextend bilateral knees during weight acceptance phase likely due to quad weakness and fear inhibition. When given TC and VC pt was able to maintain athletic stance and slight knee flexion for proper transition during midstance and swing through phases. Plan to  D/C at next visit as pt is nearing functional plateau with skilled therapy and likely able to continue with self strength progression when provided appropriate edu and cuing. Pt would benefit from continued skilled therapy in order to reach goals and maximize functional bilateral  strength and ROM for full return to PLOF.      REHAB POTENTIAL: Fair     CLINICAL DECISION MAKING: Stable/uncomplicated   EVALUATION COMPLEXITY: Low     GOALS:     SHORT TERM GOALS:   STG Name Target Date Goal status  1 Pt will become independent with HEP in order to demonstrate synthesis of PT education.   :  02/22/2021 Partially met  2 Pt will have an at least 9 pt improvement in LEFS measure in order to demonstrate MCID improvement in daily function.   :  03/08/2021 ongoing  3 Pt will report at least 2 pt reduction on VAS scale for pain in order to demonstrate functional improvement with household activity, self care, and ADL.   : 03/08/2021 Met    LONG TERM GOALS:    LTG Name Target Date Goal status  1 Pt  will become independent with final  HEP in order to demonstrate synthesis of PT education.     04/05/2021 ongoing  2 Pt will be able to demonstrate/report ability to walk >15 mins without pain in order to demonstrate functional improvement and tolerance to exercise and community mobility.   04/05/2021 Partially met  3 Pt will be able to demonstrate/report ability to sit/stand for >25 min periods of time without pain in order to demonstrate functional improvement and tolerance to static positioning.    04/05/2021 met  4 Pt will be able to demonstrate ability to walk without AD in order to demonstrate functional improvement in LE function for self-care and house hold duties.     04/05/2021 met    PLAN: PT FREQUENCY: 2x/week   PT DURATION: 5 weeks   PLANNED INTERVENTIONS: Therapeutic exercises, Therapeutic activity, Neuro Muscular re-education, Balance training, Gait training, Patient/Family education, Joint mobilization, Stair training, Orthotic/Fit training, Aquatic Therapy, Dry Needling, Electrical stimulation, Spinal mobilization, Cryotherapy, Moist heat, scar mobilization, Taping, Vasopneumatic device, Traction, Ultrasound, Ionotophoresis 4mg /ml Dexamethasone, and Manual therapy   PLAN FOR NEXT SESSION:  land for balance, ROM, strength   Daleen Bo PT, DPT 05/31/21 12:46 PM

## 2021-06-02 ENCOUNTER — Other Ambulatory Visit: Payer: Self-pay

## 2021-06-02 ENCOUNTER — Ambulatory Visit (HOSPITAL_BASED_OUTPATIENT_CLINIC_OR_DEPARTMENT_OTHER): Payer: Medicare HMO | Admitting: Physical Therapy

## 2021-06-02 ENCOUNTER — Encounter (HOSPITAL_BASED_OUTPATIENT_CLINIC_OR_DEPARTMENT_OTHER): Payer: Self-pay | Admitting: Physical Therapy

## 2021-06-02 DIAGNOSIS — M25562 Pain in left knee: Secondary | ICD-10-CM

## 2021-06-02 DIAGNOSIS — M6281 Muscle weakness (generalized): Secondary | ICD-10-CM

## 2021-06-02 DIAGNOSIS — M25561 Pain in right knee: Secondary | ICD-10-CM

## 2021-06-02 DIAGNOSIS — G8929 Other chronic pain: Secondary | ICD-10-CM

## 2021-06-02 DIAGNOSIS — R262 Difficulty in walking, not elsewhere classified: Secondary | ICD-10-CM | POA: Diagnosis not present

## 2021-06-02 NOTE — Therapy (Signed)
OUTPATIENT PHYSICAL THERAPY TREATMENT NOTE  Patient Name: Connie Branch MRN: [id number] DOB:[birth year], 71 y.o., female Today's Date: 06/02/2021  PCP: Kathyrn Lass, MD REFERRING PROVIDER: Kathyrn Lass, MD   PT End of Session - 06/02/21 1142     Visit Number 25    Number of Visits 25    Date for PT Re-Evaluation 07/04/21    Authorization Type [employer]    PT Start Time 1100    PT Stop Time 1145    PT Time Calculation (min) 45 min    Equipment Utilized During Treatment Other (comment)    Activity Tolerance Patient tolerated treatment well    Behavior During Therapy [facility] for tasks assessed/performed               Past Medical History:  Diagnosis Date   Anxiety    panic attacks   ARF (acute renal failure) secondary to dehydration from osmotic diuresis 05/21/2013   KIDNEY FUNCTION NORMAL NOW   Arthritis    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    Cataracts, bilateral    DDD (degenerative disc disease)    DKA (diabetic ketoacidoses) 05/21/2013   TYPE 2   GERD (gastroesophageal reflux disease)    Headache    SINUS   High cholesterol    History of claustrophobia    Hypertension    Neuropathy 2015   feet   Obesity    Osteopenia    PONV (postoperative nausea and vomiting)    Past Surgical History:  Procedure Laterality Date   ABDOMINAL HYSTERECTOMY     COMPLETE   BREAST EXCISIONAL BIOPSY Left 04/20/2020   ALH   BREAST LUMPECTOMY WITH RADIOACTIVE SEED LOCALIZATION Left 01/04/2021   Procedure: LEFT BREAST LUMPECTOMY WITH RADIOACTIVE SEED LOCALIZATION X 3;  Surgeon: Jovita Kussmaul, MD;  Location: [place];  Service: General;  Laterality: Left;   BREAST SURGERY Left 04/20/2020   BX   JOINT REPLACEMENT     KNEE JOINT MANIPULATION Right    OVARIAN CYST AND OVARY REMOVED  YRS AGO   TOTAL KNEE ARTHROPLASTY Right 06/21/2017   Procedure: RIGHT TOTAL KNEE ARTHROPLASTY;  Surgeon: Dorna Leitz, MD;  Location: [facility];  Service:  Orthopedics;  Laterality: Right;   TOTAL KNEE ARTHROPLASTY Left 07/18/2020   Procedure: TOTAL KNEE ARTHROPLASTY;  Surgeon: Gaynelle Arabian, MD;  Location: [facility];  Service: Orthopedics;  Laterality: Left;  82min   TOTAL KNEE REVISION Right 07/22/2019   Procedure: Right knee polyethylene exchange;  Surgeon: Gaynelle Arabian, MD;  Location: [facility];  Service: Orthopedics;  Laterality: Right;  155min   Patient Active Problem List   Diagnosis Date Noted   Atypical lobular hyperplasia [facility]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 09/13/2020   Acute diverticulitis 08/15/2020   Hypomagnesemia 08/15/2020   Hypocalcemia 08/15/2020   Type 2 diabetes mellitus with hyperlipidemia ([place]) 08/15/2020   Anemia 08/15/2020   Failed total right knee replacement ([place]) 07/22/2019   Postoperative anemia due to acute blood loss 06/24/2017   Primary osteoarthritis of right knee 06/21/2017   Primary osteoarthritis of left knee 06/21/2017   DM (diabetes mellitus), secondary, uncontrolled, with ketoacidosis ([place]) 05/23/2013   Hypokalemia 05/22/2013   Obesity (BMI 30-39.9) 05/22/2013   DKA (diabetic ketoacidoses) 05/21/2013   Muscle spasm 05/21/2013   Hyponatremia 05/21/2013   ARF (acute renal failure) secondary to dehydration from osmotic diuresis 05/21/2013   HTN (hypertension) 05/21/2013   Hyperlipidemia 05/21/2013   GERD (gastroesophageal reflux disease) 05/21/2013   Infection due to trichomonas 05/21/2013  REFERRING DIAG: R11.657 (ICD-10-CM) - Presence of left artificial knee joint   REFERRING PROVIDER: Hector Shade, MD  THERAPY DIAG:  Difficulty in walking, not elsewhere classified  Pain in joint of left knee  Muscle weakness (generalized)  Pain in joint of right knee  Chronic pain of right knee   PERTINENT HISTORY: PERTINENT HISTORY: R TKA in 2019 or 2020 and revision done March 2021 in R, peripheral neuropathy, DDD, HTN   PRECAUTIONS: N/A  SUBJECTIVE: Pt states the knee does not have issues today.    PAIN:   Are you having pain? No VAS scale: 0/10  Pain location: L knee; R knee stiff PAIN TYPE: stiff/tight Pain description: intermittent     OBJECTIVE:   5XSTS 14.1s      LE AROM/PROM:   A/PROM Right 02/08/2021 Left 02/08/2021  Knee flexion 121 117  Knee extension 3 3   (Blank rows = not tested)   LE MMT:   MMT Right 02/08/2021 Left 02/08/2021  Hip extension 4+/5 4+/5  Hip abduction 4+/5 4+/5  Knee flexion 4+/5 4+/5  Knee extension 5/5 4+/5   (Blank rows = not tested)  L knee MMT 4+/5 flex and ext  R knee MMT 5/5 flex and ext  LEFS: 41/80 points (or 51.25%)    Gait: level surface gait without AD; able to perform reciprocal stair stepping 14 steps with single UE support  TODAY'S TREATMENT:  06/02/21  Nu-step 8 mins Level 6  Sidestepping in quarter squat position 4x laps around silver bars   Full HEP review and in depth review about safety at the gym- pool, land, and machine based exercises provided   05/31/21  Nu-step 6 mins Level 6  Leg Press: shuttle  leg press 75lbs 2x10 with DL support, 2x10 with single leg 25lb cord  Obstacle navigation with pliant surface/step ups/ step downs/ turning 12x laps Sidestepping in quarter squat position 4x laps around silver bars   05/12/21  Nu-step 6 mins Level 6  Leg Press: shuttle  leg press 75lbs 3x10 Hip ABD 3x10 60lbs Seated knee extension machine 15lbs 3x10 Seated HS curl machine 3x10 20lbs  Step up 6" box (unable to perform on L, 10x on R) TKE Green and black TB 3x10 on L  05/10/21  Nu-step 6 mins Level 1  Tailgate knee stretch 5s 15x LAQ 4lbs 15x Obstacle navigation with pliant surface 4x laps Tandem balance on foam 30s 4x Tandem walking 40ft   12/22- LAND  Nu-step 8 mins Level 6  Lateral lunge 2x10 Lateral and fwd step up 4" box 2x10 each Fwd lunge 2x10 Minisquat UE support 3x10 STS from low table height 2x10 Stairs 4 steps reciprocal up/ step to down (down with L) 3x   12/19- LAND  Nu-step 6.5  mins Level 6  Lateral lunge 2x10 Fwd lunge 2x10 Minisquat UE support 3x10 Tandem balance 30s 3x each  STS from chair 2x10 SLS with toe touch 15s 2x  12/16- LAND  Nu-step 6.5 mins Level 6  Leg Press: shuttle  leg press 75lbs 3x10 Hip ABD 3x10 55lbs LAQ 3x10 5lbs SLS with toe touch 30s 3x Foam marching 20x  04/05/21- LAND  Nu-step 6.5 mins Level 6  Leg Press: White leg press 75lbs 5x5 STS  5x5 4lbs low table height Knee ext 3x10 15lbs Tandem balance on foam 30s 3x Foam marching 20x       PATIENT EDUCATION:  Education details:  PT progression,  anatomy, muscle firing, relaxation, envelope of function, HEP, POC  Person educated: Patient Education method: Explanation, Demonstration, Tactile cues, Verbal cues, and Handouts Education comprehension: verbalized understanding, returned demonstration, verbal cues required, tactile cues required, and needs further education     HOME EXERCISE PROGRAM: Access Code: [email or web address] added aquatics 12/7  URL: [email or web address] Date: 02/08/2021 Prepared by: Daleen Bo       ASSESSMENT:   CLINICAL IMPRESSION: Pt does demonstrate functional improvement with mobility and stair management at this time and able to more confidently navigate level surfaces without use of AD. Pt does demonstrate functional plateau with L knee ROM which is likely a longer lasting characteristic given date of surgery. Pt is more comfortable with reducing flexed knee gait and able to independnetly progress strength within the gym as she has a [employer]. Pt provided aquatic exericse and land based exercise. Pt has maximized current episode of PT. D/C this episode at this time.     REHAB POTENTIAL: Fair     CLINICAL DECISION MAKING: Stable/uncomplicated   EVALUATION COMPLEXITY: Low     GOALS:     SHORT TERM GOALS:   STG Name Target Date Goal status  1 Pt will become independent with HEP in order to demonstrate synthesis of PT  education.   :  02/22/2021 MET met  2 Pt will have an at least 9 pt improvement in LEFS measure in order to demonstrate MCID improvement in daily function.   :  03/08/2021 Partially met  3 Pt will report at least 2 pt reduction on VAS scale for pain in order to demonstrate functional improvement with household activity, self care, and ADL.   : 03/08/2021 Met    LONG TERM GOALS:    LTG Name Target Date Goal status  1 Pt  will become independent with final HEP in order to demonstrate synthesis of PT education.     04/05/2021 MET  2 Pt will be able to demonstrate/report ability to walk >15 mins without pain in order to demonstrate functional improvement and tolerance to exercise and community mobility.   04/05/2021 Partially met   3 Pt will be able to demonstrate/report ability to sit/stand for >25 min periods of time without pain in order to demonstrate functional improvement and tolerance to static positioning.    04/05/2021 met  4 Pt will be able to demonstrate ability to walk without AD in order to demonstrate functional improvement in LE function for self-care and house hold duties.     04/05/2021 met    PLAN: PT FREQUENCY: 2x/week   PT DURATION: 5 weeks   PLANNED INTERVENTIONS: Therapeutic exercises, Therapeutic activity, Neuro Muscular re-education, Balance training, Gait training, Patient/Family education, Joint mobilization, Stair training, Orthotic/Fit training, Aquatic Therapy, Dry Needling, Electrical stimulation, Spinal mobilization, Cryotherapy, Moist heat, scar mobilization, Taping, Vasopneumatic device, Traction, Ultrasound, Ionotophoresis 4mg /ml Dexamethasone, and Manual therapy   PLAN FOR NEXT SESSION:  land for balance, ROM, strength   Daleen Bo PT, DPT 06/02/21 12:00 PM    PHYSICAL THERAPY DISCHARGE SUMMARY  Visits from Start of Care: 24   Plan: Patient agrees to discharge.  Patient goals were mostly met. Patient is being discharged due to maximizing current  episode of PT function

## 2021-07-19 ENCOUNTER — Ambulatory Visit: Payer: Medicare HMO | Admitting: Hematology and Oncology

## 2021-07-24 ENCOUNTER — Other Ambulatory Visit: Payer: Self-pay

## 2021-07-24 ENCOUNTER — Inpatient Hospital Stay: Payer: Medicare HMO | Attending: Hematology and Oncology | Admitting: Hematology and Oncology

## 2021-07-24 ENCOUNTER — Encounter: Payer: Self-pay | Admitting: Hematology and Oncology

## 2021-07-24 VITALS — BP 141/82 | HR 78 | Temp 97.0°F | Resp 16 | Ht 63.0 in | Wt 151.4 lb

## 2021-07-24 DIAGNOSIS — E1142 Type 2 diabetes mellitus with diabetic polyneuropathy: Secondary | ICD-10-CM | POA: Diagnosis not present

## 2021-07-24 DIAGNOSIS — Z7984 Long term (current) use of oral hypoglycemic drugs: Secondary | ICD-10-CM | POA: Diagnosis not present

## 2021-07-24 DIAGNOSIS — N6092 Unspecified benign mammary dysplasia of left breast: Secondary | ICD-10-CM | POA: Diagnosis not present

## 2021-07-24 DIAGNOSIS — I10 Essential (primary) hypertension: Secondary | ICD-10-CM | POA: Diagnosis not present

## 2021-07-24 DIAGNOSIS — Z79899 Other long term (current) drug therapy: Secondary | ICD-10-CM | POA: Insufficient documentation

## 2021-07-24 DIAGNOSIS — Z87891 Personal history of nicotine dependence: Secondary | ICD-10-CM | POA: Insufficient documentation

## 2021-07-24 NOTE — Progress Notes (Signed)
Connie Branch ?FOLLOW UP NOTE ? ?Patient Care Team: ?Kathyrn Lass, MD as PCP - General (Family Medicine) ? ?CHIEF COMPLAINTS/PURPOSE OF CONSULTATION:  ?Follow up for [facility] ? ?ASSESSMENT & PLAN:  ? ?This is a 71 year old female patient with previous diagnosis of atypical lobular hyperplasia status post lumpectomy of the left breast, denied adjuvant endocrine therapy with antiestrogens who is here for follow-up.  Since her last visit, she has noted that the left breast scar hurts from time to time and she has already spoken to the surgeon about this.  She denies any other new complaints.   ?Physical examination today unremarkable, no palpable lymphadenopathy, breast exam normal.  Her last mammogram in December with no concerning findings.  I have advised to repeat mammogram once a year and return to clinic for follow-up in 6 months.  She is considering moving to [place] next year to be closer to family.  She was encouraged to establish with breast clinic there for follow-up. ? ?HISTORY OF PRESENTING ILLNESS:  ? ?Connie Branch 71 y.o. female is here because of atypical lobular hyperplasia. ? ?Oncology History  ? No history exists.  ? ? ?Ms. Edge is a very pleasant 71 year old female patient with past medical history significant for diabetes, peripheral neuropathy, osteoarthritis, hypertension referred to high-risk breast cancer clinic given her recent biopsy of left breast which demonstrated atypical lobular hyperplasia. During her last visit she became very anxious when we discussed anti estrogen therapy. ? ?INTERIM HISTORY ?She says she has been doing okay except for some problems.  She complains of some back pain radiating to the legs as well as some knee pains.  She has also noticed that her scar in the left breast continues to burn and has sharp shooting pains in that breast from time to time.  She denies any palpable masses.   ? ?Rest of the pertinent 10 point ROS reviewed and  negative. ? ?MEDICAL HISTORY:  ?Past Medical History:  ?Diagnosis Date  ? Anxiety   ? panic attacks  ? ARF (acute renal failure) secondary to dehydration from osmotic diuresis 05/21/2013  ? KIDNEY FUNCTION NORMAL NOW  ? Arthritis   ? 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 Cataracts, bilateral   ? DDD (degenerative disc disease)   ? DKA (diabetic ketoacidoses) 05/21/2013  ? TYPE 2  ? GERD (gastroesophageal reflux disease)   ? Headache   ? SINUS  ? High cholesterol   ? History of claustrophobia   ? Hypertension   ? Neuropathy 2015  ? feet  ? Obesity   ? Osteopenia   ? PONV (postoperative nausea and vomiting)   ? ? ?SURGICAL HISTORY: ?Past Surgical History:  ?Procedure Laterality Date  ? ABDOMINAL HYSTERECTOMY    ? COMPLETE  ? BREAST EXCISIONAL BIOPSY Left 04/20/2020  ? [place]  ? BREAST LUMPECTOMY WITH RADIOACTIVE SEED LOCALIZATION Left 01/04/2021  ? Procedure: LEFT BREAST LUMPECTOMY WITH RADIOACTIVE SEED LOCALIZATION X 3;  Surgeon: Jovita Kussmaul, MD;  Location: [place];  Service: General;  Laterality: Left;  ? BREAST SURGERY Left 04/20/2020  ? BX  ? JOINT REPLACEMENT    ? KNEE JOINT MANIPULATION Right   ? OVARIAN CYST AND OVARY REMOVED  YRS AGO  ? TOTAL KNEE ARTHROPLASTY Right 06/21/2017  ? Procedure: RIGHT TOTAL KNEE ARTHROPLASTY;  Surgeon: Dorna Leitz, MD;  Location[facility];  Service: Orthopedics;  Laterality: Right;  ? TOTAL KNEE ARTHROPLASTY Left 07/18/2020  ? Procedure: TOTAL KNEE ARTHROPLASTY;  Surgeon: Gaynelle Arabian, MD;  Location: [facility];  Service: Orthopedics;  Laterality: Left;  74mn  ? TOTAL KNEE REVISION Right 07/22/2019  ? Procedure: Right knee polyethylene exchange;  Surgeon: AGaynelle Arabian MD;  Location[facility];  Service: Orthopedics;  Laterality: Right;  1267m  ? ? ?SOCIAL HISTORY: ?Social History  ? ?Socioeconomic History  ? Marital status: Single  ?  Spouse name: Not on file  ? Number of children: 0  ? Years of education: Not on file  ? Highest education level: Not  on file  ?Occupational History  ? Occupation: Disabled.  ?Tobacco Use  ? Smoking status: Former  ?  Packs/day: 0.25  ?  Years: 50.00  ?  Pack years: 12.50  ?  Types: Cigarettes  ?  Quit date: 05/07/2013  ?  Years since quitting: 8.2  ? Smokeless tobacco: Never  ?Vaping Use  ? Vaping Use: Never used  ?Substance and Sexual Activity  ? Alcohol use: Not Currently  ?  Comment: rare  ? Drug use: No  ? Sexual activity: Not Currently  ?  Birth control/protection: Surgical  ?Other Topics Concern  ? Not on file  ?Social History Narrative  ? Single.  Lives with family.  ? ?Social Determinants of Health  ? ?Financial Resource Strain: Not on file  ?Food Insecurity: Not on file  ?Transportation Needs: Not on file  ?Physical Activity: Not on file  ?Stress: Not on file  ?Social Connections: Not on file  ?Intimate Partner Violence: Not on file  ? ? ?FAMILY HISTORY: ?Family History  ?Problem Relation Age of Onset  ? Breast cancer Neg Hx   ? ? ?ALLERGIES:  is allergic to erythromycin, aspirin, and penicillins. ? ?MEDICATIONS:  ?Current Outpatient Medications  ?Medication Sig Dispense Refill  ? acetaminophen (TYLENOL) 500 MG tablet Take 1,000 mg by mouth every 6 (six) hours as needed for moderate pain.    ? Ascorbic Acid (VITAMIN C) 1000 MG tablet Take 1,000 mg by mouth daily.    ? Blood Glucose Monitoring Suppl (BLOOD GLUCOSE METER) kit Use as instructed 1 each 0  ? gabapentin (NEURONTIN) 300 MG capsule Take 300 mg by mouth 2 (two) times daily.    ? hydrochlorothiazide (HYDRODIURIL) 25 MG tablet Take 1 tablet (25 mg total) by mouth daily.    ? KLOR-CON M20 20 MEQ tablet Take 40 mEq by mouth daily.    ? losartan (COZAAR) 100 MG tablet Take 100 mg by mouth daily.    ? metFORMIN (GLUCOPHAGE) 500 MG tablet Take 500 mg by mouth 2 (two) times daily with a meal.  0  ? methocarbamol (ROBAXIN) 500 MG tablet Take 1 tablet (500 mg total) by mouth every 6 (six) hours as needed for muscle spasms. (Patient not taking: Reported on 02/08/2021) 40 tablet  0  ? omeprazole (PRILOSEC) 40 MG capsule Take 40 mg by mouth daily.  1  ? simvastatin (ZOCOR) 40 MG tablet Take 40 mg by mouth daily.  3  ? traMADol (ULTRAM) 50 MG tablet Take 1 tablet (50 mg total) by mouth every 6 (six) hours as needed. 20 tablet 0  ? ?No current facility-administered medications for this visit.  ? ? ?PHYSICAL EXAMINATION: ? ?ECOG PERFORMANCE STATUS: 0 - Asymptomatic ? ?Vitals:  ? 07/24/21 1148  ?BP: (!) 141/82  ?Pulse: 78  ?Resp: 16  ?Temp: (!) 97 ?F (36.1 ?C)  ?SpO2: 100%  ? ? ?GENERAL:alert, no distress and comfortable ?Breast: Left breast status postlumpectomy.  No palpable breast masses or  regional adenopathy ? ?LABORATORY DATA:  ?I have reviewed the data as listed ?Lab Results  ?Component Value Date  ? WBC 4.6 11/10/2020  ? HGB 10.9 (L) 11/10/2020  ? HCT 34.0 (L) 11/10/2020  ? MCV 91.9 11/10/2020  ? PLT 121 (L) 11/10/2020  ? ?  Chemistry   ?   ?Component Value Date/Time  ? NA 140 01/02/2021 0830  ? K 3.6 01/02/2021 0830  ? CL 106 01/02/2021 0830  ? CO2 26 01/02/2021 0830  ? BUN 14 01/02/2021 0830  ? CREATININE 0.73 01/02/2021 0830  ?    ?Component Value Date/Time  ? CALCIUM 9.3 01/02/2021 0830  ? ALKPHOS 78 08/20/2020 1332  ? AST 16 08/20/2020 1332  ? ALT 11 08/20/2020 1332  ? BILITOT 0.7 08/20/2020 1332  ?  ? ?FINAL MICROSCOPIC DIAGNOSIS:  ? ?A. BREAST, LEFT UPPER OUTER, LUMPECTOMY:  ?-  Lobular neoplasia (atypical lobular hyperplasia)  ?-  Adenosis and fibroadenomatoid changes  ?-  Periductular chronic inflammation  ?-  Previous biopsy site changes  ?-  No malignancy identified  ?-  See comment  ? ?B. BREAST, LEFT MEDIAL, LUMPECTOMY:  ?-  Lobular neoplasia (atypical lobular hyperplasia)  ?-  No malignancy identified  ? ?C. BREAST, LEFT ADDITIONAL DEEP MARGIN, EXCISION:  ?-  Lobular neoplasia (atypical lobular hyperplasia)  ?-  Fibroadenomatoid and fibrocystic changes with calcifications  ?-  No malignancy identified  ?-  See comment  ? ?RADIOGRAPHIC STUDIES: ?I have personally reviewed the  radiological images as listed and agreed with the findings in the report. ?No results found. ? ? ?All questions were answered. The patient knows to call the clinic with any problems, questions or concerns. ?I spent 20

## 2021-08-09 IMAGING — MG MM DIGITAL DIAGNOSTIC UNILAT*L* W/ TOMO W/ CAD
4 series · 4 of 12 positions shown · non-contrast
Comparison: Previous exam(s).
COMPARISON: Previous exam(s).
COMPARISON: Previous exam(s).

Addendum:
CLINICAL DATA: The patient was called back from screening
mammography March 10, 2020 for a possible asymmetry in the right
breast. Diagnostic mammography was performed April 08, 2020
demonstrating suspicious architectural distortion involving a
significant portion of the inner left breast centered at 9 o'clock
with some extension across midline in the lateral left breast
spanning 7 cm. The medial and lateral aspects of the distortion were
biopsied demonstrating ALH at both sites. Bilateral breast MRI was
recommended but not performed due to the patient's claustrophobia. A
biopsy of the central portion of the distortion was recommended in
the absence of MRI but apparently not performed. Two additional
masses were seen, 1 on mammography and ultrasound and the other on
only ultrasound. A six-month follow-up was recommended of these
masses.

EXAM:
DIGITAL DIAGNOSTIC UNILATERAL LEFT MAMMOGRAM WITH TOMOSYNTHESIS AND
CAD
TECHNIQUE: Left digital diagnostic mammography and breast tomosynthesis was
performed. The images were evaluated with computer-aided detection.

[L MLO synth-2D]
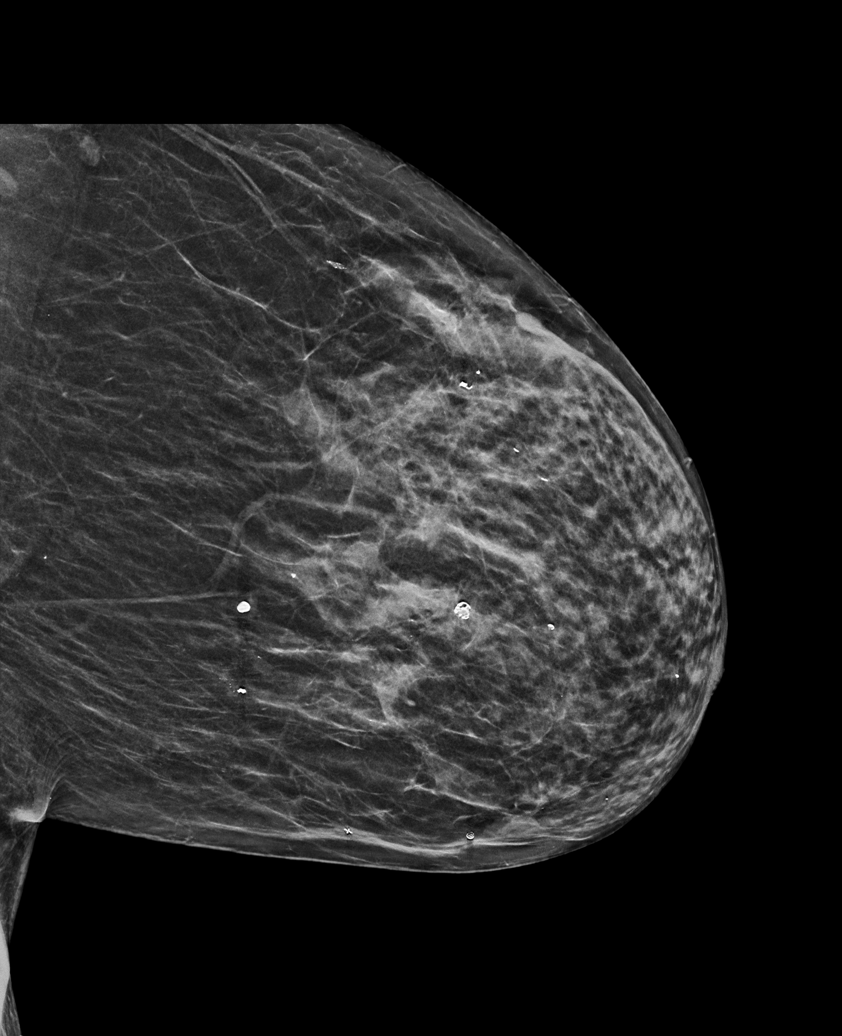

[L CC synth-2D]
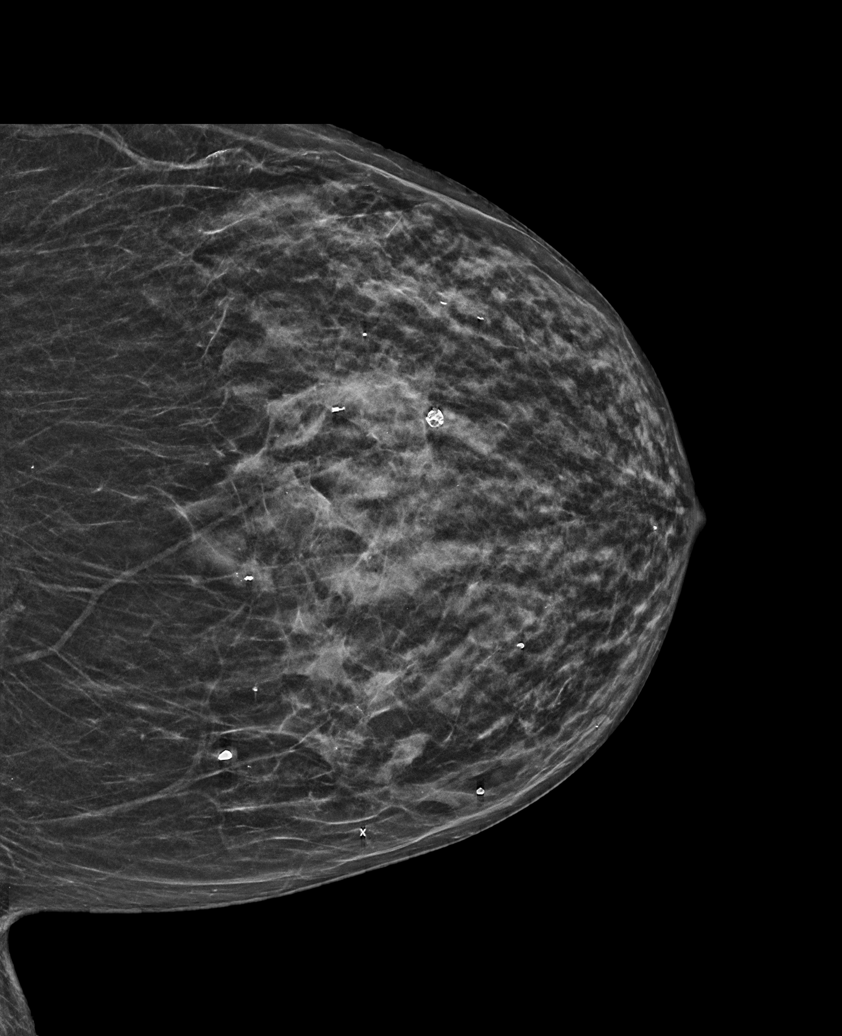

[L MLO tomo · tomo slice 27/52.0]
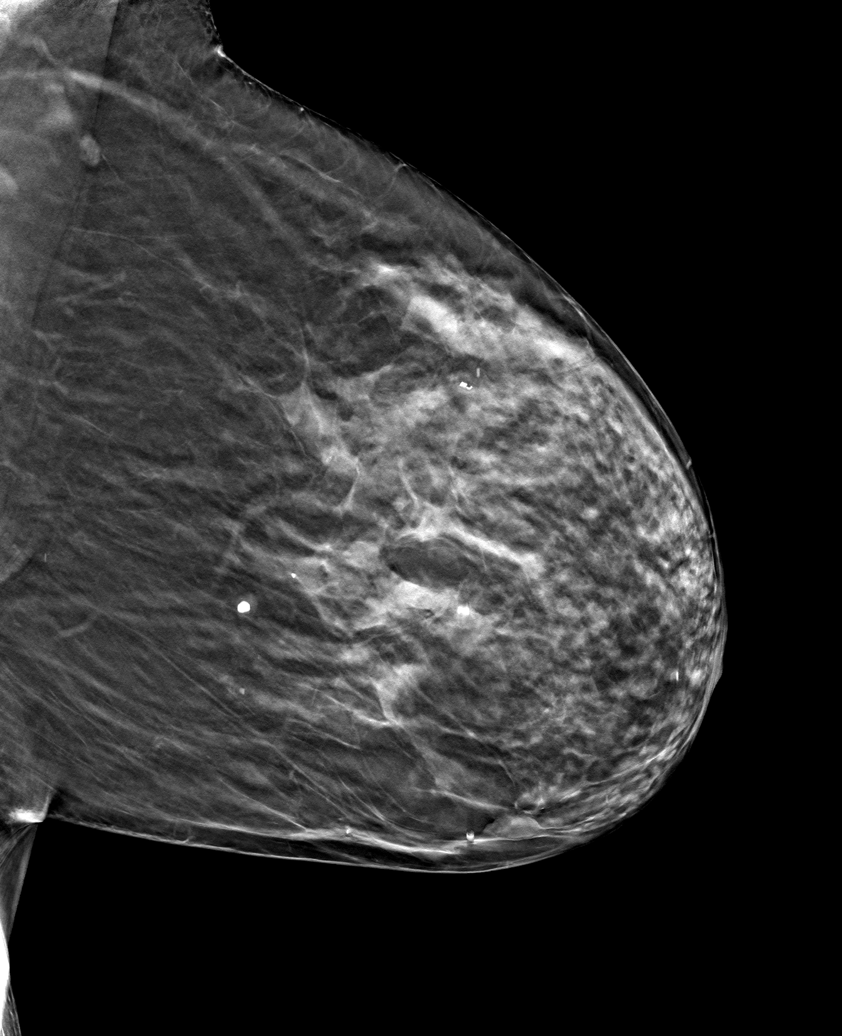

[L CC tomo · tomo slice 23/46.0]
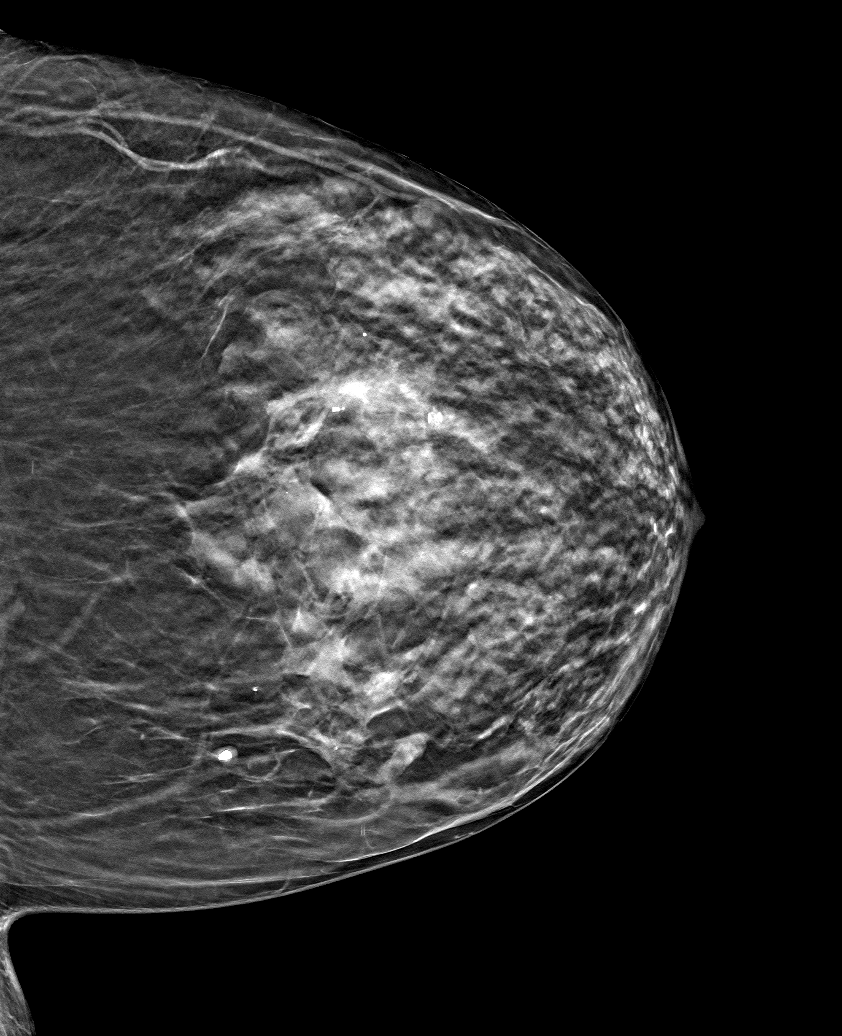

[4 of 12 positions shown; findings below may reference images not displayed]

ACR Breast Density Category c: The breast tissue is heterogeneously
dense, which may obscure small masses.
FINDINGS: The large region of asymmetry/distortion in the left breast
persists, measuring up to 10 cm by my measurement, not significantly
changed. A coil shaped clip marks the lateral-most biopsy extent and
an X shaped clip marks the medial biopsy site. The mass in the
inferior left breast on the MLO view only is stable
mammographically. The other mass has never been visualized
mammographically. No other changes on the left.

Ultrasound of the left breast masses was recommended but the patient
was unable to stay and left the department before the ultrasound
could be performed.
IMPRESSION: There is a large asymmetry/distortion in the left breast spanning up
to 10 cm by my measurement today, unchanged in the interval by my
measurement. Only the lateral and medial most extent of the
distortion/asymmetry has been biopsied. The patient was unable to
receive breast MRI due to claustrophobia. A biopsy in the central
region of asymmetry distortion has been recommended but not
performed. The inferiorly located mass seen on the MLO view is
stable mammographically. The other mass seen with ultrasound has
never been seen with mammography. However, ultrasound could not be
performed today as the patient left the department.

RECOMMENDATION:
Recommend ultrasound of the left breast to ensure stability of the
previously identified probably benign masses. Recommend MRI if the
patient can tolerate. If the patient cannot tolerate MRI, recommend
biopsying the center of the asymmetry/distortion prior to surgery.
Recommend continued surgical consultation.

I have discussed the findings and recommendations with the patient.
If applicable, a reminder letter will be sent to the patient
regarding the next appointment.

BI-RADS CATEGORY  0: Incomplete. Need additional imaging evaluation
and/or prior mammograms for comparison.

ADDENDUM:
The called back from screening mammography in Saturday March, 2020 was
on the left, not the right.

ADDENDUM:
Review of prior images and recommendations was performed prior to
scheduled radioactive seed localization on 11/18/2020.

The prior recommendation for additional biopsy of the central
portion of the LEFT breast asymmetry/distortion was discussed with
Dr. Eye. We both agreed to postpone the radioactive seed
localization and schedule the patient for this additional biopsy.

*** End of Addendum ***
Addendum:
ACR Breast Density Category c: The breast tissue is heterogeneously
dense, which may obscure small masses.
FINDINGS: The large region of asymmetry/distortion in the left breast
persists, measuring up to 10 cm by my measurement, not significantly
changed. A coil shaped clip marks the lateral-most biopsy extent and
an X shaped clip marks the medial biopsy site. The mass in the
inferior left breast on the MLO view only is stable
mammographically. The other mass has never been visualized
mammographically. No other changes on the left.

Ultrasound of the left breast masses was recommended but the patient
was unable to stay and left the department before the ultrasound
could be performed.
IMPRESSION: There is a large asymmetry/distortion in the left breast spanning up
to 10 cm by my measurement today, unchanged in the interval by my
measurement. Only the lateral and medial most extent of the
distortion/asymmetry has been biopsied. The patient was unable to
receive breast MRI due to claustrophobia. A biopsy in the central
region of asymmetry distortion has been recommended but not
performed. The inferiorly located mass seen on the MLO view is
stable mammographically. The other mass seen with ultrasound has
never been seen with mammography. However, ultrasound could not be
performed today as the patient left the department.

RECOMMENDATION:
Recommend ultrasound of the left breast to ensure stability of the
previously identified probably benign masses. Recommend MRI if the
patient can tolerate. If the patient cannot tolerate MRI, recommend
biopsying the center of the asymmetry/distortion prior to surgery.
Recommend continued surgical consultation.

I have discussed the findings and recommendations with the patient.
If applicable, a reminder letter will be sent to the patient
regarding the next appointment.

BI-RADS CATEGORY  0: Incomplete. Need additional imaging evaluation
and/or prior mammograms for comparison.

ADDENDUM:
The called back from screening mammography in Saturday March, 2020 was
on the left, not the right.

*** End of Addendum ***
ACR Breast Density Category c: The breast tissue is heterogeneously
dense, which may obscure small masses.
FINDINGS: The large region of asymmetry/distortion in the left breast
persists, measuring up to 10 cm by my measurement, not significantly
changed. A coil shaped clip marks the lateral-most biopsy extent and
an X shaped clip marks the medial biopsy site. The mass in the
inferior left breast on the MLO view only is stable
mammographically. The other mass has never been visualized
mammographically. No other changes on the left.

Ultrasound of the left breast masses was recommended but the patient
was unable to stay and left the department before the ultrasound
could be performed.
IMPRESSION: There is a large asymmetry/distortion in the left breast spanning up
to 10 cm by my measurement today, unchanged in the interval by my
measurement. Only the lateral and medial most extent of the
distortion/asymmetry has been biopsied. The patient was unable to
receive breast MRI due to claustrophobia. A biopsy in the central
region of asymmetry distortion has been recommended but not
performed. The inferiorly located mass seen on the MLO view is
stable mammographically. The other mass seen with ultrasound has
never been seen with mammography. However, ultrasound could not be
performed today as the patient left the department.

RECOMMENDATION:
Recommend ultrasound of the left breast to ensure stability of the
previously identified probably benign masses. Recommend MRI if the
patient can tolerate. If the patient cannot tolerate MRI, recommend
biopsying the center of the asymmetry/distortion prior to surgery.
Recommend continued surgical consultation.

I have discussed the findings and recommendations with the patient.
If applicable, a reminder letter will be sent to the patient
regarding the next appointment.

BI-RADS CATEGORY  0: Incomplete. Need additional imaging evaluation
and/or prior mammograms for comparison.

## 2021-08-23 ENCOUNTER — Encounter (HOSPITAL_COMMUNITY): Payer: Self-pay

## 2021-08-25 ENCOUNTER — Emergency Department (HOSPITAL_COMMUNITY)
Admission: EM | Admit: 2021-08-25 | Discharge: 2021-08-25 | Disposition: A | Discharge status: Home / Self Care | Payer: Medicare HMO | Attending: Emergency Medicine | Admitting: Emergency Medicine

## 2021-08-25 ENCOUNTER — Emergency Department (HOSPITAL_COMMUNITY): Payer: Medicare HMO

## 2021-08-25 ENCOUNTER — Encounter (HOSPITAL_COMMUNITY): Payer: Self-pay | Admitting: Emergency Medicine

## 2021-08-25 DIAGNOSIS — M25561 Pain in right knee: Secondary | ICD-10-CM | POA: Insufficient documentation

## 2021-08-25 DIAGNOSIS — M25551 Pain in right hip: Secondary | ICD-10-CM | POA: Diagnosis not present

## 2021-08-25 DIAGNOSIS — Y92531 Health care provider office as the place of occurrence of the external cause: Secondary | ICD-10-CM | POA: Diagnosis not present

## 2021-08-25 DIAGNOSIS — W1839XA Other fall on same level, initial encounter: Secondary | ICD-10-CM | POA: Diagnosis not present

## 2021-08-25 DIAGNOSIS — M25552 Pain in left hip: Secondary | ICD-10-CM | POA: Diagnosis not present

## 2021-08-25 DIAGNOSIS — Y9301 Activity, walking, marching and hiking: Secondary | ICD-10-CM | POA: Insufficient documentation

## 2021-08-25 DIAGNOSIS — Z79899 Other long term (current) drug therapy: Secondary | ICD-10-CM | POA: Diagnosis not present

## 2021-08-25 DIAGNOSIS — M79642 Pain in left hand: Secondary | ICD-10-CM | POA: Diagnosis not present

## 2021-08-25 DIAGNOSIS — R55 Syncope and collapse: Secondary | ICD-10-CM | POA: Diagnosis present

## 2021-08-25 DIAGNOSIS — Z7984 Long term (current) use of oral hypoglycemic drugs: Secondary | ICD-10-CM | POA: Diagnosis not present

## 2021-08-25 DIAGNOSIS — I10 Essential (primary) hypertension: Secondary | ICD-10-CM | POA: Diagnosis not present

## 2021-08-25 DIAGNOSIS — E119 Type 2 diabetes mellitus without complications: Secondary | ICD-10-CM | POA: Diagnosis not present

## 2021-08-25 DIAGNOSIS — Z96653 Presence of artificial knee joint, bilateral: Secondary | ICD-10-CM | POA: Insufficient documentation

## 2021-08-25 DIAGNOSIS — M25562 Pain in left knee: Secondary | ICD-10-CM | POA: Insufficient documentation

## 2021-08-25 DIAGNOSIS — M79641 Pain in right hand: Secondary | ICD-10-CM | POA: Insufficient documentation

## 2021-08-25 LAB — CBC WITH DIFFERENTIAL/PLATELET
Abs Immature Granulocytes: 0.03 10*3/uL (ref 0.00–0.07)
Basophils Absolute: 0 10*3/uL (ref 0.0–0.1)
Basophils Relative: 0 %
Eosinophils Absolute: 0 10*3/uL (ref 0.0–0.5)
Eosinophils Relative: 0 %
HCT: 36 % (ref 36.0–46.0)
Hemoglobin: 12.2 g/dL (ref 12.0–15.0)
Immature Granulocytes: 0 %
Lymphocytes Relative: 11 %
Lymphs Abs: 1 10*3/uL (ref 0.7–4.0)
MCH: 30.9 pg (ref 26.0–34.0)
MCHC: 33.9 g/dL (ref 30.0–36.0)
MCV: 91.1 fL (ref 80.0–100.0)
Monocytes Absolute: 0.5 10*3/uL (ref 0.1–1.0)
Monocytes Relative: 5 %
Neutro Abs: 8.1 10*3/uL — ABNORMAL HIGH (ref 1.7–7.7)
Neutrophils Relative %: 84 %
Platelets: 149 10*3/uL — ABNORMAL LOW (ref 150–400)
RBC: 3.95 MIL/uL (ref 3.87–5.11)
RDW: 14.8 % (ref 11.5–15.5)
WBC: 9.7 10*3/uL (ref 4.0–10.5)
nRBC: 0 % (ref 0.0–0.2)

## 2021-08-25 LAB — COMPREHENSIVE METABOLIC PANEL
ALT: 14 U/L (ref 0–44)
AST: 17 U/L (ref 15–41)
Albumin: 4.7 g/dL (ref 3.5–5.0)
Alkaline Phosphatase: 61 U/L (ref 38–126)
Anion gap: 7 (ref 5–15)
BUN: 22 mg/dL (ref 8–23)
CO2: 26 mmol/L (ref 22–32)
Calcium: 9.1 mg/dL (ref 8.9–10.3)
Chloride: 110 mmol/L (ref 98–111)
Creatinine, Ser: 0.82 mg/dL (ref 0.44–1.00)
GFR, Estimated: >60 mL/min (ref >60)
Glucose, Bld: 134 mg/dL — ABNORMAL HIGH (ref 70–99)
Potassium: 3.8 mmol/L (ref 3.5–5.1)
Sodium: 143 mmol/L (ref 135–145)
Total Bilirubin: 0.5 mg/dL (ref 0.3–1.2)
Total Protein: 7.7 g/dL (ref 6.5–8.1)

## 2021-08-25 MED ORDER — SODIUM CHLORIDE 0.9 % IV BOLUS
1000.0000 mL | Freq: Once | INTRAVENOUS | Status: AC
  Administered 2021-08-25: 1000 mL via INTRAVENOUS

## 2021-08-25 MED ORDER — SODIUM CHLORIDE 0.9 % IV SOLN: INTRAVENOUS | Status: DC

## 2021-08-25 NOTE — Discharge Instructions (Signed)
As discussed, your evaluation today has been largely reassuring.  But, it is important that you monitor your condition carefully, and do not hesitate to return to the ED if you develop new, or concerning changes in your condition. ? ?Otherwise, please follow-up with your physician for appropriate ongoing care. ? ?

## 2021-08-25 NOTE — ED Provider Notes (Signed)
?[facility] DEPT ?Provider Note ? ? ?CSN: [id number] ?Arrival date & time: 08/25/21  1310 ? ?  ? ?History ? ?Chief Complaint  ?Patient presents with  ? Fall  ? ? ?Connie Branch is a 71 y.o. female. ? ?HPI ?Patient presents after episode of syncope.  She notes that she is generally well has no prior similar events.  She notes over the past 2 weeks she has had crampiness in her hand, legs.  Today, after a physician appointment she was outside, felt lightheaded, fell to the ground.  This may have happened twice.  Currently she denies pain, though with additional questioning she states that she might have some pain in her knees history is notable for prior replacement of both knees.  No history of arrhythmia, and today at the visit no new medication, or procedures performed.   ?  ? ?Home Medications ?Prior to Admission medications   ?Medication Sig Start Date End Date Taking? Authorizing Provider  ?acetaminophen (TYLENOL) 500 MG tablet Take 1,000 mg by mouth every 6 (six) hours as needed for moderate pain.    [provider]  ?Ascorbic Acid (VITAMIN C) 1000 MG tablet Take 1,000 mg by mouth daily.    [provider]  ?Blood Glucose Monitoring Suppl (BLOOD GLUCOSE METER) kit Use as instructed 05/24/13   Rama, Venetia Maxon, MD  ?gabapentin (NEURONTIN) 300 MG capsule Take 300 mg by mouth 2 (two) times daily.    [provider]  ?hydrochlorothiazide (HYDRODIURIL) 25 MG tablet Take 1 tablet (25 mg total) by mouth daily. 08/19/20   Mercy Riding, MD  ?KLOR-CON M20 20 MEQ tablet Take 40 mEq by mouth daily. 10/17/20   [provider]  ?losartan (COZAAR) 100 MG tablet Take 100 mg by mouth daily. 07/03/19   [provider]  ?metFORMIN (GLUCOPHAGE) 500 MG tablet Take 500 mg by mouth 2 (two) times daily with a meal. 04/26/17   [provider]  ?methocarbamol (ROBAXIN) 500 MG tablet Take 1 tablet (500 mg total) by mouth every 6 (six) hours as needed for  muscle spasms. ?Patient not taking: Reported on 02/08/2021 07/19/20   Derl Barrow, PA  ?omeprazole (PRILOSEC) 40 MG capsule Take 40 mg by mouth daily. 05/29/17   [provider]  ?simvastatin (ZOCOR) 40 MG tablet Take 40 mg by mouth daily. 05/25/17   [provider]  ?traMADol (ULTRAM) 50 MG tablet Take 1 tablet (50 mg total) by mouth every 6 (six) hours as needed. 01/04/21 01/04/22  Jovita Kussmaul, MD  ?   ? ?Allergies    ?Erythromycin, Aspirin, and Penicillins   ? ?Review of Systems   ?Review of Systems  ?Constitutional:   ?     Per HPI, otherwise negative  ?HENT:    ?     Per HPI, otherwise negative  ?Respiratory:    ?     Per HPI, otherwise negative  ?Cardiovascular:   ?     Per HPI, otherwise negative  ?Gastrointestinal:  Negative for vomiting.  ?Endocrine:  ?     Negative aside from HPI  ?Genitourinary:   ?     Neg aside from HPI   ?Musculoskeletal:   ?     Per HPI, otherwise negative  ?Skin: Negative.   ?Neurological:  Positive for syncope.  ? ?Physical Exam ?Updated Vital Signs ?BP (!) 159/90 (BP Location: Right Arm)   Pulse 80   Temp 98.2 ?F (36.8 ?C) (Oral)   Resp 17 Comment:  Simultaneous filing. User may not have seen previous data.  SpO2 97%  ?Physical Exam ?Vitals and nursing note reviewed.  ?Constitutional:   ?   General: She is not in acute distress. ?   Appearance: She is well-developed.  ?HENT:  ?   Head: Normocephalic and atraumatic.  ?Eyes:  ?   Conjunctiva/sclera: Conjunctivae normal.  ?Cardiovascular:  ?   Rate and Rhythm: Normal rate and regular rhythm.  ?Pulmonary:  ?   Effort: Pulmonary effort is normal. No respiratory distress.  ?   Breath sounds: Normal breath sounds. No stridor.  ?Abdominal:  ?   General: There is no distension.  ?Musculoskeletal:  ?   Comments: No deformities, patient moves each hip, knee spontaneously, with normal range of motion of flexion with both hips, both knees, but with some pain reported left greater than right doing range of motion  exercises.  ?Skin: ?   General: Skin is warm and dry.  ?Neurological:  ?   Mental Status: She is alert and oriented to person, place, and time.  ?   Cranial Nerves: No cranial nerve deficit.  ?Psychiatric:     ?   Mood and Affect: Mood normal.  ? ? ?ED Results / Procedures / Treatments   ?Labs ?(all labs ordered are listed, but only abnormal results are displayed) ?Labs Reviewed  ?COMPREHENSIVE METABOLIC PANEL  ?CBC WITH DIFFERENTIAL/PLATELET  ?CBG MONITORING, ED  ? ? ?EKG ?None ? ?Radiology ?No results found. ? ?Procedures ?Procedures  ? ? ?Medications Ordered in ED ?Medications  ?sodium chloride 0.9 % bolus 1,000 mL (has no administration in time range)  ?  And  ?0.9 %  sodium chloride infusion (has no administration in time range)  ? ? ?ED Course/ Medical Decision Making/ A&P ?This patient with a Hx of multiple medical issues, including diabetes, hypertension, prior knee replacements presents to the ED for concern of syncope with pain, this involves an extensive number of treatment options, and is a complaint that carries with it a high risk of complications and morbidity.   ? ?The differential diagnosis includes rhythm genic syncope, dehydration, electrolyte abnormalities, with subsequent effects including injuries to either knee. ? ? ?Social Determinants of Health: ? ?Age, medical problems ? ?Additional history obtained: ? ?Additional history and/or information obtained from chart review, notable for history of hypomagnesemia, hypocalcemia suggesting consideration of electrolyte abnormalities ? ? ?After the initial evaluation, orders, including: Labs, monitoring were initiated. ? ? ?Patient placed on Cardiac and Pulse-Oximetry Monitors. ?The patient was maintained on a cardiac monitor.  The cardiac monitored showed an rhythm of 80 sinus normal ?The patient was also maintained on pulse oximetry. The readings were typically 100 percent room air normal ? ? ?On repeat evaluation of the patient improved ?6:59  PM ?Patient is awake, alert, watching television, in no distress, moving freely.  We discussed all findings at length, including reassuring labs, ECG, x-ray x3 ?Lab Tests: ? ?I personally interpreted labs.  The pertinent results include: No notable abnormalities ? ?Imaging Studies ordered: ? ?I independently visualized and interpreted imaging which showed no broken bones ?I agree with the radiologist interpretation ? ? ?Dispostion / Final MDM: ? ?After consideration of the diagnostic results and the patient's response to treatment, patient will be discharged. ? ?6:59 PM ?GI discussed all findings at length, including reassuring labs, some suspicion for vagal episode given her description of a prodrome of warm sensation, lightheadedness.  We discussed possibilities including brief arrhythmia, and the patient is amenable to following  up with her physician in this regard.  No evidence for substantial electrolyte abnormalities, no evidence for atypical ACS, no evidence for infection, bacteremia, sepsis. ?Patient comfortable with discharge plan, discharged in stable condition. ?Hospitalization was a consideration given the patient's age, unclear circumstances, but the patient was comfortable with outpatient follow-up. ? ?Final Clinical Impression(s) / ED Diagnoses ?Final diagnoses:  ?Syncope and collapse  ? ?  ?Carmin Muskrat, MD ?08/25/21 1901 ? ?

## 2021-08-25 NOTE — ED Triage Notes (Signed)
Pt arriving via [facility] after a fall at dr office. Pt reports she ate lunch earlier and sat outside for a while then began to feel nauseous. Pt walked into an eye dr office and had a witnessed fall. Pt denies hitting her head. A&O x4. Uses walker at baseline.  ?

## 2022-01-23 ENCOUNTER — Other Ambulatory Visit: Payer: Self-pay | Admitting: Hematology and Oncology

## 2022-01-23 ENCOUNTER — Inpatient Hospital Stay: Payer: Medicare HMO | Attending: Hematology and Oncology | Admitting: Hematology and Oncology

## 2022-01-23 ENCOUNTER — Other Ambulatory Visit: Payer: Self-pay

## 2022-01-23 ENCOUNTER — Encounter: Payer: Self-pay | Admitting: Hematology and Oncology

## 2022-01-23 VITALS — BP 109/63 | HR 77 | Temp 98.1°F | Resp 18 | Ht 63.0 in | Wt 157.5 lb

## 2022-01-23 DIAGNOSIS — Z79899 Other long term (current) drug therapy: Secondary | ICD-10-CM | POA: Diagnosis not present

## 2022-01-23 DIAGNOSIS — Z9189 Other specified personal risk factors, not elsewhere classified: Secondary | ICD-10-CM | POA: Diagnosis not present

## 2022-01-23 DIAGNOSIS — N6092 Unspecified benign mammary dysplasia of left breast: Secondary | ICD-10-CM | POA: Diagnosis present

## 2022-01-23 NOTE — Progress Notes (Signed)
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   Since last visit, her breast pain has finally resolved. She tells me that she is planning to move to [place] next yr. No concerns on physical examination today. She will be due for mammogram in December, this has been ordered.  I have given her the phone number to call and schedule. She expressed understanding of all the recommendations. Return to clinic in 6 months or sooner as needed  HISTORY OF PRESENTING ILLNESS:   Connie Branch 71 y.o. female is here because of atypical lobular hyperplasia.  Oncology History   No history exists.    INTERIM HISTORY She tells me that the breast pain has finally resolved after almost a year after surgery.  She states the scar is actively otherwise she denies any new concerns.  She is still anticipating a move to [place] in Summer 2024. She denies any other complaints.  Rest of the pertinent 10 point ROS reviewed and negative.  MEDICAL HISTORY:  Past Medical History:  Diagnosis Date   Anxiety    panic attacks   ARF (acute renal failure) secondary to dehydration from osmotic diuresis 05/21/2013   KIDNEY FUNCTION NORMAL NOW   Arthritis    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    Cataracts, bilateral    DDD (degenerative disc disease)    DKA (diabetic ketoacidoses) 05/21/2013   TYPE 2   GERD (gastroesophageal reflux disease)    Headache    SINUS   High cholesterol    History of claustrophobia    Hypertension    Neuropathy 2015   feet   Obesity    Osteopenia    PONV (postoperative nausea and vomiting)      SURGICAL HISTORY: Past Surgical History:  Procedure Laterality Date   ABDOMINAL HYSTERECTOMY     COMPLETE   BREAST EXCISIONAL BIOPSY Left 04/20/2020   [place]   BREAST LUMPECTOMY WITH RADIOACTIVE SEED LOCALIZATION Left 01/04/2021   Procedure: LEFT BREAST LUMPECTOMY WITH RADIOACTIVE SEED LOCALIZATION X 3;  Surgeon: Jovita Kussmaul, MD;  Location: [facility];  Service: General;  Laterality: Left;   BREAST SURGERY Left 04/20/2020   [facility]   JOINT REPLACEMENT     KNEE JOINT MANIPULATION Right    OVARIAN CYST AND OVARY REMOVED  YRS AGO   TOTAL KNEE ARTHROPLASTY Right 06/21/2017   Procedure: RIGHT TOTAL KNEE ARTHROPLASTY;  Surgeon: Dorna Leitz, MD;  Location: [facility];  Service: Orthopedics;  Laterality: Right;   TOTAL KNEE ARTHROPLASTY Left 07/18/2020   Procedure: TOTAL KNEE ARTHROPLASTY;  Surgeon: Gaynelle Arabian, MD;  Location: [facility];  Service: Orthopedics;  Laterality: Left;  55mn   TOTAL KNEE REVISION Right 07/22/2019   Procedure: Right knee polyethylene exchange;  Surgeon: AGaynelle Arabian MD;  Location: [facility];  Service: Orthopedics;  Laterality: Right;  1260m    SOCIAL HISTORY: Social History   Socioeconomic History   Marital status: Single    Spouse name: Not on file   Number of children: 0   Years of education: Not on file   Highest education level: Not on file  Occupational History   Occupation:  Disabled.  Tobacco Use   Smoking status: Former    Packs/day: 0.25    Years: 50.00    Total pack years: 12.50    Types: Cigarettes    Quit date: 05/07/2013    Years since quitting: 8.7   Smokeless tobacco: Never  Vaping Use   Vaping Use: Never used  Substance and Sexual Activity   Alcohol use: Not Currently    Comment: rare   Drug use: No   Sexual activity: Not Currently    Birth control/protection: Surgical  Other Topics Concern   Not on file  Social History Narrative   Single.  Lives with family.   Social Determinants of Health   Financial Resource  Strain: Not on file  Food Insecurity: Not on file  Transportation Needs: Not on file  Physical Activity: Not on file  Stress: Not on file  Social Connections: Not on file  Intimate Partner Violence: Not on file    FAMILY HISTORY: Family History  Problem Relation Age of Onset   Breast cancer Neg Hx     ALLERGIES:  is allergic to erythromycin, aspirin, and penicillins.  MEDICATIONS:  Current Outpatient Medications  Medication Sig Dispense Refill   acetaminophen (TYLENOL) 500 MG tablet Take 1,000 mg by mouth every 6 (six) hours as needed for moderate pain.     Ascorbic Acid (VITAMIN C) 1000 MG tablet Take 1,000 mg by mouth daily.     Blood Glucose Monitoring Suppl (BLOOD GLUCOSE METER) kit Use as instructed 1 each 0   gabapentin (NEURONTIN) 300 MG capsule Take 300 mg by mouth 2 (two) times daily.     hydrochlorothiazide (HYDRODIURIL) 25 MG tablet Take 1 tablet (25 mg total) by mouth daily.     KLOR-CON M20 20 MEQ tablet Take 40 mEq by mouth daily.     losartan (COZAAR) 100 MG tablet Take 100 mg by mouth daily.     metFORMIN (GLUCOPHAGE) 500 MG tablet Take 500 mg by mouth 2 (two) times daily with a meal.  0   methocarbamol (ROBAXIN) 500 MG tablet Take 1 tablet (500 mg total) by mouth every 6 (six) hours as needed for muscle spasms. (Patient not taking: Reported on 02/08/2021) 40 tablet 0   omeprazole (PRILOSEC) 40 MG capsule Take 40 mg by mouth daily.  1   simvastatin (ZOCOR) 40 MG tablet Take 40 mg by mouth daily.  3   No current facility-administered medications for this visit.    PHYSICAL EXAMINATION:  ECOG PERFORMANCE STATUS: 0 - Asymptomatic  Vitals:   01/23/22 1213  BP: 109/63  Pulse: 77  Resp: 18  Temp: 98.1 F (36.7 C)  SpO2: 100%     GENERAL:alert, no distress and comfortable Breast: Left breast status postlumpectomy.  No palpable breast masses or regional adenopathy  LABORATORY DATA:  I have reviewed the data as listed Lab Results  Component Value Date    WBC 9.7 08/25/2021   HGB 12.2 08/25/2021   HCT 36.0 08/25/2021   MCV 91.1 08/25/2021   PLT 149 (L) 08/25/2021     Chemistry      Component Value Date/Time   NA 143 08/25/2021 1641   K 3.8 08/25/2021 1641   CL 110 08/25/2021 1641   CO2 26 08/25/2021 1641   BUN 22 08/25/2021 1641   CREATININE 0.82 08/25/2021 1641      Component Value Date/Time   CALCIUM 9.1 08/25/2021 1641   ALKPHOS 61 08/25/2021 1641   AST 17 08/25/2021 1641   ALT  14 08/25/2021 1641   BILITOT 0.5 08/25/2021 1641     FINAL MICROSCOPIC DIAGNOSIS:   A. BREAST, LEFT UPPER OUTER, LUMPECTOMY:  -  Lobular neoplasia (atypical lobular hyperplasia)  -  Adenosis and fibroadenomatoid changes  -  Periductular chronic inflammation  -  Previous biopsy site changes  -  No malignancy identified  -  See comment   B. BREAST, LEFT MEDIAL, LUMPECTOMY:  -  Lobular neoplasia (atypical lobular hyperplasia)  -  No malignancy identified   C. BREAST, LEFT ADDITIONAL DEEP MARGIN, EXCISION:  -  Lobular neoplasia (atypical lobular hyperplasia)  -  Fibroadenomatoid and fibrocystic changes with calcifications  -  No malignancy identified  -  See comment   RADIOGRAPHIC STUDIES: I have personally reviewed the radiological images as listed and agreed with the findings in the report. No results found.   All questions were answered. The patient knows to call the clinic with any problems, questions or concerns. I spent 20 minutes in the care of this patient including history, physical, review of records, counseling and coordination of care    Benay Pike, MD 01/23/2022 1:56 PM

## 2022-04-27 ENCOUNTER — Ambulatory Visit
Admission: RE | Admit: 2022-04-27 | Discharge: 2022-04-27 | Disposition: A | Discharge status: Home / Self Care | Payer: Medicare HMO | Source: Ambulatory Visit | Attending: Hematology and Oncology | Admitting: Hematology and Oncology

## 2022-04-27 DIAGNOSIS — N6092 Unspecified benign mammary dysplasia of left breast: Secondary | ICD-10-CM

## 2022-07-24 ENCOUNTER — Encounter: Payer: Self-pay | Admitting: Hematology and Oncology

## 2022-07-24 ENCOUNTER — Other Ambulatory Visit: Payer: Self-pay

## 2022-07-24 ENCOUNTER — Inpatient Hospital Stay: Payer: Medicare HMO | Attending: Hematology and Oncology | Admitting: Hematology and Oncology

## 2022-07-24 VITALS — BP 132/63 | HR 60 | Temp 97.7°F | Resp 16 | Ht 63.0 in | Wt 161.3 lb

## 2022-07-24 DIAGNOSIS — N6092 Unspecified benign mammary dysplasia of left breast: Secondary | ICD-10-CM | POA: Diagnosis not present

## 2022-07-24 NOTE — Progress Notes (Signed)
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   She today reports no new health issues.  She has made a decision to stay in [place].  On physical exam there appears to be some abnormal density in the right breast upper outer quadrant hence have ordered a right breast ultrasound.  She recently had a diagnostic mammogram which did not show any evidence of malignancy.  She is agreeable for further evaluation. If this does not show any concerns, she will proceed with annual mammogram in December 2024.  She refused antiestrogen therapy at this time.  All her questions were answered to the best of my knowledge.  HISTORY OF PRESENTING ILLNESS:   Connie Branch 72 y.o. female is here because of atypical lobular hyperplasia.  Oncology History   No history exists.    INTERIM HISTORY Ms. Leonila is here for follow-up.  She is doing quite well overall.  She is not moving to [place].  She decided to stay back in [place].  She denies any breast changes.  Rest of the health also appears to be stable.  Rest of the pertinent 10 point ROS reviewed and negative.  MEDICAL HISTORY:  Past Medical History:  Diagnosis Date   Anxiety    panic attacks   ARF (acute renal failure) secondary to dehydration from osmotic diuresis 05/21/2013   KIDNEY FUNCTION NORMAL NOW   Arthritis    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    Cataracts, bilateral    DDD (degenerative disc disease)    DKA (diabetic ketoacidoses) 05/21/2013   TYPE 2   GERD (gastroesophageal reflux disease)    Headache    SINUS   High cholesterol    History  of claustrophobia    Hypertension    Neuropathy 2015   feet   Obesity    Osteopenia    PONV (postoperative nausea and vomiting)     SURGICAL HISTORY: Past Surgical History:  Procedure Laterality Date   ABDOMINAL HYSTERECTOMY     COMPLETE   BREAST EXCISIONAL BIOPSY Left 04/20/2020   [place]   BREAST LUMPECTOMY WITH RADIOACTIVE SEED LOCALIZATION Left 01/04/2021   Procedure: LEFT BREAST LUMPECTOMY WITH RADIOACTIVE SEED LOCALIZATION X 3;  Surgeon: Jovita Kussmaul, MD;  Location: [facility];  Service: General;  Laterality: Left;   BREAST SURGERY Left 04/20/2020   [facility]   JOINT REPLACEMENT     KNEE JOINT MANIPULATION Right    OVARIAN CYST AND OVARY REMOVED  YRS AGO   TOTAL KNEE ARTHROPLASTY Right 06/21/2017   Procedure: RIGHT TOTAL KNEE ARTHROPLASTY;  Surgeon: Dorna Leitz, MD;  Location: [facility];  Service: Orthopedics;  Laterality: Right;   TOTAL KNEE ARTHROPLASTY Left 07/18/2020   Procedure: TOTAL KNEE ARTHROPLASTY;  Surgeon: Gaynelle Arabian, MD;  Location: [facility];  Service: Orthopedics;  Laterality: Left;  58min   TOTAL KNEE REVISION Right 07/22/2019   Procedure: Right knee polyethylene exchange;  Surgeon: Gaynelle Arabian, MD;  Location: [facility];  Service: Orthopedics;  Laterality: Right;  159min    SOCIAL HISTORY: Social History   Socioeconomic History   Marital status: Single    Spouse name: Not on file  Number of children: 0   Years of education: Not on file   Highest education level: Not on file  Occupational History   Occupation: Disabled.  Tobacco Use   Smoking status: Former    Packs/day: 0.25    Years: 50.00    Additional pack years: 0.00    Total pack years: 12.50    Types: Cigarettes    Quit date: 05/07/2013    Years since quitting: 9.2   Smokeless tobacco: Never  Vaping Use   Vaping Use: Never used  Substance and Sexual Activity   Alcohol use: Not Currently    Comment: rare   Drug use: No   Sexual activity: Not Currently    Birth control/protection:  Surgical  Other Topics Concern   Not on file  Social History Narrative   Single.  Lives with family.   Social Determinants of Health   Financial Resource Strain: Not on file  Food Insecurity: Not on file  Transportation Needs: Not on file  Physical Activity: Not on file  Stress: Not on file  Social Connections: Not on file  Intimate Partner Violence: Not on file    FAMILY HISTORY: Family History  Problem Relation Age of Onset   Breast cancer Neg Hx     ALLERGIES:  is allergic to erythromycin, aspirin, and penicillins.  MEDICATIONS:  Current Outpatient Medications  Medication Sig Dispense Refill   acetaminophen (TYLENOL) 500 MG tablet Take 1,000 mg by mouth every 6 (six) hours as needed for moderate pain.     Ascorbic Acid (VITAMIN C) 1000 MG tablet Take 1,000 mg by mouth daily.     Blood Glucose Monitoring Suppl (BLOOD GLUCOSE METER) kit Use as instructed 1 each 0   gabapentin (NEURONTIN) 300 MG capsule Take 300 mg by mouth 2 (two) times daily.     hydrochlorothiazide (HYDRODIURIL) 25 MG tablet Take 1 tablet (25 mg total) by mouth daily.     KLOR-CON M20 20 MEQ tablet Take 40 mEq by mouth daily.     losartan (COZAAR) 100 MG tablet Take 100 mg by mouth daily.     metFORMIN (GLUCOPHAGE) 500 MG tablet Take 500 mg by mouth 2 (two) times daily with a meal.  0   methocarbamol (ROBAXIN) 500 MG tablet Take 1 tablet (500 mg total) by mouth every 6 (six) hours as needed for muscle spasms. (Patient not taking: Reported on 02/08/2021) 40 tablet 0   omeprazole (PRILOSEC) 40 MG capsule Take 40 mg by mouth daily.  1   simvastatin (ZOCOR) 40 MG tablet Take 40 mg by mouth daily.  3   No current facility-administered medications for this visit.    PHYSICAL EXAMINATION:  ECOG PERFORMANCE STATUS: 0 - Asymptomatic  Vitals:   07/24/22 1157  BP: 132/63  Pulse: 60  Resp: 16  Temp: 97.7 F (36.5 C)  SpO2: 100%     GENERAL:alert, no distress and comfortable Breast: In the right breast  upper outer quadrant there appears to be an abnormal density but no evidence of clear palpable mass.  Left breast normal to inspection and palpation.  No regional adenopathy. LABORATORY DATA:  I have reviewed the data as listed Lab Results  Component Value Date   WBC 9.7 08/25/2021   HGB 12.2 08/25/2021   HCT 36.0 08/25/2021   MCV 91.1 08/25/2021   PLT 149 (L) 08/25/2021     Chemistry      Component Value Date/Time   NA 143 08/25/2021 1641   K 3.8 08/25/2021 1641  CL 110 08/25/2021 1641   CO2 26 08/25/2021 1641   BUN 22 08/25/2021 1641   CREATININE 0.82 08/25/2021 1641      Component Value Date/Time   CALCIUM 9.1 08/25/2021 1641   ALKPHOS 61 08/25/2021 1641   AST 17 08/25/2021 1641   ALT 14 08/25/2021 1641   BILITOT 0.5 08/25/2021 1641     FINAL MICROSCOPIC DIAGNOSIS:   A. BREAST, LEFT UPPER OUTER, LUMPECTOMY:  -  Lobular neoplasia (atypical lobular hyperplasia)  -  Adenosis and fibroadenomatoid changes  -  Periductular chronic inflammation  -  Previous biopsy site changes  -  No malignancy identified  -  See comment   B. BREAST, LEFT MEDIAL, LUMPECTOMY:  -  Lobular neoplasia (atypical lobular hyperplasia)  -  No malignancy identified   C. BREAST, LEFT ADDITIONAL DEEP MARGIN, EXCISION:  -  Lobular neoplasia (atypical lobular hyperplasia)  -  Fibroadenomatoid and fibrocystic changes with calcifications  -  No malignancy identified  -  See comment   RADIOGRAPHIC STUDIES: I have personally reviewed the radiological images as listed and agreed with the findings in the report. No results found.   All questions were answered. The patient knows to call the clinic with any problems, questions or concerns. I spent 20 minutes in the care of this patient including history, physical, review of records, counseling and coordination of care    Benay Pike, MD 07/24/2022 11:58 AM

## 2022-07-25 ENCOUNTER — Other Ambulatory Visit: Payer: Self-pay | Admitting: Hematology and Oncology

## 2022-07-25 DIAGNOSIS — N6091 Unspecified benign mammary dysplasia of right breast: Secondary | ICD-10-CM

## 2022-07-25 DIAGNOSIS — R9230 Dense breasts, unspecified: Secondary | ICD-10-CM

## 2022-07-25 DIAGNOSIS — N6092 Unspecified benign mammary dysplasia of left breast: Secondary | ICD-10-CM

## 2022-07-30 ENCOUNTER — Ambulatory Visit
Admission: RE | Admit: 2022-07-30 | Discharge: 2022-07-30 | Disposition: A | Discharge status: Home / Self Care | Payer: Medicare HMO | Source: Ambulatory Visit | Attending: Hematology and Oncology | Admitting: Hematology and Oncology

## 2022-07-30 DIAGNOSIS — N6091 Unspecified benign mammary dysplasia of right breast: Secondary | ICD-10-CM

## 2022-08-20 ENCOUNTER — Inpatient Hospital Stay: Payer: Medicare HMO | Attending: Hematology and Oncology | Admitting: Hematology and Oncology

## 2022-08-20 ENCOUNTER — Other Ambulatory Visit: Payer: Self-pay

## 2022-08-20 VITALS — BP 129/54 | HR 76 | Temp 97.9°F | Resp 16 | Ht 63.0 in | Wt 162.7 lb

## 2022-08-20 DIAGNOSIS — N6092 Unspecified benign mammary dysplasia of left breast: Secondary | ICD-10-CM | POA: Insufficient documentation

## 2022-08-20 DIAGNOSIS — Z79899 Other long term (current) drug therapy: Secondary | ICD-10-CM | POA: Insufficient documentation

## 2022-08-20 DIAGNOSIS — Z87891 Personal history of nicotine dependence: Secondary | ICD-10-CM | POA: Diagnosis not present

## 2022-08-20 NOTE — Progress Notes (Signed)
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   She recently had a mammogram and [place] which didn't show any evidence of malignancy. She is now worried about CAS, has an [place] ordered. She says her RUE becomes very painful and numb from time to time hence his doctor is investigating it. No concerns on exam, breast exam deferred Reassured her about general investigation for CAS and treatment for carotid artery stenosis. She will [place] end of December for follow up and once a yr there after.  HISTORY OF PRESENTING ILLNESS:   Connie Branch 72 y.o. female is here because of atypical lobular hyperplasia.  Oncology History   No history exists.    INTERIM HISTORY Connie Branch is here for follow-up.  Since her last visit here she had another diagnostic mammogram and ultrasound which was unremarkable.  She is now very worried about possible carotid artery stenosis.  Apparently she has been complaining of severe right upper extremity pain and numbness and her PCP is investigating it.  Rest of the pertinent 10 point ROS reviewed and negative.  MEDICAL HISTORY:  Past Medical History:  Diagnosis Date   Anxiety    panic attacks   ARF (acute renal failure) secondary to dehydration from osmotic diuresis 05/21/2013   KIDNEY FUNCTION NORMAL NOW   Arthritis    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    Cataracts, bilateral    DDD (degenerative disc disease)    DKA (diabetic ketoacidoses) 05/21/2013   TYPE 2   GERD (gastroesophageal reflux disease)    Headache    SINUS   High cholesterol    History of claustrophobia     Hypertension    Neuropathy 2015   feet   Obesity    Osteopenia    PONV (postoperative nausea and vomiting)     SURGICAL HISTORY: Past Surgical History:  Procedure Laterality Date   ABDOMINAL HYSTERECTOMY     COMPLETE   BREAST EXCISIONAL BIOPSY Left 04/20/2020   [facility]   BREAST LUMPECTOMY WITH RADIOACTIVE SEED LOCALIZATION Left 01/04/2021   Procedure: LEFT BREAST LUMPECTOMY WITH RADIOACTIVE SEED LOCALIZATION X 3;  Surgeon: Griselda Miner, MD;  Location: [facility];  Service: General;  Laterality: Left;   BREAST SURGERY Left 04/20/2020   [facility]X   JOINT REPLACEMENT     KNEE JOINT MANIPULATION Right    OVARIAN CYST AND OVARY REMOVED  YRS AGO   TOTAL KNEE ARTHROPLASTY Right 06/21/2017   Procedure: RIGHT TOTAL KNEE ARTHROPLASTY;  Surgeon: Jodi Geralds, MD;  Location: [facility];  Service: Orthopedics;  Laterality: Right;   TOTAL KNEE ARTHROPLASTY Left 07/18/2020   Procedure: TOTAL KNEE ARTHROPLASTY;  Surgeon: Ollen Gross, MD;  Location: [facility];  Service: Orthopedics;  Laterality: Left;    TOTAL KNEE REVISION Right 07/22/2019   Procedure: Right knee polyethylene exchange;  Surgeon: Ollen Gross, MD;  Location: [facility];  Service: Orthopedics;  Laterality: Right;     SOCIAL HISTORY: Social History   Socioeconomic History   Marital status: Single    Spouse name: Not on file   Number of children: 0   Years of education: Not on file  Highest education level: Not on file  Occupational History   Occupation: Disabled.  Tobacco Use   Smoking status: Former    Packs/day: 0.25    Years: 50.00    Additional pack years: 0.00    Total pack years: 12.50    Types: Cigarettes    Quit date: 05/07/2013    Years since quitting: 9.2   Smokeless tobacco: Never  Vaping Use   Vaping Use: Never used  Substance and Sexual Activity   Alcohol use: Not Currently    Comment: rare   Drug use: No   Sexual activity: Not Currently    Birth control/protection: Surgical  Other  Topics Concern   Not on file  Social History Narrative   Single.  Lives with family.   Social Determinants of Health   Financial Resource Strain: Not on file  Food Insecurity: Not on file  Transportation Needs: Not on file  Physical Activity: Not on file  Stress: Not on file  Social Connections: Not on file  Intimate Partner Violence: Not on file    FAMILY HISTORY: Family History  Problem Relation Age of Onset   Breast cancer Neg Hx     ALLERGIES:  is allergic to erythromycin, aspirin, and penicillins.  MEDICATIONS:  Current Outpatient Medications  Medication Sig Dispense Refill   acetaminophen (TYLENOL) 500 MG tablet Take 1,000 mg by mouth every 6 (six) hours as needed for moderate pain.     Ascorbic Acid (VITAMIN C) 1000 MG tablet Take 1,000 mg by mouth daily.     Blood Glucose Monitoring Suppl (BLOOD GLUCOSE METER) kit Use as instructed 1 each 0   gabapentin (NEURONTIN) 300 MG capsule Take 300 mg by mouth 2 (two) times daily.     hydrochlorothiazide (HYDRODIURIL) 25 MG tablet Take 1 tablet (25 mg total) by mouth daily.     KLOR-CON M20 20 MEQ tablet Take 40 mEq by mouth daily.     losartan (COZAAR) 100 MG tablet Take 100 mg by mouth daily.     metFORMIN (GLUCOPHAGE) 500 MG tablet Take 500 mg by mouth 2 (two) times daily with a meal.  0   methocarbamol (ROBAXIN) 500 MG tablet Take 1 tablet (500 mg total) by mouth every 6 (six) hours as needed for muscle spasms. (Patient not taking: Reported on 02/08/2021) 40 tablet 0   omeprazole (PRILOSEC) 40 MG capsule Take 40 mg by mouth daily.  1   simvastatin (ZOCOR) 40 MG tablet Take 40 mg by mouth daily.  3   No current facility-administered medications for this visit.    PHYSICAL EXAMINATION:  ECOG PERFORMANCE STATUS: 0 - Asymptomatic  Vitals:   08/20/22 0959  BP: (!) 129/54  Pulse: 76  Resp: 16  Temp: 97.9 F (36.6 C)  SpO2: 100%     Physical Exam Constitutional:      Appearance: Normal appearance.   Cardiovascular:     Rate and Rhythm: Normal rate and regular rhythm.     Pulses: Normal pulses.     Heart sounds: Normal heart sounds.  Pulmonary:     Effort: Pulmonary effort is normal.     Breath sounds: Normal breath sounds.  Musculoskeletal:        General: No swelling.     Cervical back: Normal range of motion and neck supple. No rigidity.     Right lower leg: No edema.     Left lower leg: No edema.  Skin:    General: Skin is warm and dry.  Neurological:  General: No focal deficit present.     Mental Status: She is alert.  Psychiatric:        Mood and Affect: Mood normal.      LABORATORY DATA:  I have reviewed the data as listed Lab Results  Component Value Date   WBC 9.7 08/25/2021   HGB 12.2 08/25/2021   HCT 36.0 08/25/2021   MCV 91.1 08/25/2021   PLT 149 (L) 08/25/2021     Chemistry      Component Value Date/Time   NA 143 08/25/2021 1641   K 3.8 08/25/2021 1641   CL 110 08/25/2021 1641   CO2 26 08/25/2021 1641   BUN 22 08/25/2021 1641   CREATININE 0.82 08/25/2021 1641      Component Value Date/Time   CALCIUM 9.1 08/25/2021 1641   ALKPHOS 61 08/25/2021 1641   AST 17 08/25/2021 1641   ALT 14 08/25/2021 1641   BILITOT 0.5 08/25/2021 1641     FINAL MICROSCOPIC DIAGNOSIS:   A. BREAST, LEFT UPPER OUTER, LUMPECTOMY:  -  Lobular neoplasia (atypical lobular hyperplasia)  -  Adenosis and fibroadenomatoid changes  -  Periductular chronic inflammation  -  Previous biopsy site changes  -  No malignancy identified  -  See comment   B. BREAST, LEFT MEDIAL, LUMPECTOMY:  -  Lobular neoplasia (atypical lobular hyperplasia)  -  No malignancy identified   C. BREAST, LEFT ADDITIONAL DEEP MARGIN, EXCISION:  -  Lobular neoplasia (atypical lobular hyperplasia)  -  Fibroadenomatoid and fibrocystic changes with calcifications  -  No malignancy identified  -  See comment   RADIOGRAPHIC STUDIES: I have personally reviewed the radiological images as listed and  agreed with the findings in the report. MM 3D DIAGNOSTIC MAMMOGRAM UNILATERAL RIGHT BREAST  Result Date: 07/30/2022 CLINICAL DATA:  Patient's physician palpated an abnormality in the right axilla. Patient states that she does not feel a palpable abnormality in this area. EXAM: DIGITAL DIAGNOSTIC UNILATERAL RIGHT MAMMOGRAM WITH TOMOSYNTHESIS; [place] AXILLARY RIGHT TECHNIQUE: Right digital diagnostic mammography and breast tomosynthesis was performed.; Targeted ultrasound examination of the right axilla was performed. COMPARISON:  Previous exam(s). ACR Breast Density Category c: The breasts are heterogeneously dense, which may obscure small masses. FINDINGS: No suspicious mass, malignant type microcalcifications or distortion detected in the right breast. On physical exam, I do not palpate a mass in the right axilla. Targeted ultrasound is performed, showing normal tissue in the right axilla. There is no enlarged adenopathy or suspicious mass. IMPRESSION: No evidence of malignancy in the right breast. RECOMMENDATION: Bilateral screening mammogram in December of 2024 is recommended. I have discussed the findings and recommendations with the patient. If applicable, a reminder letter will be sent to the patient regarding the next appointment. BI-RADS CATEGORY  1: Negative. Electronically Signed   By: Baird Lyons M.D.   On: 07/30/2022 09:19  [place] AXILLA RIGHT  Result Date: 07/30/2022 CLINICAL DATA:  Patient's physician palpated an abnormality in the right axilla. Patient states that she does not feel a palpable abnormality in this area. EXAM: DIGITAL DIAGNOSTIC UNILATERAL RIGHT MAMMOGRAM WITH TOMOSYNTHESIS; [place] AXILLARY RIGHT TECHNIQUE: Right digital diagnostic mammography and breast tomosynthesis was performed.; Targeted ultrasound examination of the right axilla was performed. COMPARISON:  Previous exam(s). ACR Breast Density Category c: The breasts are heterogeneously dense, which may obscure small masses. FINDINGS: No  suspicious mass, malignant type microcalcifications or distortion detected in the right breast. On physical exam, I do not palpate a mass in the right axilla.  Targeted ultrasound is performed, showing normal tissue in the right axilla. There is no enlarged adenopathy or suspicious mass. IMPRESSION: No evidence of malignancy in the right breast. RECOMMENDATION: Bilateral screening mammogram in December of 2024 is recommended. I have discussed the findings and recommendations with the patient. If applicable, a reminder letter will be sent to the patient regarding the next appointment. BI-RADS CATEGORY  1: Negative. Electronically Signed   By: Baird Lyons M.D.   On: 07/30/2022 09:19    All questions were answered. The patient knows to call the clinic with any problems, questions or concerns. I spent 20 minutes in the care of this patient including history, physical, review of records, counseling and coordination of care    Rachel Moulds, MD 08/20/2022 10:04 AM

## 2023-04-10 ENCOUNTER — Encounter (HOSPITAL_COMMUNITY): Payer: Self-pay

## 2023-04-10 ENCOUNTER — Emergency Department (HOSPITAL_COMMUNITY): Payer: Medicare HMO

## 2023-04-10 ENCOUNTER — Emergency Department (HOSPITAL_COMMUNITY)
Admission: EM | Admit: 2023-04-10 | Discharge: 2023-04-10 | Disposition: A | Discharge status: Home / Self Care | Payer: Medicare HMO | Attending: Emergency Medicine | Admitting: Emergency Medicine

## 2023-04-10 ENCOUNTER — Other Ambulatory Visit: Payer: Self-pay

## 2023-04-10 DIAGNOSIS — Z79899 Other long term (current) drug therapy: Secondary | ICD-10-CM | POA: Diagnosis not present

## 2023-04-10 DIAGNOSIS — K429 Umbilical hernia without obstruction or gangrene: Secondary | ICD-10-CM | POA: Insufficient documentation

## 2023-04-10 DIAGNOSIS — R1013 Epigastric pain: Secondary | ICD-10-CM | POA: Diagnosis present

## 2023-04-10 DIAGNOSIS — I10 Essential (primary) hypertension: Secondary | ICD-10-CM | POA: Insufficient documentation

## 2023-04-10 DIAGNOSIS — K439 Ventral hernia without obstruction or gangrene: Secondary | ICD-10-CM

## 2023-04-10 LAB — I-STAT CHEM 8, ED
BUN: 16 mg/dL (ref 8–23)
Calcium, Ion: 1.17 mmol/L (ref 1.15–1.40)
Chloride: 100 mmol/L (ref 98–111)
Creatinine, Ser: 1.1 mg/dL — ABNORMAL HIGH (ref 0.44–1.00)
Glucose, Bld: 102 mg/dL — ABNORMAL HIGH (ref 70–99)
HCT: 41 % (ref 36.0–46.0)
Hemoglobin: 13.9 g/dL (ref 12.0–15.0)
Potassium: 3.8 mmol/L (ref 3.5–5.1)
Sodium: 139 mmol/L (ref 135–145)
TCO2: 27 mmol/L (ref 22–32)

## 2023-04-10 LAB — CBC WITH DIFFERENTIAL/PLATELET
Abs Immature Granulocytes: 0.01 10*3/uL (ref 0.00–0.07)
Basophils Absolute: 0.1 10*3/uL (ref 0.0–0.1)
Basophils Relative: 1 %
Eosinophils Absolute: 0.1 10*3/uL (ref 0.0–0.5)
Eosinophils Relative: 1 %
HCT: 37.1 % (ref 36.0–46.0)
Hemoglobin: 12.2 g/dL (ref 12.0–15.0)
Immature Granulocytes: 0 %
Lymphocytes Relative: 37 %
Lymphs Abs: 2.3 10*3/uL (ref 0.7–4.0)
MCH: 30.1 pg (ref 26.0–34.0)
MCHC: 32.9 g/dL (ref 30.0–36.0)
MCV: 91.6 fL (ref 80.0–100.0)
Monocytes Absolute: 0.5 10*3/uL (ref 0.1–1.0)
Monocytes Relative: 8 %
Neutro Abs: 3.4 10*3/uL (ref 1.7–7.7)
Neutrophils Relative %: 53 %
Platelets: 124 10*3/uL — ABNORMAL LOW (ref 150–400)
RBC: 4.05 MIL/uL (ref 3.87–5.11)
RDW: 14.3 % (ref 11.5–15.5)
WBC: 6.4 10*3/uL (ref 4.0–10.5)
nRBC: 0 % (ref 0.0–0.2)

## 2023-04-10 LAB — COMPREHENSIVE METABOLIC PANEL
ALT: 19 U/L (ref 0–44)
AST: 20 U/L (ref 15–41)
Albumin: 5 g/dL (ref 3.5–5.0)
Alkaline Phosphatase: 68 U/L (ref 38–126)
Anion gap: 10 (ref 5–15)
BUN: 15 mg/dL (ref 8–23)
CO2: 26 mmol/L (ref 22–32)
Calcium: 9.8 mg/dL (ref 8.9–10.3)
Chloride: 101 mmol/L (ref 98–111)
Creatinine, Ser: 0.92 mg/dL (ref 0.44–1.00)
GFR, Estimated: >60 mL/min (ref >60)
Glucose, Bld: 105 mg/dL — ABNORMAL HIGH (ref 70–99)
Potassium: 3.5 mmol/L (ref 3.5–5.1)
Sodium: 137 mmol/L (ref 135–145)
Total Bilirubin: 0.7 mg/dL (ref <1.2)
Total Protein: 8 g/dL (ref 6.5–8.1)

## 2023-04-10 LAB — CBG MONITORING, ED: Glucose-Capillary: 99 mg/dL (ref 70–99)

## 2023-04-10 LAB — LIPASE, BLOOD: Lipase: 32 U/L (ref 11–51)

## 2023-04-10 MED ORDER — IOHEXOL 300 MG/ML  SOLN
100.0000 mL | Freq: Once | INTRAMUSCULAR | Status: AC | PRN
  Administered 2023-04-10: 100 mL via INTRAVENOUS

## 2023-04-10 NOTE — Discharge Instructions (Addendum)
You do have a hernia today on your CAT scan but no other issues.  There is some fat in the hernia which is most likely causing the pain.  Probably from the moving furniture this past week.  Avoid any heavy lifting with the upper body.  It is still okay to be on the bike in the pool.  If you start getting worsening pain that then causes vomiting and unable to have a bowel movement return to the emergency room.

## 2023-04-10 NOTE — ED Triage Notes (Signed)
Pt states she thinks she has a hernia. Pt has a bulge upper abdomen. Said it bulges more and less depending on the day. Started Sunday. 8/10 pain. Been bowling and moving furniture for the holidays. Hx of HTN.

## 2023-04-10 NOTE — ED Notes (Signed)
Pt ambulatory to restroom with walker 

## 2023-04-10 NOTE — ED Provider Notes (Signed)
Assumed care from Dr. Jeraldine Loots at 5 PM.  Patient who I came in with midline abdominal pain with a palpable tender midline hernia.  Labs are reassuring.  I have independently visualized and interpreted pt's images today. CT does show a hernia present.  Radiology reports normal appendix, diverticulosis and no bowel obstruction.  There is a midline ventral anterior supraumbilical hernia that contains fat but no bowel.  Discussed this with the patient.  She is well-appearing otherwise.  Will have patient follow-up with general surgery but did give her return precautions.  She is comfortable with this plan.   Gwyneth Sprout, MD 04/10/23 [phone]

## 2023-04-10 NOTE — ED Provider Notes (Signed)
[facility] AT [facility] Provider Note   CSN: [id number] Arrival date & time: 04/10/23  1319     History {Add pertinent medical, surgical, social history, OB history to HPI:1} Chief Complaint  Patient presents with   Abdominal Pain    Chelise Drain is a 72 y.o. female.  HPI Presents with abdominal pain.  Onset was 3 days ago.  Since that time pain has been persistent in the epigastrium.  She feels as though there is a small protuberant lesion in this area.  She notes that in the days prior she was moving furniture, bowling.  No history of hernia, pancreatitis.  She was well prior to the onset.  Since that time pain has been persistent, uncomfortable, no chest pain, no lower abdominal pain, no vomiting.    Home Medications Prior to Admission medications   Medication Sig Start Date End Date Taking? Authorizing Provider  acetaminophen (TYLENOL) 500 MG tablet Take 1,000 mg by mouth every 6 (six) hours as needed for moderate pain.    [provider]  Ascorbic Acid (VITAMIN C) 1000 MG tablet Take 1,000 mg by mouth daily.    [provider]  Blood Glucose Monitoring Suppl (BLOOD GLUCOSE METER) kit Use as instructed 05/24/13   Rama, Maryruth Bun, MD  gabapentin (NEURONTIN) 300 MG capsule Take 300 mg by mouth 2 (two) times daily.    [provider]  hydrochlorothiazide (HYDRODIURIL) 25 MG tablet Take 1 tablet (25 mg total) by mouth daily. 08/19/20   Almon Hercules, MD  KLOR-CON M20 20 MEQ tablet Take 40 mEq by mouth daily. 10/17/20   [provider]  losartan (COZAAR) 100 MG tablet Take 100 mg by mouth daily. 07/03/19   [provider]  metFORMIN (GLUCOPHAGE) 500 MG tablet Take 500 mg by mouth 2 (two) times daily with a meal. 04/26/17   [provider]  methocarbamol (ROBAXIN) 500 MG tablet Take 1 tablet (500 mg total) by mouth every 6 (six) hours as needed for muscle spasms. Patient not taking: Reported on  02/08/2021 07/19/20   Derenda Fennel, PA  omeprazole (PRILOSEC) 40 MG capsule Take 40 mg by mouth daily. 05/29/17   [provider]  simvastatin (ZOCOR) 40 MG tablet Take 40 mg by mouth daily. 05/25/17   [provider]      Allergies    Erythromycin, Aspirin, and Penicillins    Review of Systems   Review of Systems  Physical Exam Updated Vital Signs BP (!) 157/123   Pulse 91   Temp 97.9 F (36.6 C) (Oral)   Resp 18   Ht 5\' 3"  (1.6 m)   Wt 74.8 kg   SpO2 100%   BMI 29.23 kg/m  Physical Exam Vitals and nursing note reviewed.  Constitutional:      General: She is not in acute distress.    Appearance: She is well-developed.  HENT:     Head: Normocephalic and atraumatic.  Eyes:     Conjunctiva/sclera: Conjunctivae normal.  Cardiovascular:     Rate and Rhythm: Normal rate and regular rhythm.  Pulmonary:     Effort: Pulmonary effort is normal. No respiratory distress.     Breath sounds: No stridor.  Abdominal:     General: There is no distension.     Tenderness: There is abdominal tenderness in the epigastric area.  Skin:    General: Skin is warm and dry.  Neurological:     Mental Status: She is alert and  oriented to person, place, and time.     Cranial Nerves: No cranial nerve deficit.  Psychiatric:        Mood and Affect: Mood normal.     ED Results / Procedures / Treatments   Labs (all labs ordered are listed, but only abnormal results are displayed) Labs Reviewed  CBC WITH DIFFERENTIAL/PLATELET - Abnormal; Notable for the following components:      Result Value   Platelets 124 (*)    All other components within normal limits  COMPREHENSIVE METABOLIC PANEL - Abnormal; Notable for the following components:   Glucose, Bld 105 (*)    All other components within normal limits  I-STAT CHEM 8, ED - Abnormal; Notable for the following components:   Creatinine, Ser 1.10 (*)    Glucose, Bld 102 (*)    All other components within normal limits   LIPASE, BLOOD    EKG None  Radiology No results found.  Procedures Procedures  {Document cardiac monitor, telemetry assessment procedure when appropriate:1}  Medications Ordered in ED Medications  iohexol (OMNIPAQUE) 300 MG/ML solution 100 mL (100 mLs Intravenous Contrast Given 04/10/23 1538)    ED Course/ Medical Decision Making/ A&P   {   Click here for ABCD2, HEART and other calculatorsREFRESH Note before signing :1}                              Medical Decision Making Elderly female presents with new epigastric pain.  Broad differential including pancreatitis, hernia, musculoskeletal injury.  Her exam is otherwise reassuring, vitals reassuring aside from mild hypertension.  Patient had labs CT ordered.  Amount and/or Complexity of Data Reviewed Labs: ordered. Decision-making details documented in ED Course. Radiology: ordered and independent interpretation performed. Decision-making details documented in ED Course.  Risk Prescription drug management.  Cardiac 90 sinus normal Pulse ox 100% room air normal ***  {Document critical care time when appropriate:1} {Document review of labs and clinical decision tools ie heart score, Chads2Vasc2 etc:1}  {Document your independent review of radiology images, and any outside records:1} {Document your discussion with family members, caretakers, and with consultants:1} {Document social determinants of health affecting pt's care:1} {Document your decision making why or why not admission, treatments were needed:1} Final Clinical Impression(s) / ED Diagnoses Final diagnoses:  None    Rx / DC Orders ED Discharge Orders     None

## 2023-04-13 ENCOUNTER — Telehealth: Payer: Self-pay | Admitting: Hematology and Oncology

## 2023-04-13 NOTE — Telephone Encounter (Signed)
Spoke with patient confirming upcoming appointment  

## 2023-04-25 ENCOUNTER — Ambulatory Visit: Payer: Medicare HMO | Admitting: Hematology and Oncology

## 2023-04-26 ENCOUNTER — Ambulatory Visit: Payer: Self-pay | Admitting: General Surgery

## 2023-04-29 ENCOUNTER — Ambulatory Visit
Admission: RE | Admit: 2023-04-29 | Discharge: 2023-04-29 | Disposition: A | Discharge status: Home / Self Care | Payer: Medicare HMO | Source: Ambulatory Visit | Attending: Hematology and Oncology | Admitting: Hematology and Oncology

## 2023-04-29 DIAGNOSIS — N6092 Unspecified benign mammary dysplasia of left breast: Secondary | ICD-10-CM

## 2023-05-10 NOTE — Patient Instructions (Signed)
 DUE TO COVID-19 ONLY TWO VISITORS  (aged 72 and older)  ARE ALLOWED TO COME WITH YOU AND STAY IN THE WAITING ROOM ONLY DURING PRE OP AND PROCEDURE.   **NO VISITORS ARE ALLOWED IN THE SHORT STAY AREA OR RECOVERY ROOM!!**  IF YOU WILL BE ADMITTED INTO THE HOSPITAL YOU ARE ALLOWED ONLY FOUR SUPPORT PEOPLE DURING VISITATION HOURS ONLY (7 AM -8PM)   The support person(s) must pass our screening, gel in and out, and wear a mask at all times, including in the patient's room. Patients must also wear a mask when staff or their support person are in the room. Visitors GUEST BADGE MUST BE WORN VISIBLY  One adult visitor may remain with you overnight and MUST be in the room by 8 P.M.     Your procedure is scheduled on: 05/17/23   Report to [facility] Main Entrance    Report to admitting at: 5:15 AM   Call this number if you have problems the morning of surgery [phone]   Do not eat food or drink: After Midnight.              FOLLOW ANY ADDITIONAL PRE OP INSTRUCTIONS YOU RECEIVED FROM YOUR SURGEON'S OFFICE!!!   Oral Hygiene is also important to reduce your risk of infection.                                    Remember - BRUSH YOUR TEETH THE MORNING OF SURGERY WITH YOUR REGULAR TOOTHPASTE  DENTURES WILL BE REMOVED PRIOR TO SURGERY PLEASE DO NOT APPLY Poly grip OR ADHESIVES!!!   Do NOT smoke after Midnight   Take these medicines the morning of surgery with A SIP OF WATER : omeprazole.Tylenol  as needed.  DO NOT TAKE ANY ORAL DIABETIC MEDICATIONS DAY OF YOUR SURGERY                              You may not have any metal on your body including hair pins, jewelry, and body piercing             Do not wear make-up, lotions, powders, perfumes/cologne, or deodorant  Do not wear nail polish including gel and S&S, artificial/acrylic nails, or any other type of covering on natural nails including finger and toenails. If you have artificial nails, gel coating, etc. that needs to be removed  by a nail salon please have this removed prior to surgery or surgery may need to be canceled/ delayed if the surgeon/ anesthesia feels like they are unable to be safely monitored.   Do not shave  48 hours prior to surgery.    Do not bring valuables to the hospital. [facility] IS NOT             RESPONSIBLE   FOR VALUABLES.   Contacts, glasses, or bridgework may not be worn into surgery.   Bring small overnight bag day of surgery.   DO NOT BRING YOUR HOME MEDICATIONS TO THE HOSPITAL. PHARMACY WILL DISPENSE MEDICATIONS LISTED ON YOUR MEDICATION LIST TO YOU DURING YOUR ADMISSION IN THE HOSPITAL!    Patients discharged on the day of surgery will not be allowed to drive home.  Someone NEEDS to stay with you for the first 24 hours after anesthesia.   Special Instructions: Bring a copy of your healthcare power of attorney and living will documents  the day of surgery if you haven't scanned them before.              Please read over the following fact sheets you were given: IF YOU HAVE QUESTIONS ABOUT YOUR PRE-OP INSTRUCTIONS PLEASE CALL [phone]    [facility] - Preparing for Surgery Before surgery, you can play an important role.  Because skin is not sterile, your skin needs to be as free of germs as possible.  You can reduce the number of germs on your skin by washing with CHG (chlorahexidine gluconate) soap before surgery.  CHG is an antiseptic cleaner which kills germs and bonds with the skin to continue killing germs even after washing. Please DO NOT use if you have an allergy to CHG or antibacterial soaps.  If your skin becomes reddened/irritated stop using the CHG and inform your nurse when you arrive at Short Stay. Do not shave (including legs and underarms) for at least 48 hours prior to the first CHG shower.  You may shave your face/neck. Please follow these instructions carefully:  1.  Shower with CHG Soap the night before surgery and the  morning of Surgery.  2.  If you  choose to wash your hair, wash your hair first as usual with your  normal  shampoo.  3.  After you shampoo, rinse your hair and body thoroughly to remove the  shampoo.                           4.  Use CHG as you would any other liquid soap.  You can apply chg directly  to the skin and wash                       Gently with a scrungie or clean washcloth.  5.  Apply the CHG Soap to your body ONLY FROM THE NECK DOWN.   Do not use on face/ open                           Wound or open sores. Avoid contact with eyes, ears mouth and genitals (private parts).                       Wash face,  Genitals (private parts) with your normal soap.             6.  Wash thoroughly, paying special attention to the area where your surgery  will be performed.  7.  Thoroughly rinse your body with warm water  from the neck down.  8.  DO NOT shower/wash with your normal soap after using and rinsing off  the CHG Soap.                9.  Pat yourself dry with a clean towel.            10.  Wear clean pajamas.            11.  Place clean sheets on your bed the night of your first shower and do not  sleep with pets. Day of Surgery : Do not apply any lotions/deodorants the morning of surgery.  Please wear clean clothes to the hospital/surgery center.  FAILURE TO FOLLOW THESE INSTRUCTIONS MAY RESULT IN THE CANCELLATION OF YOUR SURGERY PATIENT SIGNATURE_________________________________  NURSE SIGNATURE__________________________________  ________________________________________________________________________ WHAT IS A BLOOD TRANSFUSION? Blood Transfusion Information  A transfusion  is the replacement of blood or some of its parts. Blood is made up of multiple cells which provide different functions. Red blood cells carry oxygen and are used for blood loss replacement. White blood cells fight against infection. Platelets control bleeding. Plasma helps clot blood. Other blood products are available for specialized needs,  such as hemophilia or other clotting disorders. BEFORE THE TRANSFUSION  Who gives blood for transfusions?  Healthy volunteers who are fully evaluated to make sure their blood is safe. This is blood bank blood. Transfusion therapy is the safest it has ever been in the practice of medicine. Before blood is taken from a donor, a complete history is taken to make sure that person has no history of diseases nor engages in risky social behavior (examples are intravenous drug use or sexual activity with multiple partners). The donor's travel history is screened to minimize risk of transmitting infections, such as malaria. The donated blood is tested for signs of infectious diseases, such as HIV and hepatitis. The blood is then tested to be sure it is compatible with you in order to minimize the chance of a transfusion reaction. If you or a relative donates blood, this is often done in anticipation of surgery and is not appropriate for emergency situations. It takes many days to process the donated blood. RISKS AND COMPLICATIONS Although transfusion therapy is very safe and saves many lives, the main dangers of transfusion include:  Getting an infectious disease. Developing a transfusion reaction. This is an allergic reaction to something in the blood you were given. Every precaution is taken to prevent this. The decision to have a blood transfusion has been considered carefully by your caregiver before blood is given. Blood is not given unless the benefits outweigh the risks. AFTER THE TRANSFUSION Right after receiving a blood transfusion, you will usually feel much better and more energetic. This is especially true if your red blood cells have gotten low (anemic). The transfusion raises the level of the red blood cells which carry oxygen, and this usually causes an energy increase. The nurse administering the transfusion will monitor you carefully for complications. HOME CARE INSTRUCTIONS  No special  instructions are needed after a transfusion. You may find your energy is better. Speak with your caregiver about any limitations on activity for underlying diseases you may have. SEEK MEDICAL CARE IF:  Your condition is not improving after your transfusion. You develop redness or irritation at the intravenous (IV) site. SEEK IMMEDIATE MEDICAL CARE IF:  Any of the following symptoms occur over the next 12 hours: Shaking chills. You have a temperature by mouth above 102 F (38.9 C), not controlled by medicine. Chest, back, or muscle pain. People around you feel you are not acting correctly or are confused. Shortness of breath or difficulty breathing. Dizziness and fainting. You get a rash or develop hives. You have a decrease in urine output. Your urine turns a dark color or changes to pink, red, or brown. Any of the following symptoms occur over the next 10 days: You have a temperature by mouth above 102 F (38.9 C), not controlled by medicine. Shortness of breath. Weakness after normal activity. The white part of the eye turns yellow (jaundice). You have a decrease in the amount of urine or are urinating less often. Your urine turns a dark color or changes to pink, red, or brown. Document Released: 04/20/2000 Document Revised: 07/16/2011 Document Reviewed: 12/08/2007 [facility] Patient Information 2014 [place], [place].  _______________________________________________________________________

## 2023-05-13 ENCOUNTER — Encounter (HOSPITAL_COMMUNITY): Payer: Self-pay

## 2023-05-13 ENCOUNTER — Inpatient Hospital Stay: Payer: Medicare HMO | Attending: Hematology and Oncology | Admitting: Hematology and Oncology

## 2023-05-13 ENCOUNTER — Encounter (HOSPITAL_COMMUNITY)
Admission: RE | Admit: 2023-05-13 | Discharge: 2023-05-13 | Disposition: A | Discharge status: Home / Self Care | Payer: Medicare HMO | Source: Ambulatory Visit | Attending: General Surgery | Admitting: General Surgery

## 2023-05-13 ENCOUNTER — Other Ambulatory Visit: Payer: Self-pay

## 2023-05-13 VITALS — BP 160/76 | HR 66 | Temp 98.4°F | Ht 63.0 in | Wt 167.0 lb

## 2023-05-13 VITALS — BP 150/67

## 2023-05-13 DIAGNOSIS — Z87891 Personal history of nicotine dependence: Secondary | ICD-10-CM | POA: Diagnosis not present

## 2023-05-13 DIAGNOSIS — E1169 Type 2 diabetes mellitus with other specified complication: Secondary | ICD-10-CM | POA: Insufficient documentation

## 2023-05-13 DIAGNOSIS — D696 Thrombocytopenia, unspecified: Secondary | ICD-10-CM | POA: Insufficient documentation

## 2023-05-13 DIAGNOSIS — E785 Hyperlipidemia, unspecified: Secondary | ICD-10-CM | POA: Insufficient documentation

## 2023-05-13 DIAGNOSIS — K469 Unspecified abdominal hernia without obstruction or gangrene: Secondary | ICD-10-CM | POA: Diagnosis not present

## 2023-05-13 DIAGNOSIS — I10 Essential (primary) hypertension: Secondary | ICD-10-CM | POA: Diagnosis not present

## 2023-05-13 DIAGNOSIS — Z79899 Other long term (current) drug therapy: Secondary | ICD-10-CM | POA: Diagnosis not present

## 2023-05-13 DIAGNOSIS — Z01818 Encounter for other preprocedural examination: Secondary | ICD-10-CM | POA: Diagnosis present

## 2023-05-13 DIAGNOSIS — N6092 Unspecified benign mammary dysplasia of left breast: Secondary | ICD-10-CM

## 2023-05-13 LAB — CBC
HCT: 36.8 % (ref 36.0–46.0)
Hemoglobin: 11.9 g/dL — ABNORMAL LOW (ref 12.0–15.0)
MCH: 29.6 pg (ref 26.0–34.0)
MCHC: 32.3 g/dL (ref 30.0–36.0)
MCV: 91.5 fL (ref 80.0–100.0)
Platelets: 123 10*3/uL — ABNORMAL LOW (ref 150–400)
RBC: 4.02 MIL/uL (ref 3.87–5.11)
RDW: 14.6 % (ref 11.5–15.5)
WBC: 6.3 10*3/uL (ref 4.0–10.5)
nRBC: 0 % (ref 0.0–0.2)

## 2023-05-13 LAB — BASIC METABOLIC PANEL
Anion gap: 11 (ref 5–15)
BUN: 21 mg/dL (ref 8–23)
CO2: 25 mmol/L (ref 22–32)
Calcium: 10.3 mg/dL (ref 8.9–10.3)
Chloride: 102 mmol/L (ref 98–111)
Creatinine, Ser: 0.89 mg/dL (ref 0.44–1.00)
GFR, Estimated: >60 mL/min (ref >60)
Glucose, Bld: 92 mg/dL (ref 70–99)
Potassium: 3.9 mmol/L (ref 3.5–5.1)
Sodium: 138 mmol/L (ref 135–145)

## 2023-05-13 LAB — GLUCOSE, CAPILLARY: Glucose-Capillary: 84 mg/dL (ref 70–99)

## 2023-05-13 NOTE — Progress Notes (Signed)
 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    Hernia Upcoming hernia repair surgery scheduled for later this week.  -No changes to current management.  Breast Health Recent mammogram was normal. No changes in breasts noted on physical exam. -Continue current management and routine screenings.  Thrombocytopenia Stable mild thrombocytopenia noted on CBC, consistent with previous results. -Continue to monitor in routine labs.  Follow-up Current appointment in March to be rescheduled for summer (June or August) per patient preference. -Schedule follow-up appointment for summer 2025.  HISTORY OF PRESENTING ILLNESS:   Connie Branch 73 y.o. female is here because of atypical lobular hyperplasia.  Oncology History   No history exists.   Discussed the use of AI scribe software for clinical note transcription with the patient, who gave verbal consent to proceed.  History of Present Illness         The patient, with a history of breast cancer, presents for a preoperative evaluation prior to hernia repair. She reports that the hernia developed after a strenuous Thanksgiving holiday, during which she was 'moving furniture and cleaning and up three days, cooking all night.' She describes the pain as 'unbelievable' and notes that it was exacerbated by deep breathing. She denies any changes in her breasts since her last mammogram, which was reportedly normal. She has been experiencing weight gain, which she attributes to the hernia.  Rest of the pertinent 10 point ROS reviewed and negative.  MEDICAL HISTORY:  Past  Medical History:  Diagnosis Date   Anxiety    panic attacks   ARF (acute renal failure) secondary to dehydration from osmotic diuresis 05/21/2013   KIDNEY FUNCTION NORMAL NOW   Arthritis    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    Cataracts, bilateral    DDD (degenerative disc disease)    DKA (diabetic ketoacidoses) 05/21/2013   TYPE 2   GERD (gastroesophageal reflux disease)    Headache    SINUS   High cholesterol    History of claustrophobia    Hypertension    Neuropathy 2015   feet   Obesity    Osteopenia    PONV (postoperative nausea and vomiting)     SURGICAL HISTORY: Past Surgical History:  Procedure Laterality Date   ABDOMINAL HYSTERECTOMY     COMPLETE   BREAST EXCISIONAL BIOPSY Left 04/20/2020   ALH   BREAST LUMPECTOMY WITH RADIOACTIVE SEED LOCALIZATION Left 01/04/2021   Procedure: LEFT BREAST LUMPECTOMY WITH RADIOACTIVE SEED LOCALIZATION X 3;  Surgeon: Curvin Deward MOULD, MD;  Location: [facility];  Service: General;  Laterality: Left;   BREAST SURGERY Left 04/20/2020   BX   CATARACT EXTRACTION, BILATERAL     JOINT REPLACEMENT     KNEE JOINT MANIPULATION Right    OVARIAN CYST AND OVARY REMOVED  YRS AGO   TOTAL KNEE ARTHROPLASTY Right 06/21/2017   Procedure: RIGHT TOTAL KNEE ARTHROPLASTY;  Surgeon: Yvone Rush, MD;  Location: [facility];  Service: Orthopedics;  Laterality: Right;   TOTAL KNEE ARTHROPLASTY Left 07/18/2020   Procedure: TOTAL KNEE ARTHROPLASTY;  Surgeon: Melodi Lerner, MD;  Location: [facility];  Service: Orthopedics;  Laterality: Left;    TOTAL KNEE REVISION Right 07/22/2019   Procedure: Right knee polyethylene exchange;  Surgeon: Melodi Lerner, MD;  Location: [facility];  Service: Orthopedics;  Laterality: Right;     SOCIAL HISTORY: Social History   Socioeconomic History   Marital status: Single    Spouse name: Not on file   Number of children: 0   Years of education: Not on file   Highest education level: Not  on file  Occupational History   Occupation: Disabled.  Tobacco Use   Smoking status: Former    Current packs/day: 0.00    Average packs/day: 0.3 packs/day for 50.0 years (12.5 ttl pk-yrs)    Types: Cigarettes    Start date: 05/08/1963    Quit date: 05/07/2013    Years since quitting: 10.0   Smokeless tobacco: Never  Vaping Use   Vaping status: Never Used  Substance and Sexual Activity   Alcohol use: Not Currently    Comment: rare   Drug use: No   Sexual activity: Not Currently    Birth control/protection: Surgical  Other Topics Concern   Not on file  Social History Narrative   Single.  Lives with family.   Social Drivers of Corporate Investment Banker Strain: Not on file  Food Insecurity: Not on file  Transportation Needs: Not on file  Physical Activity: Not on file  Stress: Not on file  Social Connections: Not on file  Intimate Partner Violence: Not on file    FAMILY HISTORY: Family History  Problem Relation Age of Onset   Breast cancer Neg Hx     ALLERGIES:  is allergic to erythromycin, aspirin, and penicillins.  MEDICATIONS:  Current Outpatient Medications  Medication Sig Dispense Refill   acetaminophen  (TYLENOL ) 500 MG tablet Take 1,500 mg by mouth every 6 (six) hours as needed (pain.).     Ascorbic Acid  (VITAMIN C) 1000 MG tablet Take 1,000 mg by mouth in the morning.     Blood Glucose Monitoring Suppl (BLOOD GLUCOSE METER) kit Use as instructed 1 each 0   Cholecalciferol (VITAMIN D-3 PO) Take 2,000 Units by mouth in the morning.     hydrochlorothiazide  (HYDRODIURIL ) 25 MG tablet Take 1 tablet (25 mg total) by mouth daily.     KLOR-CON  M20 20 MEQ tablet Take 20 mEq by mouth in the morning.     losartan  (COZAAR ) 100 MG tablet Take 100 mg by mouth in the morning.     metFORMIN  (GLUCOPHAGE ) 500 MG tablet Take 500 mg by mouth in the morning.  0   omeprazole (PRILOSEC) 40 MG capsule Take 40 mg by mouth in the morning.  1   simvastatin  (ZOCOR ) 80 MG tablet Take 80  mg by mouth in the morning.  3   No current facility-administered medications for this visit.    PHYSICAL EXAMINATION:  ECOG PERFORMANCE STATUS: 0 - Asymptomatic  Vitals:   05/13/23 1200  BP: (!) 150/67     Physical Exam Constitutional:      Appearance: Normal appearance.  Cardiovascular:     Rate and Rhythm: Normal rate and regular rhythm.     Pulses: Normal pulses.     Heart sounds: Normal heart sounds.  Pulmonary:     Effort: Pulmonary effort is normal.     Breath sounds: Normal breath sounds.  Chest:     Comments: Bilateral breasts examined. No palpable masses or regional adenopathy. Musculoskeletal:        General: No swelling.  Cervical back: Normal range of motion and neck supple. No rigidity.     Right lower leg: No edema.     Left lower leg: No edema.  Skin:    General: Skin is warm and dry.  Neurological:     General: No focal deficit present.     Mental Status: She is alert.  Psychiatric:        Mood and Affect: Mood normal.      LABORATORY DATA:  I have reviewed the data as listed Lab Results  Component Value Date   WBC 6.3 05/13/2023   HGB 11.9 (L) 05/13/2023   HCT 36.8 05/13/2023   MCV 91.5 05/13/2023   PLT 123 (L) 05/13/2023     Chemistry      Component Value Date/Time   NA 139 04/10/2023 1514   K 3.8 04/10/2023 1514   CL 100 04/10/2023 1514   CO2 26 04/10/2023 1505   BUN 16 04/10/2023 1514   CREATININE 1.10 (H) 04/10/2023 1514      Component Value Date/Time   CALCIUM 9.8 04/10/2023 1505   ALKPHOS 68 04/10/2023 1505   AST 20 04/10/2023 1505   ALT 19 04/10/2023 1505   BILITOT 0.7 04/10/2023 1505     FINAL MICROSCOPIC DIAGNOSIS:   A. BREAST, LEFT UPPER OUTER, LUMPECTOMY:  -  Lobular neoplasia (atypical lobular hyperplasia)  -  Adenosis and fibroadenomatoid changes  -  Periductular chronic inflammation  -  Previous biopsy site changes  -  No malignancy identified  -  See comment   B. BREAST, LEFT MEDIAL, LUMPECTOMY:  -   Lobular neoplasia (atypical lobular hyperplasia)  -  No malignancy identified   C. BREAST, LEFT ADDITIONAL DEEP MARGIN, EXCISION:  -  Lobular neoplasia (atypical lobular hyperplasia)  -  Fibroadenomatoid and fibrocystic changes with calcifications  -  No malignancy identified  -  See comment   RADIOGRAPHIC STUDIES: I have personally reviewed the radiological images as listed and agreed with the findings in the report. MM DIAG BREAST TOMO BILATERAL Result Date: 04/29/2023 CLINICAL DATA:  73 year old female presenting for annual exam. Patient had left breast excision for [facility] in 2022. No new problems. EXAM: DIGITAL DIAGNOSTIC BILATERAL MAMMOGRAM WITH TOMOSYNTHESIS AND CAD TECHNIQUE: Bilateral digital diagnostic mammography and breast tomosynthesis was performed. The images were evaluated with computer-aided detection. COMPARISON:  Previous exam(s). ACR Breast Density Category c: The breasts are heterogeneously dense, which may obscure small masses. FINDINGS: Right breast: No suspicious mass, distortion, or microcalcifications are identified to suggest presence of malignancy. Left breast: There are stable benign postsurgical changes in the upper central left breast. No suspicious mass, distortion, or microcalcifications are identified to suggest presence of malignancy. IMPRESSION: No mammographic evidence of malignancy in the bilateral breasts. RECOMMENDATION: Screening mammogram in one year.(Code:[id number]-B-01Y) I have discussed the findings and recommendations with the patient. If applicable, a reminder letter will be sent to the patient regarding the next appointment. BI-RADS CATEGORY  2: Benign. Electronically Signed   By: Inocente Ast M.D.   On: 04/29/2023 10:45     All questions were answered. The patient knows to call the clinic with any problems, questions or concerns. I spent 20 minutes in the care of this patient including history, physical, review of records, counseling and coordination of  care    Amber Stalls, MD 05/13/2023 12:18 PM

## 2023-05-13 NOTE — Progress Notes (Signed)
 For Anesthesia: PCP - Cleotilde Planas, MD  Cardiologist - N/A  Bowel Prep reminder:  Chest x-ray -  EKG - 05/13/23 Stress Test -  ECHO -  Cardiac Cath -  Pacemaker/ICD device last checked: Pacemaker orders received: Device Rep notified:  Spinal Cord Stimulator:N/A  Sleep Study - N/A CPAP -   Fasting Blood Sugar - 100's Checks Blood Sugar __1___ times a day Date and result of last Hgb A1c-  Last dose of GLP1 agonist- N/A GLP1 instructions:   Last dose of SGLT-2 inhibitors- N/A SGLT-2 instructions:   Blood Thinner Instructions:N/A Aspirin Instructions: Last Dose:  Activity level: Can go up a flight of stairs and activities of daily living without stopping and without chest pain and/or shortness of breath   Able to exercise without chest pain and/or shortness of breath  Anesthesia review: Hx: HTN,DIA,ARF,DKA,Claustrophobia.  Patient denies shortness of breath, fever, cough and chest pain at PAT appointment   Patient verbalized understanding of instructions that were given to them at the PAT appointment. Patient was also instructed that they will need to review over the PAT instructions again at home before surgery.

## 2023-05-14 LAB — HEMOGLOBIN A1C
Hgb A1c MFr Bld: 5.4 % (ref 4.8–5.6)
Mean Plasma Glucose: 108 mg/dL

## 2023-05-16 NOTE — Anesthesia Preprocedure Evaluation (Addendum)
 Anesthesia Evaluation  Patient identified by MRN, date of birth, ID band Patient awake    Reviewed: Allergy & Precautions, NPO status , Patient's Chart, lab work & pertinent test results  History of Anesthesia Complications (+) PONV and history of anesthetic complications  Airway Mallampati: II  TM Distance: >3 FB Neck ROM: Full    Dental no notable dental hx. (+) Upper Dentures, Partial Lower   Pulmonary former smoker   Pulmonary exam normal breath sounds clear to auscultation       Cardiovascular hypertension, Pt. on medications Normal cardiovascular exam Rhythm:Regular Rate:Normal     Neuro/Psych  Headaches  Anxiety        GI/Hepatic ,GERD  Medicated and Controlled,,  Endo/Other  diabetes, Type 2    Renal/GU Lab Results      Component                Value               Date                       K                        3.9                 05/13/2023                       CREATININE               0.89                05/13/2023                              Musculoskeletal  (+) Arthritis ,    Abdominal   Peds  Hematology  (+) Blood dyscrasia, anemia   Anesthesia Other Findings   Reproductive/Obstetrics                             Anesthesia Physical Anesthesia Plan  ASA: 3  Anesthesia Plan: General   Post-op Pain Management: Ketamine  IV* and Tylenol  PO (pre-op)*   Induction: Intravenous  PONV Risk Score and Plan: 4 or greater and Treatment may vary due to age or medical condition, Midazolam , Ondansetron  and Dexamethasone   Airway Management Planned: Oral ETT  Additional Equipment: None  Intra-op Plan:   Post-operative Plan: Extubation in OR  Informed Consent: I have reviewed the patients History and Physical, chart, labs and discussed the procedure including the risks, benefits and alternatives for the proposed anesthesia with the patient or authorized representative who  has indicated his/her understanding and acceptance.     Dental advisory given  Plan Discussed with: CRNA and Anesthesiologist  Anesthesia Plan Comments:        Anesthesia Quick Evaluation

## 2023-05-17 ENCOUNTER — Ambulatory Visit (HOSPITAL_COMMUNITY): Payer: Medicare HMO | Admitting: Anesthesiology

## 2023-05-17 ENCOUNTER — Other Ambulatory Visit: Payer: Self-pay

## 2023-05-17 ENCOUNTER — Ambulatory Visit (HOSPITAL_BASED_OUTPATIENT_CLINIC_OR_DEPARTMENT_OTHER): Payer: Medicare HMO | Admitting: Anesthesiology

## 2023-05-17 ENCOUNTER — Encounter (HOSPITAL_COMMUNITY)
Admission: RE | Disposition: A | Discharge status: Home / Self Care | Payer: Self-pay | Source: Ambulatory Visit | Attending: General Surgery

## 2023-05-17 ENCOUNTER — Encounter (HOSPITAL_COMMUNITY): Payer: Self-pay | Admitting: General Surgery

## 2023-05-17 ENCOUNTER — Ambulatory Visit (HOSPITAL_COMMUNITY)
Admission: RE | Admit: 2023-05-17 | Discharge: 2023-05-17 | Disposition: A | Discharge status: Home / Self Care | Payer: Medicare HMO | Source: Ambulatory Visit | Attending: General Surgery | Admitting: General Surgery

## 2023-05-17 DIAGNOSIS — Z87891 Personal history of nicotine dependence: Secondary | ICD-10-CM | POA: Insufficient documentation

## 2023-05-17 DIAGNOSIS — Z7984 Long term (current) use of oral hypoglycemic drugs: Secondary | ICD-10-CM | POA: Insufficient documentation

## 2023-05-17 DIAGNOSIS — I10 Essential (primary) hypertension: Secondary | ICD-10-CM | POA: Insufficient documentation

## 2023-05-17 DIAGNOSIS — K436 Other and unspecified ventral hernia with obstruction, without gangrene: Secondary | ICD-10-CM | POA: Insufficient documentation

## 2023-05-17 DIAGNOSIS — K219 Gastro-esophageal reflux disease without esophagitis: Secondary | ICD-10-CM | POA: Insufficient documentation

## 2023-05-17 DIAGNOSIS — E785 Hyperlipidemia, unspecified: Secondary | ICD-10-CM

## 2023-05-17 DIAGNOSIS — E119 Type 2 diabetes mellitus without complications: Secondary | ICD-10-CM | POA: Diagnosis not present

## 2023-05-17 HISTORY — PX: EPIGASTRIC HERNIA REPAIR: SHX404

## 2023-05-17 LAB — GLUCOSE, CAPILLARY
Glucose-Capillary: 108 mg/dL — ABNORMAL HIGH (ref 70–99)
Glucose-Capillary: 141 mg/dL — ABNORMAL HIGH (ref 70–99)

## 2023-05-17 SURGERY — REPAIR, HERNIA, EPIGASTRIC, ADULT
Anesthesia: General

## 2023-05-17 MED ORDER — ONDANSETRON HCL 4 MG/2ML IJ SOLN
4.0000 mg | Freq: Once | INTRAMUSCULAR | Status: AC | PRN
  Administered 2023-05-17: 4 mg via INTRAVENOUS

## 2023-05-17 MED ORDER — DEXAMETHASONE SODIUM PHOSPHATE 10 MG/ML IJ SOLN
INTRAMUSCULAR | Status: DC | PRN
  Administered 2023-05-17: 4 mg via INTRAVENOUS

## 2023-05-17 MED ORDER — ENSURE PRE-SURGERY PO LIQD: 296.0000 mL | Freq: Once | ORAL | Status: DC

## 2023-05-17 MED ORDER — BUPIVACAINE-EPINEPHRINE 0.25% -1:200000 IJ SOLN
INTRAMUSCULAR | Status: AC
  Filled 2023-05-17: qty 1

## 2023-05-17 MED ORDER — MIDAZOLAM HCL 2 MG/2ML IJ SOLN
INTRAMUSCULAR | Status: DC | PRN
  Administered 2023-05-17: 2 mg via INTRAVENOUS

## 2023-05-17 MED ORDER — PROPOFOL 10 MG/ML IV BOLUS
INTRAVENOUS | Status: AC
  Filled 2023-05-17: qty 20

## 2023-05-17 MED ORDER — OXYCODONE HCL 5 MG PO TABS: 5.0000 mg | ORAL_TABLET | Freq: Four times a day (QID) | ORAL | 0 refills | Status: DC | PRN

## 2023-05-17 MED ORDER — BUPIVACAINE LIPOSOME 1.3 % IJ SUSP
INTRAMUSCULAR | Status: AC
  Filled 2023-05-17: qty 20

## 2023-05-17 MED ORDER — CHLORHEXIDINE GLUCONATE CLOTH 2 % EX PADS: 6.0000 | MEDICATED_PAD | Freq: Once | CUTANEOUS | Status: DC

## 2023-05-17 MED ORDER — OXYCODONE HCL 5 MG PO TABS: 5.0000 mg | ORAL_TABLET | Freq: Once | ORAL | Status: DC | PRN

## 2023-05-17 MED ORDER — KETAMINE HCL 50 MG/5ML IJ SOSY
PREFILLED_SYRINGE | INTRAMUSCULAR | Status: AC
  Filled 2023-05-17: qty 5

## 2023-05-17 MED ORDER — OXYCODONE HCL 5 MG/5ML PO SOLN: 5.0000 mg | Freq: Once | ORAL | Status: DC | PRN

## 2023-05-17 MED ORDER — MIDAZOLAM HCL 2 MG/2ML IJ SOLN
INTRAMUSCULAR | Status: AC
  Filled 2023-05-17: qty 2

## 2023-05-17 MED ORDER — OXYCODONE HCL 5 MG PO TABS
ORAL_TABLET | ORAL | Status: AC
  Filled 2023-05-17: qty 1

## 2023-05-17 MED ORDER — FENTANYL CITRATE (PF) 100 MCG/2ML IJ SOLN
INTRAMUSCULAR | Status: AC
  Filled 2023-05-17: qty 2

## 2023-05-17 MED ORDER — HYDROMORPHONE HCL 1 MG/ML IJ SOLN: 0.2500 mg | INTRAMUSCULAR | Status: DC | PRN

## 2023-05-17 MED ORDER — INSULIN ASPART 100 UNIT/ML IJ SOLN
0.0000 [IU] | INTRAMUSCULAR | Status: DC | PRN
Start: 2023-05-17 — End: 2023-05-17

## 2023-05-17 MED ORDER — ONDANSETRON HCL 4 MG/2ML IJ SOLN
INTRAMUSCULAR | Status: AC
  Filled 2023-05-17: qty 2

## 2023-05-17 MED ORDER — KETAMINE HCL 10 MG/ML IJ SOLN
INTRAMUSCULAR | Status: DC | PRN
  Administered 2023-05-17: 20 mg via INTRAVENOUS

## 2023-05-17 MED ORDER — LIDOCAINE HCL (PF) 2 % IJ SOLN
INTRAMUSCULAR | Status: AC
  Filled 2023-05-17: qty 5

## 2023-05-17 MED ORDER — ACETAMINOPHEN 500 MG PO TABS
1000.0000 mg | ORAL_TABLET | ORAL | Status: AC
  Administered 2023-05-17: 1000 mg via ORAL
  Filled 2023-05-17: qty 2

## 2023-05-17 MED ORDER — 0.9 % SODIUM CHLORIDE (POUR BTL) OPTIME
TOPICAL | Status: DC | PRN
  Administered 2023-05-17: 1000 mL

## 2023-05-17 MED ORDER — SUGAMMADEX SODIUM 200 MG/2ML IV SOLN
INTRAVENOUS | Status: DC | PRN
  Administered 2023-05-17: 200 mg via INTRAVENOUS

## 2023-05-17 MED ORDER — ONDANSETRON HCL 4 MG/2ML IJ SOLN
INTRAMUSCULAR | Status: DC | PRN
  Administered 2023-05-17: 4 mg via INTRAVENOUS

## 2023-05-17 MED ORDER — PROPOFOL 10 MG/ML IV BOLUS
INTRAVENOUS | Status: DC | PRN
  Administered 2023-05-17: 130 mg via INTRAVENOUS

## 2023-05-17 MED ORDER — ORAL CARE MOUTH RINSE: 15.0000 mL | Freq: Once | OROMUCOSAL | Status: AC

## 2023-05-17 MED ORDER — ROCURONIUM BROMIDE 10 MG/ML (PF) SYRINGE
PREFILLED_SYRINGE | INTRAVENOUS | Status: AC
  Filled 2023-05-17: qty 10

## 2023-05-17 MED ORDER — DEXAMETHASONE SODIUM PHOSPHATE 10 MG/ML IJ SOLN
INTRAMUSCULAR | Status: AC
  Filled 2023-05-17: qty 1

## 2023-05-17 MED ORDER — ROCURONIUM BROMIDE 10 MG/ML (PF) SYRINGE
PREFILLED_SYRINGE | INTRAVENOUS | Status: DC | PRN
  Administered 2023-05-17: 50 mg via INTRAVENOUS

## 2023-05-17 MED ORDER — LIDOCAINE 2% (20 MG/ML) 5 ML SYRINGE
INTRAMUSCULAR | Status: DC | PRN
  Administered 2023-05-17: 100 mg via INTRAVENOUS

## 2023-05-17 MED ORDER — FENTANYL CITRATE (PF) 250 MCG/5ML IJ SOLN
INTRAMUSCULAR | Status: DC | PRN
  Administered 2023-05-17: 100 ug via INTRAVENOUS
  Administered 2023-05-17 (×2): 50 ug via INTRAVENOUS

## 2023-05-17 MED ORDER — ACETAMINOPHEN 10 MG/ML IV SOLN: 1000.0000 mg | Freq: Once | INTRAVENOUS | Status: DC | PRN

## 2023-05-17 MED ORDER — BUPIVACAINE-EPINEPHRINE 0.25% -1:200000 IJ SOLN
INTRAMUSCULAR | Status: DC | PRN
  Administered 2023-05-17: 30 mL

## 2023-05-17 MED ORDER — CEFAZOLIN SODIUM-DEXTROSE 2-4 GM/100ML-% IV SOLN
2.0000 g | INTRAVENOUS | Status: AC
  Administered 2023-05-17: 2 g via INTRAVENOUS
  Filled 2023-05-17: qty 100

## 2023-05-17 MED ORDER — LACTATED RINGERS IV SOLN: INTRAVENOUS | Status: DC

## 2023-05-17 MED ORDER — IBUPROFEN 800 MG PO TABS: 800.0000 mg | ORAL_TABLET | Freq: Three times a day (TID) | ORAL | 0 refills | Status: DC | PRN

## 2023-05-17 MED ORDER — CHLORHEXIDINE GLUCONATE 0.12 % MT SOLN
15.0000 mL | Freq: Once | OROMUCOSAL | Status: AC
  Administered 2023-05-17: 15 mL via OROMUCOSAL

## 2023-05-17 SURGICAL SUPPLY — 33 items
BAG COUNTER SPONGE SURGICOUNT (BAG) IMPLANT
BENZOIN TINCTURE PRP APPL 2/3 (GAUZE/BANDAGES/DRESSINGS) IMPLANT
CHLORAPREP W/TINT 26 (MISCELLANEOUS) ×1 IMPLANT
COVER SURGICAL LIGHT HANDLE (MISCELLANEOUS) ×1 IMPLANT
DERMABOND ADVANCED .7 DNX12 (GAUZE/BANDAGES/DRESSINGS) IMPLANT
DRAIN CHANNEL 19F RND (DRAIN) IMPLANT
DRAPE LAPAROSCOPIC ABDOMINAL (DRAPES) ×1 IMPLANT
DRSG TELFA 4X10 ISLAND STR (GAUZE/BANDAGES/DRESSINGS) IMPLANT
DRSG TELFA 4X8 ISLAND (GAUZE/BANDAGES/DRESSINGS) IMPLANT
ELECT REM PT RETURN 15FT ADLT (MISCELLANEOUS) ×1 IMPLANT
EVACUATOR SILICONE 100CC (DRAIN) IMPLANT
GLOVE BIOGEL PI IND STRL 7.0 (GLOVE) ×1 IMPLANT
GLOVE SURG SS PI 7.0 STRL IVOR (GLOVE) ×1 IMPLANT
GOWN STRL REUS W/ TWL LRG LVL3 (GOWN DISPOSABLE) ×1 IMPLANT
GOWN STRL REUS W/ TWL XL LVL3 (GOWN DISPOSABLE) IMPLANT
KIT BASIN OR (CUSTOM PROCEDURE TRAY) ×1 IMPLANT
KIT TURNOVER KIT A (KITS) IMPLANT
NDL HYPO 22X1.5 SAFETY MO (MISCELLANEOUS) ×1 IMPLANT
NEEDLE HYPO 22X1.5 SAFETY MO (MISCELLANEOUS) ×1
PACK GENERAL/GYN (CUSTOM PROCEDURE TRAY) ×1 IMPLANT
SPIKE FLUID TRANSFER (MISCELLANEOUS) ×1 IMPLANT
SPONGE DRAIN TRACH 4X4 STRL 2S (GAUZE/BANDAGES/DRESSINGS) IMPLANT
STRIP CLOSURE SKIN 1/2X4 (GAUZE/BANDAGES/DRESSINGS) IMPLANT
SUT ETHILON 2 0 PS N (SUTURE) IMPLANT
SUT MNCRL AB 4-0 PS2 18 (SUTURE) ×1 IMPLANT
SUT NOVA 0 T19/GS 22DT (SUTURE) IMPLANT
SUT NOVA NAB GS-21 0 18 T12 DT (SUTURE) IMPLANT
SUT PDS AB 0 CT1 36 (SUTURE) ×2 IMPLANT
SUT VIC AB 2-0 CT1 TAPERPNT 27 (SUTURE) IMPLANT
SUT VIC AB 3-0 SH 18 (SUTURE) ×1 IMPLANT
SUT VIC AB 3-0 SH 27XBRD (SUTURE) IMPLANT
SYR 20ML LL LF (SYRINGE) ×1 IMPLANT
TOWEL OR 17X26 10 PK STRL BLUE (TOWEL DISPOSABLE) ×1 IMPLANT

## 2023-05-17 NOTE — Transfer of Care (Signed)
 Immediate Anesthesia Transfer of Care Note  Patient: Connie Branch  Procedure(s) Performed: OPEN EPIGASTRIC HERNIA REPAIR  Patient Location: PACU  Anesthesia Type:General  Level of Consciousness: awake, alert , and oriented  Airway & Oxygen Therapy: Patient Spontanous Breathing and Patient connected to face mask oxygen  Post-op Assessment: Report given to RN and Post -op Vital signs reviewed and stable  Post vital signs: Reviewed and stable  Last Vitals:  Vitals Value Taken Time  BP 176/92 05/17/23 0837  Temp    Pulse 88 05/17/23 0839  Resp 11 05/17/23 0839  SpO2 100 % 05/17/23 0839  Vitals shown include unfiled device data.  Last Pain:  Vitals:   05/17/23 0628  TempSrc:   PainSc: 0-No pain      Patients Stated Pain Goal: 6 (05/17/23 9371)  Complications: No notable events documented.

## 2023-05-17 NOTE — H&P (Signed)
 Chief Complaint: NEW PROBLEM (Supraumbilical hernia )  History of Present Illness: Connie Branch is a 73 y.o. female who is seen today as an office consultation for evaluation of NEW PROBLEM (Supraumbilical hernia )  She was preparing her home for Thanksgiving and moved some furniture when she got an acute pain in her upper abdomen and a bulge. Pain was very intense and lasted several hours. She went into the ER and was diagnosed with a epigastric hernia. Bulge is still present but has not been painful since that event. She denies nausea or vomiting.  He does not smoke. She has diabetes controlled with metformin . She has no history of hernias. She did have hypoglycemia after recent colonoscopy  Review of Systems: A complete review of systems was obtained from the patient. I have reviewed this information and discussed as appropriate with the patient. See HPI as well for other ROS.  Review of Systems  Constitutional: Negative.  HENT: Negative.  Eyes: Negative.  Respiratory: Negative.  Cardiovascular: Negative.  Gastrointestinal: Negative.  Genitourinary: Negative.  Musculoskeletal: Negative.  Skin: Negative.  Neurological: Negative.  Endo/Heme/Allergies: Negative.  Psychiatric/Behavioral: Negative.  All other systems reviewed and are negative.   Medical History: Past Medical History:  Diagnosis Date  Arthritis  Diabetes mellitus without complication (CMS/HHS-HCC)  Hypertension   Patient Active Problem List  Diagnosis  Atypical lobular hyperplasia of left breast   Past Surgical History:  Procedure Laterality Date  HYSTERECTOMY  left lumpectomy    Allergies  Allergen Reactions  Erythromycin Base Anaphylaxis  Aspirin Abdominal Pain and Nausea And Vomiting  Penicillins Other (See Comments)  Childhood allergy Has patient had a PCN reaction causing immediate rash, facial/tongue/throat swelling, SOB or lightheadedness with hypotension: Unknown Has patient had a PCN  reaction causing severe rash involving mucus membranes or skin necrosis: Unknown Has patient had a PCN reaction that required hospitalization:Unknown Has patient had a PCN reaction occurring within the last 10 years:No  Tolerated Cephalosporin Date: 07/19/20.   Current Outpatient Medications on File Prior to Visit  Medication Sig Dispense Refill  ascorbic acid , vitamin C, (VITAMIN C) 1000 MG tablet Take by mouth  gabapentin  (NEURONTIN ) 300 MG capsule Take 300 mg by mouth 2 (two) times daily  hydroCHLOROthiazide  (HYDRODIURIL ) 25 MG tablet Take 1 tablet by mouth once daily  losartan  (COZAAR ) 100 MG tablet Take 100 mg by mouth once daily  metFORMIN  (GLUCOPHAGE ) 500 MG tablet TAKE 1 TO 2 TABLETS BY MOUTH IN THE MORNING  omeprazole (PRILOSEC) 40 MG DR capsule Take 40 mg by mouth every morning  potassium chloride  (KLOR-CON ) 20 MEQ ER tablet Take by mouth  simvastatin  (ZOCOR ) 40 MG tablet Take 40 mg by mouth once daily  traMADoL  (ULTRAM ) 50 mg tablet tramadol  50 mg tablet   No current facility-administered medications on file prior to visit.   History reviewed. No pertinent family history.   Social History   Tobacco Use  Smoking Status Former  Current packs/day: 0.00  Types: Cigarettes  Quit date: 2016  Years since quitting: 8.9  Smokeless Tobacco Never    Social History   Socioeconomic History  Marital status: Single  Tobacco Use  Smoking status: Former  Current packs/day: 0.00  Types: Cigarettes  Quit date: 2016  Years since quitting: 8.9  Smokeless tobacco: Never  Vaping Use  Vaping status: Never Used  Substance and Sexual Activity  Alcohol use: Never  Drug use: Never   Objective:   Vitals:  04/18/23 1408  BP: (!) 151/87  Pulse: 73  Temp: 36.1 C (97 F)  Weight: 75.3 kg (166 lb)  Height: 160 cm (5' 3)   Body mass index is 29.41 kg/m.  Physical Exam Constitutional:  Appearance: Normal appearance.  HENT:  Head: Normocephalic and atraumatic.  Pulmonary:   Effort: Pulmonary effort is normal.  Abdominal:  Comments: Moderately large epigastric hernia only a few centimeters inferior to the xiphoid  Musculoskeletal:  General: Normal range of motion.  Cervical back: Normal range of motion.  Neurological:  General: No focal deficit present.  Mental Status: She is alert and oriented to person, place, and time. Mental status is at baseline.  Psychiatric:  Mood and Affect: Mood normal.  Behavior: Behavior normal.  Thought Content: Thought content normal.     Labs, Imaging and Diagnostic Testing: I reviewed notes by Charmaine Bough. I reviewed CT scan images showing a moderately sized epigastric hernia containing omentum with a small fascial defect of 1.6 cm  Assessment and Plan:   Diagnoses and all orders for this visit:  Ventral hernia without obstruction or gangrene  Type 2 diabetes mellitus with hypoglycemia without coma, without long-term current use of insulin  (CMS/HHS-HCC)   The patient has a symptomatic reducible hernia. We discussed the etiology of his hernia, the risk of it enlarging, incarceration, obstruction, strangulation, and that it is unlikely to get smaller or better on its own. We discussed operative options of laparoscopic vs open repair with mesh including the risks of recurrence, injury to intestines or abdominal organs, or chronic pain associated with mesh. We decided to proceed with open epigastric hernia repair with mesh as outpatient.

## 2023-05-17 NOTE — Anesthesia Postprocedure Evaluation (Signed)
 Anesthesia Post Note  Patient: Connie Branch  Procedure(s) Performed: OPEN EPIGASTRIC HERNIA REPAIR     Patient location during evaluation: PACU Anesthesia Type: General Level of consciousness: awake and alert Pain management: pain level controlled Vital Signs Assessment: post-procedure vital signs reviewed and stable Respiratory status: spontaneous breathing, nonlabored ventilation, respiratory function stable and patient connected to nasal cannula oxygen Cardiovascular status: blood pressure returned to baseline and stable Postop Assessment: no apparent nausea or vomiting Anesthetic complications: no   No notable events documented.  Last Vitals:  Vitals:   05/17/23 0922 05/17/23 0933  BP:  (!) 183/71  Pulse:  64  Resp:    Temp: 36.8 C 36.8 C  SpO2:  100%    Last Pain:  Vitals:   05/17/23 0933  TempSrc:   PainSc: 2                  Connie Branch

## 2023-05-17 NOTE — Op Note (Addendum)
 PATIENT:  Connie Branch  73 y.o. female  PRE-OPERATIVE DIAGNOSIS:  Epigastric  POST-OPERATIVE DIAGNOSIS:  Epigastric  PROCEDURE:  Procedure(s): OPEN 1.5 cm incarcerated EPIGASTRIC HERNIA REPAIR   SURGEON:  Surgeon(s): Lucinda Spells, Herlene Righter, MD  ASSISTANT: none   ANESTHESIA:   local and general  Indications for procedure: [name] is a 73 y.o. year old female with symptoms of abdominal pain and bulge in the upper abdomen.  Description of procedure: The patient was brought into the operative suite. Anesthesia was administered with General endotracheal anesthesia. WHO checklist was applied. The patient was then placed in supine. The area was prepped and draped in the usual sterile fashion.  Next the infraumbilical skin was anesthetized with Marcaine . A upper midline incision was made. Cautery and blunt dissection was used to dissect down to the fascia. The hernia sac was dissected free from surrounding tissues in 360 degrees.  The hernia sac contained omentum and was opened and contents reduced into the peritoneal space.  The hernia defect was 1.5 cm in diameter. The hernia sac was removed. Due to the size of the hernia, Due to size of hernia, no mesh was utilized. The fascial defect was then primarily closed with interrupted 0 PDS sutures. The umbilical skin was sutured to the fascia with a 3-0 vicryl. The deep dermal space was closed with a 3-0 vicryl. Marcaine  was injected into the muscle layer and around the fascia. The skin was closed with a 4-0 monocryl subcuticular suture. Dermabond was put in place for dressing. The patient awoke from anesthesia and was brought to pacu in stable condition. All counts were correct.  Findings: 1.5 cm epigastric hernia  Specimen: none  Blood loss: 10 ml  Local anesthesia: 30 ml Marcaine   Complications: none  PLAN OF CARE: Discharge to home after PACU  PATIENT DISPOSITION:  PACU - hemodynamically stable.  Herlene Bureau,  M.D. General, Bariatric, & Minimally Invasive Surgery [facility], [place]  05/17/2023 8:32 AM

## 2023-05-17 NOTE — Anesthesia Procedure Notes (Signed)
 Procedure Name: Intubation Date/Time: 05/17/2023 7:42 AM  Performed by: Dasie Nena PARAS, CRNAPre-anesthesia Checklist: Patient identified, Emergency Drugs available, Suction available, Patient being monitored and Timeout performed Patient Re-evaluated:Patient Re-evaluated prior to induction Oxygen Delivery Method: Circle system utilized Preoxygenation: Pre-oxygenation with 100% oxygen Induction Type: IV induction Ventilation: Mask ventilation without difficulty Laryngoscope Size: Miller and 3 Grade View: Grade I Tube type: Oral Tube size: 7.0 mm Number of attempts: 1 Airway Equipment and Method: Stylet Placement Confirmation: ETT inserted through vocal cords under direct vision, positive ETCO2 and breath sounds checked- equal and bilateral Secured at: 21 cm Tube secured with: Tape Dental Injury: Teeth and Oropharynx as per pre-operative assessment

## 2023-05-18 ENCOUNTER — Encounter (HOSPITAL_COMMUNITY): Payer: Self-pay | Admitting: General Surgery

## 2023-07-22 ENCOUNTER — Emergency Department (HOSPITAL_COMMUNITY)

## 2023-07-22 ENCOUNTER — Other Ambulatory Visit: Payer: Self-pay

## 2023-07-22 ENCOUNTER — Encounter (HOSPITAL_COMMUNITY): Payer: Self-pay | Admitting: Emergency Medicine

## 2023-07-22 ENCOUNTER — Emergency Department (HOSPITAL_COMMUNITY)
Admission: EM | Admit: 2023-07-22 | Discharge: 2023-07-23 | Disposition: A | Discharge status: Home / Self Care | Attending: Emergency Medicine | Admitting: Emergency Medicine

## 2023-07-22 DIAGNOSIS — E119 Type 2 diabetes mellitus without complications: Secondary | ICD-10-CM | POA: Diagnosis not present

## 2023-07-22 DIAGNOSIS — Z79899 Other long term (current) drug therapy: Secondary | ICD-10-CM | POA: Diagnosis not present

## 2023-07-22 DIAGNOSIS — N179 Acute kidney failure, unspecified: Secondary | ICD-10-CM | POA: Diagnosis not present

## 2023-07-22 DIAGNOSIS — Z7984 Long term (current) use of oral hypoglycemic drugs: Secondary | ICD-10-CM | POA: Diagnosis not present

## 2023-07-22 DIAGNOSIS — R079 Chest pain, unspecified: Secondary | ICD-10-CM | POA: Insufficient documentation

## 2023-07-22 DIAGNOSIS — R0602 Shortness of breath: Secondary | ICD-10-CM | POA: Insufficient documentation

## 2023-07-22 DIAGNOSIS — I10 Essential (primary) hypertension: Secondary | ICD-10-CM | POA: Insufficient documentation

## 2023-07-22 LAB — BASIC METABOLIC PANEL
Anion gap: 9 (ref 5–15)
BUN: 28 mg/dL — ABNORMAL HIGH (ref 8–23)
CO2: 26 mmol/L (ref 22–32)
Calcium: 10 mg/dL (ref 8.9–10.3)
Chloride: 102 mmol/L (ref 98–111)
Creatinine, Ser: 1.28 mg/dL — ABNORMAL HIGH (ref 0.44–1.00)
GFR, Estimated: 45 mL/min — ABNORMAL LOW (ref >60)
Glucose, Bld: 119 mg/dL — ABNORMAL HIGH (ref 70–99)
Potassium: 3.9 mmol/L (ref 3.5–5.1)
Sodium: 137 mmol/L (ref 135–145)

## 2023-07-22 LAB — CBC
HCT: 36.5 % (ref 36.0–46.0)
Hemoglobin: 12 g/dL (ref 12.0–15.0)
MCH: 29.7 pg (ref 26.0–34.0)
MCHC: 32.9 g/dL (ref 30.0–36.0)
MCV: 90.3 fL (ref 80.0–100.0)
Platelets: 129 10*3/uL — ABNORMAL LOW (ref 150–400)
RBC: 4.04 MIL/uL (ref 3.87–5.11)
RDW: 14.7 % (ref 11.5–15.5)
WBC: 6.7 10*3/uL (ref 4.0–10.5)
nRBC: 0 % (ref 0.0–0.2)

## 2023-07-22 LAB — TROPONIN I (HIGH SENSITIVITY)
Troponin I (High Sensitivity): 2 ng/L (ref <18)
Troponin I (High Sensitivity): 3 ng/L (ref <18)

## 2023-07-22 NOTE — ED Provider Notes (Signed)
 [place] EMERGENCY DEPARTMENT AT [facility] Provider Note   CSN: [id number] Arrival date & time: 07/22/23  1710     History {Add pertinent medical, surgical, social history, OB history to HPI:1} Chief Complaint  Patient presents with  . Chest Pain  . Shortness of Breath    Connie Branch is a 73 y.o. female.  Patient with past medical history significant for hypertension, GERD, type II DM presents to the emergency department complaining of left-sided chest pain with radiation to the left arm.  She states that yesterday she had left-sided chest pain that she attributed to her GERD.  She states that pain continued overnight and today she had some pain in the left shoulder.  The pain radiated down the left arm.  She states that she went to her primary care who diagnosed her with a sinus infection and prescribed doxycycline.  The patient mentioned her chest pain and since the primary care office was unable to rule out ACS they recommended she come to the emergency department for evaluation.  She did report some mild shortness of breath but is currently asymptomatic.  She denies any aggravating or alleviating factors.  She states the pain subsided on its own without intervention.  She does endorse recent respiratory symptoms, diagnosed with sinusitis by primary care.  She denies abdominal pain, nausea, vomiting.   Chest Pain Associated symptoms: shortness of breath   Shortness of Breath Associated symptoms: chest pain        Home Medications Prior to Admission medications   Medication Sig Start Date End Date Taking? Authorizing Provider  acetaminophen (TYLENOL) 500 MG tablet Take 1,500 mg by mouth every 6 (six) hours as needed (pain.).    [provider]  Ascorbic Acid (VITAMIN C) 1000 MG tablet Take 1,000 mg by mouth in the morning.    [provider]  Blood Glucose Monitoring Suppl (BLOOD GLUCOSE METER) kit Use as instructed 05/24/13   Rama, Maryruth Bun,  MD  Cholecalciferol (VITAMIN D-3 PO) Take 2,000 Units by mouth in the morning.    [provider]  hydrochlorothiazide (HYDRODIURIL) 25 MG tablet Take 1 tablet (25 mg total) by mouth daily. 08/19/20   Almon Hercules, MD  ibuprofen (ADVIL) 800 MG tablet Take 1 tablet (800 mg total) by mouth every 8 (eight) hours as needed. 05/17/23   Kinsinger, De Blanch, MD  KLOR-CON M20 20 MEQ tablet Take 20 mEq by mouth in the morning. 10/17/20   [provider]  losartan (COZAAR) 100 MG tablet Take 100 mg by mouth in the morning. 07/03/19   [provider]  metFORMIN (GLUCOPHAGE) 500 MG tablet Take 500 mg by mouth in the morning. 04/26/17   [provider]  omeprazole (PRILOSEC) 40 MG capsule Take 40 mg by mouth in the morning. 05/29/17   [provider]  oxyCODONE (OXY IR/ROXICODONE) 5 MG immediate release tablet Take 1 tablet (5 mg total) by mouth every 6 (six) hours as needed for severe pain (pain score 7-10). 05/17/23   Kinsinger, De Blanch, MD  simvastatin (ZOCOR) 80 MG tablet Take 80 mg by mouth in the morning. 05/25/17   [provider]      Allergies    Erythromycin, Aspirin, and Penicillins    Review of Systems   Review of Systems  Respiratory:  Positive for shortness of breath.   Cardiovascular:  Positive for chest pain.    Physical Exam Updated Vital Signs BP (!) 168/82   Pulse 66  Temp 98.5 F (36.9 C) (Oral)   Resp 13   SpO2 98%  Physical Exam Vitals and nursing note reviewed.  Constitutional:      General: She is not in acute distress.    Appearance: She is well-developed.  HENT:     Head: Normocephalic and atraumatic.  Eyes:     Conjunctiva/sclera: Conjunctivae normal.  Cardiovascular:     Rate and Rhythm: Normal rate and regular rhythm.  Pulmonary:     Effort: Pulmonary effort is normal. No respiratory distress.     Breath sounds: Normal breath sounds.  Chest:     Chest wall: No tenderness.  Abdominal:     Palpations:  Abdomen is soft.     Tenderness: There is no abdominal tenderness.  Musculoskeletal:        General: No swelling.     Cervical back: Neck supple.     Right lower leg: No edema.     Left lower leg: No edema.  Skin:    General: Skin is warm and dry.     Capillary Refill: Capillary refill takes less than 2 seconds.  Neurological:     Mental Status: She is alert.  Psychiatric:        Mood and Affect: Mood normal.    ED Results / Procedures / Treatments   Labs (all labs ordered are listed, but only abnormal results are displayed) Labs Reviewed  BASIC METABOLIC PANEL - Abnormal; Notable for the following components:      Result Value   Glucose, Bld 119 (*)    BUN 28 (*)    Creatinine, Ser 1.28 (*)    GFR, Estimated 45 (*)    All other components within normal limits  CBC - Abnormal; Notable for the following components:   Platelets 129 (*)    All other components within normal limits  TROPONIN I (HIGH SENSITIVITY)  TROPONIN I (HIGH SENSITIVITY)    EKG None  Radiology DG Chest 2 View Result Date: 07/22/2023 CLINICAL DATA:  Chest pain EXAM: CHEST - 2 VIEW COMPARISON:  08/25/2021 FINDINGS: The heart size and mediastinal contours are within normal limits. Both lungs are clear. The visualized skeletal structures are unremarkable. IMPRESSION: No active cardiopulmonary disease. Electronically Signed   By: Charlett Nose M.D.   On: 07/22/2023 19:32    Procedures Procedures  {Document cardiac monitor, telemetry assessment procedure when appropriate:1}  Medications Ordered in ED Medications - No data to display  ED Course/ Medical Decision Making/ A&P   {   Click here for ABCD2, HEART and other calculatorsREFRESH Note before signing :1}                              Medical Decision Making  This patient presents to the ED for concern of chest pain, this involves an extensive number of treatment options, and is a complaint that carries with it a high risk of complications and  morbidity.  The differential diagnosis includes ACS, pneumonia, musculoskeletal pain, anxiety, GERD.  Less likely differentials would include pulmonary embolism   Co morbidities that complicate the patient evaluation  GERD, type II DM, hypertension   Additional history obtained:   External records from outside source obtained and reviewed including primary care notes   Lab Tests:  I Ordered, and personally interpreted labs.  The pertinent results include: Initial troponin 2, mildly elevated creatinine at 1.28 with elevated BUN 28   Imaging Studies ordered:  I  ordered imaging studies including chest x-ray I independently visualized and interpreted imaging which showed no active disease I agree with the radiologist interpretation   Cardiac Monitoring: / EKG:  The patient was maintained on a cardiac monitor.  I personally viewed and interpreted the cardiac monitored which showed an underlying rhythm of: sinus rhythm   Consultations Obtained:  I requested consultation with the ***,  and discussed lab and imaging findings as well as pertinent plan - they recommend: ***   Problem List / ED Course / Critical interventions / Medication management  *** I ordered medication including ***  for ***  Reevaluation of the patient after these medicines showed that the patient {resolved/improved/worsened:23923::"improved"} I have reviewed the patients home medicines and have made adjustments as needed   Social Determinants of Health:  ***   Test / Admission - Considered:  ***   {Document critical care time when appropriate:1} {Document review of labs and clinical decision tools ie heart score, Chads2Vasc2 etc:1}  {Document your independent review of radiology images, and any outside records:1} {Document your discussion with family members, caretakers, and with consultants:1} {Document social determinants of health affecting pt's care:1} {Document your decision making why or  why not admission, treatments were needed:1} Final Clinical Impression(s) / ED Diagnoses Final diagnoses:  None    Rx / DC Orders ED Discharge Orders     None

## 2023-07-22 NOTE — ED Triage Notes (Signed)
 Patient presents due to chest pain that radiates into the left shoulder. Pain started yesterday. She originally thought it was acid reflux, but the pain came back and radiated down the left shoulder. She also complains of shortness of breath.

## 2023-07-22 NOTE — Discharge Instructions (Signed)
 Your workup tonight was reassuring.  Your labs do show signs that you may be dehydrated.  I recommend hydrating with water at home.  Please schedule a follow-up appoint with your primary care provider in 1 week to recheck your metabolic panel to ensure that your kidney function is back to normal.  If you develop any life-threatening symptoms please return to the emergency department.

## 2023-07-22 NOTE — ED Provider Notes (Incomplete)
 [place] EMERGENCY DEPARTMENT AT [facility] Provider Note   CSN: [id number] Arrival date & time: 07/22/23  1710     History {Add pertinent medical, surgical, social history, OB history to HPI:1} Chief Complaint  Patient presents with  . Chest Pain  . Shortness of Breath    Connie Branch is a 73 y.o. female.  Patient with past medical history significant for hypertension, GERD, type II DM presents to the emergency department complaining of left-sided chest pain with radiation to the left arm.  She states that yesterday she had left-sided chest pain that she attributed to her GERD.  She states that pain continued overnight and today she had some pain in the left shoulder.  The pain radiated down the left arm.  She states that she went to her primary care who diagnosed her with a sinus infection and prescribed doxycycline.  The patient mentioned her chest pain and since the primary care office was unable to rule out ACS they recommended she come to the emergency department for evaluation.  She did report some mild shortness of breath but is currently asymptomatic.  She denies any aggravating or alleviating factors.  She states the pain subsided on its own without intervention.  She does endorse recent respiratory symptoms, diagnosed with sinusitis by primary care.  She denies abdominal pain, nausea, vomiting.   Chest Pain Associated symptoms: shortness of breath   Shortness of Breath Associated symptoms: chest pain        Home Medications Prior to Admission medications   Medication Sig Start Date End Date Taking? Authorizing Provider  acetaminophen (TYLENOL) 500 MG tablet Take 1,500 mg by mouth every 6 (six) hours as needed (pain.).    [provider]  Ascorbic Acid (VITAMIN C) 1000 MG tablet Take 1,000 mg by mouth in the morning.    [provider]  Blood Glucose Monitoring Suppl (BLOOD GLUCOSE METER) kit Use as instructed 05/24/13   Rama, Maryruth Bun,  MD  Cholecalciferol (VITAMIN D-3 PO) Take 2,000 Units by mouth in the morning.    [provider]  hydrochlorothiazide (HYDRODIURIL) 25 MG tablet Take 1 tablet (25 mg total) by mouth daily. 08/19/20   Almon Hercules, MD  ibuprofen (ADVIL) 800 MG tablet Take 1 tablet (800 mg total) by mouth every 8 (eight) hours as needed. 05/17/23   Kinsinger, De Blanch, MD  KLOR-CON M20 20 MEQ tablet Take 20 mEq by mouth in the morning. 10/17/20   [provider]  losartan (COZAAR) 100 MG tablet Take 100 mg by mouth in the morning. 07/03/19   [provider]  metFORMIN (GLUCOPHAGE) 500 MG tablet Take 500 mg by mouth in the morning. 04/26/17   [provider]  omeprazole (PRILOSEC) 40 MG capsule Take 40 mg by mouth in the morning. 05/29/17   [provider]  oxyCODONE (OXY IR/ROXICODONE) 5 MG immediate release tablet Take 1 tablet (5 mg total) by mouth every 6 (six) hours as needed for severe pain (pain score 7-10). 05/17/23   Kinsinger, De Blanch, MD  simvastatin (ZOCOR) 80 MG tablet Take 80 mg by mouth in the morning. 05/25/17   [provider]      Allergies    Erythromycin, Aspirin, and Penicillins    Review of Systems   Review of Systems  Respiratory:  Positive for shortness of breath.   Cardiovascular:  Positive for chest pain.    Physical Exam Updated Vital Signs BP (!) 168/82   Pulse 66  Temp 98.5 F (36.9 C) (Oral)   Resp 13   SpO2 98%  Physical Exam Vitals and nursing note reviewed.  Constitutional:      General: She is not in acute distress.    Appearance: She is well-developed.  HENT:     Head: Normocephalic and atraumatic.  Eyes:     Conjunctiva/sclera: Conjunctivae normal.  Cardiovascular:     Rate and Rhythm: Normal rate and regular rhythm.  Pulmonary:     Effort: Pulmonary effort is normal. No respiratory distress.     Breath sounds: Normal breath sounds.  Chest:     Chest wall: No tenderness.  Abdominal:     Palpations:  Abdomen is soft.     Tenderness: There is no abdominal tenderness.  Musculoskeletal:        General: No swelling.     Cervical back: Neck supple.     Right lower leg: No edema.     Left lower leg: No edema.  Skin:    General: Skin is warm and dry.     Capillary Refill: Capillary refill takes less than 2 seconds.  Neurological:     Mental Status: She is alert.  Psychiatric:        Mood and Affect: Mood normal.     ED Results / Procedures / Treatments   Labs (all labs ordered are listed, but only abnormal results are displayed) Labs Reviewed  BASIC METABOLIC PANEL - Abnormal; Notable for the following components:      Result Value   Glucose, Bld 119 (*)    BUN 28 (*)    Creatinine, Ser 1.28 (*)    GFR, Estimated 45 (*)    All other components within normal limits  CBC - Abnormal; Notable for the following components:   Platelets 129 (*)    All other components within normal limits  TROPONIN I (HIGH SENSITIVITY)  TROPONIN I (HIGH SENSITIVITY)    EKG None  Radiology DG Chest 2 View Result Date: 07/22/2023 CLINICAL DATA:  Chest pain EXAM: CHEST - 2 VIEW COMPARISON:  08/25/2021 FINDINGS: The heart size and mediastinal contours are within normal limits. Both lungs are clear. The visualized skeletal structures are unremarkable. IMPRESSION: No active cardiopulmonary disease. Electronically Signed   By: Charlett Nose M.D.   On: 07/22/2023 19:32    Procedures Procedures  {Document cardiac monitor, telemetry assessment procedure when appropriate:1}  Medications Ordered in ED Medications - No data to display  ED Course/ Medical Decision Making/ A&P   {   Click here for ABCD2, HEART and other calculatorsREFRESH Note before signing :1}                              Medical Decision Making Amount and/or Complexity of Data Reviewed Labs: ordered. Radiology: ordered.   This patient presents to the ED for concern of chest pain, this involves an extensive number of treatment  options, and is a complaint that carries with it a high risk of complications and morbidity.  The differential diagnosis includes ACS, pneumonia, musculoskeletal pain, anxiety, GERD.  Less likely differentials would include pulmonary embolism   Co morbidities that complicate the patient evaluation  GERD, type II DM, hypertension   Additional history obtained:   External records from outside source obtained and reviewed including primary care notes   Lab Tests:  I Ordered, and personally interpreted labs.  The pertinent results include: Initial troponin 2 repeat of 3, mildly  elevated creatinine at 1.28 with elevated BUN 28   Imaging Studies ordered:  I ordered imaging studies including chest x-ray I independently visualized and interpreted imaging which showed no active disease I agree with the radiologist interpretation   Cardiac Monitoring: / EKG:  The patient was maintained on a cardiac monitor.  I personally viewed and interpreted the cardiac monitored which showed an underlying rhythm of: sinus rhythm   Social Determinants of Health:  Patient is a former smoker   Test / Admission - Considered:  Patient with negative troponins x 2, nonischemic EKG.  No current chest pain.  I see no indication for further emergent chest pain workup at this time.  Patient may follow-up with her primary care team for further evaluation.  She does have mildly elevated creatinine and BUN showing possible dehydration.  Will recommend gentle rehydration at home.  Patient should follow-up in 1 week with her primary care team for repeat BMP for reevaluation.  Patient comfortable discharge plan.  Discharge home.   {Document critical care time when appropriate:1} {Document review of labs and clinical decision tools ie heart score, Chads2Vasc2 etc:1}  {Document your independent review of radiology images, and any outside records:1} {Document your discussion with family members, caretakers, and with  consultants:1} {Document social determinants of health affecting pt's care:1} {Document your decision making why or why not admission, treatments were needed:1} Final Clinical Impression(s) / ED Diagnoses Final diagnoses:  None    Rx / DC Orders ED Discharge Orders     None

## 2023-07-23 ENCOUNTER — Ambulatory Visit: Payer: Medicare HMO | Admitting: Hematology and Oncology

## 2023-08-02 ENCOUNTER — Other Ambulatory Visit: Payer: Self-pay | Admitting: Family Medicine

## 2023-08-02 DIAGNOSIS — R202 Paresthesia of skin: Secondary | ICD-10-CM

## 2023-10-10 ENCOUNTER — Telehealth: Payer: Self-pay

## 2023-10-10 NOTE — Telephone Encounter (Signed)
 Verbally confirmed appt for 6/9

## 2023-10-14 ENCOUNTER — Inpatient Hospital Stay: Payer: Medicare HMO | Attending: Hematology and Oncology | Admitting: Hematology and Oncology

## 2023-10-14 VITALS — BP 125/68 | HR 70 | Temp 97.4°F | Resp 16 | Wt 174.1 lb

## 2023-10-14 DIAGNOSIS — Z87891 Personal history of nicotine dependence: Secondary | ICD-10-CM | POA: Diagnosis not present

## 2023-10-14 DIAGNOSIS — N6092 Unspecified benign mammary dysplasia of left breast: Secondary | ICD-10-CM | POA: Insufficient documentation

## 2023-10-14 DIAGNOSIS — Z9189 Other specified personal risk factors, not elsewhere classified: Secondary | ICD-10-CM

## 2023-10-14 NOTE — Progress Notes (Signed)
 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   She is doing quite well, denies any major complaints. No breast changes noted on self exam On PE today, no palpable masses or regional adenopathy. Mammogram due Dec 2025, ordered. Encouraged exercise, 30 min a day 5 days a week. She will RTC in 6 months or sooner as needed.  HISTORY OF PRESENTING ILLNESS:   Connie Branch 73 y.o. female is here because of atypical lobular hyperplasia.  Oncology History   No history exists.   History of Present Illness    Connie Branch is here for follow-up.  Since her last visit here, she has been doing well.  She had her epigastric hernia removed and she feels much better.  She denies any changes in her breast.  She has been walking and trying to stay active.  Her last mammogram was in December 2024 which did not show any evidence of malignancy in bilateral breast.  She declined antiestrogen therapy in the past. Rest of the pertinent 10 point ROS reviewed and negative.  MEDICAL HISTORY:  Past Medical History:  Diagnosis Date   Anxiety    panic attacks   ARF (acute renal failure) secondary to dehydration from osmotic diuresis 05/21/2013   KIDNEY FUNCTION NORMAL NOW   Arthritis    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    Cataracts, bilateral    DDD (degenerative disc disease)    DKA (diabetic ketoacidoses) 05/21/2013   TYPE 2   GERD (gastroesophageal reflux disease)    Headache    SINUS   High cholesterol    History of claustrophobia    Hypertension    Neuropathy 2015   feet   Obesity    Osteopenia    PONV  (postoperative nausea and vomiting)     SURGICAL HISTORY: Past Surgical History:  Procedure Laterality Date   ABDOMINAL HYSTERECTOMY     COMPLETE   BREAST EXCISIONAL BIOPSY Left 04/20/2020   ALH   BREAST LUMPECTOMY WITH RADIOACTIVE SEED LOCALIZATION Left 01/04/2021   Procedure: LEFT BREAST LUMPECTOMY WITH RADIOACTIVE SEED LOCALIZATION X 3;  Surgeon: Caralyn Chandler, MD;  Location: [facility];  Service: General;  Laterality: Left;   BREAST SURGERY Left 04/20/2020   BX   CATARACT EXTRACTION, BILATERAL     EPIGASTRIC HERNIA REPAIR N/A 05/17/2023   Procedure: OPEN EPIGASTRIC HERNIA REPAIR;  Surgeon: Dorrie Gaudier Alphonso Aschoff, MD;  Location: [facility];  Service: General;  Laterality: N/A;   JOINT REPLACEMENT     KNEE JOINT MANIPULATION Right    OVARIAN CYST AND OVARY REMOVED  YRS AGO   TOTAL KNEE ARTHROPLASTY Right 06/21/2017   Procedure: RIGHT TOTAL KNEE ARTHROPLASTY;  Surgeon: Neil Balls, MD;  Location: [facility];  Service: Orthopedics;  Laterality: Right;   TOTAL KNEE ARTHROPLASTY Left 07/18/2020   Procedure: TOTAL KNEE ARTHROPLASTY;  Surgeon: Liliane Rei, MD;  Location: [facility];  Service: Orthopedics;  Laterality: Left;    TOTAL KNEE REVISION Right 07/22/2019   Procedure: Right knee polyethylene exchange;  Surgeon: Liliane Rei, MD;  Location: [facility];  Service: Orthopedics;  Laterality: Right;     SOCIAL HISTORY:  Social History   Socioeconomic History   Marital status: Single    Spouse name: Not on file   Number of children: 0   Years of education: Not on file   Highest education level: Not on file  Occupational History   Occupation: Disabled.  Tobacco Use   Smoking status: Former    Current packs/day: 0.00    Average packs/day: 0.3 packs/day for 50.0 years (12.5 ttl pk-yrs)    Types: Cigarettes    Start date: 05/08/1963    Quit date: 05/07/2013    Years since quitting: 10.4    Passive exposure: Past   Smokeless tobacco: Never  Vaping Use   Vaping  status: Never Used  Substance and Sexual Activity   Alcohol use: Not Currently    Comment: rare   Drug use: No   Sexual activity: Not Currently    Birth control/protection: Surgical  Other Topics Concern   Not on file  Social History Narrative   Single.  Lives with family.   Social Drivers of Corporate investment banker Strain: Not on file  Food Insecurity: Not on file  Transportation Needs: Not on file  Physical Activity: Not on file  Stress: Not on file  Social Connections: Not on file  Intimate Partner Violence: Not on file    FAMILY HISTORY: Family History  Problem Relation Age of Onset   Breast cancer Neg Hx     ALLERGIES:  is allergic to erythromycin, aspirin, and penicillins.  MEDICATIONS:  Current Outpatient Medications  Medication Sig Dispense Refill   acetaminophen  (TYLENOL ) 500 MG tablet Take 1,500 mg by mouth every 6 (six) hours as needed (pain.).     Ascorbic Acid  (VITAMIN C) 1000 MG tablet Take 1,000 mg by mouth in the morning.     Blood Glucose Monitoring Suppl (BLOOD GLUCOSE METER) kit Use as instructed 1 each 0   Cholecalciferol (VITAMIN D-3 PO) Take 2,000 Units by mouth in the morning.     hydrochlorothiazide  (HYDRODIURIL ) 25 MG tablet Take 1 tablet (25 mg total) by mouth daily.     ibuprofen  (ADVIL ) 800 MG tablet Take 1 tablet (800 mg total) by mouth every 8 (eight) hours as needed. 30 tablet 0   KLOR-CON  M20 20 MEQ tablet Take 20 mEq by mouth in the morning.     losartan  (COZAAR ) 100 MG tablet Take 100 mg by mouth in the morning.     metFORMIN  (GLUCOPHAGE ) 500 MG tablet Take 500 mg by mouth in the morning.  0   omeprazole (PRILOSEC) 40 MG capsule Take 40 mg by mouth in the morning.  1   oxyCODONE  (OXY IR/ROXICODONE ) 5 MG immediate release tablet Take 1 tablet (5 mg total) by mouth every 6 (six) hours as needed for severe pain (pain score 7-10). 15 tablet 0   simvastatin  (ZOCOR ) 80 MG tablet Take 80 mg by mouth in the morning.  3   No current  facility-administered medications for this visit.    PHYSICAL EXAMINATION:  ECOG PERFORMANCE STATUS: 0 - Asymptomatic  Vitals:   10/14/23 1213  BP: (!) 144/62  Pulse: 70  Resp: 16  Temp: (!) 97.4 F (36.3 C)  SpO2: 100%     Physical Exam Constitutional:      Appearance: Normal appearance.  Cardiovascular:     Rate and Rhythm: Normal rate and regular rhythm.     Pulses: Normal pulses.     Heart sounds: Normal heart sounds.  Pulmonary:     Effort: Pulmonary effort  is normal.     Breath sounds: Normal breath sounds.  Chest:     Comments: Bilateral breasts examined. No palpable masses or regional adenopathy. Musculoskeletal:        General: No swelling.     Cervical back: Normal range of motion and neck supple. No rigidity.     Right lower leg: No edema.     Left lower leg: No edema.  Skin:    General: Skin is warm and dry.  Neurological:     General: No focal deficit present.     Mental Status: She is alert.  Psychiatric:        Mood and Affect: Mood normal.      LABORATORY DATA:  I have reviewed the data as listed Lab Results  Component Value Date   WBC 6.7 07/22/2023   HGB 12.0 07/22/2023   HCT 36.5 07/22/2023   MCV 90.3 07/22/2023   PLT 129 (L) 07/22/2023     Chemistry      Component Value Date/Time   NA 137 07/22/2023 1824   K 3.9 07/22/2023 1824   CL 102 07/22/2023 1824   CO2 26 07/22/2023 1824   BUN 28 (H) 07/22/2023 1824   CREATININE 1.28 (H) 07/22/2023 1824      Component Value Date/Time   CALCIUM 10.0 07/22/2023 1824   ALKPHOS 68 04/10/2023 1505   AST 20 04/10/2023 1505   ALT 19 04/10/2023 1505   BILITOT 0.7 04/10/2023 1505     FINAL MICROSCOPIC DIAGNOSIS:   A. BREAST, LEFT UPPER OUTER, LUMPECTOMY:  -  Lobular neoplasia (atypical lobular hyperplasia)  -  Adenosis and fibroadenomatoid changes  -  Periductular chronic inflammation  -  Previous biopsy site changes  -  No malignancy identified  -  See comment   B. BREAST, LEFT  MEDIAL, LUMPECTOMY:  -  Lobular neoplasia (atypical lobular hyperplasia)  -  No malignancy identified   C. BREAST, LEFT ADDITIONAL DEEP MARGIN, EXCISION:  -  Lobular neoplasia (atypical lobular hyperplasia)  -  Fibroadenomatoid and fibrocystic changes with calcifications  -  No malignancy identified  -  See comment   RADIOGRAPHIC STUDIES: I have personally reviewed the radiological images as listed and agreed with the findings in the report. No results found.    All questions were answered. The patient knows to call the clinic with any problems, questions or concerns. I spent 20 minutes in the care of this patient including history, physical, review of records, counseling and coordination of care    Murleen Arms, MD 10/14/2023 12:14 PM

## 2024-03-10 ENCOUNTER — Other Ambulatory Visit: Payer: Self-pay | Admitting: Physical Medicine and Rehabilitation

## 2024-03-10 DIAGNOSIS — M5416 Radiculopathy, lumbar region: Secondary | ICD-10-CM

## 2024-03-23 ENCOUNTER — Telehealth: Payer: Self-pay

## 2024-03-23 NOTE — Progress Notes (Signed)
 See telephone note

## 2024-03-23 NOTE — Discharge Instructions (Signed)

## 2024-03-24 ENCOUNTER — Ambulatory Visit
Admission: RE | Admit: 2024-03-24 | Discharge: 2024-03-24 | Disposition: A | Discharge status: Home / Self Care | Source: Ambulatory Visit | Attending: Physical Medicine and Rehabilitation | Admitting: Physical Medicine and Rehabilitation

## 2024-03-24 DIAGNOSIS — M5416 Radiculopathy, lumbar region: Secondary | ICD-10-CM

## 2024-03-24 MED ORDER — MEPERIDINE HCL 50 MG/ML IJ SOLN: 50.0000 mg | Freq: Once | INTRAMUSCULAR | Status: DC | PRN

## 2024-03-24 MED ORDER — ONDANSETRON HCL 4 MG/2ML IJ SOLN: 4.0000 mg | Freq: Once | INTRAMUSCULAR | Status: DC | PRN

## 2024-03-24 MED ORDER — DIAZEPAM 5 MG PO TABS: 5.0000 mg | ORAL_TABLET | Freq: Once | ORAL | Status: DC

## 2024-03-24 MED ORDER — IOPAMIDOL (ISOVUE-M 200) INJECTION 41%
15.0000 mL | Freq: Once | INTRAMUSCULAR | Status: AC
  Administered 2024-03-24: 15 mL via INTRATHECAL

## 2024-04-20 ENCOUNTER — Telehealth: Payer: Self-pay | Admitting: Hematology and Oncology

## 2024-04-20 NOTE — Telephone Encounter (Signed)
 Unable to reach patient to reschedule. Resceduled from 05/11/24 to 05/14/24. Maled printed reminder of appt.

## 2024-05-01 ENCOUNTER — Ambulatory Visit
Admission: RE | Admit: 2024-05-01 | Discharge: 2024-05-01 | Disposition: A | Discharge status: Home / Self Care | Source: Ambulatory Visit | Attending: Hematology and Oncology | Admitting: Hematology and Oncology

## 2024-05-01 ENCOUNTER — Other Ambulatory Visit: Payer: Self-pay | Admitting: Hematology and Oncology

## 2024-05-01 DIAGNOSIS — Z9189 Other specified personal risk factors, not elsewhere classified: Secondary | ICD-10-CM

## 2024-05-01 DIAGNOSIS — N6310 Unspecified lump in the right breast, unspecified quadrant: Secondary | ICD-10-CM

## 2024-05-01 DIAGNOSIS — N6092 Unspecified benign mammary dysplasia of left breast: Secondary | ICD-10-CM

## 2024-05-11 ENCOUNTER — Other Ambulatory Visit

## 2024-05-11 ENCOUNTER — Ambulatory Visit: Admitting: Hematology and Oncology

## 2024-05-11 DIAGNOSIS — Z9189 Other specified personal risk factors, not elsewhere classified: Secondary | ICD-10-CM

## 2024-05-11 DIAGNOSIS — N6310 Unspecified lump in the right breast, unspecified quadrant: Secondary | ICD-10-CM

## 2024-05-11 DIAGNOSIS — N6092 Unspecified benign mammary dysplasia of left breast: Secondary | ICD-10-CM

## 2024-05-13 ENCOUNTER — Other Ambulatory Visit: Payer: Self-pay | Admitting: Hematology and Oncology

## 2024-05-13 DIAGNOSIS — Z9189 Other specified personal risk factors, not elsewhere classified: Secondary | ICD-10-CM

## 2024-05-13 DIAGNOSIS — N6092 Unspecified benign mammary dysplasia of left breast: Secondary | ICD-10-CM

## 2024-05-13 NOTE — Progress Notes (Signed)
 6 month follow up right breast US  ordered.  Connie Branch

## 2024-05-14 ENCOUNTER — Inpatient Hospital Stay: Attending: Hematology and Oncology | Admitting: Hematology and Oncology

## 2024-05-14 ENCOUNTER — Encounter: Payer: Self-pay | Admitting: Hematology and Oncology

## 2024-05-14 VITALS — BP 138/62 | HR 68 | Temp 97.8°F | Resp 17 | Wt 174.0 lb

## 2024-05-14 DIAGNOSIS — Z9189 Other specified personal risk factors, not elsewhere classified: Secondary | ICD-10-CM | POA: Diagnosis not present

## 2024-05-14 NOTE — Progress Notes (Signed)
 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   She is doing quite well, denies any major complaints. No breast changes noted on self exam On PE today, no palpable masses or regional adenopathy. Mammogram due Dec 2025, ordered. Encouraged exercise, 30 min a day 5 days a week. She will RTC in 6 months or sooner as needed.  HISTORY OF PRESENTING ILLNESS:   Connie Branch 74 y.o. female is here because of atypical lobular hyperplasia.  Oncology History   No problem history exists.   History of Present Illness    Connie Branch is here for follow-up.  Since her last visit here, she has been doing well.  She had her epigastric hernia removed and she feels much better.  She denies any changes in her breast.  She has been walking and trying to stay active.  Her last mammogram was in December 2024 which did not show any evidence of malignancy in bilateral breast.  She declined antiestrogen therapy in the past. Rest of the pertinent 10 point ROS reviewed and negative.  MEDICAL HISTORY:  Past Medical History:  Diagnosis Date   Anxiety    panic attacks   ARF (acute renal failure) secondary to dehydration from osmotic diuresis 05/21/2013   KIDNEY FUNCTION NORMAL NOW   Arthritis    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    Cataracts, bilateral    DDD (degenerative disc disease)    DKA (diabetic ketoacidoses) 05/21/2013   TYPE 2   GERD (gastroesophageal reflux disease)    Headache    SINUS   High cholesterol    History of claustrophobia    Hypertension    Neuropathy 2015   feet   Obesity    Osteopenia     PONV (postoperative nausea and vomiting)     SURGICAL HISTORY: Past Surgical History:  Procedure Laterality Date   ABDOMINAL HYSTERECTOMY     COMPLETE   BREAST EXCISIONAL BIOPSY Left 04/20/2020   ALH   BREAST LUMPECTOMY WITH RADIOACTIVE SEED LOCALIZATION Left 01/04/2021   Procedure: LEFT BREAST LUMPECTOMY WITH RADIOACTIVE SEED LOCALIZATION X 3;  Surgeon: Curvin Deward MOULD, MD;  Location: [facility];  Service: General;  Laterality: Left;   BREAST SURGERY Left 04/20/2020   BX   CATARACT EXTRACTION, BILATERAL     EPIGASTRIC HERNIA REPAIR N/A 05/17/2023   Procedure: OPEN EPIGASTRIC HERNIA REPAIR;  Surgeon: Stevie Herlene Righter, MD;  Location: [facility];  Service: General;  Laterality: N/A;   JOINT REPLACEMENT     KNEE JOINT MANIPULATION Right    OVARIAN CYST AND OVARY REMOVED  YRS AGO   TOTAL KNEE ARTHROPLASTY Right 06/21/2017   Procedure: RIGHT TOTAL KNEE ARTHROPLASTY;  Surgeon: Yvone Rush, MD;  Location: [facility];  Service: Orthopedics;  Laterality: Right;   TOTAL KNEE ARTHROPLASTY Left 07/18/2020   Procedure: TOTAL KNEE ARTHROPLASTY;  Surgeon: Melodi Lerner, MD;  Location: [facility];  Service: Orthopedics;  Laterality: Left;    TOTAL KNEE REVISION Right 07/22/2019   Procedure: Right knee polyethylene exchange;  Surgeon: Melodi Lerner, MD;  Location: [facility];  Service: Orthopedics;  Laterality: Right;   SOCIAL HISTORY: Social History   Socioeconomic History   Marital status: Single    Spouse name: Not on file   Number of children: 0   Years of education: Not on file   Highest education level: Not on file  Occupational History   Occupation: Disabled.  Tobacco Use   Smoking status: Former    Current packs/day: 0.00    Average packs/day: 0.3 packs/day for 50.0 years (12.5 ttl pk-yrs)    Types: Cigarettes    Start date: 05/08/1963    Quit date: 05/07/2013    Years since quitting: 11.0    Passive exposure: Past   Smokeless tobacco: Never  Vaping Use    Vaping status: Never Used  Substance and Sexual Activity   Alcohol use: Not Currently    Comment: rare   Drug use: No   Sexual activity: Not Currently    Birth control/protection: Surgical  Other Topics Concern   Not on file  Social History Narrative   Single.  Lives with family.   Social Drivers of Health   Tobacco Use: Medium Risk (08/09/2023)   Received from [facility]   Patient History    Smoking Tobacco Use: Former    Smokeless Tobacco Use: Never    Passive Exposure: Not on file  Financial Resource Strain: Not on file  Food Insecurity: No Food Insecurity (05/14/2024)   Epic    Worried About Programme Researcher, Broadcasting/film/video in the Last Year: Never true    Ran Out of Food in the Last Year: Never true  Transportation Needs: No Transportation Needs (05/14/2024)   Epic    Lack of Transportation (Medical): No    Lack of Transportation (Non-Medical): No  Physical Activity: Not on file  Stress: Not on file  Social Connections: Not on file  Intimate Partner Violence: Not At Risk (05/14/2024)   Epic    Fear of Current or Ex-Partner: No    Emotionally Abused: No    Physically Abused: No    Sexually Abused: No  Depression (PHQ2-9): Low Risk (05/14/2024)   Depression (PHQ2-9)    PHQ-2 Score: 0  Alcohol Screen: Not on file  Housing: Low Risk (05/14/2024)   Epic    Unable to Pay for Housing in the Last Year: No    Number of Times Moved in the Last Year: 0    Homeless in the Last Year: No  Utilities: Not At Risk (05/14/2024)   Epic    Threatened with loss of utilities: No  Health Literacy: Not on file    FAMILY HISTORY: Family History  Problem Relation Age of Onset   Breast cancer Neg Hx     ALLERGIES:  is allergic to erythromycin, aspirin, and penicillins.  MEDICATIONS:  Current Outpatient Medications  Medication Sig Dispense Refill   acetaminophen  (TYLENOL ) 500 MG tablet Take 1,500 mg by mouth every 6 (six) hours as needed (pain.).     Ascorbic Acid  (VITAMIN C) 1000 MG  tablet Take 1,000 mg by mouth in the morning.     Blood Glucose Monitoring Suppl (BLOOD GLUCOSE METER) kit Use as instructed 1 each 0   Cholecalciferol (VITAMIN D-3 PO) Take 2,000 Units by mouth in the morning.     hydrochlorothiazide  (HYDRODIURIL ) 25 MG tablet Take 1 tablet (25 mg total) by mouth daily.     KLOR-CON  M20 20 MEQ tablet Take 20 mEq by mouth in the morning.     losartan  (COZAAR ) 100 MG tablet Take 100 mg by mouth in  the morning.     metFORMIN  (GLUCOPHAGE ) 500 MG tablet Take 500 mg by mouth in the morning.  0   omeprazole (PRILOSEC) 40 MG capsule Take 40 mg by mouth in the morning.  1   simvastatin  (ZOCOR ) 80 MG tablet Take 80 mg by mouth in the morning.  3   No current facility-administered medications for this visit.    PHYSICAL EXAMINATION:  ECOG PERFORMANCE STATUS: 0 - Asymptomatic  Vitals:   05/14/24 1019  BP: 138/62  Pulse: 68  Resp: 17  Temp: 97.8 F (36.6 C)  SpO2: 97%     Physical Exam Constitutional:      Appearance: Normal appearance.  Cardiovascular:     Rate and Rhythm: Normal rate and regular rhythm.     Pulses: Normal pulses.     Heart sounds: Normal heart sounds.  Pulmonary:     Effort: Pulmonary effort is normal.     Breath sounds: Normal breath sounds.  Chest:     Comments: Bilateral breasts examined. No palpable masses or regional adenopathy. Musculoskeletal:        General: No swelling.     Cervical back: Normal range of motion and neck supple. No rigidity.     Right lower leg: No edema.     Left lower leg: No edema.  Skin:    General: Skin is warm and dry.  Neurological:     General: No focal deficit present.     Mental Status: She is alert.  Psychiatric:        Mood and Affect: Mood normal.      LABORATORY DATA:  I have reviewed the data as listed Lab Results  Component Value Date   WBC 6.7 07/22/2023   HGB 12.0 07/22/2023   HCT 36.5 07/22/2023   MCV 90.3 07/22/2023   PLT 129 (L) 07/22/2023     Chemistry       Component Value Date/Time   NA 137 07/22/2023 1824   K 3.9 07/22/2023 1824   CL 102 07/22/2023 1824   CO2 26 07/22/2023 1824   BUN 28 (H) 07/22/2023 1824   CREATININE 1.28 (H) 07/22/2023 1824      Component Value Date/Time   CALCIUM 10.0 07/22/2023 1824   ALKPHOS 68 04/10/2023 1505   AST 20 04/10/2023 1505   ALT 19 04/10/2023 1505   BILITOT 0.7 04/10/2023 1505     FINAL MICROSCOPIC DIAGNOSIS:   A. BREAST, LEFT UPPER OUTER, LUMPECTOMY:  -  Lobular neoplasia (atypical lobular hyperplasia)  -  Adenosis and fibroadenomatoid changes  -  Periductular chronic inflammation  -  Previous biopsy site changes  -  No malignancy identified  -  See comment   B. BREAST, LEFT MEDIAL, LUMPECTOMY:  -  Lobular neoplasia (atypical lobular hyperplasia)  -  No malignancy identified   C. BREAST, LEFT ADDITIONAL DEEP MARGIN, EXCISION:  -  Lobular neoplasia (atypical lobular hyperplasia)  -  Fibroadenomatoid and fibrocystic changes with calcifications  -  No malignancy identified  -  See comment   RADIOGRAPHIC STUDIES: I have personally reviewed the radiological images as listed and agreed with the findings in the report. US  BREAST ASPIRATION RIGHT Result Date: 05/11/2024 CLINICAL DATA:  Indeterminate complicated versus complex cystic mass EXAM: ULTRASOUND GUIDED RIGHT BREAST CYST ASPIRATION COMPARISON:  Previous exam(s). PROCEDURE: The patient and I discussed the procedure of ultrasound-guided aspiration including benefits and alternatives. We discussed the high likelihood of a successful procedure. We discussed the risks of the procedure including infection,  bleeding, tissue injury, and inadequate sampling. Informed written consent was given. The usual time out protocol was performed immediately prior to the procedure. Using sterile technique, 1% lidocaine , under direct ultrasound visualization, needle aspiration of a mass at 4 o'clock 2 cm from the nipple was performed. Aspirated fluid has a benign  appearance so was discarded. Mass aspirated to completion, consistent with a benign cyst. IMPRESSION: Ultrasound-guided aspiration of a RIGHT breast mass consistent with a benign cyst. No apparent complications. RECOMMENDATIONS: RIGHT breast ultrasound in 6 months to confirm stability of multiple additional oval circumscribed masses in patient with history of multiple bilateral oval circumscribed masses. Electronically Signed   By: Corean Salter M.D.   On: 05/11/2024 13:14   MM DIAG BREAST TOMO BILATERAL Result Date: 05/01/2024 CLINICAL DATA:  Patient with history of atypical lobular hyperplasia in 2022 status post excisional biopsy. EXAM: DIGITAL DIAGNOSTIC BILATERAL MAMMOGRAM WITH TOMOSYNTHESIS AND CAD; ULTRASOUND RIGHT BREAST LIMITED TECHNIQUE: Bilateral digital diagnostic mammography and breast tomosynthesis was performed. The images were evaluated with computer-aided detection. ; Targeted ultrasound examination of the right breast was performed COMPARISON:  Previous exam(s). ACR Breast Density Category c: The breasts are heterogeneously dense, which may obscure small masses. FINDINGS: There is density and architectural distortion within the LEFT breast, consistent with postsurgical changes. These are stable in comparison to prior. No suspicious mass, distortion, or microcalcifications are identified to suggest presence of malignancy in the LEFT breast. A questioned asymmetry in the RIGHT breast resolved with additional views, most consistent with overlapping tissue. An oval mass is noted in the RIGHT lower inner breast at anterior depth (CC slice 49). This is not definitively stable compared to more remote prior mammograms. Several oval circumscribed masses are noted in the RIGHT outer breast on the rolled CC medial view (rolled CC medial image 19 for example). This is favored stable compared to prior in patient with history of LEFT-sided fibroadenomas but more conspicuous due to difference in  technique. On physical exam, no suspicious mass is appreciated. Targeted ultrasound was performed of the RIGHT breast. At 4 o'clock 2 cm the nipple, there is an oval circumscribed mass. It contains a possible internal anechoic component as well as a hypoechoic component. This measures 8 x 6 x 5 mm and is favored to correspond to the oval mass noted mammographically. There are multiple oval circumscribed masses noted throughout the RIGHT breast. Adjacent to mass of mammographic concern at 4 o'clock 2 cm from the nipple is an oval circumscribed hypoechoic mass. This measures 5 x 3 by 5 mm. An additional oval circumscribed masses noted at 9 o'clock 10 cm from the nipple. This measures 7 x 2 x 5 mm. At 8 o'clock 3 cm from the nipple there is an additional oval circumscribed hypoechoic mass. This measures 9 x 4 x 9 mm. At 3 o'clock 7 cm from the nipple there is an oval circumscribed hypoechoic mass. It measures 5 x 3 x 6 mm. These are all morphologically similar compared to the previously followed oval circumscribed masses of the contralateral breast and likely reflect the same process. Targeted ultrasound was performed of the RIGHT axilla. No suspicious axillary lymph nodes are noted. IMPRESSION: 1. There is an 8 mm possible complicated versus complex cystic and solid mass in the RIGHT breast at 4 o'clock. Recommend ultrasound-guided aspiration with potential conversion to biopsy for definitive characterization. 2. 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 With benign aspiration/biopsy results, recommend single follow-up ultrasound to establish stability in 6 months. With confirmed stability at 6 months, these could be considered benign in a patient with multiple oval circumscribed bilateral benign masses. 3. No mammographic evidence of  malignancy in the LEFT breast. RECOMMENDATION: RIGHT breast ultrasound-guided aspiration with potential conversion to biopsy x1 With benign results, recommend single follow-up ultrasound of additional oval circumscribed RIGHT breast masses to confirm stability as these are morphologically similar compared to the contralateral benign oval circumscribed masses. I have discussed the findings and recommendations with the patient. The aspiration with potential conversion to biopsy procedure was discussed with the patient and questions were answered. Patient expressed their understanding of the recommendation. Patient will be scheduled for procedure at her earliest convenience by the schedulers. Ordering provider will be notified. If applicable, a reminder letter will be sent to the patient regarding the next appointment. BI-RADS CATEGORY  4: Suspicious. Electronically Signed   By: Corean Salter M.D.   On: 05/01/2024 13:01   US  LIMITED ULTRASOUND INCLUDING AXILLA RIGHT BREAST Result Date: 05/01/2024 CLINICAL DATA:  Patient with history of atypical lobular hyperplasia in 2022 status post excisional biopsy. EXAM: DIGITAL DIAGNOSTIC BILATERAL MAMMOGRAM WITH TOMOSYNTHESIS AND CAD; ULTRASOUND RIGHT BREAST LIMITED TECHNIQUE: Bilateral digital diagnostic mammography and breast tomosynthesis was performed. The images were evaluated with computer-aided detection. ; Targeted ultrasound examination of the right breast was performed COMPARISON:  Previous exam(s). ACR Breast Density Category c: The breasts are heterogeneously dense, which may obscure small masses. FINDINGS: There is density and architectural distortion within the LEFT breast, consistent with postsurgical changes. These are stable in comparison to prior. No suspicious mass, distortion, or microcalcifications are identified to suggest presence of malignancy in the LEFT breast. A questioned asymmetry in the RIGHT breast resolved with additional views, most  consistent with overlapping tissue. An oval mass is noted in the RIGHT lower inner breast at anterior depth (CC slice 49). This is not definitively stable compared to more remote prior mammograms. Several oval circumscribed masses are noted in the RIGHT outer breast on the rolled CC medial view (rolled CC medial image 19 for example). This is favored stable compared to prior in patient with history of LEFT-sided fibroadenomas but more conspicuous due to difference in technique. On physical exam, no suspicious mass is appreciated. Targeted ultrasound was performed of the RIGHT breast. At 4 o'clock 2 cm the nipple, there is an oval circumscribed mass. It contains a possible internal anechoic component as well as a hypoechoic component. This measures 8 x 6 x 5 mm and is favored to correspond to the oval mass noted mammographically. There are multiple oval circumscribed masses noted throughout the RIGHT breast. Adjacent to mass of mammographic concern at 4 o'clock 2 cm from the nipple is an oval circumscribed hypoechoic mass. This measures 5 x 3 by 5 mm. An additional oval circumscribed masses noted at 9 o'clock 10 cm from the nipple. This measures 7 x 2 x 5 mm. At 8 o'clock 3 cm from the nipple there is an additional oval circumscribed hypoechoic mass. This measures 9 x 4 x 9 mm. At 3 o'clock 7 cm from the nipple there is an oval circumscribed hypoechoic mass. It measures 5 x 3 x 6 mm. These are all morphologically similar compared to the previously followed oval circumscribed masses of the contralateral breast and likely reflect the same process. Targeted ultrasound was performed of the RIGHT axilla. No suspicious axillary  lymph nodes are noted. IMPRESSION: 1. There is an 8 mm possible complicated versus complex cystic and solid mass in the RIGHT breast at 4 o'clock. Recommend ultrasound-guided aspiration with potential conversion to biopsy for definitive characterization. 2. 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 With benign aspiration/biopsy results, recommend single follow-up ultrasound to establish stability in 6 months. With confirmed stability at 6 months, these could be considered benign in a patient with multiple oval circumscribed bilateral benign masses. 3. No mammographic evidence of malignancy in the LEFT breast. RECOMMENDATION: RIGHT breast ultrasound-guided aspiration with potential conversion to biopsy x1 With benign results, recommend single follow-up ultrasound of additional oval circumscribed RIGHT breast masses to confirm stability as these are morphologically similar compared to the contralateral benign oval circumscribed masses. I have discussed the findings and recommendations with the patient. The aspiration with potential conversion to biopsy procedure was discussed with the patient and questions were answered. Patient expressed their understanding of the recommendation. Patient will be scheduled for procedure at her earliest convenience by the schedulers. Ordering provider will be notified. If applicable, a reminder letter will be sent to the patient regarding the next appointment. BI-RADS CATEGORY  4: Suspicious. Electronically Signed   By: Corean Salter M.D.   On: 05/01/2024 13:01      All questions were answered. The patient knows to call the clinic with any problems, questions or concerns. I spent 20 minutes in the care of this patient including history, physical, review of records, counseling and coordination of care    Amber Stalls, MD 05/14/2024 10:21 AM "

## 2024-05-14 NOTE — Progress Notes (Signed)
 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   Since her last visit here, she had mammogram and US  which showed 8 mm possible complicated versus complex cystic and solid mass in the right breast at 4 0 clock. There are multiple additional oval circumscribed masses noted throughout the RIGHT breast which likely reflect benign fibroadenomas in patient with history of multiple similar-appearing masses in the contralateral breast.  Breast aspiration confirmed a benign cyst, radiology recommended follow up US  in 6 months. No concerns on exam today I will see her back in July after next breast US    HISTORY OF PRESENTING ILLNESS:   Connie Branch 74 y.o. female is here because of atypical lobular hyperplasia.  Oncology History   No problem history exists.   History of Present Illness    [name] is here for follow-up.   Discussed the use of AI scribe software for clinical note transcription with the patient, who gave verbal consent to proceed.  History of Present Illness Connie Branch is a 74 year old female with benign breast cysts and multiple fibroadenomas who presents for follow-up after recent right breast ultrasound.  She underwent a right breast ultrasound in late December, which demonstrated a simple benign cyst. Fluid was aspirated from the cyst, and no biopsy was performed. She was advised to repeat the ultrasound in six months to monitor for interval changes at the same location.  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 The left breast findings have remained stable since excision, and no further imaging is planned for the left side.  She notes intermittent breast tenderness and can palpate mobile masses, which she describes as 'the mouse in the breast.'    Rest of the pertinent 10 point ROS reviewed and negative.  MEDICAL HISTORY:  Past Medical History:  Diagnosis Date   Anxiety    panic attacks   ARF (acute renal failure) secondary to dehydration from osmotic diuresis 05/21/2013   KIDNEY FUNCTION NORMAL NOW   Arthritis    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    Cataracts, bilateral    DDD (degenerative disc disease)    DKA (diabetic ketoacidoses) 05/21/2013   TYPE 2   GERD (gastroesophageal reflux disease)    Headache    SINUS   High cholesterol    History of claustrophobia    Hypertension    Neuropathy 2015   feet   Obesity    Osteopenia    PONV (postoperative nausea and vomiting)     SURGICAL HISTORY: Past Surgical History:  Procedure Laterality Date   ABDOMINAL HYSTERECTOMY     COMPLETE   BREAST EXCISIONAL BIOPSY Left 04/20/2020   ALH   BREAST LUMPECTOMY WITH RADIOACTIVE SEED LOCALIZATION Left 01/04/2021   Procedure: LEFT BREAST LUMPECTOMY WITH RADIOACTIVE SEED LOCALIZATION X 3;  Surgeon: Curvin Deward MOULD, MD;  Location: [facility];  Service: General;  Laterality: Left;   BREAST SURGERY Left 04/20/2020   BX   CATARACT EXTRACTION, BILATERAL     EPIGASTRIC HERNIA REPAIR N/A 05/17/2023  Procedure: OPEN EPIGASTRIC HERNIA REPAIR;  Surgeon: Kinsinger, Herlene Righter, MD;  Location: [facility];  Service: General;  Laterality: N/A;   JOINT REPLACEMENT     KNEE JOINT MANIPULATION Right    OVARIAN CYST AND OVARY REMOVED  YRS AGO   TOTAL KNEE ARTHROPLASTY Right 06/21/2017   Procedure: RIGHT TOTAL KNEE ARTHROPLASTY;  Surgeon: Yvone Rush, MD;  Location: [facility];  Service: Orthopedics;  Laterality: Right;   TOTAL KNEE  ARTHROPLASTY Left 07/18/2020   Procedure: TOTAL KNEE ARTHROPLASTY;  Surgeon: Melodi Lerner, MD;  Location: [facility];  Service: Orthopedics;  Laterality: Left;    TOTAL KNEE REVISION Right 07/22/2019   Procedure: Right knee polyethylene exchange;  Surgeon: Melodi Lerner, MD;  Location: [facility];  Service: Orthopedics;  Laterality: Right;     SOCIAL HISTORY: Social History   Socioeconomic History   Marital status: Single    Spouse name: Not on file   Number of children: 0   Years of education: Not on file   Highest education level: Not on file  Occupational History   Occupation: Disabled.  Tobacco Use   Smoking status: Former    Current packs/day: 0.00    Average packs/day: 0.3 packs/day for 50.0 years (12.5 ttl pk-yrs)    Types: Cigarettes    Start date: 05/08/1963    Quit date: 05/07/2013    Years since quitting: 11.0    Passive exposure: Past   Smokeless tobacco: Never  Vaping Use   Vaping status: Never Used  Substance and Sexual Activity   Alcohol use: Not Currently    Comment: rare   Drug use: No   Sexual activity: Not Currently    Birth control/protection: Surgical  Other Topics Concern   Not on file  Social History Narrative   Single.  Lives with family.   Social Drivers of Health   Tobacco Use: Medium Risk (08/09/2023)   Received from [facility]   Patient History    Smoking Tobacco Use: Former    Smokeless Tobacco Use: Never    Passive Exposure: Not on file  Financial Resource Strain: Not on file  Food Insecurity: No Food Insecurity (05/14/2024)   Epic    Worried About Programme Researcher, Broadcasting/film/video in the Last Year: Never true    Ran Out of Food in the Last Year: Never true  Transportation Needs: No Transportation Needs (05/14/2024)   Epic    Lack of Transportation (Medical): No    Lack of Transportation (Non-Medical): No  Physical Activity: Not on file  Stress: Not on file  Social Connections: Not on file  Intimate Partner Violence: Not At  Risk (05/14/2024)   Epic    Fear of Current or Ex-Partner: No    Emotionally Abused: No    Physically Abused: No    Sexually Abused: No  Depression (PHQ2-9): Low Risk (05/14/2024)   Depression (PHQ2-9)    PHQ-2 Score: 0  Alcohol Screen: Not on file  Housing: Low Risk (05/14/2024)   Epic    Unable to Pay for Housing in the Last Year: No    Number of Times Moved in the Last Year: 0    Homeless in the Last Year: No  Utilities: Not At Risk (05/14/2024)   Epic    Threatened with loss of utilities: No  Health Literacy: Not on file    FAMILY HISTORY: Family History  Problem Relation Age of Onset   Breast cancer Neg Hx     ALLERGIES:  is  allergic to erythromycin, aspirin, and penicillins.  MEDICATIONS:  Current Outpatient Medications  Medication Sig Dispense Refill   acetaminophen  (TYLENOL ) 500 MG tablet Take 1,500 mg by mouth every 6 (six) hours as needed (pain.).     Ascorbic Acid  (VITAMIN C) 1000 MG tablet Take 1,000 mg by mouth in the morning.     Blood Glucose Monitoring Suppl (BLOOD GLUCOSE METER) kit Use as instructed 1 each 0   Cholecalciferol (VITAMIN D-3 PO) Take 2,000 Units by mouth in the morning.     hydrochlorothiazide  (HYDRODIURIL ) 25 MG tablet Take 1 tablet (25 mg total) by mouth daily.     KLOR-CON  M20 20 MEQ tablet Take 20 mEq by mouth in the morning.     losartan  (COZAAR ) 100 MG tablet Take 100 mg by mouth in the morning.     metFORMIN  (GLUCOPHAGE ) 500 MG tablet Take 500 mg by mouth in the morning.  0   omeprazole (PRILOSEC) 40 MG capsule Take 40 mg by mouth in the morning.  1   simvastatin  (ZOCOR ) 80 MG tablet Take 80 mg by mouth in the morning.  3   No current Branch-administered medications for this visit.    PHYSICAL EXAMINATION:  ECOG PERFORMANCE STATUS: 0 - Asymptomatic  Vitals:   05/14/24 1019  BP: 138/62  Pulse: 68  Resp: 17  Temp: 97.8 F (36.6 C)  SpO2: 97%     She appears well, no acute distress Palpable right breast lumps consistent with  fibroadenomas. NO regional adenopathy Left breast with no palpable masses. No regional adenopathy No LE edema.   LABORATORY DATA:  I have reviewed the data as listed Lab Results  Component Value Date   WBC 6.7 07/22/2023   HGB 12.0 07/22/2023   HCT 36.5 07/22/2023   MCV 90.3 07/22/2023   PLT 129 (L) 07/22/2023     Chemistry      Component Value Date/Time   NA 137 07/22/2023 1824   K 3.9 07/22/2023 1824   CL 102 07/22/2023 1824   CO2 26 07/22/2023 1824   BUN 28 (H) 07/22/2023 1824   CREATININE 1.28 (H) 07/22/2023 1824      Component Value Date/Time   CALCIUM 10.0 07/22/2023 1824   ALKPHOS 68 04/10/2023 1505   AST 20 04/10/2023 1505   ALT 19 04/10/2023 1505   BILITOT 0.7 04/10/2023 1505     FINAL MICROSCOPIC DIAGNOSIS:   A. BREAST, LEFT UPPER OUTER, LUMPECTOMY:  -  Lobular neoplasia (atypical lobular hyperplasia)  -  Adenosis and fibroadenomatoid changes  -  Periductular chronic inflammation  -  Previous biopsy site changes  -  No malignancy identified  -  See comment   B. BREAST, LEFT MEDIAL, LUMPECTOMY:  -  Lobular neoplasia (atypical lobular hyperplasia)  -  No malignancy identified   C. BREAST, LEFT ADDITIONAL DEEP MARGIN, EXCISION:  -  Lobular neoplasia (atypical lobular hyperplasia)  -  Fibroadenomatoid and fibrocystic changes with calcifications  -  No malignancy identified  -  See comment   RADIOGRAPHIC STUDIES: I have personally reviewed the radiological images as listed and agreed with the findings in the report. US  BREAST ASPIRATION RIGHT Result Date: 05/11/2024 CLINICAL DATA:  Indeterminate complicated versus complex cystic mass EXAM: ULTRASOUND GUIDED RIGHT BREAST CYST ASPIRATION COMPARISON:  Previous exam(s). PROCEDURE: The patient and I discussed the procedure of ultrasound-guided aspiration including benefits and alternatives. We discussed the high likelihood of a successful procedure. We discussed the risks of the procedure including infection,  bleeding, tissue injury,  and inadequate sampling. Informed written consent was given. The usual time out protocol was performed immediately prior to the procedure. Using sterile technique, 1% lidocaine , under direct ultrasound visualization, needle aspiration of a mass at 4 o'clock 2 cm from the nipple was performed. Aspirated fluid has a benign appearance so was discarded. Mass aspirated to completion, consistent with a benign cyst. IMPRESSION: Ultrasound-guided aspiration of a RIGHT breast mass consistent with a benign cyst. No apparent complications. RECOMMENDATIONS: RIGHT breast ultrasound in 6 months to confirm stability of multiple additional oval circumscribed masses in patient with history of multiple bilateral oval circumscribed masses. Electronically Signed   By: Corean Salter M.D.   On: 05/11/2024 13:14   MM DIAG BREAST TOMO BILATERAL Result Date: 05/01/2024 CLINICAL DATA:  Patient with history of atypical lobular hyperplasia in 2022 status post excisional biopsy. EXAM: DIGITAL DIAGNOSTIC BILATERAL MAMMOGRAM WITH TOMOSYNTHESIS AND CAD; ULTRASOUND RIGHT BREAST LIMITED TECHNIQUE: Bilateral digital diagnostic mammography and breast tomosynthesis was performed. The images were evaluated with computer-aided detection. ; Targeted ultrasound examination of the right breast was performed COMPARISON:  Previous exam(s). ACR Breast Density Category c: The breasts are heterogeneously dense, which may obscure small masses. FINDINGS: There is density and architectural distortion within the LEFT breast, consistent with postsurgical changes. These are stable in comparison to prior. No suspicious mass, distortion, or microcalcifications are identified to suggest presence of malignancy in the LEFT breast. A questioned asymmetry in the RIGHT breast resolved with additional views, most consistent with overlapping tissue. An oval mass is noted in the RIGHT lower inner breast at anterior depth (CC slice 49). This is  not definitively stable compared to more remote prior mammograms. Several oval circumscribed masses are noted in the RIGHT outer breast on the rolled CC medial view (rolled CC medial image 19 for example). This is favored stable compared to prior in patient with history of LEFT-sided fibroadenomas but more conspicuous due to difference in technique. On physical exam, no suspicious mass is appreciated. Targeted ultrasound was performed of the RIGHT breast. At 4 o'clock 2 cm the nipple, there is an oval circumscribed mass. It contains a possible internal anechoic component as well as a hypoechoic component. This measures 8 x 6 x 5 mm and is favored to correspond to the oval mass noted mammographically. There are multiple oval circumscribed masses noted throughout the RIGHT breast. Adjacent to mass of mammographic concern at 4 o'clock 2 cm from the nipple is an oval circumscribed hypoechoic mass. This measures 5 x 3 by 5 mm. An additional oval circumscribed masses noted at 9 o'clock 10 cm from the nipple. This measures 7 x 2 x 5 mm. At 8 o'clock 3 cm from the nipple there is an additional oval circumscribed hypoechoic mass. This measures 9 x 4 x 9 mm. At 3 o'clock 7 cm from the nipple there is an oval circumscribed hypoechoic mass. It measures 5 x 3 x 6 mm. These are all morphologically similar compared to the previously followed oval circumscribed masses of the contralateral breast and likely reflect the same process. Targeted ultrasound was performed of the RIGHT axilla. No suspicious axillary lymph nodes are noted. IMPRESSION: 1. There is an 8 mm possible complicated versus complex cystic and solid mass in the RIGHT breast at 4 o'clock. Recommend ultrasound-guided aspiration with potential conversion to biopsy for definitive characterization. 2. There are multiple additional oval circumscribed masses noted throughout the RIGHT breast which likely reflect benign fibroadenomas in patient with history of multiple  similar-appearing masses  in the contralateral breast. With benign aspiration/biopsy results, recommend single follow-up ultrasound to establish stability in 6 months. With confirmed stability at 6 months, these could be considered benign in a patient with multiple oval circumscribed bilateral benign masses. 3. No mammographic evidence of malignancy in the LEFT breast. RECOMMENDATION: RIGHT breast ultrasound-guided aspiration with potential conversion to biopsy x1 With benign results, recommend single follow-up ultrasound of additional oval circumscribed RIGHT breast masses to confirm stability as these are morphologically similar compared to the contralateral benign oval circumscribed masses. I have discussed the findings and recommendations with the patient. The aspiration with potential conversion to biopsy procedure was discussed with the patient and questions were answered. Patient expressed their understanding of the recommendation. Patient will be scheduled for procedure at her earliest convenience by the schedulers. Ordering provider will be notified. If applicable, a reminder letter will be sent to the patient regarding the next appointment. BI-RADS CATEGORY  4: Suspicious. Electronically Signed   By: Corean Salter M.D.   On: 05/01/2024 13:01   US  LIMITED ULTRASOUND INCLUDING AXILLA RIGHT BREAST Result Date: 05/01/2024 CLINICAL DATA:  Patient with history of atypical lobular hyperplasia in 2022 status post excisional biopsy. EXAM: DIGITAL DIAGNOSTIC BILATERAL MAMMOGRAM WITH TOMOSYNTHESIS AND CAD; ULTRASOUND RIGHT BREAST LIMITED TECHNIQUE: Bilateral digital diagnostic mammography and breast tomosynthesis was performed. The images were evaluated with computer-aided detection. ; Targeted ultrasound examination of the right breast was performed COMPARISON:  Previous exam(s). ACR Breast Density Category c: The breasts are heterogeneously dense, which may obscure small masses. FINDINGS: There is density  and architectural distortion within the LEFT breast, consistent with postsurgical changes. These are stable in comparison to prior. No suspicious mass, distortion, or microcalcifications are identified to suggest presence of malignancy in the LEFT breast. A questioned asymmetry in the RIGHT breast resolved with additional views, most consistent with overlapping tissue. An oval mass is noted in the RIGHT lower inner breast at anterior depth (CC slice 49). This is not definitively stable compared to more remote prior mammograms. Several oval circumscribed masses are noted in the RIGHT outer breast on the rolled CC medial view (rolled CC medial image 19 for example). This is favored stable compared to prior in patient with history of LEFT-sided fibroadenomas but more conspicuous due to difference in technique. On physical exam, no suspicious mass is appreciated. Targeted ultrasound was performed of the RIGHT breast. At 4 o'clock 2 cm the nipple, there is an oval circumscribed mass. It contains a possible internal anechoic component as well as a hypoechoic component. This measures 8 x 6 x 5 mm and is favored to correspond to the oval mass noted mammographically. There are multiple oval circumscribed masses noted throughout the RIGHT breast. Adjacent to mass of mammographic concern at 4 o'clock 2 cm from the nipple is an oval circumscribed hypoechoic mass. This measures 5 x 3 by 5 mm. An additional oval circumscribed masses noted at 9 o'clock 10 cm from the nipple. This measures 7 x 2 x 5 mm. At 8 o'clock 3 cm from the nipple there is an additional oval circumscribed hypoechoic mass. This measures 9 x 4 x 9 mm. At 3 o'clock 7 cm from the nipple there is an oval circumscribed hypoechoic mass. It measures 5 x 3 x 6 mm. These are all morphologically similar compared to the previously followed oval circumscribed masses of the contralateral breast and likely reflect the same process. Targeted ultrasound was performed of the  RIGHT axilla. No suspicious axillary lymph nodes are  noted. IMPRESSION: 1. There is an 8 mm possible complicated versus complex cystic and solid mass in the RIGHT breast at 4 o'clock. Recommend ultrasound-guided aspiration with potential conversion to biopsy for definitive characterization. 2. There are multiple additional oval circumscribed masses noted throughout the RIGHT breast which likely reflect benign fibroadenomas in patient with history of multiple similar-appearing masses in the contralateral breast. With benign aspiration/biopsy results, recommend single follow-up ultrasound to establish stability in 6 months. With confirmed stability at 6 months, these could be considered benign in a patient with multiple oval circumscribed bilateral benign masses. 3. No mammographic evidence of malignancy in the LEFT breast. RECOMMENDATION: RIGHT breast ultrasound-guided aspiration with potential conversion to biopsy x1 With benign results, recommend single follow-up ultrasound of additional oval circumscribed RIGHT breast masses to confirm stability as these are morphologically similar compared to the contralateral benign oval circumscribed masses. I have discussed the findings and recommendations with the patient. The aspiration with potential conversion to biopsy procedure was discussed with the patient and questions were answered. Patient expressed their understanding of the recommendation. Patient will be scheduled for procedure at her earliest convenience by the schedulers. Ordering provider will be notified. If applicable, a reminder letter will be sent to the patient regarding the next appointment. BI-RADS CATEGORY  4: Suspicious. Electronically Signed   By: Corean Salter M.D.   On: 05/01/2024 13:01      All questions were answered. The patient knows to call the clinic with any problems, questions or concerns. I spent 20 minutes in the care of this patient including history, physical, review of  records, counseling and coordination of care    Amber Stalls, MD 05/14/2024 10:23 AM "

## 2024-11-10 ENCOUNTER — Inpatient Hospital Stay: Admitting: Hematology and Oncology
